# Patient Record
Sex: Male | Born: 1950
Health system: Southern US, Community
[De-identification: ages and names within clinical notes are randomized; demographics above are authoritative.]

## PROBLEM LIST (undated history)

## (undated) DIAGNOSIS — F419 Anxiety disorder, unspecified: Secondary | ICD-10-CM

## (undated) DIAGNOSIS — M545 Low back pain, unspecified: Secondary | ICD-10-CM

## (undated) DIAGNOSIS — I447 Left bundle-branch block, unspecified: Secondary | ICD-10-CM

## (undated) DIAGNOSIS — B059 Measles without complication: Secondary | ICD-10-CM

## (undated) DIAGNOSIS — I2510 Atherosclerotic heart disease of native coronary artery without angina pectoris: Secondary | ICD-10-CM

## (undated) DIAGNOSIS — I219 Acute myocardial infarction, unspecified: Secondary | ICD-10-CM

## (undated) DIAGNOSIS — I4892 Unspecified atrial flutter: Secondary | ICD-10-CM

## (undated) DIAGNOSIS — Z720 Tobacco use: Secondary | ICD-10-CM

## (undated) DIAGNOSIS — M1990 Unspecified osteoarthritis, unspecified site: Secondary | ICD-10-CM

## (undated) DIAGNOSIS — B019 Varicella without complication: Secondary | ICD-10-CM

## (undated) DIAGNOSIS — Z0000 Encounter for general adult medical examination without abnormal findings: Principal | ICD-10-CM

## (undated) DIAGNOSIS — B269 Mumps without complication: Secondary | ICD-10-CM

## (undated) DIAGNOSIS — I5042 Chronic combined systolic (congestive) and diastolic (congestive) heart failure: Secondary | ICD-10-CM

## (undated) DIAGNOSIS — E785 Hyperlipidemia, unspecified: Secondary | ICD-10-CM

## (undated) DIAGNOSIS — I10 Essential (primary) hypertension: Secondary | ICD-10-CM

## (undated) DIAGNOSIS — E119 Type 2 diabetes mellitus without complications: Secondary | ICD-10-CM

## (undated) DIAGNOSIS — I509 Heart failure, unspecified: Secondary | ICD-10-CM

## (undated) DIAGNOSIS — R945 Abnormal results of liver function studies: Secondary | ICD-10-CM

## (undated) DIAGNOSIS — Z95810 Presence of automatic (implantable) cardiac defibrillator: Secondary | ICD-10-CM

## (undated) HISTORY — DX: Encounter for general adult medical examination without abnormal findings: Z00.00

## (undated) HISTORY — DX: Low back pain, unspecified: M54.50

## (undated) HISTORY — DX: Heart failure, unspecified: I50.9

## (undated) HISTORY — PX: TONSILLECTOMY: SUR1361

## (undated) HISTORY — DX: Hyperlipidemia, unspecified: E78.5

## (undated) HISTORY — DX: Chronic combined systolic (congestive) and diastolic (congestive) heart failure: I50.42

## (undated) HISTORY — DX: Unspecified atrial flutter: I48.92

## (undated) HISTORY — PX: COLONOSCOPY: SHX174

## (undated) HISTORY — DX: Tobacco use: Z72.0

## (undated) HISTORY — PX: SHOULDER SURGERY: SHX246

## (undated) HISTORY — DX: Atherosclerotic heart disease of native coronary artery without angina pectoris: I25.10

## (undated) HISTORY — DX: Anxiety disorder, unspecified: F41.9

## (undated) HISTORY — DX: Left bundle-branch block, unspecified: I44.7

## (undated) HISTORY — DX: Varicella without complication: B01.9

## (undated) HISTORY — DX: Mumps without complication: B26.9

## (undated) HISTORY — DX: Measles without complication: B05.9

## (undated) HISTORY — DX: Essential (primary) hypertension: I10

## (undated) HISTORY — DX: Abnormal results of liver function studies: R94.5

## (undated) HISTORY — DX: Low back pain: M54.5

---

## 2001-08-25 ENCOUNTER — Emergency Department (HOSPITAL_COMMUNITY): Admission: EM | Admit: 2001-08-25 | Discharge: 2001-08-25 | Payer: Self-pay | Admitting: Emergency Medicine

## 2003-01-23 ENCOUNTER — Ambulatory Visit (HOSPITAL_COMMUNITY): Admission: RE | Admit: 2003-01-23 | Discharge: 2003-01-23 | Payer: Self-pay | Admitting: Orthopedic Surgery

## 2003-05-23 ENCOUNTER — Inpatient Hospital Stay (HOSPITAL_COMMUNITY): Admission: EM | Admit: 2003-05-23 | Discharge: 2003-05-25 | Payer: Self-pay

## 2004-09-15 ENCOUNTER — Emergency Department (HOSPITAL_COMMUNITY): Admission: EM | Admit: 2004-09-15 | Discharge: 2004-09-15 | Payer: Self-pay | Admitting: Emergency Medicine

## 2004-11-25 ENCOUNTER — Ambulatory Visit: Payer: Self-pay | Admitting: Gastroenterology

## 2004-12-30 ENCOUNTER — Ambulatory Visit: Payer: Self-pay | Admitting: Gastroenterology

## 2004-12-30 ENCOUNTER — Encounter (INDEPENDENT_AMBULATORY_CARE_PROVIDER_SITE_OTHER): Payer: Self-pay | Admitting: Specialist

## 2005-06-18 ENCOUNTER — Ambulatory Visit: Payer: Self-pay | Admitting: Family Medicine

## 2006-01-13 LAB — HM COLONOSCOPY: HM Colonoscopy: NORMAL

## 2006-03-24 ENCOUNTER — Ambulatory Visit: Payer: Self-pay | Admitting: Family Medicine

## 2006-08-04 ENCOUNTER — Ambulatory Visit: Payer: Self-pay | Admitting: Family Medicine

## 2006-09-04 ENCOUNTER — Ambulatory Visit: Payer: Self-pay | Admitting: Family Medicine

## 2006-12-07 ENCOUNTER — Ambulatory Visit: Payer: Self-pay | Admitting: Family Medicine

## 2006-12-08 ENCOUNTER — Ambulatory Visit: Payer: Self-pay | Admitting: Cardiology

## 2006-12-09 ENCOUNTER — Ambulatory Visit: Payer: Self-pay

## 2006-12-21 ENCOUNTER — Ambulatory Visit: Payer: Self-pay | Admitting: Cardiology

## 2006-12-21 LAB — CONVERTED CEMR LAB
Calcium: 9.8 mg/dL (ref 8.4–10.5)
Chloride: 98 meq/L (ref 96–112)
Eosinophils Absolute: 0.3 10*3/uL (ref 0.0–0.6)
Eosinophils Relative: 2.5 % (ref 0.0–5.0)
GFR calc non Af Amer: 73 mL/min
Glucose, Bld: 154 mg/dL — ABNORMAL HIGH (ref 70–99)
INR: 1.1 — ABNORMAL HIGH (ref 0.8–1.0)
MCV: 87.4 fL (ref 78.0–100.0)
Platelets: 233 10*3/uL (ref 150–400)
Prothrombin Time: 12.6 s (ref 10.9–13.3)
RBC: 5.2 M/uL (ref 4.22–5.81)
RDW: 13 % (ref 11.5–14.6)
WBC: 11.4 10*3/uL — ABNORMAL HIGH (ref 4.5–10.5)
aPTT: 26.4 s (ref 21.7–29.8)

## 2006-12-24 ENCOUNTER — Inpatient Hospital Stay (HOSPITAL_BASED_OUTPATIENT_CLINIC_OR_DEPARTMENT_OTHER): Admission: RE | Admit: 2006-12-24 | Discharge: 2006-12-24 | Payer: Self-pay | Admitting: Cardiovascular Disease

## 2006-12-24 ENCOUNTER — Ambulatory Visit: Payer: Self-pay | Admitting: Cardiovascular Disease

## 2006-12-31 ENCOUNTER — Ambulatory Visit: Payer: Self-pay | Admitting: Cardiology

## 2007-01-05 ENCOUNTER — Ambulatory Visit: Payer: Self-pay | Admitting: Cardiology

## 2007-01-14 DIAGNOSIS — I219 Acute myocardial infarction, unspecified: Secondary | ICD-10-CM

## 2007-01-14 HISTORY — DX: Acute myocardial infarction, unspecified: I21.9

## 2007-01-14 HISTORY — PX: CORONARY ARTERY BYPASS GRAFT: SHX141

## 2007-01-14 HISTORY — PX: CARDIAC CATHETERIZATION: SHX172

## 2007-01-19 ENCOUNTER — Ambulatory Visit: Payer: Self-pay | Admitting: Thoracic Surgery (Cardiothoracic Vascular Surgery)

## 2007-01-22 ENCOUNTER — Ambulatory Visit (HOSPITAL_COMMUNITY)
Admission: RE | Admit: 2007-01-22 | Discharge: 2007-01-22 | Payer: Self-pay | Admitting: Thoracic Surgery (Cardiothoracic Vascular Surgery)

## 2007-01-22 ENCOUNTER — Encounter: Payer: Self-pay | Admitting: Thoracic Surgery (Cardiothoracic Vascular Surgery)

## 2007-01-26 ENCOUNTER — Ambulatory Visit: Payer: Self-pay | Admitting: Thoracic Surgery (Cardiothoracic Vascular Surgery)

## 2007-01-26 ENCOUNTER — Inpatient Hospital Stay (HOSPITAL_COMMUNITY)
Admission: RE | Admit: 2007-01-26 | Discharge: 2007-01-29 | Payer: Self-pay | Admitting: Thoracic Surgery (Cardiothoracic Vascular Surgery)

## 2007-02-15 ENCOUNTER — Encounter
Admission: RE | Admit: 2007-02-15 | Discharge: 2007-02-15 | Payer: Self-pay | Admitting: Thoracic Surgery (Cardiothoracic Vascular Surgery)

## 2007-02-15 ENCOUNTER — Ambulatory Visit: Payer: Self-pay | Admitting: Thoracic Surgery (Cardiothoracic Vascular Surgery)

## 2007-02-15 ENCOUNTER — Ambulatory Visit: Payer: Self-pay | Admitting: Cardiology

## 2007-03-25 ENCOUNTER — Ambulatory Visit: Payer: Self-pay | Admitting: Cardiology

## 2007-03-25 LAB — CONVERTED CEMR LAB
BUN: 10 mg/dL (ref 6–23)
Bilirubin, Direct: 0.1 mg/dL (ref 0.0–0.3)
CO2: 29 meq/L (ref 19–32)
Direct LDL: 56.8 mg/dL
GFR calc Af Amer: 111 mL/min
Glucose, Bld: 122 mg/dL — ABNORMAL HIGH (ref 70–99)
HDL: 30.3 mg/dL — ABNORMAL LOW (ref >39.0)
Potassium: 3.3 meq/L — ABNORMAL LOW (ref 3.5–5.1)
Total Protein: 6.5 g/dL (ref 6.0–8.3)
Triglycerides: 226 mg/dL (ref 0–149)

## 2007-03-26 ENCOUNTER — Ambulatory Visit: Payer: Self-pay | Admitting: Cardiology

## 2007-04-27 ENCOUNTER — Ambulatory Visit: Payer: Self-pay | Admitting: Thoracic Surgery (Cardiothoracic Vascular Surgery)

## 2007-05-10 ENCOUNTER — Ambulatory Visit: Payer: Self-pay | Admitting: Cardiology

## 2007-06-01 ENCOUNTER — Ambulatory Visit: Payer: Self-pay | Admitting: Family Medicine

## 2007-07-05 ENCOUNTER — Ambulatory Visit: Payer: Self-pay | Admitting: Family Medicine

## 2007-11-18 ENCOUNTER — Ambulatory Visit: Payer: Self-pay | Admitting: Family Medicine

## 2008-05-01 ENCOUNTER — Ambulatory Visit: Payer: Self-pay | Admitting: Family Medicine

## 2008-05-16 IMAGING — CT CT CHEST W/ CM
2 of 4 series · 15 of 36 positions shown, 18 images · IV contrast (Omnipaque 300)
Comparison: None.

CLINICAL DATA: Right upper lobe nodule.   
CHEST CT WITH CONTRAST:
TECHNIQUE: Multidetector CT imaging of the chest was performed following the standard protocol during bolus administration of intravenous contrast.
Contrast:  80 cc Omnipaque 300.

[Series 2: chest_routine 5.0 b40f st · axial · 0.78mm/px · z∈[-326,-52]mm · 12 of 66 slices shown, 15 images]
[im 6/66  mediastinal]
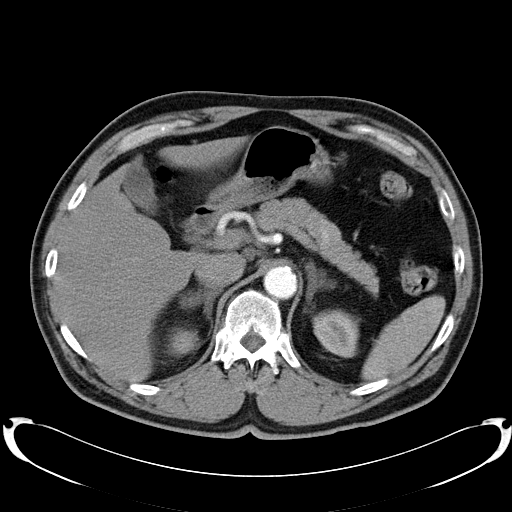
[im 6/66  lung]
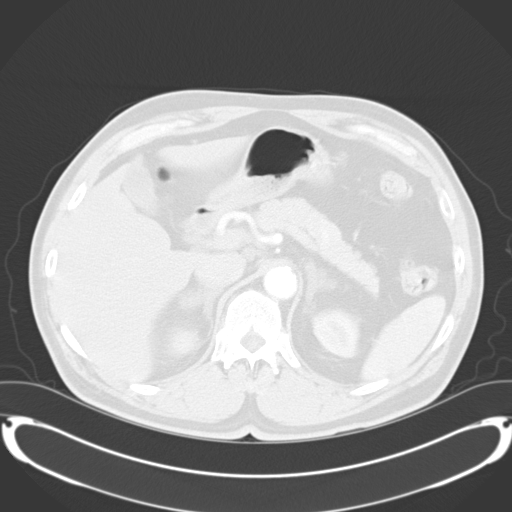
[im 11/66  lung]
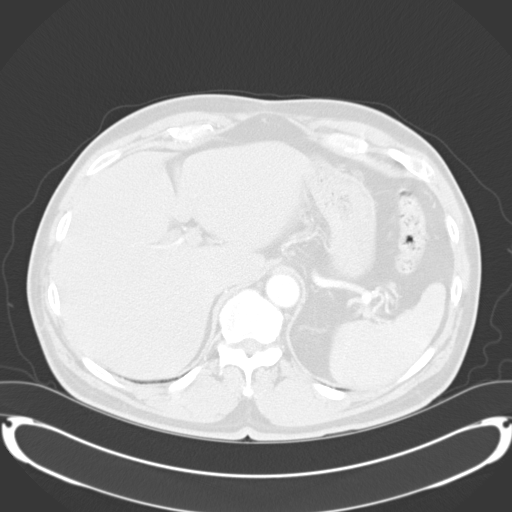
[im 16/66  lung]
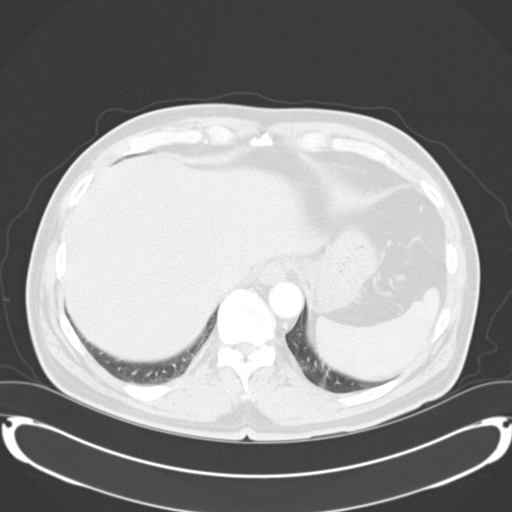
[im 21/66  lung]
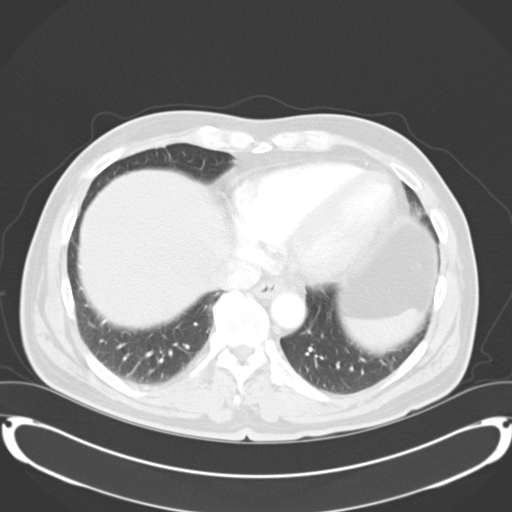
[im 26/66  mediastinal]
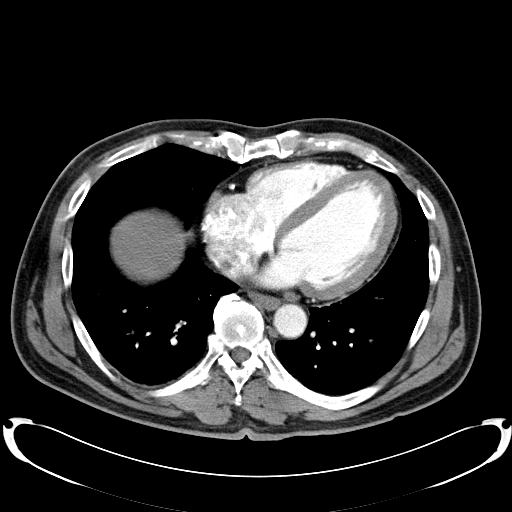
[im 26/66  lung]
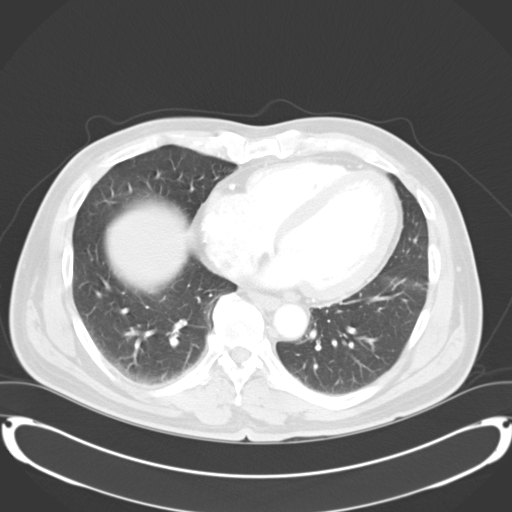
[im 31/66  lung]
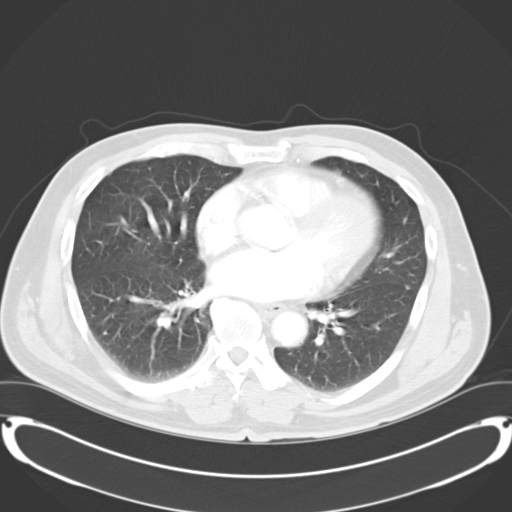
[im 36/66  lung]
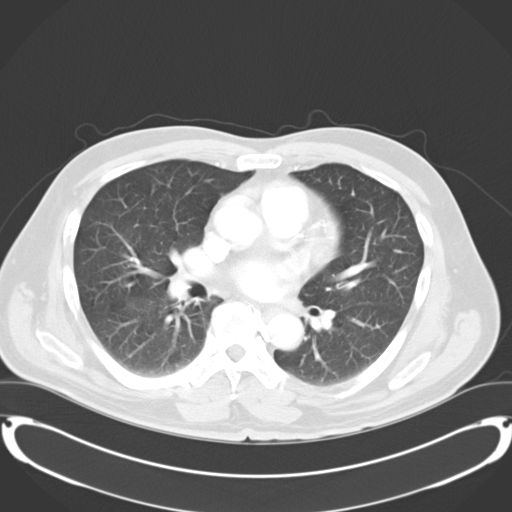
[im 41/66  lung]
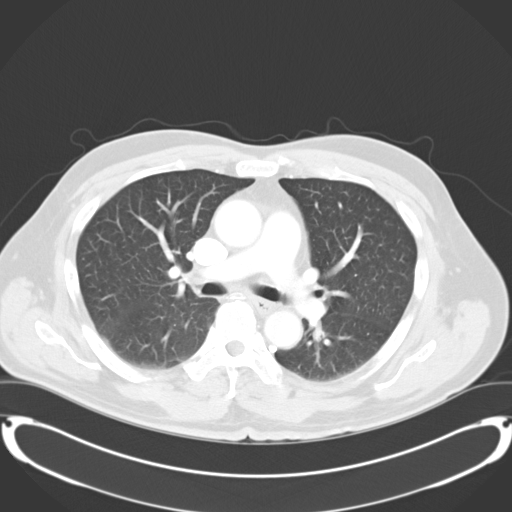
[im 46/66  mediastinal]
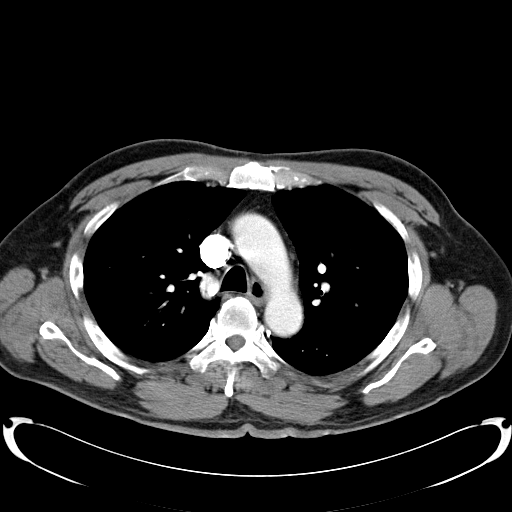
[im 46/66  lung]
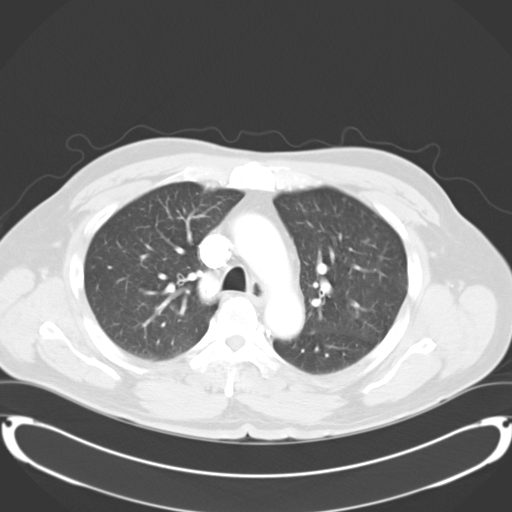
[im 51/66  lung]
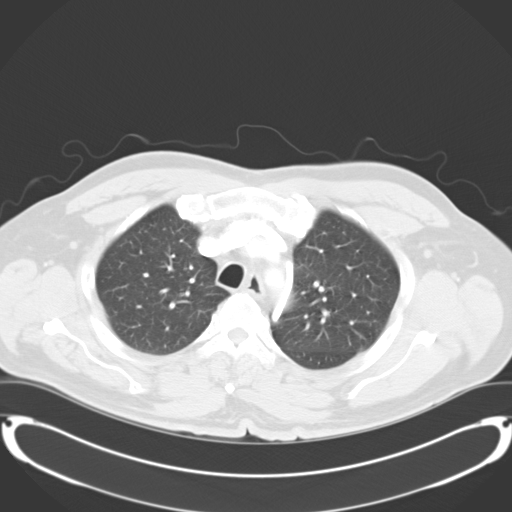
[im 56/66  lung]
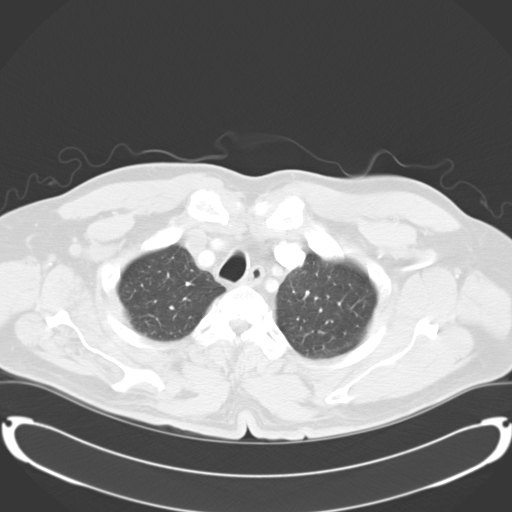
[im 61/66  lung]
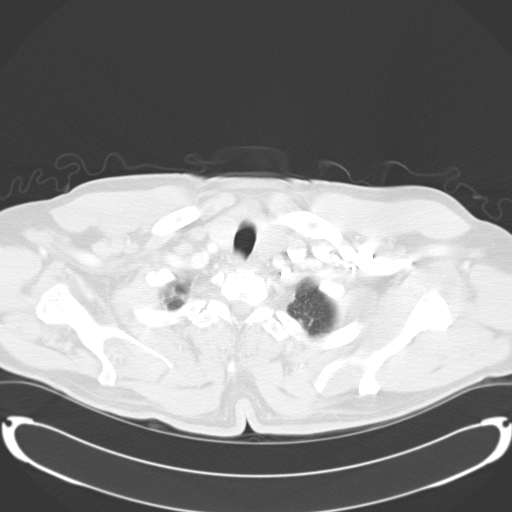

[Series 602: <mpr range> · coronal · 0.78mm/px · 3 of 129 slices shown]
[im 26/129  lung]
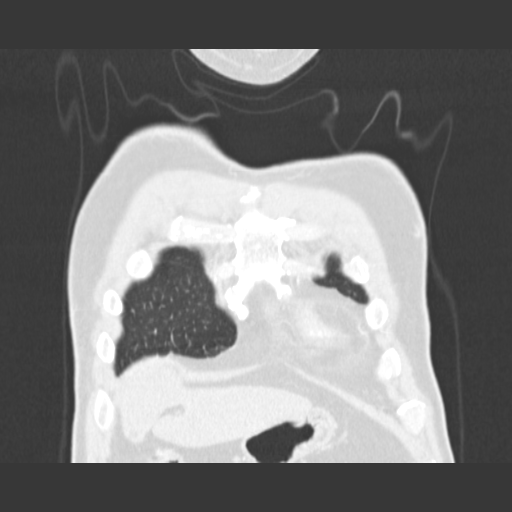
[im 52/129  lung]
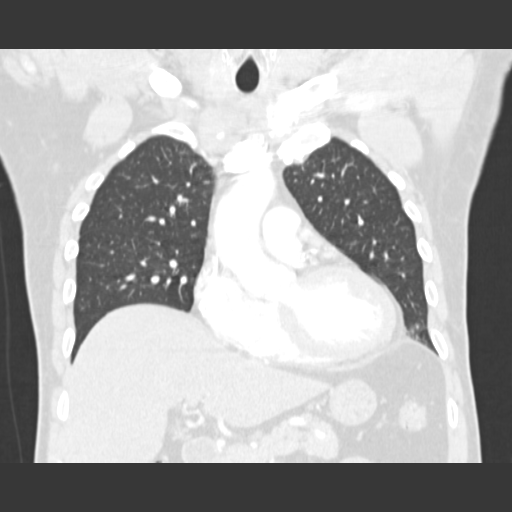
[im 77/129  lung]
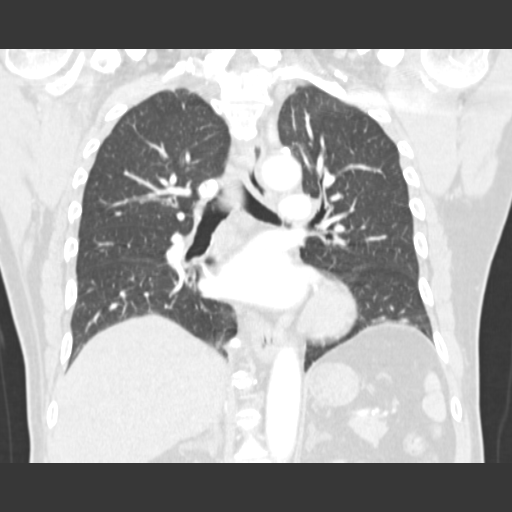

[15 of 36 positions shown; findings below may reference images not displayed]

FINDINGS: The thyroid gland enhances homogeneously.  
There are no enlarged axillary lymph nodes.  
No enlarged supraclavicular lymph nodes.  
No enlarged mediastinal or hilar lymph nodes.  
There is no pericardial or pleural fluid.  
There are 2 small pulmonary nodules in the right middle lobe; the largest measures 4.8 mm, image 29.  Subpleural nodule in the right upper lobe measures 5.7 mm.  
Within the left lung, there is a subpleural nodule which measures 4.2 mm, image 37.  
Review of the bone windows show no lytic or sclerotic lesions.  
There is mild multilevel thoracic spondylosis. 
Limited imaging through the upper abdomen shows a fat density nodule within the left adrenal gland measuring 2.1 mm.  this is consistent with a benign adrenal myelolipoma.
IMPRESSION: Nonspecific pulmonary nodules are identified within the right and left lung.  As the patient is a smoker, I would recommend a follow-up non-contrast CT of the chest beginning in 6 months.  If there is no change in these nodules, then the next follow-up exam should be performed in 18 months.

## 2008-05-22 ENCOUNTER — Encounter: Payer: Self-pay | Admitting: Cardiology

## 2008-06-11 IMAGING — CR DG CHEST 1V PORT
1 series · 1 of 1 positions shown · non-contrast
Comparison: 01/22/07.

CLINICAL DATA: Coronary artery disease.
 PORTABLE CHEST - 1 VIEW - 01/26/07 AT 9155 HOURS:

[AP]
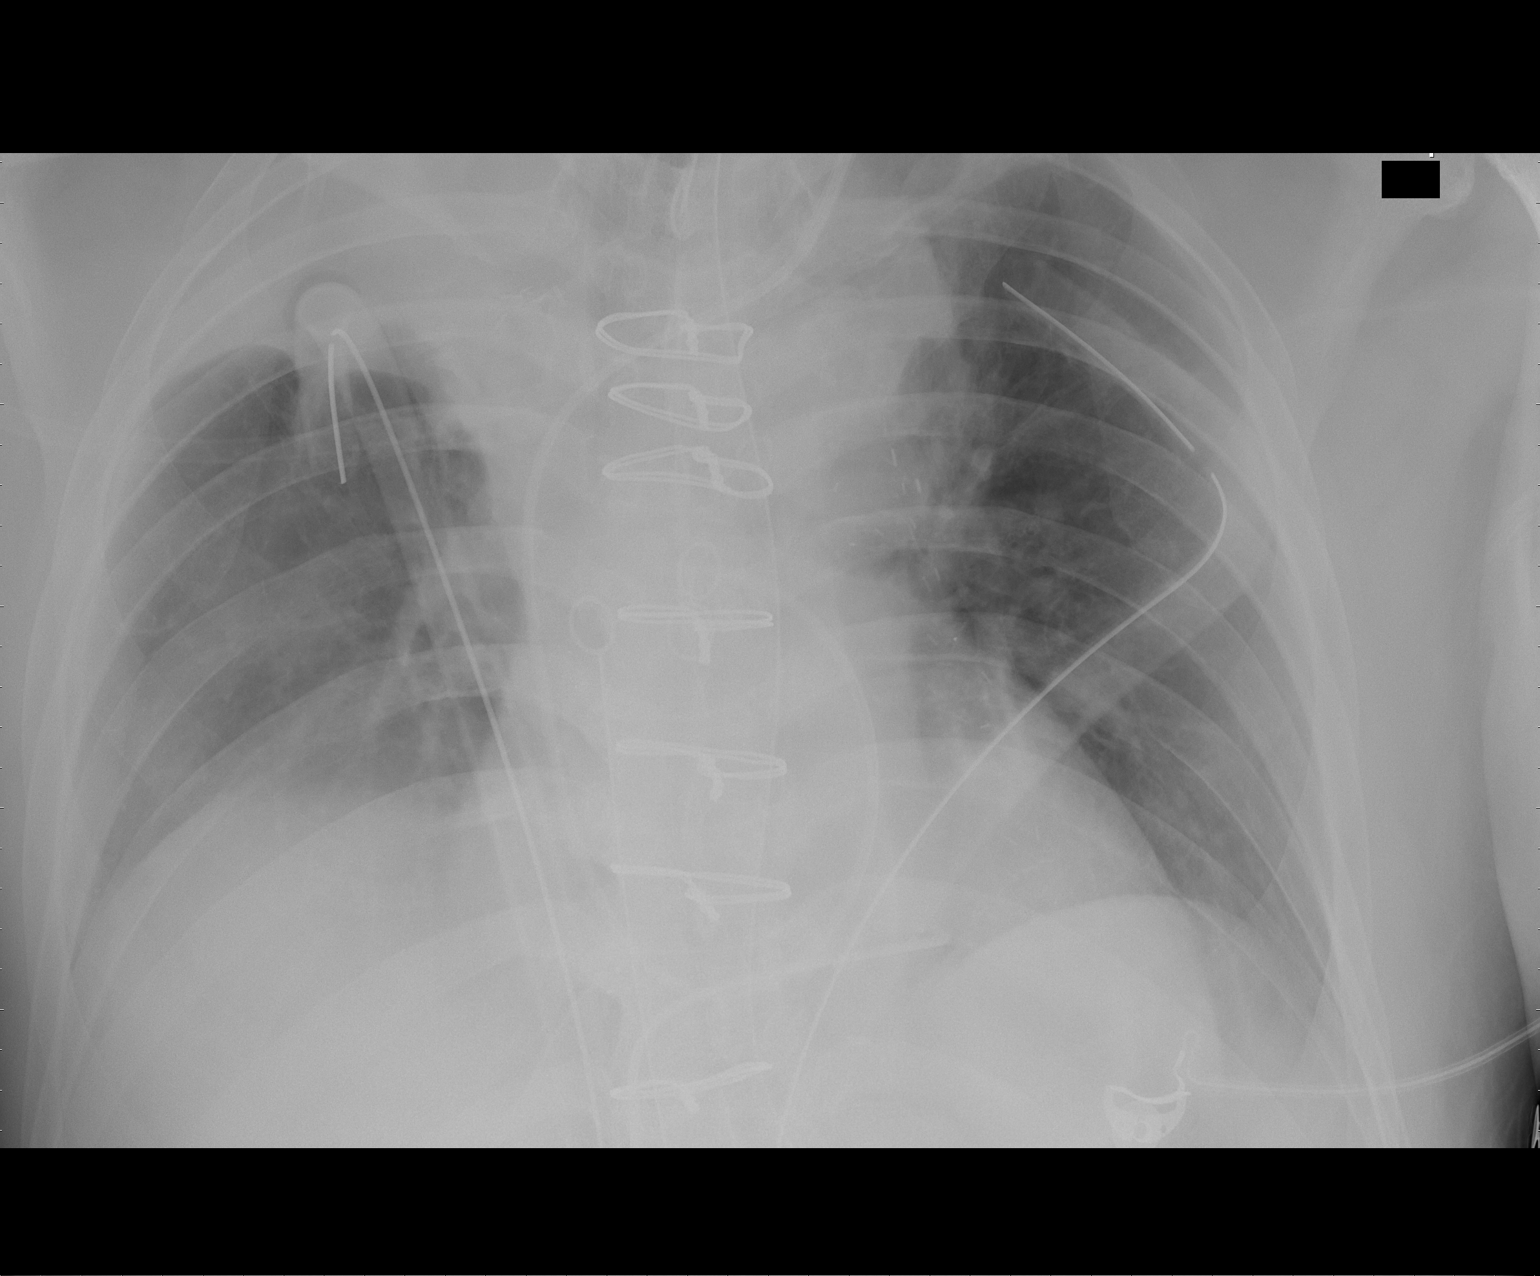

[1 of 1 positions shown; findings below may reference images not displayed]

FINDINGS: Right upper lobe collapse is present.  Chest tubes are seen bilaterally.  There is no pneumothorax.  An endotracheal tube tip is 6 cm from the carina.  A left IJ vein introducer sheath and Swan-Ganz catheter tip is in the central right pulmonary artery.  A nasogastric tube is beyond the gastroesophageal junction.  Pulmonary vascularity is within normal limits. The heart is normal in size.
IMPRESSION: Postoperative change.  Tubular structures in satisfactory position.  Right upper lobe collapse.  No pneumothorax or CHF.

## 2009-09-07 ENCOUNTER — Encounter: Payer: Self-pay | Admitting: Cardiology

## 2009-09-07 ENCOUNTER — Ambulatory Visit: Payer: Self-pay | Admitting: Family Medicine

## 2009-09-10 ENCOUNTER — Telehealth: Payer: Self-pay | Admitting: Cardiology

## 2009-09-10 DIAGNOSIS — F17200 Nicotine dependence, unspecified, uncomplicated: Secondary | ICD-10-CM | POA: Insufficient documentation

## 2009-09-10 DIAGNOSIS — E785 Hyperlipidemia, unspecified: Secondary | ICD-10-CM

## 2009-10-04 ENCOUNTER — Ambulatory Visit: Payer: Self-pay | Admitting: Cardiology

## 2009-10-04 DIAGNOSIS — I10 Essential (primary) hypertension: Secondary | ICD-10-CM

## 2010-01-15 ENCOUNTER — Ambulatory Visit
Admission: RE | Admit: 2010-01-15 | Discharge: 2010-01-15 | Payer: Self-pay | Source: Home / Self Care | Attending: Internal Medicine | Admitting: Internal Medicine

## 2010-01-15 DIAGNOSIS — F411 Generalized anxiety disorder: Secondary | ICD-10-CM | POA: Insufficient documentation

## 2010-01-15 DIAGNOSIS — M545 Low back pain, unspecified: Secondary | ICD-10-CM | POA: Insufficient documentation

## 2010-01-28 ENCOUNTER — Ambulatory Visit: Admit: 2010-01-28 | Payer: Self-pay | Admitting: Family Medicine

## 2010-01-29 ENCOUNTER — Encounter: Payer: Self-pay | Admitting: Gastroenterology

## 2010-01-31 ENCOUNTER — Telehealth (INDEPENDENT_AMBULATORY_CARE_PROVIDER_SITE_OTHER): Payer: Self-pay | Admitting: *Deleted

## 2010-02-01 ENCOUNTER — Telehealth: Payer: Self-pay | Admitting: Internal Medicine

## 2010-02-11 ENCOUNTER — Ambulatory Visit
Admission: RE | Admit: 2010-02-11 | Discharge: 2010-02-11 | Payer: Self-pay | Source: Home / Self Care | Attending: Internal Medicine | Admitting: Internal Medicine

## 2010-02-14 NOTE — Progress Notes (Signed)
Summary: Niacin  Phone Note Call from Patient Call back at [facility] Phone [phone]   Summary of Call: Pt accidently purchased niacin 500mg  caps. He usually takes 250 once daily. Is it ok to take increased dose?  Initial call taken by: Lamar Sprinkles, CMA,  February 01, 2010 4:57 PM  Follow-up for Phone Call        yes Follow-up by: Etta Grandchild MD,  February 01, 2010 5:00 PM  Additional Follow-up for Phone Call Additional follow up Details #1::        Pt informed  Additional Follow-up by: Lamar Sprinkles, CMA,  February 01, 2010 5:49 PM

## 2010-02-14 NOTE — Progress Notes (Signed)
Summary: DOD Call--abnormal EKG   Phone Note From Other Clinic   Caller: Sharlot Gowda, MD. Request: Talk with Larry Rangel Summary of Call: Dr. Susann Givens called about Mr. Pettey, patient of Dr. Daleen Squibb.  The patient is doing fine. He was concerned about ECG.  Has the appearance of LVH with repole changes, but cannot exclude ischemia. He requested we get him in promptly to see Dr. Daleen Squibb.    Arturo Morton. Riley Kill, MD, [facility].    Follow-up for Phone Call        Appt arranged on 09/12/09 at 3:45.  Left message for pt to call back. Julieta Gutting, RN, BSN  September 10, 2009 4:06 PM  I spoke with the pt and made him aware of appt on 09/12/09.  The pt said he could not come into the office until the end of September due to his job.  I made the pt aware that his EKG did show changes and he needs further evaluation by Dr Daleen Squibb.  The pt once again said he could not come into the office until around September 23rd.  I scheduled the pt to see Dr Daleen Squibb on 10/04/09 at 3:00.  I instructed the pt to monitor his BP at home and brings those readings into his appt.  I also instructed the pt to call our office if he had any problems prior to appt.  Pt agreed with plan.  Follow-up by: Julieta Gutting, RN, BSN,  September 11, 2009 9:31 AM     Appended Document: DOD Call--abnormal EKG EKG reviewed. Will call Dr Susann Givens.

## 2010-02-14 NOTE — Assessment & Plan Note (Signed)
Summary: new pt/bcbs/#/lb   Vital Signs:  Patient profile:   60 year old male Height:      73.5 inches Weight:      229 pounds BMI:     29.91 O2 Sat:      96 % on Room air Temp:     97.1 degrees F oral Pulse rate:   53 / minute Pulse rhythm:   regular Resp:     16 per minute BP sitting:   148 / 78  (left arm) Cuff size:   large  Vitals Entered By: Rock Nephew CMA (January 15, 2010 10:51 AM)  Nutrition Counseling: Patient's BMI is greater than 25 and therefore counseled on weight management options.  O2 Flow:  Room air CC: New to establish, Preventive Care, Hypertension Management Is Patient Diabetic? No Pain Assessment Patient in pain? no       Does patient need assistance? Functional Status Self care Ambulation Normal   Primary Care Provider:  Sharlot Gowda  CC:  New to establish, Preventive Care, and Hypertension Management.  History of Present Illness: New to me he is looking for a new PCP. He tells me that he had a complete physical in Sept. 2011 but he has no records with him today. He has been concerned that his BP has been high, 150-160/90, on his monitor at home.  Also, he occasionally has spasms in his low back and tells me that when it is severe the only thing that will help is flexeril and xanax. He takes Klonopin as needed for anxiety.  Hypertension History:      He denies headache, chest pain, palpitations, dyspnea with exertion, orthopnea, PND, peripheral edema, visual symptoms, neurologic problems, syncope, and side effects from treatment.  He notes no problems with any antihypertensive medication side effects.        Positive major cardiovascular risk factors include male age 51 years old or older, hyperlipidemia, hypertension, and current tobacco user.  Negative major cardiovascular risk factors include negative family history for ischemic heart disease.        Positive history for target organ damage include ASHD (either angina/prior MI/prior CABG).   Further assessment for target organ damage reveals no history of cardiac end-organ damage (CHF/LVH), stroke/TIA, peripheral vascular disease, renal insufficiency, or hypertensive retinopathy.     Preventive Screening-Counseling & Management  Alcohol-Tobacco     Alcohol drinks/day: 3     Alcohol type: wine     >5/day in last 3 mos: no     Alcohol Counseling: to decrease amount and/or frequency of alcohol intake     Feels need to cut down: no     Feels annoyed by complaints: no     Feels guilty re: drinking: no     Needs 'eye opener' in am: no     Smoking Status: current     Smoking Cessation Counseling: yes     Smoke Cessation Stage: precontemplative     Year Started: 1970     Pack years: 12     Tobacco Counseling: to quit use of tobacco products  Hep-HIV-STD-Contraception     Hepatitis Risk: no risk noted     HIV Risk: no risk noted     STD Risk: no risk noted      Sexual History:  currently monogamous.        Drug Use:  never.        Blood Transfusions:  no.    Clinical Review Panels:  Prevention  Last Colonoscopy:  normal (01/13/2006)  Immunizations   Last Tetanus Booster:  Historical (01/13/2009)  Lipid Management   Cholesterol:  112 (03/25/2007)   LDL (bad choesterol):  DEL (03/25/2007)   HDL (good cholesterol):  30.3 (03/25/2007)  Diabetes Management   Creatinine:  0.9 (03/25/2007)  CBC   WBC:  11.4 (12/21/2006)   RBC:  5.20 (12/21/2006)   Hgb:  15.9 (12/21/2006)   Hct:  45.5 (12/21/2006)   Platelets:  233 (12/21/2006)   MCV  87.4 (12/21/2006)   MCHC  34.9 (12/21/2006)   RDW  13.0 (12/21/2006)   PMN:  69.0 (12/21/2006)   Lymphs:  23.3 (12/21/2006)   Monos:  4.8 (12/21/2006)   Eosinophils:  2.5 (12/21/2006)   Basophil:  0.4 (12/21/2006)  Complete Metabolic Panel   Glucose:  122 (03/25/2007)   Sodium:  142 (03/25/2007)   Potassium:  3.3 (03/25/2007)   Chloride:  106 (03/25/2007)   CO2:  29 (03/25/2007)   BUN:  10 (03/25/2007)   Creatinine:   0.9 (03/25/2007)   Albumin:  3.9 (03/25/2007)   Total Protein:  6.5 (03/25/2007)   Calcium:  9.6 (03/25/2007)   Total Bili:  0.7 (03/25/2007)   Alk Phos:  82 (03/25/2007)   SGPT (ALT):  29 (03/25/2007)   SGOT (AST):  24 (03/25/2007)   Current Medications (verified): 1)  Atenolol 50 Mg Tabs (Atenolol) .Marland Kitchen.. 1 Tab Two Times A Day 2)  Aspirin Ec 325 Mg Tbec (Aspirin) .... Take One Tablet By Mouth Daily 3)  Simvastatin 20 Mg Tabs (Simvastatin) .... 1/2 Tab At Bedtime 4)  Verapamil Hcl Cr 360 Mg Xr24h-Cap (Verapamil Hcl) .Marland Kitchen.. 1 Cap Once Daily 5)  Viagra 50 Mg Tabs (Sildenafil Citrate) .... Take As Directed As Needed 6)  Klonopin 0.5 Mg Tabs (Clonazepam) .... As Needed 7)  Buspirone Hcl 10 Mg Tabs (Buspirone Hcl) .Marland Kitchen.. 1 Tab Once Daily 8)  Niacin Cr 250 Mg Cr-Caps (Niacin) .Marland Kitchen.. 1 Cap Once Daily  Allergies (verified): No Known Drug Allergies  Past History:  Past Medical History: CAD, ARTERY BYPASS GRAFT (ICD-414.04) HYPERLIPIDEMIA-MIXED (ICD-272.4) TOBACCO USER (ICD-305.1) Low back pain Hypertension Anxiety  Past Surgical History: Reviewed history from 09/10/2009 and no changes required. CABG x 5...2009 Shoulder surgery  Social History: Smoking Status:  current Hepatitis Risk:  no risk noted HIV Risk:  no risk noted STD Risk:  no risk noted Sexual History:  currently monogamous Drug Use:  never Blood Transfusions:  no  Review of Systems       The patient complains of weight gain.  The patient denies anorexia, fever, weight loss, hoarseness, chest pain, syncope, dyspnea on exertion, peripheral edema, prolonged cough, headaches, hemoptysis, abdominal pain, hematuria, difficulty walking, depression, unusual weight change, abnormal bleeding, and enlarged lymph nodes.   MS:  Complains of low back pain; denies joint pain, joint redness, joint swelling, loss of strength, muscle aches, muscle, muscle weakness, and stiffness. Psych:  Complains of anxiety; denies alternate  hallucination ( auditory/visual), depression, easily angered, easily tearful, irritability, mental problems, panic attacks, sense of great danger, suicidal thoughts/plans, thoughts of violence, unusual visions or sounds, and thoughts /plans of harming others.  Physical Exam  General:  alert, well-developed, well-nourished, well-hydrated, appropriate dress, normal appearance, healthy-appearing, cooperative to examination, and good hygiene.   Head:  normocephalic, atraumatic, no abnormalities observed, and no abnormalities palpated.   Eyes:  vision grossly intact, pupils equal, pupils round, and pupils reactive to light.   Mouth:  Oral mucosa and oropharynx without lesions or exudates.  Teeth in good repair. Neck:  supple, full ROM, no masses, no thyromegaly, no thyroid nodules or tenderness, no JVD, normal carotid upstroke, no carotid bruits, no cervical lymphadenopathy, and no neck tenderness.   Lungs:  normal respiratory effort, no intercostal retractions, no accessory muscle use, normal breath sounds, no dullness, no fremitus, no crackles, and no wheezes.   Heart:  normal rate, regular rhythm, no murmur, no gallop, no rub, and no JVD.   Abdomen:  soft, non-tender, normal bowel sounds, no distention, no masses, no guarding, no rigidity, no rebound tenderness, no abdominal hernia, no inguinal hernia, no hepatomegaly, and no splenomegaly.   Msk:  No deformity or scoliosis noted of thoracic or lumbar spine.     Detailed Back/Spine Exam  General:    Well-developed, well-nourished, in no acute distress; alert and oriented x 3.    Gait:    Normal heel-toe gait pattern bilaterally.    Lumbosacral Exam:  Inspection-deformity:    Normal Palpation-spinal tenderness:  Normal Range of Motion:    Forward Flexion:   90 degrees    Hyperextension:   35 degrees    Right Lateral Bend:   30 degrees    Left Lateral Bend:   35 degrees Squatting:  normal Lying Straight Leg Raise:    Right:  negative     Left:  negative Sitting Straight Leg Raise:    Right:  negative    Left:  negative Reverse Straight Leg Raise:    Right:  negative    Left:  negative Contralateral Straight Leg Raise:    Right:  negative    Left:  negative Sciatic Notch:    There is no sciatic notch tenderness. Toe Walking:    Right:  normal    Left:  normal Heel Walking:    Right:  normal    Left:  normal   Impression & Recommendations:  Problem # 1:  HYPERTENSION (ICD-401.9) Assessment Deteriorated  The following medications were removed from the medication list:    Verapamil Hcl Cr 360 Mg Xr24h-cap (Verapamil hcl) .Marland Kitchen... 1 cap once daily His updated medication list for this problem includes:    Atenolol 50 Mg Tabs (Atenolol) .Marland Kitchen... 1 tab two times a day    Azor 5-40 Mg Tabs (Amlodipine-olmesartan) ..... One by mouth once daily for high blood pressure  BP today: 148/78 Prior BP: 164/90 (10/04/2009)  10 Yr Risk Heart Disease: N/A  Labs Reviewed: K+: 3.3 (03/25/2007) Creat: : 0.9 (03/25/2007)   Chol: 112 (03/25/2007)   HDL: 30.3 (03/25/2007)   LDL: DEL (03/25/2007)   TG: 226 (03/25/2007)  Problem # 2:  LOW BACK PAIN (ICD-724.2) Assessment: Unchanged  His updated medication list for this problem includes:    Aspirin Ec 325 Mg Tbec (Aspirin) .Marland Kitchen... Take one tablet by mouth daily    Cyclobenzaprine Hcl 10 Mg Tabs (Cyclobenzaprine hcl) ..... One by mouth three times a day as needed for back spasm  Discussed use of moist heat or ice, modified activities, medications, and stretching/strengthening exercises. Back care instructions given. To be seen in 2 weeks if no improvement; sooner if worsening of symptoms.   Problem # 3:  HYPERLIPIDEMIA-MIXED (ICD-272.4) Assessment: Unchanged  His updated medication list for this problem includes:    Simvastatin 20 Mg Tabs (Simvastatin) .Marland Kitchen... 1/2 tab at bedtime    Niacin Cr 250 Mg Cr-caps (Niacin) .Marland Kitchen... 1 cap once daily  Problem # 4:  ANXIETY  (ICD-300.00) Assessment: Unchanged  His updated medication list for this problem includes:  Klonopin 0.5 Mg Tabs (Clonazepam) ..... One by mouth two times a day as needed for anxiety    Buspirone Hcl 10 Mg Tabs (Buspirone hcl) .Marland Kitchen... 1 tab once daily    Xanax 1 Mg Tabs (Alprazolam) ..... One by mouth once daily as needed for back spasm  Problem # 5:  TOBACCO USE (ICD-305.1) Assessment: Unchanged  Encouraged smoking cessation and discussed different methods for smoking cessation.   Complete Medication List: 1)  Atenolol 50 Mg Tabs (Atenolol) .Marland Kitchen.. 1 tab two times a day 2)  Aspirin Ec 325 Mg Tbec (Aspirin) .... Take one tablet by mouth daily 3)  Simvastatin 20 Mg Tabs (Simvastatin) .... 1/2 tab at bedtime 4)  Viagra 50 Mg Tabs (Sildenafil citrate) .... Take as directed as needed 5)  Klonopin 0.5 Mg Tabs (Clonazepam) .... One by mouth two times a day as needed for anxiety 6)  Buspirone Hcl 10 Mg Tabs (Buspirone hcl) .Marland Kitchen.. 1 tab once daily 7)  Niacin Cr 250 Mg Cr-caps (Niacin) .Marland Kitchen.. 1 cap once daily 8)  Cyclobenzaprine Hcl 10 Mg Tabs (Cyclobenzaprine hcl) .... One by mouth three times a day as needed for back spasm 9)  Azor 5-40 Mg Tabs (Amlodipine-olmesartan) .... One by mouth once daily for high blood pressure 10)  Xanax 1 Mg Tabs (Alprazolam) .... One by mouth once daily as needed for back spasm  Hypertension Assessment/Plan:      The patient's hypertensive risk group is category C: Target organ damage and/or diabetes.  Today's blood pressure is 148/78.  His blood pressure goal is < 140/90.  Immunization & Chemoprophylaxis:    Tetanus vaccine: Historical  (01/13/2009)  Patient Instructions: 1)  Please schedule a follow-up appointment in 1 month. 2)  Tobacco is very bad for your health and your loved ones! You Should stop smoking!. 3)  Stop Smoking Tips: Choose a Quit date. Cut down before the Quit date. decide what you will do as a substitute when you feel the urge to  smoke(gum,toothpick,exercise). 4)  It is important that you exercise regularly at least 20 minutes 5 times a week. If you develop chest pain, have severe difficulty breathing, or feel very tired , stop exercising immediately and seek medical attention. 5)  Check your Blood Pressure regularly. If it is above 130/80: you should make an appointment. Prescriptions: CYCLOBENZAPRINE HCL 10 MG TABS (CYCLOBENZAPRINE HCL) One by mouth three times a day as needed for back spasm  #50 x 5   Entered by:   Lamar Sprinkles, CMA   Authorized by:   Etta Grandchild MD   Signed by:   Lamar Sprinkles, CMA on 01/15/2010   Method used:   Reprint   RxID:   [id number] KLONOPIN 0.5 MG TABS (CLONAZEPAM) One by mouth two times a day as needed for anxiety  #60 x 5   Entered by:   Lamar Sprinkles, CMA   Authorized by:   Etta Grandchild MD   Signed by:   Lamar Sprinkles, CMA on 01/15/2010   Method used:   Reprint   RxID:   [id number] XANAX 1 MG TABS (ALPRAZOLAM) One by mouth once daily as needed for back spasm  #2 x 5   Entered by:   Lamar Sprinkles, CMA   Authorized by:   Etta Grandchild MD   Signed by:   Lamar Sprinkles, CMA on 01/15/2010   Method used:   Reprint   RxID:   [id number] XANAX 1 MG TABS (ALPRAZOLAM) One by mouth once daily  as needed for back spasm  #2 x 5   Entered and Authorized by:   Etta Grandchild MD   Signed by:   Etta Grandchild MD on 01/15/2010   Method used:   Print then Give to Patient   RxID:   [id number] AZOR 5-40 MG TABS (AMLODIPINE-OLMESARTAN) One by mouth once daily for high blood pressure  #42 x 0   Entered and Authorized by:   Etta Grandchild MD   Signed by:   Etta Grandchild MD on 01/15/2010   Method used:   Samples Given   RxID:   [id number] CYCLOBENZAPRINE HCL 10 MG TABS (CYCLOBENZAPRINE HCL) One by mouth three times a day as needed for back spasm  #50 x 5   Entered and Authorized by:   Etta Grandchild MD   Signed by:   Etta Grandchild MD on 01/15/2010    Method used:   Print then Give to Patient   RxID:   [id number] KLONOPIN 0.5 MG TABS (CLONAZEPAM) One by mouth two times a day as needed for anxiety  #60 x 5   Entered and Authorized by:   Etta Grandchild MD   Signed by:   Etta Grandchild MD on 01/15/2010   Method used:   Print then Give to Patient   RxID:   [id number]    Orders Added: 1)  New Patient Level IV [88416]    Preventive Care Screening  Last Tetanus Booster:    Date:  01/13/2009    Results:  Historical   Colonoscopy:    Date:  01/13/2006    Results:  normal      Prevention & Chronic Care Immunizations   Influenza vaccine: Not documented   Influenza vaccine deferral: Refused  (01/15/2010)    Tetanus booster: 01/13/2009: Historical    Pneumococcal vaccine: Not documented    H. zoster vaccine: Not documented   H. zoster vaccine deferral: Refused  (01/15/2010)  Colorectal Screening   Hemoccult: Not documented    Colonoscopy: normal  (01/13/2006)  Other Screening   PSA: Not documented   Smoking status: current  (01/15/2010)   Smoking cessation counseling: yes  (01/15/2010)  Lipids   Total Cholesterol: 112  (03/25/2007)   LDL: DEL  (03/25/2007)   LDL Direct: 56.8  (03/25/2007)   HDL: 30.3  (03/25/2007)   Triglycerides: 226  (03/25/2007)    SGOT (AST): 24  (03/25/2007)   SGPT (ALT): 29  (03/25/2007)   Alkaline phosphatase: 82  (03/25/2007)   Total bilirubin: 0.7  (03/25/2007)  Hypertension   Last Blood Pressure: 148 / 78  (01/15/2010)   Serum creatinine: 0.9  (03/25/2007)   Serum potassium 3.3  (03/25/2007)  Self-Management Support :    Hypertension self-management support: Not documented    Lipid self-management support: Not documented

## 2010-02-14 NOTE — Letter (Signed)
Summary: Colonoscopy Letter  [facility]  [address]   [place], [place] [address]   Phone: [phone]  Fax: [phone]      January 29, 2010 MRN: [id number]   [facility] [address] [place], [place]  [address]   Dear Mr. Mentink,   According to your medical record, it is time for you to schedule a Colonoscopy. The American Cancer Society recommends this procedure as a method to detect early colon cancer. Patients with a family history of colon cancer, or a personal history of colon polyps or inflammatory bowel disease are at increased risk.  This letter has been generated based on the recommendations made at the time of your procedure. If you feel that in your particular situation this may no longer apply, please contact our office.  Please call our office at [phone] to schedule this appointment or to update your records at your earliest convenience.  Thank you for cooperating with [place] to provide you with the very best care possible.   Sincerely,   Vania Rea. Jarold Motto, M.D.  [facility] [phone]

## 2010-02-14 NOTE — Assessment & Plan Note (Signed)
Summary: abnormal EKG/lwb  Medications Added SIMVASTATIN 20 MG TABS (SIMVASTATIN) 1/2 tab at bedtime BUSPIRONE HCL 10 MG TABS (BUSPIRONE HCL) 1 tab once daily NIACIN CR 250 MG CR-CAPS (NIACIN) 1 cap once daily      Allergies Added: NKDA  Visit Type:  2 yr f/u Primary Provider:  Sharlot Gowda  CC:  pt here today for abnormal EKG that was done in Dr. Jola Babinski office 09/07/09....says he has a little chest tightness with his asthma at times....denies any other complaints today.  History of Present Illness: MMaynard returns today for further evaluation and his coronary disease. He is now 2 years out from bypass surgery.  He is remarried and is very happy. He is working at [employer]. He's having no chest pain but does have dyspnea on exertion. He still smoking some about half pack a day. He works in a Civil engineer, contracting.  He's taking his medications. His last lipids were goal as checked by his primary care. I see that he is on both her abdominal and simvastatin. Would have to lower the dose of his simvastatin to see if he can maintain goal.  Current Medications (verified): 1)  Atenolol 50 Mg Tabs (Atenolol) .Marland Kitchen.. 1 Tab Two Times A Day 2)  Aspirin Ec 325 Mg Tbec (Aspirin) .... Take One Tablet By Mouth Daily 3)  Simvastatin 20 Mg Tabs (Simvastatin) .Marland Kitchen.. 1 Tab At Bedtime 4)  Verapamil Hcl Cr 360 Mg Xr24h-Cap (Verapamil Hcl) .Marland Kitchen.. 1 Cap Once Daily 5)  Viagra 50 Mg Tabs (Sildenafil Citrate) .... Take As Directed As Needed 6)  Klonopin 0.5 Mg Tabs (Clonazepam) .... As Needed 7)  Buspirone Hcl 10 Mg Tabs (Buspirone Hcl) .Marland Kitchen.. 1 Tab Once Daily 8)  Niacin Cr 250 Mg Cr-Caps (Niacin) .Marland Kitchen.. 1 Cap Once Daily  Allergies (verified): No Known Drug Allergies  Past History:  Past Medical History: Last updated: 09/29/2009 CAD, ARTERY BYPASS GRAFT (ICD-414.04) HYPERLIPIDEMIA-MIXED (ICD-272.4) TOBACCO USER (ICD-305.1)  Past Surgical History: Last updated: 2009-09-29 CABG x 5...2009 Shoulder  surgery  Family History: Last updated: 09/29/09  Father died at age 57 of congestive heart failure and  previous implantable defibrillator.  He had a myocardial infarction and  sudden death at a fairly young age.  Mother is 81, has a history of coronary  artery disease and previous angioplasty.  He is an only child.  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  He smokes between one half to one pack of cigarettes  per day, drinks two bottles of wine per week.  He has been married four  times  previously, the first for 11 years and has two children by that  marriage which are grown, the second time for 11 years and has two children  by that marriage.  Wife has custody in [place].  A third marriage was  4-1/2 years and most recent marriage was for the past eight weeks and he is  currently estranged from that person.  There is no history of illicit drug  use.  Review of Systems       negative other than history of present illness  Vital Signs:  Patient profile:   60 year old male Height:      73.5 inches Weight:      225.8 pounds BMI:     29.49 Pulse rate:   48 / minute Pulse rhythm:  irregular BP sitting:   164 / 90  (left arm) Cuff size:   large  Vitals Entered By: Danielle Rankin, CMA (October 04, 2009 3:35 PM)  Physical Exam  General:  no acute distress, overweight Head:  normocephalic and atraumatic Eyes:  PERRLA/EOM intact; conjunctiva and lids normal. Neck:  Neck supple, no JVD. No masses, thyromegaly or abnormal cervical nodes. Chest Wall:  no deformities or breast masses noted Lungs:  as part of her expiratory rhonchi Heart:  nondisplaced, soft S1-S2, no murmur rub or gallop. Carotid upstrokes equal bilaterally without bruits Abdomen:  Bowel sounds positive; abdomen soft and non-tender without masses, organomegaly, or  hernias noted. No hepatosplenomegaly. Msk:  decreased ROM.   Pulses:  pulses normal in all 4 extremities Extremities:  No clubbing or cyanosis. Neurologic:  Alert and oriented x 3. Skin:  Intact without lesions or rashes. Psych:  Normal affect.   Problems:  Medical Problems Added: 1)  Dx of Hypertension, Unspecified  (ICD-401.9)  EKG  Procedure date:  10/04/2009  Findings:      sinus bradycardia first degree AV block, ST segment changes laterally. LVH with strain.  Impression & Recommendations:  Problem # 1:  CAD, ARTERY BYPASS GRAFT (ICD-414.04) I will arrange a stress Myoview. If negative for ischemia, we'll see him back in 2 years. His updated medication list for this problem includes:    Atenolol 50 Mg Tabs (Atenolol) .Marland Kitchen... 1 tab two times a day    Aspirin Ec 325 Mg Tbec (Aspirin) .Marland Kitchen... Take one tablet by mouth daily    Verapamil Hcl Cr 360 Mg Xr24h-cap (Verapamil hcl) .Marland Kitchen... 1 cap once daily  Orders: Nuclear Stress Test (Nuc Stress Test)  Problem # 2:  HYPERLIPIDEMIA-MIXED (ICD-272.4) Assessment: Improved Based on new guidelines, we will lower his simvastatin 10 mg per day. I will have him check blood work with his primary care in about 6 months. If his lipids are not at goal, I would advise a switched to generic Lipitor by that time. His updated medication list for this problem includes:    Simvastatin 20 Mg Tabs (Simvastatin) .Marland Kitchen... 1/2 tab at bedtime    Niacin Cr 250 Mg Cr-caps (Niacin) .Marland Kitchen... 1 cap once daily  Orders: Nuclear Stress Test (Nuc Stress Test)  Problem # 3:  TOBACCO USER (ICD-305.1) Assessment: Unchanged advised to quit  Problem # 4:  HYPERTENSION, UNSPECIFIED (ICD-401.9) Assessment: Unchanged  His updated medication list for this problem includes:    Atenolol 50 Mg Tabs (Atenolol) .Marland Kitchen... 1 tab two times a day    Aspirin Ec 325 Mg Tbec (Aspirin) .Marland Kitchen... Take one tablet by mouth daily    Verapamil Hcl Cr 360 Mg Xr24h-cap (Verapamil hcl) .Marland Kitchen... 1 cap  once daily  Clinical Reports Reviewed:  Cardiac Cath:  12/24/2006: Cardiac Cath Findings:   Left ventricular function is normal with LVEF of 60%.      ASSESSMENT:   1. Moderate left anterior descending/diagonal stenosis.   2. Moderately severe right coronary artery stenosis.   3. Mild left circumflex stenosis.   4. Normal left ventricular function.      PLAN:  Mr. Kats does not have critical CAD; however, he does have   diffuse moderate disease.  I think his most significant stenosis is in   his proximal right coronary artery.  The right coronary artery is a   diffusely diseased vessel and if treated percutaneously would require a   minimum of 2 stents.  The LAD diagonal bifurcation also has moderate  disease.  I have compared his films to his previous cath films from 2005   and the LAD diagonal bifurcation has progressed minimally.  The diagonal   ostium is slightly worse than it was previously.  The LAD lesion is   essentially unchanged.  The disease in the right coronary artery has   progressed.  It previously appeared  nonobstructive and now is in the   range of 70% to 80%.  In that setting, Mr. Stockinger has had a nonischemic   Myoview study.  He does not have classic angina.  He is also not willing   to take Plavix because he is very active and commonly sustains abrasions   and cuts because of his hard physical work.  He has been on the medicine   in the past and has had a good deal of bleeding.  I reviewed the   situation with Dr. Daleen Squibb and we will initially recommend medical therapy.   If he does not respond well, then Dr. Daleen Squibb will decide on his best   revascularization option.               Veverly Fells. Excell Seltzer, MD   05/24/2003: Cardiac Cath Findings:   ANGIOGRAPHIC DATA:  1. Left ventriculogram:  Performed in the 30 degree RAO projection.  The     aortic valve was normal.  The mitral valve appears normal.  Left     ventricle appears normal in size.  Wall motion is  normal.  Estimated     ejection fraction is 70%.  Coronary arteries arise and distribute     normally.  There is mild calcification noted involving the left coronary     system as well as the mid right coronary artery.  The left main coronary     artery is normal.  2. Left anterior descending:  There is a 30% proximal narrowing.  This is     followed by an area of segmental 60% narrowing in an angulated segment     which then gives off a diagonal branch.  There is moderate tortuosity of     the distal vessel.  The circumflex is a large vessel and the first     marginal branch has mild 20-30% proximal narrowing.  The vessel is widely     patent otherwise and also gives off a large second marginal branch.  The     right coronary artery is moderately tortuous.  There is a 40% mid vessel     stenosis with some calcification and moderate irregularity in the     remaining parts of the vessel.  The vessel terminates in a posterior     descending artery which is large and supplies the apex.   IMPRESSION:  Moderate to moderately severe three vessel coronary artery  disease with stenosis involving the proximal left anterior descending, mid  right coronary artery and mild disease involving the circumflex.   RECOMMENDATIONS:  The stenosis did not appear to be suitable for stenting  and is not diffuse enough for cardiac surgery at this time.  It is  recommended that he have intensive risk factor modification to include  cigarette cessation, control of lips and control of blood pressure.  He was  somewhat bradycardic during the procedure and his verapamil will be  discontinued and he will be placed on Norvasc for control of blood pressure.  Darden Palmer., M.D.  Carotid Doppler:  01/22/2007:    SUMMARY:   -  Vertebral artery flow antegrade bilaterally.   -  No significant right ICA stenosis noted.   -  No significant left ICA stenosis noted.      ---------------------------------------------------------------    Prepared and Electronically Authenticated by    Josephina Gip MD   Confirmed 23-Jan-2007 11:02:01  Nuclear Study:  12/09/2006:  Excerise capacity: Adenosine study with no exercise  Blood Pressure response: Normal blood pressure response  Clinical symptoms: Chest tight  ECG impression: No significant ST segment change suggestive of ischemia  Overall impression: The images suggest a mild scar at the base of the inferior wall. There is a corresponding wall motion abnormality. There is no ischemia.  Willa Rough, MD   Patient Instructions: 1)  Your physician wants you to follow-up in: 2 years with Dr Dorinda Hill will receive a reminder letter in the mail two months in advance. If you don't receive a letter, please call our office to schedule the follow-up appointment. 2)  Your physician has requested that you have an exercise stress myoview.  For further information please visit [email or web address]  Please follow instruction sheet, as given. 3)  Your physician has recommended you make the following change in your medication: decrease Simvastatin to 20mg  1/2 tablet daily

## 2010-02-14 NOTE — Progress Notes (Signed)
  Phone Note Other Incoming   Request: Send information Summary of Call: Records received from Dr. Susann Givens. 14 pages forwarded to Dr. Yetta Barre for review.

## 2010-02-20 ENCOUNTER — Telehealth: Payer: Self-pay | Admitting: Internal Medicine

## 2010-02-20 ENCOUNTER — Telehealth: Payer: Self-pay | Admitting: Cardiology

## 2010-02-20 NOTE — Assessment & Plan Note (Signed)
Summary: FU Larry Rangel   Vital Signs:  Patient profile:   60 year old male Height:      73.5 inches Weight:      231 pounds BMI:     30.17 O2 Sat:      97 % on Room air Temp:     98.2 degrees F oral Pulse rate:   55 / minute Pulse rhythm:   regular Resp:     16 per minute BP sitting:   128 / 84  (left arm) Cuff size:   large  Vitals Entered By: Rock Nephew CMA (February 11, 2010 10:13 AM)  Nutrition Counseling: Patient's BMI is greater than 25 and therefore counseled on weight management options.  O2 Flow:  Room air  Primary Care Provider:  Etta Grandchild MD   History of Present Illness:  Hypertension Follow-Up      This is a 60 year old man who presents for Hypertension follow-up.  The patient denies lightheadedness, urinary frequency, headaches, edema, impotence, rash, and fatigue.  The patient denies the following associated symptoms: chest pain, chest pressure, exercise intolerance, dyspnea, palpitations, syncope, leg edema, and pedal edema.  Compliance with medications (by patient report) has been near 100%.  The patient reports that dietary compliance has been good.  The patient reports exercising occasionally.  Adjunctive measures currently used by the patient include salt restriction and relaxation.    Preventive Screening-Counseling & Management  Alcohol-Tobacco     Alcohol drinks/day: 3     Alcohol type: wine     >5/day in last 3 mos: no     Alcohol Counseling: to decrease amount and/or frequency of alcohol intake     Feels need to cut down: no     Feels annoyed by complaints: no     Feels guilty re: drinking: no     Needs 'eye opener' in am: no     Smoking Status: current     Smoking Cessation Counseling: yes     Smoke Cessation Stage: precontemplative     Year Started: 1968     Pack years: 54     Tobacco Counseling: to quit use of tobacco products  Hep-HIV-STD-Contraception     Hepatitis Risk: no risk noted     HIV Risk: no risk noted     STD Risk: no risk  noted      Sexual History:  currently monogamous.        Drug Use:  never.        Blood Transfusions:  no.    Clinical Review Panels:  Prevention   Last Colonoscopy:  normal (01/13/2006)  Immunizations   Last Tetanus Booster:  Historical (01/13/2009)  Lipid Management   Cholesterol:  112 (03/25/2007)   LDL (bad choesterol):  DEL (03/25/2007)   HDL (good cholesterol):  30.3 (03/25/2007)  Diabetes Management   Creatinine:  0.9 (03/25/2007)  CBC   WBC:  11.4 (12/21/2006)   RBC:  5.20 (12/21/2006)   Hgb:  15.9 (12/21/2006)   Hct:  45.5 (12/21/2006)   Platelets:  233 (12/21/2006)   MCV  87.4 (12/21/2006)   MCHC  34.9 (12/21/2006)   RDW  13.0 (12/21/2006)   PMN:  69.0 (12/21/2006)   Lymphs:  23.3 (12/21/2006)   Monos:  4.8 (12/21/2006)   Eosinophils:  2.5 (12/21/2006)   Basophil:  0.4 (12/21/2006)  Complete Metabolic Panel   Glucose:  122 (03/25/2007)   Sodium:  142 (03/25/2007)   Potassium:  3.3 (03/25/2007)   Chloride:  106 (  03/25/2007)   CO2:  29 (03/25/2007)   BUN:  10 (03/25/2007)   Creatinine:  0.9 (03/25/2007)   Albumin:  3.9 (03/25/2007)   Total Protein:  6.5 (03/25/2007)   Calcium:  9.6 (03/25/2007)   Total Bili:  0.7 (03/25/2007)   Alk Phos:  82 (03/25/2007)   SGPT (ALT):  29 (03/25/2007)   SGOT (AST):  24 (03/25/2007)   Current Medications (verified): 1)  Atenolol 50 Mg Tabs (Atenolol) .Marland Kitchen.. 1 Tab Two Times A Day 2)  Aspirin Ec 325 Mg Tbec (Aspirin) .... Take One Tablet By Mouth Daily 3)  Simvastatin 20 Mg Tabs (Simvastatin) .... 1/2 Tab At Bedtime 4)  Viagra 50 Mg Tabs (Sildenafil Citrate) .... Take As Directed As Needed 5)  Klonopin 0.5 Mg Tabs (Clonazepam) .... One By Mouth Two Times A Day As Needed For Anxiety 6)  Buspirone Hcl 10 Mg Tabs (Buspirone Hcl) .Marland Kitchen.. 1 Tab Once Daily 7)  Niacin Cr 250 Mg Cr-Caps (Niacin) .Marland Kitchen.. 1 Cap Once Daily 8)  Cyclobenzaprine Hcl 10 Mg Tabs (Cyclobenzaprine Hcl) .... One By Mouth Three Times A Day As Needed For Back  Spasm 9)  Azor 5-40 Mg Tabs (Amlodipine-Olmesartan) .... One By Mouth Once Daily For High Blood Pressure 10)  Xanax 1 Mg Tabs (Alprazolam) .... One By Mouth Once Daily As Needed For Back Spasm  Allergies (verified): No Known Drug Allergies  Past History:  Past Medical History: Last updated: 01/15/2010 CAD, ARTERY BYPASS GRAFT (ICD-414.04) HYPERLIPIDEMIA-MIXED (ICD-272.4) TOBACCO USER (ICD-305.1) Low back pain Hypertension Anxiety  Past Surgical History: Last updated: 2009-09-21 CABG x 5...2009 Shoulder surgery  Family History: Last updated: September 21, 2009  Father died at age 3 of congestive heart failure and  previous implantable defibrillator.  He had a myocardial infarction and  sudden death at a fairly young age.  Mother is 58, has a history of coronary  artery disease and previous angioplasty.  He is an only child.  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  He smokes between one half to one pack of cigarettes  per day, drinks two bottles of wine per week.  He has been married four  times  previously, the first for 11 years and has two children by that  marriage which are grown, the second time for 11 years and has two children  by that marriage.  Wife has custody in [place].  A third marriage was  4-1/2 years and most recent marriage was for the past eight weeks and he is  currently estranged from that person.  There is no history of illicit drug  use.  Risk Factors: Alcohol Use: 3 (02/11/2010) >5 drinks/d w/in last 3 months: no (02/11/2010)  Risk Factors: Smoking Status: current (02/11/2010)  Family History: Reviewed history from 09/21/09 and no changes required.  Father died at age 73 of congestive heart failure and  previous implantable defibrillator.  He had a myocardial infarction and  sudden death at a fairly  young age.  Mother is 75, has a history of coronary  artery disease and previous angioplasty.  He is an only child.  Social History: Reviewed history from 2009-09-21 and no changes required.  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  He smokes between one half to  one pack of cigarettes  per day, drinks two bottles of wine per week.  He has been married four  times  previously, the first for 11 years and has two children by that  marriage which are grown, the second time for 11 years and has two children  by that marriage.  Wife has custody in [place].  A third marriage was  4-1/2 years and most recent marriage was for the past eight weeks and he is  currently estranged from that person.  There is no history of illicit drug  use.  Review of Systems  The patient denies anorexia, fever, weight loss, weight gain, chest pain, syncope, dyspnea on exertion, peripheral edema, prolonged cough, headaches, hemoptysis, abdominal pain, hematuria, suspicious skin lesions, transient blindness, difficulty walking, depression, and enlarged lymph nodes.    Physical Exam  General:  Well-developed, well-nourished, in no acute distress; alert and oriented x 3.   Head:  normocephalic, atraumatic, no abnormalities observed, and no abnormalities palpated.   Mouth:  Oral mucosa and oropharynx without lesions or exudates.  Teeth in good repair. Neck:  supple, full ROM, no masses, no thyromegaly, no thyroid nodules or tenderness, no JVD, normal carotid upstroke, no carotid bruits, no cervical lymphadenopathy, and no neck tenderness.   Lungs:  normal respiratory effort, no intercostal retractions, no accessory muscle use, normal breath sounds, no dullness, no fremitus, no crackles, and no wheezes.   Heart:  normal rate, regular rhythm, no murmur, no gallop, no rub, and no JVD.   Abdomen:  soft, non-tender,  normal bowel sounds, no distention, no masses, no guarding, no rigidity, no rebound tenderness, no abdominal hernia, no inguinal hernia, no hepatomegaly, and no splenomegaly.   Msk:  No deformity or scoliosis noted of thoracic or lumbar spine.   Pulses:  R and L carotid,radial,femoral,dorsalis pedis and posterior tibial pulses are full and equal bilaterally Extremities:  No clubbing, cyanosis, edema, or deformity noted with normal full range of motion of all joints.   Neurologic:  No cranial nerve deficits noted. Station and gait are normal. Plantar reflexes are down-going bilaterally. DTRs are symmetrical throughout. Sensory, motor and coordinative functions appear intact. Skin:  turgor normal, color normal, no rashes, no suspicious lesions, no ecchymoses, no petechiae, no purpura, no ulcerations, and no edema.   Cervical Nodes:  no anterior cervical adenopathy and no posterior cervical adenopathy.   Axillary Nodes:  no R axillary adenopathy and no L axillary adenopathy.   Psych:  Cognition and judgment appear intact. Alert and cooperative with normal attention span and concentration. No apparent delusions, illusions, hallucinations   Impression & Recommendations:  Problem # 1:  HYPERTENSION (ICD-401.9) Assessment Improved  His updated medication list for this problem includes:    Atenolol 50 Mg Tabs (Atenolol) .Marland Kitchen... 1 tab two times a day    Azor 5-40 Mg Tabs (Amlodipine-olmesartan) ..... One by mouth once daily for high blood pressure  BP today: 128/84 Prior BP: 148/78 (01/15/2010)  Prior 10 Yr Risk Heart Disease: N/A (01/15/2010)  Labs Reviewed: K+: 3.3 (03/25/2007) Creat: : 0.9 (03/25/2007)   Chol: 112 (03/25/2007)   HDL: 30.3 (03/25/2007)   LDL: DEL (03/25/2007)   TG: 226 (03/25/2007)  Problem # 2:  TOBACCO USE (ICD-305.1) Assessment: Unchanged  Encouraged smoking cessation and discussed different methods for smoking cessation.   Complete Medication List: 1)  Atenolol 50 Mg  Tabs (Atenolol) .Marland Kitchen.. 1 tab two times a day 2)  Aspirin Ec 325 Mg Tbec (Aspirin) .... Take one tablet  by mouth daily 3)  Simvastatin 20 Mg Tabs (Simvastatin) .... 1/2 tab at bedtime 4)  Viagra 50 Mg Tabs (Sildenafil citrate) .... Take as directed as needed 5)  Klonopin 0.5 Mg Tabs (Clonazepam) .... One by mouth two times a day as needed for anxiety 6)  Buspirone Hcl 10 Mg Tabs (Buspirone hcl) .Marland Kitchen.. 1 tab once daily 7)  Niacin Cr 250 Mg Cr-caps (Niacin) .Marland Kitchen.. 1 cap once daily 8)  Cyclobenzaprine Hcl 10 Mg Tabs (Cyclobenzaprine hcl) .... One by mouth three times a day as needed for back spasm 9)  Azor 5-40 Mg Tabs (Amlodipine-olmesartan) .... One by mouth once daily for high blood pressure 10)  Xanax 1 Mg Tabs (Alprazolam) .... One by mouth once daily as needed for back spasm  Patient Instructions: 1)  Please schedule a follow-up appointment in 6 months. 2)  Tobacco is very bad for your health and your loved ones! You Should stop smoking!. 3)  Stop Smoking Tips: Choose a Quit date. Cut down before the Quit date. decide what you will do as a substitute when you feel the urge to smoke(gum,toothpick,exercise). 4)  It is important that you exercise regularly at least 20 minutes 5 times a week. If you develop chest pain, have severe difficulty breathing, or feel very tired , stop exercising immediately and seek medical attention. 5)  Check your Blood Pressure regularly. If it is above 140/90: you should make an appointment. Prescriptions: AZOR 5-40 MG TABS (AMLODIPINE-OLMESARTAN) One by mouth once daily for high blood pressure  #90 x 3   Entered and Authorized by:   Etta Grandchild MD   Signed by:   Etta Grandchild MD on 02/11/2010   Method used:   Print then Give to Patient   RxID:   [id number] AZOR 5-40 MG TABS (AMLODIPINE-OLMESARTAN) One by mouth once daily for high blood pressure  #56 x 0   Entered and Authorized by:   Etta Grandchild MD   Signed by:   Etta Grandchild MD on 02/11/2010    Method used:   Samples Given   RxID:   [id number]  [facility]  Orders Added: 1)  Est. Patient Level III [40102]

## 2010-02-28 NOTE — Progress Notes (Signed)
Summary: RX SENT TO [facility]   Phone Note Refill Request Message from:  Patient on February 20, 2010 1:56 PM  Refills Requested: Medication #1:  SIMVASTATIN 20 MG TABS 1/2 tab at bedtime [facility] [phone] would like the generic  Initial call taken by: Judie Grieve,  February 20, 2010 1:57 PM    Prescriptions: SIMVASTATIN 20 MG TABS (SIMVASTATIN) 1/2 tab at bedtime  #90 Tablet x 3   Entered by:   Danielle Rankin, CMA   Authorized by:   Gaylord Shih, MD, [facility]   Signed by:   Danielle Rankin, CMA on 02/20/2010   Method used:   Faxed to ...       [facility] Environmental education officer)       [address] [address]       [place], [place]  [address]       Ph: [phone]       Fax: [phone]   RxID:   [id number]

## 2010-02-28 NOTE — Progress Notes (Signed)
Summary: azor  Phone Note From Pharmacy   [facility]er: [facility] Summary of Call: Received [facility] from [facility] stating that Azor is not covered w/o a prior auth. Alturnatives are amlodipine-benazepril, amlodipine, losartan ,lisinopril, ramipril or catapril. Do you want to change or proceed with prior auth. Please advise Thanks.Marland KitchenMarland KitchenAlvy Beal Archie CMA  February 20, 2010 11:22 AM  Initial call taken by: Etta Grandchild MD,  February 20, 2010 11:29 AM  Follow-up for Phone Call        rx faxed.Alvy Beal Archie CMA  February 20, 2010 2:24 PM     New/Updated Medications: LOSARTAN POTASSIUM 100 MG TABS (LOSARTAN POTASSIUM) One by mouth once daily Prescriptions: LOSARTAN POTASSIUM 100 MG TABS (LOSARTAN POTASSIUM) One by mouth once daily  #90 x 3   Entered and Authorized by:   Etta Grandchild MD   Signed by:   Etta Grandchild MD on 02/20/2010   Method used:   Printed then faxed to ...       [facility])       [address] [address]       [place], [place]  [address]       Ph: [phone]       Fax: [phone]   RxID:   [id number]

## 2010-04-25 ENCOUNTER — Telehealth: Payer: Self-pay | Admitting: *Deleted

## 2010-04-25 MED ORDER — BUSPIRONE HCL 10 MG PO TABS: 10.0000 mg | ORAL_TABLET | Freq: Two times a day (BID) | ORAL | Status: DC

## 2010-04-25 NOTE — Telephone Encounter (Signed)
ok 

## 2010-04-25 NOTE — Telephone Encounter (Signed)
Patient requesting refill of buspirone 10 mg 1 bid. To [facility]. OK?

## 2010-05-28 NOTE — Assessment & Plan Note (Signed)
[facility]                            CARDIOLOGY OFFICE NOTE   [place], [place] R[place]                    MRN:          [id number]  DATE:01/05/2007                            DOB:          10/03/1950    [place] comes in today, discussed his catheterization findings.   Please note that he had a remote inferior wall infarct on his stress  Myoview, EF of 49%.  There was no substantial ischemia.   His anginal equivalent is dyspnea on exertion.  So this could be from  his lung disease from smoking.  We have had a very candid discussion  about all this today including risk factor modification at length.   His catheterization was performed by Dr. Tonny Bollman.  Since his  previous cath he had progressive disease in his right coronary artery  with about a 75% lesion.  This was a long, diffusely diseased blockage.  In addition he had nonobstructive disease in his circumflex.  His LAD  proximally had mild to moderate calcification and 30% - 40% stenosis.  He then had a 60% - 70% mid LAD at the arch of the second diagonal which  also had a 70% lesion.  This was a medium to large size vessel.  His EF  was 60%.   I have had a long, talk greater 20 minutes, with [place] today.  I  have reviewed his lipids which showed a low HDL and a LDL of 108 several  years ago.   PLAN:  1. Begin simvastatin 20 mg q.h.s.  He was made aware of the potential      interaction between simvastatin and Verapamil, but not at this      dose.  2. Check lipids and LFTs in 6 weeks.  3. Continue his other medications.  4. See [place] back in mid February per his request.  At that time will try      to make a decision on surgery versus continued medical therapy.  I      also talked to Dr. Excell Seltzer about the specifics of his targets for      bypass.     Thomas C. Daleen Squibb, MD, [facility]  Electronically Signed    TCW/MedQ  DD: 01/05/2007  DT: 01/05/2007  Job #: [id number]   cc:   Sharlot Gowda, M.D.

## 2010-05-28 NOTE — Discharge Summary (Signed)
NAMEBRADON, Larry Rangel             ACCOUNT NO.:  [id number]   MEDICAL RECORD NO.:  [id number]          PATIENT TYPE:  INP   LOCATION:  2016                         FACILITY:  [facility]   PHYSICIAN:  Salvatore Decent. Cornelius Moras, M.D. DATE OF BIRTH:  06-10-1950   DATE OF ADMISSION:  01/26/2007  DATE OF DISCHARGE:  01/28/2007                               DISCHARGE SUMMARY   FINAL DIAGNOSIS:  Severe three-vessel coronary artery disease.   IN-HOSPITAL DIAGNOSIS:  1. Acute blood loss anemia.  2. Volume overload.   SECONDARY DIAGNOSES:  1. Hypertension.  2. Hyperlipidemia.  3. Tobacco abuse.  4. Right shoulder surgery.  5. Tonsillectomy.   IN-HOSPITAL OPERATIONS AND PROCEDURES:  Coronary artery bypass grafting  times five using a left internal mammary artery to distal left anterior  descending coronary artery, free right internal mammary artery to distal  right coronary artery, saphenous vein graft to second diagonal branch,  saphenous vein graft to first circumflex marginal branch with sequential  saphenous vein graft to left posterior lateral branch.  Endoscopic  saphenous vein harvest from right thigh was done.   HISTORY OF PRESENT ILLNESS:  The patient is a 60 year old male with  known history of coronary artery disease, hypertension, hyperlipidemia,  tobacco abuse.  The patient underwent cardiac catheterization in May of  2008 for symptoms of chest pain and was found to have three-vessel  coronary artery disease with normal left ventricular function.  He was  treated medically at that time.  The patient now presents with  progressive symptoms of exertional shortness of breath, fatigue and  intermittent episodes of atypical chest pain.  Repeat cardiac  catheterization demonstrates progression of three-vessel coronary artery  disease with preserved left ventricular function.  Following  catheterization, Dr. Cornelius Moras was consulted.  Dr. Cornelius Moras saw and evaluated the  patient.  He discussed with the  patient undergoing coronary artery  bypass grafting.  He discussed risks and benefits with the patient.  The  patient voiced understanding agreed to proceed.  Surgery was scheduled  for January 26, 2007.  For details of the patient's past medical history  and physical exam please see dictated H&P.   HOSPITAL COURSE:  The patient was taken to the operating room January 26, 2007 where he underwent coronary artery bypass grafting x3 using a  left internal mammary artery to left anterior descending coronary  artery, free right internal mammary artery to distal right coronary  artery, saphenous vein graft to second diagonal branch, saphenous vein  graft to first circumflex marginal branch with sequential saphenous vein  graft to the left posterior lateral branch.  Endoscopic saphenous vein  harvest from right thigh was done.  The patient tolerated this procedure  and was transferred to the intensive care unit in stable condition.  Postoperatively, the patient was noted to be hemodynamically stable.  He  was able to be extubated the evening of surgery.  Post extubation, the  patient was noted to be alert and oriented x4.  Prior to the patient  being extubated, the patient underwent a portable chest x-ray, noted to  have right upper lobe atelectasis  with copious airway secretions.  The  patient received nebulizer breathing treatments prior to extubation.  Follow-up x-ray postop day #1 was improved and stable.  The patient had  minimal drainage from chest tubes and chest tubes were discontinued  in  a normal fashion.  The patient was placed on nasal cannula post  extubation.  He was able to be weaned off oxygen sating greater than 90%  prior to discharge home.  Postoperatively, the patient was noted to be  in normal sinus rhythm.  He was able to be weaned from all drips.  Heart  rate and blood pressure were stable.  Swan-Ganz catheter discontinued in  a normal fashion.  He was restarted on  low-dose beta blocker.  He  remained in normal sinus rhythm during his postoperative course.  The  patient did have slight volume overload postoperatively.  He was started  on diuretics daily.  Daily weights were obtained and the patient's  volume overload was improving.  He is still currently 6 kg above his  preoperative weight.  The patient did have slight acute blood loss  anemia postoperatively.  He did not require any transfusion.  Hemoglobin/hematocrit was monitored and remained stable.  Prior to  discharge home, H&H was 9.3 and 26.4.  The patient did have slight  hypokalemia postoperatively and this was replaced.  The patient had  improved prior to discharge.  Postoperatively, the patient was  ambulating well with cardiac rehab.  He was tolerating diet well.  No  nausea, vomiting noted.  Labs postop day #2 showed a white count of  11.3, hemoglobin 9.3, hematocrit 26.4 platelet count 112,000.  Sodium  was 136, potassium 3.2, chloride of 102, bicarb of 29, BUN 14,  creatinine 0.93, glucose of 119.  The patient is tentatively ready for  discharge home in the next 24-48 hours pending he remains stable.   FOLLOW-UP APPOINTMENTS:  Follow-up appointment was arranged with Dr.  Cornelius Moras for February 15, 2007 a 2:30 p.m..  The patient will need to obtain  PMI chest x-ray 30 minutes prior to this appointment.  The patient will  need to follow up with Dr. Donnie Aho in 2 weeks.  He will need to contact  Dr. York Spaniel office to make these arrangements.   Incisional care:  The patient was told to shower washing his incisions  using soap and water.  He is to contact the office if he develops any  drainage or opening from any of his incision sites.   Diet:  Low-fat, low-salt.   DISCHARGE MEDICATIONS:  1. Aspirin 325 mg daily.  2. Atenolol 50 mg twice daily.  3. Zocor 20 mg at night.  4. Lasix 40 mg daily times 5 days.  5. Potassium chloride 20 mEq daily times 5 days.  6. Clonazepam 0.5 mg t.i.d.  p.r.n..  7. Oxycodone 5 mg 1-2 tablets q. 4-6 hours p.r.n..  8. Verapamil SR 240 mg daily.      Theda Belfast, PA      Salvatore Decent. Cornelius Moras, M.D.  Electronically Signed    KMD/MEDQ  D:  01/28/2007  T:  01/28/2007  Job:  [id number]

## 2010-05-28 NOTE — H&P (Signed)
HISTORY AND PHYSICAL EXAMINATION   January 19, 2007   Re:  [place], Larry Rangel       DOB:  May 18, 1950   PRESENTING CHIEF COMPLAINT:  Exertional shortness of breath, chest  discomfort, fatigue.   HISTORY OF PRESENT ILLNESS:  Patient is a 60 year old gentleman from  [place] with a known history of coronary artery disease,  hypertension, hyperlipidemia, and tobacco abuse.  The patient's cardiac  disease dates back several years and in May, 2008, he was hospitalized  with symptoms of chest pain and underwent a cardiac catheterization by  Dr. Viann Fish.  He was found to have three vessel coronary artery  disease with normal left ventricular function.  He was treated  medically.  More recently, he has been followed by Dr. Juanito Doom.  He has  complained of progressive fatigue as well as some exertional shortness  of breath and intermittent episodes of atypical chest discomfort.  The  patient otherwise remained fairly active physically, although he has  slowed down over the last few months.  He underwent a stress Cardiolite  exam that was abnormal, prompting a follow-up cardiac catheterization.  This was performed on December 24, 2006 by Dr. Tonny Bollman.  The  patient was found to have progression of three vessel coronary artery  disease with preserved left ventricular function.  The patient has now  been referred for a possible elective surgical revascularization.   REVIEW OF SYSTEMS:  GENERAL:  The patient reports stable appetite.  He  has not been gaining or losing weight recently.  He is 6 feet 1 inches  tall and weighs approximately 205 pounds.  The patient does complain of  progressive fatigue.  CARDIAC:  Notable for intermittent, vague episodes of chest tightness  that seems to come and go sporadically and are not necessarily  associated with physical activity or stress.  These symptoms are  relatively mild and not terribly limiting, but they do seem to  be  exacerbated by stress and increased activity on some occasions.  The  patient does complain of mild exertional shortness of breath and  progressive fatigue.  He has limited his physical activity a little bit  because of this.  He denies any prolonged episodes of severe substernal  chest discomfort or resting shortness of breath.  He denies any PND,  orthopnea, or lower extremity edema.  He has not had any palpitations or  syncope.  RESPIRATORY:  Negative.  The patient denies a productive cough,  hemoptysis, wheezing.  GASTROINTESTINAL:  Negative.  The patient has no difficulty swallowing.  He reports normal bowel function.  He denies hematochezia, hematemesis,  melena.  GENITOURINARY:  Negative.  The patient denies urinary difficulties.  PERIPHERAL VASCULAR:  Negative.  The patient reports no symptoms  suggestive of claudication.  NEUROLOGIC:  Negative.  The patient denies transient monocular blindness  or transient numbness or weakness involving either upper and lower  extremity.  MUSCULOSKELETAL:  Negative.  The patient denies any limiting arthritis  or arthralgias.  PSYCHIATRIC:  Negative.  HEENT:  Negative.  HEMATOLOGIC:  Negative.   PAST MEDICAL HISTORY:  1. Coronary artery disease.  2. Hypertension.  3. Hyperlipidemia.  4. Tobacco abuse.   PAST SURGICAL HISTORY:  1. Right shoulder surgery.  2. Tonsillectomy.   FAMILY HISTORY:  Notable in that the patient's father had coronary  artery disease.   SOCIAL HISTORY:  The patient is married and lives with his wife in  [place].  He is a Warden/ranger.  He remains quite active physically.  He has a longstanding history of tobacco abuse, although he has cut back  to smoking approximately one-half pack of cigarettes per day.  He denies  any significant alcohol consumption.   CURRENT MEDICATIONS:  1. Verapamil SR 240 mg daily.  2. Atenolol 12.5 mg daily.  3. Aspirin 81 mg daily.  4. Simvastatin 20 mg daily.   DRUG  ALLERGIES:  None known.   PHYSICAL EXAMINATION:  Patient is a well-appearing male who appears  somewhat younger than stated age in no acute distress.  Blood pressure  is 156/88.  Pulse 57.  Oxygen saturation 94% on room air.  HEENT exam is  unrevealing.  Neck is supple.  There is no cervical or supraclavicular  lymphadenopathy.  There is no jugular venous distention.  Auscultation  of the chest demonstrates clear breath sounds which are symmetrical  bilaterally.  No wheezes or rhonchi are demonstrated.  Cardiovascular:  Regular rate and rhythm.  No murmurs, rubs or gallops are noted.  Abdomen is soft and nontender.  There are no palpable masses.  Bowel  sounds are present.  The extremities are warm and well perfused.  There  is no lower extremity edema.  Distal pulses are palpable in both lower  legs at the ankle.  There is no sign of venous insufficiency.  The skin  is clean, dry, and healthy-appearing throughout.  Rectal and GU exams  are both deferred.  Neurologic examination is grossly nonfocal and  symmetric throughout.   DIAGNOSTIC TESTING:  Cardiac catheterization performed by Dr. Tonny Bollman is reviewed.  This demonstrates three vessel coronary artery  disease with normal left ventricular function.  Specifically, there is  70% stenosis of the left anterior descending coronary artery beginning  and involving take-off of a large second diagonal branch.  There is 70%  proximal stenosis of a small first diagonal branch and 70-80% Larry  segment proximal stenosis of a large second diagonal branch.  There is  50% proximal stenosis of a large first circumflex marginal branch.  There is 70% stenosis of the distal left circumflex coronary artery  before a posterolateral branch.  There is 70-80% proximal stenosis of  the right coronary artery with 50% stenosis of the mid right coronary  artery.  There is normal left ventricular function with no significant  wall motion  abnormalities.   IMPRESSION:  Severe three vessel coronary artery disease with normal  left ventricular function:  Patient has somewhat vague and atypical  symptoms, but his exertional shortness of breath, fatigue, and  intermittent chest pain could all or partially be related to symptoms of  angina pectoris.  He clearly has significant three vessel coronary  artery disease, and as such, I suspect that he would benefit from  surgical revascularization in terms of decreased risk of future cardiac  event and improved Larry-term survival.  Hopefully, he will also enjoy  some symptomatic improvement.  He needs to quit smoking.   PLAN:  I have discussed matters at length with patient here in the  office today.  Alternative treatment strategies have been discussed,  including continued medical therapy versus percutaneous coronary  intervention versus surgical revascularization.  He understands and  accepts all potential associated risks of surgery, including but not  limited to risk of death, stroke, myocardial infarction, congestive  heart failure, respiratory failure, pneumonia, bleeding requiring blood  transfusion, arrhythmia, infection, and recurrent coronary artery  disease.  All of his questions have been addressed.  We tentatively plan  to proceed with surgery on Tuesday, January 13th.   [place], M.D.  Electronically Signed   CHO/MEDQ  D:  01/19/2007  T:  01/19/2007  Job:  [id number]   cc:   Jesse Sans. Daleen Squibb, MD, [facility]  Sharlot Gowda, M.D.  Veverly Fells. Excell Seltzer, MD

## 2010-05-28 NOTE — Assessment & Plan Note (Signed)
[facility]                            CARDIOLOGY OFFICE NOTE   Rangel, Larry                    MRN:          [id number]  DATE:12/21/2006                            DOB:          19-Sep-1950    CHIEF COMPLAINT:  I am exhausted, give out at the end of the day and  short of breath.   HISTORY OF PRESENT ILLNESS:  Mr. Larry Rangel is a 60 year old  married white male who has known coronary disease.  He was cathed in May  of 2005 by Dr. Viann Fish after presenting with unstable angina.  He  had a 60% LAD, mild irregularity of the circumflex, 40% right coronary  stenosis.  Normal left ventricular function.  His troponins peaked at  0.08.   He has multiple cardiac risk factors including a family history,  hypertension, hyperlipidemia not on treatment and cigarette use.  He is  also 60 years of age, male and under a lot of stress.   I saw him in the office on December 08, 2006.  We performed a stress  Myoview, showed an ejection fraction of 49% with a decrease in wall  motion of the inferior wall.  There is a question of this being a scar,  but no definite ischemia.   PAST MEDICAL HISTORY:  He is currently on:  1. Atenolol.  2. Chlorthalidone 12.5/25 daily.  3. Klonopin 0.5 mg a day.  4. Aspirin 81 mg a day.  5. Verapamil 240 mg a day.   HE HAS NO KNOWN DRUG ALLERGIES.   He smokes about a 1/4 of a pack of cigarettes a day.  He has been  smoking for 30 years.  He has one bottle of wine per month and he drinks  8 cups of coffee a day.   He enjoys multiple activities including walking, carpentry, boating,  swimming and scuba.   PAST MEDICAL HISTORY:  He had shoulder surgery in 2005.   FAMILY HISTORY:  Is positive for heart disease in his father.   SOCIAL HISTORY:  Married and has 4 children.  He admits to having some  marital stress at present.  He is a Clinical research associate, speaker and a Proofreader.   REVIEW OF SYSTEMS:  Other than the HPI is  only positive for arthritis.   EXAMINATION:  His blood pressure is 146/74, pulse 51 in sinus brady, he  is 6 foot 1-1/2, weighs 221.  HEENT:  Normocephalic/atraumatic, PERRLA, extraocular movements intact,  sclerae are clear, facial symmetry is normal, dentition is satisfactory.  NECK:  Supple, carotid upstrokes were equal bilaterally without bruits,  no JVD, thyroid is not enlarged, trachea is midline.  LUNGS:  Clear.  HEART:  Reveals a regular rate and rhythm without gallop, PMI is  nondisplaced.  ABDOMINAL:  Soft, good bowel sounds, no midline bruit, no hepatomegaly.  EXTREMITIES:  No cyanosis, clubbing or edema.  Pulses are intact.  NEURO:  Intact.  SKIN:  Unremarkable except for a few tattoos.   ASSESSMENT:  1. Exertional dyspnea, exhaustion, known coronary disease and a      positive Myoview suggesting a  new inferior wall infarct.  He      probably has had progression of his coronary disease.  2. Multiple cardiac risk factors as outlined above.   PLAN:  Outpatient catheterization in the [facility] cath lab.  Indications and  risks, potential benefits were discussed with the patient.  He agrees to  proceed.     Thomas C. Daleen Squibb, MD, [facility]  Electronically Signed    TCW/MedQ  DD: 12/21/2006  DT: 12/21/2006  Job #: [id number]   cc:   Sharlot Gowda, M.D.

## 2010-05-28 NOTE — Assessment & Plan Note (Signed)
[facility]                            CARDIOLOGY OFFICE NOTE   SHAHIL, SPEEGLE                    MRN:          [id number]  DATE:03/26/2007                            DOB:          01/24/1950    Mr. Arocho returns today for further management of his cardiovascular  disease, mixed hyperlipidemia, tobacco use.   He is living down in [facility] [place] [facility] near [place].  His stress  levels are at an all-time low.  He is about finished with a divorce.   He has cut his cigarette substantially but is still having difficulties.  He says that now tax has increased one dollar a pack on cigarettes he  will probably quit.   We checked blood work on simvastatin 20 mg q.h.s.  His total cholesterol  has dropped to 112.  His triglycerides have gone from 303 to 226.  HDL  has actually increased from 26 to 30.3.  His VLDL was 45.  His LDL  cholesterol is 56.8.  Total cholesterol to HDL ratio is 3.7.  His LFTs  are normal.  Remarkably, his blood sugar was slightly increased at 122.  His potassium was 3.3.  He is no longer on a diuretic.   His medicines are:  1. Aspirin 325 a day.  2. Atenolol 50 mg p.o. b.i.d.  3. Zocor 20 mg q.h.s.  4. Verapamil 360 mg a day.  5. Selenium.  6. He takes Klonopin twice a day and would like me to fill that since      he could not get a hold of Dr. Sharlot Gowda today.  I have agreed      for 1 month but no refills.   His blood pressure today was elevated at 167/83.  He usually runs around  140/75.  HEENT:  Unchanged.  Skin color is improved.  Carotid upstrokes were  equal bilaterally without bruits.  There is no JVD.  Thyroid is not  enlarged.  Trachea is midline.  LUNGS:  Clearer.  HEART:  Reveals normal S1, S2, nondisplaced PMI.  His sternotomy site is  healed nicely.  ABDOMINAL EXAM:  Soft, good bowel sounds.  EXTREMITIES:  Reveal no edema.  Pulses are intact.  NEUROLOGIC EXAM:  Intact.   ASSESSMENT AND PLAN:   Mr. Laymon seems to be more motivated than I have  seen.  We talked about increasing potassium by potassium-rich foods.  We  have also talked about the importance of decreasing tobacco use at least  moderately.  He will continue with the simvastatin which has done a nice  job with his LDL.  He also looks like he needs to watch carbohydrates to  some degree with his slightly elevated blood sugar, not to mention his  hypertriglyceridemia.   After 3 months, he can increase his physical activity.  I will plan on  seeing him back again at that time, which will be sort of the end of  April.     Thomas C. Daleen Squibb, MD, [facility]  Electronically Signed    TCW/MedQ  DD: 03/26/2007  DT: 03/28/2007  Job #: [id number]  cc:   Sharlot Gowda, M.D.

## 2010-05-28 NOTE — Assessment & Plan Note (Signed)
[facility]                            CARDIOLOGY OFFICE NOTE   Larry Rangel, Larry Rangel                    MRN:          [id number]  DATE:12/08/2006                            DOB:          30-Mar-1950    CHIEF COMPLAINT:  I'm just stressed out, exhausted, and just give out  before the end of the day.   HISTORY:  Mr. Larry Rangel is a  60 year old married white male with  known coronary disease.  He was admitted on __________  with chest  discomfort consistent with unstable angina.  He had a cardiac  catheterization by Dr. Lacretia Nicks. Viann Fish.  It showed a 60% LAD stenosis,  mild irregularity of the circumflex, and a 40% right coronary stenosis,  normal left ventricular function.  His troponins were peaked at 0.08.   He has multiple risk factors including family history of hypertension;  hyperlipidemia, not on treatment; and cigarette use.  He also is now 60  years of age, male, and is under a lot of stress.   He is having some dyspnea on exertion, but really it is just exhaustion  with work.  He is having no angina per se.   PAST MEDICAL HISTORY:  He is currently on:  1. Atenolol/chlorthalidone 12.5/25 daily.  2. Klonopin 0.5 mg.  3. Aspirin 81 mg a day.  4. Verapamil 240 mg a day.   ALLERGIES:  He has no known drug allergies.   He is smoking about one fourth pack of cigarettes a day.  He has been  smoking for 30 years.  He has one bottle of my wine per month.  He  drinks 8 cups of coffee a day.   He enjoys multiple activities; including walking, carpentry, boating,  swimming, scuba.   PAST MEDICAL HISTORY SURGERY:  He had shoulder surgery in 2005.   FAMILY HISTORY:  Positive for heart disease in his father.   SOCIAL HISTORY:  He is married and has four children.  He is having some  marital difficulties.  He is a Clinical research associate, speaker, and a Proofreader.   REVIEW OF SYSTEMS:  Other than the HPI is positive for some arthritis.   PHYSICAL  EXAMINATION:  Blood pressure is 145/89.  His pulse is 51 in  sinus bradycardia.  EKG shows sinus bradycardia with some LVH with  strain and poor R-wave progression across the anterior precordium.  He  has a fair amount of artifact.  His weight is 221.  He is 6 feet 1-1/2  inches.  HEENT:  Normocephalic, atraumatic.  PERLA.  Extraocular movements  intact.  Sclerae are clear.  Facial symmetry is normal.  Carotid upstrokes are equal bilaterally without bruits.  No JVD.  Thyroid is that enlarged.  Trachea is midline.  LUNGS:  Clear.  HEART:  Reveals a regular rate and rhythm without gallop.  PMI is  nondisplaced.  ABDOMINAL EXAM:  Soft good bowel sounds.  No midline bruit.  No  hepatomegaly.  EXTREMITIES:  No cyanosis, clubbing, nor edema.  Pulses are intact.  NEUROLOGIC EXAMINATION:  Normal.  SKIN:  Unremarkable except for a  few tattoos.   ASSESSMENT:  1. Exertional dyspnea, fatigue, exhaustion with known coronary      disease.  Rule out ischemic equivalent.  2. Multiple cardiac risk factors as outlined above.   PLAN:  Exercise rest stress Myoview off of his atenolol.  We will  arrange this tomorrow, per his schedule.  If he has a significant degree  of ischemia, he will need a cardiac catheterization.     Thomas C. Daleen Squibb, MD, [facility]  Electronically Signed    TCW/MedQ  DD: 12/08/2006  DT: 12/08/2006  Job #: [id number]   cc:   Sharlot Gowda, M.D.

## 2010-05-28 NOTE — Cardiovascular Report (Signed)
Larry Rangel, Larry Rangel             ACCOUNT NO.:  [id number]   MEDICAL RECORD NO.:  [id number]          PATIENT TYPE:  OIB   LOCATION:  NA                           FACILITY:  [facility]   PHYSICIAN:  Veverly Fells. Excell Seltzer, MD  DATE OF BIRTH:  1950-07-06   DATE OF PROCEDURE:  12/24/2006  DATE OF DISCHARGE:                            CARDIAC CATHETERIZATION   PROCEDURE:  Left heart catheterization, selective coronary angiography,  left ventricular angiography.   INDICATION:  Larry Rangel is a 60 year old gentleman with known CAD.  He  had  undergone a catheterization back in 2005 by Larry Rangel.  This demonstrated moderate LAD stenosis and mild left circumflex and  right coronary artery stenosis.  He continues to complain of exertional  dyspnea and fatigue.  He underwent a Myoview study that was suggestive  of inferior scar.  He was referred for catheterization.   Risks and indications of the procedure were reviewed with the patient  and informed consent was obtained.  The right groin was prepped, draped,  anesthetized with 1% lidocaine.  A 4-French sheath was placed in the  right femoral artery using modified Seldinger technique.  Standard 4-  French catheters were used for coronary angiography as well as left  ventricular angiography.  A pullback across the aortic valve was done.  All catheter exchanges were performed over a guidewire.  The patient  tolerated the procedure well and had no immediate complications.   FINDINGS:  Aortic pressure is 136/62 with mean of 93, left ventricular  pressure was 135/21.   CORONARY ANGIOGRAPHY:  The left mainstem has diffuse luminal  irregularity but no significant stenosis.  It is a large left main that  bifurcates into the LAD and left circumflex.   The LAD is a large vessel that courses down and wraps around the LV  apex.  There is mild-to-moderate calcification throughout the proximal  and mid portions of the LAD.  The proximal LAD has a 30%  to 40%  stenosis.  There is diffuse luminal irregularity that leads into a 60%  to 70% stenosis in the mid LAD at the origin of the second diagonal  branch.  The origin of the diagonal branch has another 70% stenosis and  this is a medium- to large-sized diagonal.  The remaining portions of  the mid and distal LAD have diffuse luminal irregularities but no  significant focal stenoses.   The left circumflex is a large-caliber vessel.  It courses down and  supplies a large first OM branch.  The proximal circumflex is  angiographically normal.  The OM branch has mild calcification and has a  30% to 40% lesion in its proximal aspect.  The mid circumflex beyond the  OM branch has a focal 50% stenosis and this courses down and supplies a  large left posterolateral branch that also has a focal 50% stenosis.   The right coronary artery is diffusely diseased.  The proximal aspect of  the vessel has a long 75% stenosis and then a subsequent 60% to 70% more  focal stenosis in the mid portion and further down at the  junction of  the mid and distal right coronary artery there is a 40% stenosis.  The  vessel supplies a posterolateral branch and a small PDA and appears to  be codominant with the left circumflex.  There are no other significant  stenoses.   Left ventricular function is normal with LVEF of 60%.   ASSESSMENT:  1. Moderate left anterior descending/diagonal stenosis.  2. Moderately severe right coronary artery stenosis.  3. Mild left circumflex stenosis.  4. Normal left ventricular function.   PLAN:  Larry Rangel does not have critical CAD; however, he does have  diffuse moderate disease.  I think his most significant stenosis is in  his proximal right coronary artery.  The right coronary artery is a  diffusely diseased vessel and if treated percutaneously would require a  minimum of 2 stents.  The LAD diagonal bifurcation also has moderate  disease.  I have compared his films to his  previous cath films from 2005  and the LAD diagonal bifurcation has progressed minimally.  The diagonal  ostium is slightly worse than it was previously.  The LAD lesion is  essentially unchanged.  The disease in the right coronary artery has  progressed.  It previously appeared  nonobstructive and now is in the  range of 70% to 80%.  In that setting, Larry Rangel has had a nonischemic  Myoview study.  He does not have classic angina.  He is also not willing  to take Plavix because he is very active and commonly sustains abrasions  and cuts because of his hard physical work.  He has been on the medicine  in the past and has had a good deal of bleeding.  I reviewed the  situation with Larry Rangel and we will initially recommend medical therapy.  If he does not respond well, then Larry Rangel will decide on his best  revascularization option.      Veverly Fells. Excell Seltzer, MD  Electronically Signed     MDC/MEDQ  D:  12/24/2006  T:  12/24/2006  Job:  [id number]   cc:   Larry C. Wall, MD, [facility]

## 2010-05-28 NOTE — Op Note (Signed)
NAMEALOIS, COLGAN             ACCOUNT NO.:  [id number]   MEDICAL RECORD NO.:  [id number]          PATIENT TYPE:  INP   LOCATION:  2304                         FACILITY:  [facility]   PHYSICIAN:  Salvatore Decent. Cornelius Moras, M.D. DATE OF BIRTH:  June 14, 1950   DATE OF PROCEDURE:  01/26/2007  DATE OF DISCHARGE:                               OPERATIVE REPORT   PREOPERATIVE DIAGNOSIS:  Severe three vessel coronary artery disease.   POSTOPERATIVE DIAGNOSIS:  Severe three vessel coronary artery disease.   PROCEDURE:  Median sternotomy for coronary artery bypass grafting x5  (left internal mammary artery to distal left anterior descending  coronary artery, free right internal mammary artery to distal right  coronary artery, saphenous vein graft to second diagonal branch,  saphenous vein graft to first circumflex marginal branch with sequential  saphenous vein graft to left posterolateral branch, endoscopic saphenous  vein harvest from right thigh).   SURGEON:  Salvatore Decent. Cornelius Moras, M.D.   FIRST ASSISTANT:  Salvatore Decent. Dorris Fetch, M.D.   SECOND ASSISTANT:  Theda Belfast, P.A.-C.   ANESTHESIA:  General.   BRIEF CLINICAL NOTE:  The patient is a 60 year old male with known  history of coronary artery disease, hypertension, hyperlipidemia, and  tobacco use.  The patient underwent cardiac catheterization in May 2008  for symptoms of chest pain and was found to have three vessel coronary  artery disease with normal left ventricular function.  He was treated  medically.  He now presents with progressive symptoms of exertional  shortness of breath, fatigue and intermittent episodes of atypical chest  pain.  Repeat cardiac catheterization demonstrates progression of three  vessel coronary artery disease but preserved left ventricular function.  A full consultation has been dictated previously.  The patient has been  counseled at length regarding the indications, risks, and potential  benefits of surgery.   Alternative treatment strategies have been  discussed.  He understands and accepts all associated risks of surgery  and desires to proceed as described.   OPERATIVE FINDINGS:  1. Normal left ventricular function.  2. Mild left ventricular hypertrophy.  3. Good quality internal mammary artery conduits for grafting,      although the right internal mammary artery had to be utilized as a      free graft due to inadequate length.  4. Good quality saphenous vein conduit.  5. Good quality target vessels for grafting.   OPERATIVE NOTE IN DETAIL:  The patient was brought to the operating room  on the above mentioned date and central monitoring was established by  the anesthesia service under the care and direction of Dr. Diamantina Monks.  Specifically, a Swan-Ganz catheter is placed through the right internal  jugular approach.  A radial arterial line is placed.  Intravenous  antibiotics were administered.  Following induction with general  endotracheal anesthesia, a Foley catheter was placed.  The patient's  chest, abdomen, both groins, and both lower extremities were prepared  and draped in a sterile manner.  Baseline transesophageal echocardiogram  was performed by Dr. Katrinka Blazing.  This demonstrates normal left ventricular  function.  There is mild left  ventricular hypertrophy.  No other  significant abnormalities are noted.   A median sternotomy incision was performed and the left internal mammary  artery is dissected from the chest wall and prepared for bypass  grafting.  Following this, the right internal mammary artery is also  dissected from the chest wall and prepared for bypass grafting.  Both  vessels were good quality conduits for grafting.  Simultaneously,  saphenous vein was obtained from the patient's right thigh and the upper  portion of the right lower leg using endoscopic vein harvest technique.  Saphenous vein is a good quality conduit.  After the saphenous vein has  been removed,  the small incisions in the right lower extremity are  closed in multiple layers with running absorbable suture.  The patient  is heparinized systemically and both internal mammary arteries are  transected distally.  They are noted to have excellent flow.   The pericardium was opened.  The ascending aorta was cannulated for  cardiopulmonary bypass.  The aorta is normal in appearance.  The venous  cannula is placed through the tip of the right atrial appendage.  Adequate heparinization is verified.  Cardiopulmonary bypass is begun  and the surface of the heart was inspected.  Distal target vessels were  selected for coronary bypass grafting.  The right internal mammary  artery was not long enough to reach any of the target vessels as an in  situ graft.  Therefore, the right internal mammary artery is transected  just after its takeoff from the subclavian artery and the proximal stump  was oversewn with suture ligatures.  A temperature probe is placed in  the left ventricular septum and a cardioplegic catheter is placed in the  ascending aorta.  The patient is allowed to cool passively to 32 degrees  systemic temperature.  The aortic crossclamp was applied and cold blood  cardioplegia is administered in an antegrade fashion through the aortic  root.  Iced saline slush was applied for topical hypothermia.  The  initial cardioplegic arrest and myocardial cooling are felt to be  excellent.  Repeat doses of cardioplegia are administered intermittently  throughout the crossclamp portion of the operation through the aortic  root and down the subsequently placed vein grafts to maintain left  ventricular septal temperature below 15 degrees centigrade.   The following distal coronary anastomoses were performed:  1. The first circumflex marginal branch is grafted with a saphenous      vein graft in a side-to-side fashion.  This vessel measures 2 mm in      diameter and is intramyocardial.  It is a  good quality target      vessel for grafting.  2. The posterolateral branch off the distal left circumflex coronary      artery is grafted using a sequential saphenous vein graft off of      the vein placed to the circumflex marginal branch.  This vessel      measures 1.5 mm in diameter and is a good quality target vessel for      grafting.  3. The second diagonal branch off the left anterior descending      coronary artery is grafted with a saphenous vein graft in an end-to-      side fashion.  This vessel measures 1.5 mm in diameter and is a      fair to good quality target vessel for grafting.  4. The distal right coronary artery is grafted with a right internal  mammary artery as a free graft in an end-to-side fashion.  This      vessel measures 2 mm in diameter and is a good quality target      vessel for grafting.  5. The distal left anterior descending coronary artery is grafted with      a left internal mammary artery in an end-to-side fashion.  At the      site of distal grafting, there is some plaque which is subsequently      opened up due to posterior plaque so that a long arteriotomy is      created to cover both sides of the plaque.  A 1.5 mm probe will      pass in both directions and the vessel is, otherwise, a fair to      good quality target vessel for grafting.   Both proximal saphenous vein anastomoses were performed directly to the  ascending aorta prior to removal of the aortic crossclamp.  An  additional short segment of saphenous vein is sewn to the ascending  aorta to serve as a proximal stump for the free right internal mammary  artery graft.  The left ventricular septal temperature rises rapidly  with reperfusion of the left internal mammary artery.  The aortic  crossclamp was removed after a total crossclamp time of 89 minutes.  The  proximal end of the right internal mammary artery graft is then sewn to  a short stump of saphenous vein to connect it to  the ascending aorta due  to the relatively small caliber of the proximal end of the right  internal mammary artery.  Epicardial pacing wires are affixed to the  right ventricular free wall and to the right atrial appendage.  All  proximal and distal coronary anastomoses were inspected for hemostasis  and appropriate graft orientation.  The patient is rewarmed to 37  degrees centigrade temperature.  The patient is weaned from  cardiopulmonary bypass without difficulty.  The patient's rhythm at  separation from bypass is normal sinus rhythm.  No inotropic support is  required.  Total cardiopulmonary bypass time for the operation was 121  minutes.   The venous and arterial cannulae are removed uneventfully.  Protamine is  administered to reverse the anticoagulation.  The mediastinum and both  left and right pleural spaces are irrigated with saline solution  containing vancomycin.  The mediastinum and both pleural spaces were  drained with four chest tubes exited through separate stab incisions  inferiorly.  The pericardium and soft tissues anterior to the aorta are  reapproximated loosely.  The sternum was closed with double strength  sternal wire.  The On-Q continuous pain management system is utilized to  facilitate postoperative pain control.  Two 10 inch catheters supplied  with the On-Q kit are tunneled into the subcutaneous tissues and  positioned just lateral to the lateral border of the sternum on either  side.  Each catheter is flushed with 5 mL of 0.5% bupivacaine solution  and ultimately connected to a continuous infusion pump.  The soft  tissues anterior to the sternum are closed in multiple layers and the  skin was closed with a running subcuticular skin closure.   The patient tolerated the procedure well and was transported to the  surgical intensive care unit in stable condition.  There are no  intraoperative complications.  All sponge, instrument and needle counts  are  verified correct at completion of the operation.  No blood products  were  administered.      Salvatore Decent. Cornelius Moras, M.D.  Electronically Signed     CHO/MEDQ  D:  01/26/2007  T:  01/26/2007  Job:  [id number]   cc:   Thomas C. Daleen Squibb, MD, [facility]  Sharlot Gowda, M.D.  Veverly Fells. Excell Seltzer, MD

## 2010-05-28 NOTE — Assessment & Plan Note (Signed)
[facility]                            CARDIOLOGY OFFICE NOTE   MARLAN, STEWARD                    MRN:          [id number]  DATE:05/10/2007                            DOB:          [birth year]    Mr. Vanaken returns today for follow-up.  He is now about 3 months out  from his bypass surgery.   He is feeling remarkably well and actually sawed down a bunch of trees  this past week.  He is having a little bit of chest incisional numbness  but no soreness or no instability.   CURRENT MEDICATIONS:  1. Aspirin 325 a day.  2. Atenolol 50 mg p.o. b.i.d.  3. Zocor 20 mg a day.  4. Verapamil 360 mg a day.   His blood work showed a potassium of 3.3 and we have increased potassium  rich foods.  His total cholesterol was down to 112, triglycerides were  down to 226, then 303, HDL was up from 26 to 30.3.  His LDL was 56.8.   PHYSICAL EXAMINATION:  Vital signs:  His blood pressure is 161/88.  He  usually runs around 140 and he says if he runs lower that, he gets  lightheaded and dizzy.  He says he has always been this way.  Pulse 60  and regular.  HEENT:  Unchanged.  NECK:  Carotids are full without bruits.  LUNGS:  Clear.  HEART:  Reveals a regular rate and rhythm.  No gallop or rub.  Sternotomy site is stable.  EXTREMITIES:  No edema.  Pulses are intact.   Mr. Ambrocio is doing well.  I have made no changes in his program.  We  renewed his prescriptions.  We will see him back in April 2010.  Incidentally, today is his birthday!     Thomas C. Daleen Squibb, MD, [facility]  Electronically Signed    TCW/MedQ  DD: 05/10/2007  DT: 05/10/2007  Job #: [id number]   cc:   Sharlot Gowda, M.D.

## 2010-05-28 NOTE — Assessment & Plan Note (Signed)
[facility]                            CARDIOLOGY OFFICE NOTE   MICHELLE, VANHISE                    MRN:          [id number]  DATE:02/15/2007                            DOB:          1950-11-09    Mr. Trickey returns today after being discharged on January 28, 2007  after coronary bypass grafting.  He had 5 grafts placed by paid placed  by Dr. Cornelius Moras including a left internal mammary graft to the LAD, free  right internal mammary graft to distal right, saphenous vein graft to  the second diagonal, saphenous vein graft to first circumflex marginal,  sequential vein graft to the posterior lateral branch.   He is done remarkably well.  He recently saw Dr. Cornelius Moras who was, as he  put, amazed at his speedy recovery.  He still smoking.  He says that  around the house with a bunch of smokers, that is all he can do.  He is  getting ready to go to the [place] to do some striper fishing.  He is  anxious to get out.   He seems to be compliant with his medications.  He is currently taking  simvastatin 20 mg q.h.s. will be due lipids in the next 2 to 4 weeks,  atenolol 50 mg p.o. b.i.d., aspirin 325 mg a day, verapamil male 240 mg  a day.   His blood pressure is 143/79, his pulse 59 and regular.  Electrocardiogram shows sinus bradycardia, ST-segment changes in I and  AVL, V4-V6 consistent with LVH and repolarization changes.  His weight  is 208 down 4.  His skin is pale and dry.  HEENT:  Unchanged.  Carotid upstrokes about equal bilateral bruits.  There is no JVD.  Thyroid is not enlarged.  Trachea is midline.  LUNGS:  Reveal a rub in the right base that is not painful.  Sternotomy  site is healing appropriately.  HEART:  Reveals a nondisplaced PMI.  Normal S1-S2.  Soft systolic murmur  at the base.  Abdominal exam is soft, good bowel sounds.  EXTREMITIES:  Reveal no significant edema.  Pulses are present.  NEURO:  Exam is intact.   I have had a nice chat  with Mr. Depree today.  I have made no changes  in medical program.  I have asked him to return for fasting blood work  including lipids and a comprehensive metabolic panel in about 2 to 3  weeks.  I will see him back again in 2 months.     Thomas C. Daleen Squibb, MD, [facility]  Electronically Signed   TCW/MedQ  DD: 02/15/2007  DT: 02/16/2007  Job #: [id number]   cc:   Salvatore Decent. Cornelius Moras, M.D.  Sharlot Gowda, M.D.

## 2010-05-28 NOTE — Assessment & Plan Note (Signed)
OFFICE VISIT   Larry, HARD Rangel  DOB:  May 10, 1950                                        February 15, 2007  CHART #:  [id number]   HISTORY OF PRESENT ILLNESS:  Larry Rangel returns for routine followup,  status post coronary artery bypass grafting x5 on January 26, 2007.  His  postoperative recovery has been uneventful and he returns for a routine  followup today.  Overall, Larry Rangel reports doing quite well.  He has  had minimal residual soreness in his chest and he is not taking any pain  medicine.  He denies any fevers or chills.  He has no shortness of  breath.  His appetite is good.  He is eager to start increasing his  activity.  He has no complaints.   PHYSICAL EXAMINATION:  General/Vital Signs:  Physical exam is notable  for a well-appearing male with blood pressure of 137/78, pulse 62,  oxygen saturation 97% on room air.  Chest:  Examination is notable for a  median sternotomy incision that is healing nicely.  The sternum is  stable on palpation.  Breath sounds are clear to auscultation  bilaterally and symmetrical bilaterally.  No wheezes or rhonchi are  demonstrated.  Cardiovascular:  Exam includes a regular rate and rhythm.  No murmurs, rubs, or gallops are appreciated.  Abdomen:  Soft and  nontender.  Extremities:  The extremities are warm and well perfused.  The small incision from endoscopic vein harvest from the right leg has  healed well.  There is no lower extremity edema.  The remainder of his  physical exam is unremarkable.   DIAGNOSTIC TESTS:  Chest x-ray obtained today at the [facility] is reviewed; this demonstrates clear lungs bilaterally with  trivial pleural effusions.  All the sternal wires appear intact.  No  other abnormalities are noted.   IMPRESSION:  Excellent progress following recent coronary artery bypass  grafting.   PLAN:  I have encouraged Larry Rangel to continue to gradually increase  his  physical activity over the coming weeks with his only specific  limitation at this time remaining that he refrain from heavy lifting or  strenuous use of his arms or shoulders for at least another 2 months.  I  think he can resume driving an automobile.  We have not made any changes  in his current medications.  All of his questions have been addressed.  He has been reminded to stay away from tobacco use.  In the future, Mr.  Rangel will call and return to see [place] here only should further problems  or difficulties arise.   Salvatore Decent. Cornelius Moras, M.D.  Electronically Signed   CHO/MEDQ  D:  02/15/2007  T:  02/16/2007  Job:  [id number]   cc:   Thomas C. Daleen Squibb, MD, [facility]  Sharlot Gowda, M.D.  Veverly Fells. Excell Seltzer, MD

## 2010-05-31 NOTE — Cardiovascular Report (Signed)
NAMEBRIGHTEN, ORNDOFF                       ACCOUNT NO.:  [id number]   MEDICAL RECORD NO.:  [id number]                   PATIENT TYPE:  INP   LOCATION:  2016                                 FACILITY:  [facility]   PHYSICIAN:  W. Ashley Royalty., M.D.         DATE OF BIRTH:  05-16-1950   DATE OF PROCEDURE:  05/24/2003  DATE OF DISCHARGE:  05/25/2003                              CARDIAC CATHETERIZATION   INDICATIONS:  Chest discomfort, multiple risk factors, mildly elevated  troponins.   HISTORY:  A 60 year old male with longstanding hypertension, severe  situational stress and heavy cigarette abuse.   PROCEDURE:  Left heart catheterization with coronary angiograms and left  ventriculogram.   COMMENTS ABOUT PROCEDURE:  The patient tolerated the procedure well without  complications.  Following the procedure he had good hemostasis and  peripheral pulses noted.   HEMODYNAMIC DATA:  1. Aorta post contrast 155/69.  2. LV post contrast 155/2-14.   ANGIOGRAPHIC DATA:  1. Left ventriculogram:  Performed in the 30 degree RAO projection.  The     aortic valve was normal.  The mitral valve appears normal.  Left     ventricle appears normal in size.  Wall motion is normal.  Estimated     ejection fraction is 70%.  Coronary arteries arise and distribute     normally.  There is mild calcification noted involving the left coronary     system as well as the mid right coronary artery.  The left main coronary     artery is normal.  2. Left anterior descending:  There is a 30% proximal narrowing.  This is     followed by an area of segmental 60% narrowing in an angulated segment     which then gives off a diagonal branch.  There is moderate tortuosity of     the distal vessel.  The circumflex is a large vessel and the first     marginal branch has mild 20-30% proximal narrowing.  The vessel is widely     patent otherwise and also gives off a large second marginal branch.  The     right  coronary artery is moderately tortuous.  There is a 40% mid vessel     stenosis with some calcification and moderate irregularity in the     remaining parts of the vessel.  The vessel terminates in a posterior     descending artery which is large and supplies the apex.   IMPRESSION:  Moderate to moderately severe three vessel coronary artery  disease with stenosis involving the proximal left anterior descending, mid  right coronary artery and mild disease involving the circumflex.   RECOMMENDATIONS:  The stenosis did not appear to be suitable for stenting  and is not diffuse enough for cardiac surgery at this time.  It is  recommended that he have intensive risk factor modification to include  cigarette cessation, control of lips and control of blood  pressure.  He was  somewhat bradycardic during the procedure and his verapamil will be  discontinued and he will be placed on Norvasc for control of blood pressure.                                               Darden Palmer., M.D.    WST/MEDQ  D:  05/24/2003  T:  05/25/2003  Job:  [id number]   cc:   Sharlot Gowda, M.D.  [address]  [place], [place] [address]  Fax: [phone]

## 2010-05-31 NOTE — Discharge Summary (Signed)
Larry Rangel, Larry Rangel                       ACCOUNT NO.:  [id number]   MEDICAL RECORD NO.:  [id number]                   PATIENT TYPE:  INP   LOCATION:  2016                                 FACILITY:  [facility]   PHYSICIAN:  Darden Palmer., M.D.         DATE OF BIRTH:  Sep 11, 1950   DATE OF ADMISSION:  05/23/2003  DATE OF DISCHARGE:  05/25/2003                                 DISCHARGE SUMMARY   FINAL DIAGNOSES:  1. Chest pain consistent with unstable angina pectoris. Mild to moderate     coronary artery disease without severe focal stenoses noted.  2. Hypertension.  3. Hyperlipidemia.  4. Significant situational stress.   PROCEDURE:  Cardiac catheterization.   HISTORY:  A 60 year old male with life long hypertension and severe  situational stress. He got married around eight weeks ago and has had  enormous situational stress related to the marriage and is now separated.  Over the past several days and the week, he has had midsternal discomfort  described as pressure occurring on and off, not related to activity. It was  worse with stress, persisted 12 hours, and he was brought to the emergency  room. He was admitted to rule out a myocardial infarction. Please see the  previously dictated history and physical for the remainder of the details.   HOSPITAL COURSE:  Chest x-ray shows borderline cardiomegaly and mild changes  of COPD. EKG showed marked sinus bradycardia with nonspecific ST abnormality  in the lateral leads. Hemoglobin A1c was 5.4. Potassium was 2.9 on  admission. White count was 9,100, hemoglobin 16.3, hematocrit 47. Lipid  panel was done and is pending at the time of dictation. CPK-MB was negative;  troponin was 0.07, 0.08, 0.06. The patient was admitted and begun on  Lovenox. He received smoking cessation counseling. He underwent cardiac  catheterization on May 24, 2003 with findings of a 60% LAD stenosis,  proximal 30% stenosis, mild irregularities in the  circumflex, and a 40%  right coronary artery stenosis. He had no recurrent of chest pain but was  noted to be markedly bradycardia and was taken off of verapamil. The results  were discussed with the patient. He has severe cigarette abuse, and we  discussed the importance of stopping this. He has normal left ventricular  function but also multiple risk factors for heart disease including family  history, hypertension, hyperlipidemia, and cigarette abuse. He also has  significant situational ongoing stress going on right now. It was  recommended that he stop smoking, and he was also seen by cardiac rehab.   DISPOSITION:  He was discharged at this time in improved condition.   DISCHARGE MEDICATIONS:  1. Tenoretic 100/25 daily.  2. Potassium 40 mEq.  3. Plavix 75 mg daily.  4. Aspirin 81 mg daily.  5. Vytorin 10/40 daily.  6. Extended release nifedipine 60 mg daily.   DISCHARGE INSTRUCTIONS:  He is to walk daily and is to see me  in followup in  one week. We talked also about measures to reduce stress.                                                Darden Palmer., M.D.    WST/MEDQ  D:  05/25/2003  T:  05/25/2003  Job:  [id number]   cc:   Sharlot Gowda, M.D.  [address]  [place], [place] [address]  Fax: [phone]

## 2010-05-31 NOTE — H&P (Signed)
Larry Rangel, Larry Rangel                       ACCOUNT NO.:  [id number]   MEDICAL RECORD NO.:  [id number]                   PATIENT TYPE:  INP   LOCATION:  2016                                 FACILITY:  [facility]   PHYSICIAN:  Darden Palmer., M.D.         DATE OF BIRTH:  1950/12/22   DATE OF ADMISSION:  05/23/2003  DATE OF DISCHARGE:                                HISTORY & PHYSICAL   REASON FOR ADMISSION:  Chest pain.   HISTORY OF PRESENT ILLNESS:  This is a 60 year old male with lifelong  hypertension and severe situational stress.  He evidently got married around  eight weeks ago and has had enormous situational stress related to the  marriage and is now separated.  He has been living at friends' houses, on  his boat, and at transient places.  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  Over the past several days and the week he has had  midsternal discomfort described as pressure.  It has occurred on and off not  necessarily related to activity.  The discomfort became more pronounced  yesterday and he attributed it to stress, but it persisted around 12 hours  yesterday.  He drank wine last night and went to sleep and woke up this  morning with some persistent chest discomfort and presented to his medical  doctor's office, where he was sent to the emergency room.  Initial enzymes  were negative, but a troponin was 0.07 and I was asked to consult on him.  At the present time he is pain free after having been given Lovenox  injections.  He is a somewhat vague historian and does note significant  situational stress related to the recent marriage which is now his fourth  marriage.  He does smoke heavily and does have a positive family history of  cardiac disease.  He normally is active, playing both roller hockey and ice  hockey and normally is physically active without chest discomfort/   PAST MEDICAL HISTORY:  1. Remarkable for hypertension.  2.  Borderline cholesterol.  3. He has significant situational stress, but denies any significant other     psychiatric problems.   PAST SURGICAL HISTORY:  Shoulder surgery.   ALLERGIES:  None.   CURRENT MEDICATIONS:  1. Tenoretic 100/25 daily.  2. Verapamil 360 mg daily.   FAMILY HISTORY:  Father died at age 7 of congestive heart failure and  previous implantable defibrillator.  He had a myocardial infarction and  sudden death at a fairly young age.  Mother is 74, has a history of coronary  artery disease and previous angioplasty.  He is an only child.   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  He smokes between one half to one pack of cigarettes  per day, drinks  two bottles of wine per week.  He has been married four  times  previously, the first for 11 years and has two children by that  marriage which are grown, the second time for 11 years and has two children  by that marriage.  Wife has custody in [place].  A third marriage was  4-1/2 years and most recent marriage was for the past eight weeks and he is  currently estranged from that person.  There is no history of illicit drug  use.   REVIEW OF SYSTEMS:  HEENT:  His weight has been stable at about 185 pounds.  HEENT:  He has no skin problems, no eye, ear, nose, or throat problems.  No  glaucoma, cataracts, or macular degeneration.  He denies any eye, ear, nose,  or throat problems.  He had some diarrhea when he lived in [place] several  years ago, but has no GI problems.  He has no GU problems or impotence.  He  has some mild arthritis involving his shoulder as well as his hands.  He  denies any TIA, stroke, or bleeding problems.  No significant allergies.  Other than as noted above the remainder of the review of systems is  unremarkable.   PHYSICAL EXAMINATION:  GENERAL:  He is an anxious-appearing,  thin male who  is tanned and is in no acute distress.  VITAL SIGNS:  His blood pressure was 150/100 initially.  Weight was 187.  Oxygen saturations were 98% on room air.  SKIN:  Warm and dry.  HEENT  EOMI, PERRLA, CNS clear.  Pharynx is negative.  NECK:  Supple without masses.  No JVD, thyromegaly, or bruits.  LUNGS:  Clear to A&P.  CARDIAC:  Normal S1 and S2, no S3, S4, or murmur.  ABDOMEN:  Soft, nontender.  No masses, hepatosplenomegaly, or aneurysm.  EXTREMITIES:  Femoral and distal pulses are 2+ without bruits.  NEUROLOGIC:  Intact, but he appears to be quite anxious.   LABORATORY DATA:  A 12 lead EKG shows voltage for LVH with nonspecific  changes laterally.  Chest x-ray was normal.   Potassium is 2.9.  His troponin was 0.07.  CBC was normal.   IMPRESSION:  1. Chest discomfort with features suggestive of unstable angina.  2. Longstanding hypertension.  3. Cigarette abuse, chronic obstructive pulmonary disease.  4. Severe situational stress and anxiety.  5. Hypokalemia.   RECOMMENDATIONS:  He is admitted to a telemetry bed.  He has recurrent pain  and will be begun on IV nitroglycerin.  Serial cardiac enzymes and EKGs will  be obtained.  His potassium will be repleted.  Continue beta blockers and  verapamil.  If recurrent pain, IV nitroglycerin.  Begin Lovenox.  I think we  need to go ahead with the cardiac catheterization on him and have discussed  this with him fully.  The procedure was discussed with him fully including  risks of myocardial infarction or death and he is agreeable and willing to  proceed.                                                Darden Palmer., M.D.    WST/MEDQ  D:  05/23/2003  T:  05/23/2003  Job:  [id number]   cc:   Sharlot Gowda, M.D.  [address]  [place], [place] [address]  Fax:  [phone] 

## 2010-07-19 ENCOUNTER — Other Ambulatory Visit: Payer: Self-pay | Admitting: Family Medicine

## 2010-07-19 ENCOUNTER — Telehealth: Payer: Self-pay

## 2010-07-19 NOTE — Telephone Encounter (Signed)
TALKED WITH WIFE OR MOM TOLD HER PT NEEDS APPT. PER JCL

## 2010-07-19 NOTE — Telephone Encounter (Signed)
He needs an appointment

## 2010-07-19 NOTE — Telephone Encounter (Signed)
Is this ok?

## 2010-08-07 ENCOUNTER — Telehealth: Payer: Self-pay | Admitting: *Deleted

## 2010-08-07 NOTE — Telephone Encounter (Signed)
Wife informed, Pt will be home soon and they will call back to sch OV

## 2010-08-07 NOTE — Telephone Encounter (Signed)
Patient requesting RF of klonopin 0.5 mg bid prn (last given #60 w/5 rfs in January)

## 2010-08-07 NOTE — Telephone Encounter (Signed)
Due for an OV.

## 2010-08-09 ENCOUNTER — Other Ambulatory Visit (INDEPENDENT_AMBULATORY_CARE_PROVIDER_SITE_OTHER): Payer: BC Managed Care – PPO

## 2010-08-09 ENCOUNTER — Encounter: Payer: Self-pay | Admitting: Internal Medicine

## 2010-08-09 ENCOUNTER — Other Ambulatory Visit: Payer: Self-pay | Admitting: Internal Medicine

## 2010-08-09 ENCOUNTER — Ambulatory Visit (INDEPENDENT_AMBULATORY_CARE_PROVIDER_SITE_OTHER): Payer: BC Managed Care – PPO | Admitting: Internal Medicine

## 2010-08-09 DIAGNOSIS — F411 Generalized anxiety disorder: Secondary | ICD-10-CM

## 2010-08-09 DIAGNOSIS — I25810 Atherosclerosis of coronary artery bypass graft(s) without angina pectoris: Secondary | ICD-10-CM

## 2010-08-09 DIAGNOSIS — Z0000 Encounter for general adult medical examination without abnormal findings: Secondary | ICD-10-CM

## 2010-08-09 DIAGNOSIS — I10 Essential (primary) hypertension: Secondary | ICD-10-CM

## 2010-08-09 DIAGNOSIS — E785 Hyperlipidemia, unspecified: Secondary | ICD-10-CM

## 2010-08-09 DIAGNOSIS — K921 Melena: Secondary | ICD-10-CM

## 2010-08-09 LAB — CBC WITH DIFFERENTIAL/PLATELET
Basophils Absolute: 0.1 10*3/uL (ref 0.0–0.1)
Eosinophils Relative: 3.2 % (ref 0.0–5.0)
HCT: 50.4 % (ref 39.0–52.0)
Hemoglobin: 17.3 g/dL — ABNORMAL HIGH (ref 13.0–17.0)
Lymphocytes Relative: 33.3 % (ref 12.0–46.0)
Lymphs Abs: 2.9 10*3/uL (ref 0.7–4.0)
Monocytes Relative: 5.2 % (ref 3.0–12.0)
Neutro Abs: 5 10*3/uL (ref 1.4–7.7)
Platelets: 151 10*3/uL (ref 150.0–400.0)
WBC: 8.6 10*3/uL (ref 4.5–10.5)

## 2010-08-09 LAB — COMPREHENSIVE METABOLIC PANEL
ALT: 145 U/L — ABNORMAL HIGH (ref 0–53)
CO2: 27 mEq/L (ref 19–32)
Calcium: 8.7 mg/dL (ref 8.4–10.5)
Chloride: 100 mEq/L (ref 96–112)
GFR: 108.72 mL/min (ref >60.00)
Glucose, Bld: 242 mg/dL — ABNORMAL HIGH (ref 70–99)
Sodium: 138 mEq/L (ref 135–145)
Total Bilirubin: 1.1 mg/dL (ref 0.3–1.2)
Total Protein: 7.2 g/dL (ref 6.0–8.3)

## 2010-08-09 LAB — URINALYSIS, ROUTINE W REFLEX MICROSCOPIC
Bilirubin Urine: NEGATIVE
Hgb urine dipstick: NEGATIVE
Ketones, ur: NEGATIVE
Leukocytes, UA: NEGATIVE
Specific Gravity, Urine: 1.02 (ref 1.000–1.030)
Urine Glucose: >=1000
Urobilinogen, UA: 1 (ref 0.0–1.0)

## 2010-08-09 LAB — LDL CHOLESTEROL, DIRECT: Direct LDL: 55.3 mg/dL

## 2010-08-09 LAB — TSH: TSH: 0.69 u[IU]/mL (ref 0.35–5.50)

## 2010-08-09 LAB — LIPID PANEL: Cholesterol: 124 mg/dL (ref 0–200)

## 2010-08-09 MED ORDER — ALPRAZOLAM 1 MG PO TABS: 1.0000 mg | ORAL_TABLET | Freq: Every evening | ORAL | Status: DC | PRN

## 2010-08-09 MED ORDER — CLONAZEPAM 0.5 MG PO TABS: 0.5000 mg | ORAL_TABLET | Freq: Two times a day (BID) | ORAL | Status: DC | PRN

## 2010-08-09 NOTE — Progress Notes (Signed)
Subjective:    Patient ID: Larry Rangel, male    DOB: 11-28-1950, 60 y.o.   MRN: [id number]  Hypertension This is a chronic problem. The current episode started more than 1 year ago. The problem is unchanged. The problem is controlled. Associated symptoms include anxiety. Pertinent negatives include no blurred vision, chest pain, headaches, malaise/fatigue, neck pain, orthopnea, palpitations, peripheral edema, PND, shortness of breath or sweats. There are no associated agents to hypertension. Past treatments include beta blockers and angiotensin blockers. The current treatment provides moderate improvement. Compliance problems include exercise and diet.  There is no history of chronic renal disease.  Hyperlipidemia This is a chronic problem. The current episode started more than 1 year ago. The problem is controlled. Recent lipid tests were reviewed and are variable. Exacerbating diseases include obesity. He has no history of chronic renal disease, diabetes, hypothyroidism, liver disease or nephrotic syndrome. Factors aggravating his hyperlipidemia include beta blockers, fatty foods and smoking. Pertinent negatives include no chest pain, focal sensory loss, focal weakness, leg pain, myalgias or shortness of breath. Current antihyperlipidemic treatment includes statins. The current treatment provides moderate improvement of lipids. Compliance problems include adherence to exercise and adherence to diet.       Review of Systems  Constitutional: Negative for fever, chills, malaise/fatigue, diaphoresis, activity change, appetite change, fatigue and unexpected weight change.  HENT: Negative for sore throat, facial swelling, trouble swallowing, neck pain and voice change.   Eyes: Negative for blurred vision, photophobia, redness and visual disturbance.  Respiratory: Negative for apnea, cough, choking, chest tightness, shortness of breath, wheezing and stridor.   Cardiovascular: Negative for chest  pain, palpitations, orthopnea, leg swelling and PND.  Gastrointestinal: Positive for anal bleeding and rectal pain. Negative for nausea, vomiting, abdominal pain, diarrhea, constipation, blood in stool and abdominal distention.  Genitourinary: Negative for dysuria, urgency, frequency, hematuria, flank pain, decreased urine volume, discharge, penile swelling, scrotal swelling, enuresis, difficulty urinating, genital sores, penile pain and testicular pain.  Musculoskeletal: Negative for myalgias, back pain, joint swelling, arthralgias and gait problem.  Skin: Negative for color change, pallor, rash and wound.  Neurological: Negative for dizziness, tremors, focal weakness, seizures, syncope, facial asymmetry, speech difficulty, weakness, light-headedness, numbness and headaches.  Hematological: Negative for adenopathy. Does not bruise/bleed easily.  Psychiatric/Behavioral: Negative for suicidal ideas, hallucinations, behavioral problems, confusion, sleep disturbance, self-injury, dysphoric mood, decreased concentration and agitation. The patient is nervous/anxious. The patient is not hyperactive.        Objective:   Physical Exam  Vitals reviewed. Constitutional: He is oriented to person, place, and time. He appears well-developed and well-nourished. No distress.  HENT:  Head: Normocephalic and atraumatic.  Right Ear: External ear normal.  Left Ear: External ear normal.  Nose: Nose normal.  Mouth/Throat: Oropharynx is clear and moist. No oropharyngeal exudate.  Eyes: Conjunctivae and EOM are normal. Pupils are equal, round, and reactive to light. Right eye exhibits no discharge. Left eye exhibits no discharge. No scleral icterus.  Neck: Normal range of motion. Neck supple. No JVD present. No tracheal deviation present. No thyromegaly present.  Cardiovascular: Normal rate, regular rhythm, normal heart sounds and intact distal pulses.  Exam reveals no gallop and no friction rub.   No murmur  heard. Pulmonary/Chest: Effort normal and breath sounds normal. No stridor. No respiratory distress. He has no wheezes. He has no rales. He exhibits no tenderness.  Abdominal: Soft. Bowel sounds are normal. He exhibits no distension and no mass. There is no tenderness. There is  no rebound and no guarding. Hernia confirmed negative in the right inguinal area and confirmed negative in the left inguinal area.  Genitourinary: Testes normal and penis normal. Rectal exam shows internal hemorrhoid. Rectal exam shows no external hemorrhoid, no fissure, no mass, no tenderness and anal tone normal. Guaiac positive stool. Prostate is enlarged (1+ smooth bilateral hypertrophy). Prostate is not tender. Right testis shows no mass, no swelling and no tenderness. Right testis is descended. Cremasteric reflex is not absent on the right side. Left testis shows no mass, no swelling and no tenderness. Left testis is descended. Cremasteric reflex is not absent on the left side. Circumcised. No phimosis, paraphimosis, hypospadias, penile erythema or penile tenderness. No discharge found.  Musculoskeletal: Normal range of motion. He exhibits no edema and no tenderness.  Lymphadenopathy:    He has no cervical adenopathy.       Right: No inguinal adenopathy present.       Left: No inguinal adenopathy present.  Neurological: He is alert and oriented to person, place, and time. He has normal reflexes. He displays normal reflexes. No cranial nerve deficit. He exhibits normal muscle tone. Coordination normal.  Skin: Skin is warm and dry. No rash noted. He is not diaphoretic. No erythema. No pallor.  Psychiatric: He has a normal mood and affect. His behavior is normal. Judgment and thought content normal.      Lab Results  Component Value Date   WBC 11.4* 12/21/2006   HGB 15.9 12/21/2006   HCT 45.5 12/21/2006   PLT 233 12/21/2006   CHOL 112 03/25/2007   TRIG 226* 03/25/2007   HDL 30.3* 03/25/2007   LDLDIRECT 56.8 03/25/2007    ALT 29 03/25/2007   AST 24 03/25/2007   NA 142 03/25/2007   K 3.3* 03/25/2007   CL 106 03/25/2007   CREATININE 0.9 03/25/2007   BUN 10 03/25/2007   CO2 29 03/25/2007   INR 1.1 RATIO* 12/21/2006      Assessment & Plan:   No problem-specific assessment & plan notes found for this encounter.

## 2010-08-09 NOTE — Assessment & Plan Note (Signed)
He is tolerating the zocor well, today I will check his FLP

## 2010-08-09 NOTE — Assessment & Plan Note (Signed)
He describes intermittent hemorrhoidal pain and bleeding and he tells me that he has had a normal colonoscopy in the last few years so I will not pursue any further testing for now, I will check his CBC today

## 2010-08-09 NOTE — Patient Instructions (Addendum)
Coronary Artery Disease Coronary artery disease (CAD) happens when the arteries of the heart become narrow and hardened. Build-up of plaque in the walls of the vessels blocks blood flow. This can cause minor chest pain (angina) if the blockage is partial. A heart attack or MI (myocardial infarction) happens when the artery is completely blocked. MI's can lead to shock, heart failure, and sudden death. CAD is the leading cause of death in men and women.  CAUSES The risk for getting CAD can be inherited. There are other risk factors that can be helped. These factors include:  Cigarette smoking.   Diabetes.   Cholesterol.   High blood pressure.  Being overweight and not exercising regularly also increase your risk for having CAD. Symptoms include:  Chest pain.  Shortness of breath.   Weakness.  Nausea.   Fainting.   DIAGNOSIS The diagnosis may require:  An EKG.  Stress test.   Chest X-ray.  Cardiac scan.   Echocardiogram.   A coronary angiogram.   TREATMENT Treatment includes immediate measures for symptom relief. Medications for chest pain, cholesterol reduction, blood pressure control, and aspirin to prevent clotting may be needed. Coronary angioplasty (using a balloon to open a blockage in a coronary artery) is helpful in managing MI's. It is also useful for some patients with angina. Loosing weight, controlling the blood sugar, and not smoking are also important to long-term success. The optimal diet should emphasize fruits and vegetables. Sodas, deep-fried foods and sweets should be avoided. Call your caregiver or the American Heart Association if you want further information.  SEEK IMMEDIATE MEDICAL CARE IF YOU DEVELOP:  Severe chest pain or pain that does not improve with rest and medication.   Pain that radiates into the arms, neck, jaw or back.   Fainting, shortness of breath, severe palpitations, or vomiting.   Marked sweating.  Document Released: 02/07/2004  Document Re-Released: 03/26/2009 [facility] Patient Information 2011 [place], [place].Health Maintenance in Males MAINTAIN REGULAR HEALTH EXAMS  Maintain a healthy diet and normal weight. Increased weight leads to problems with blood pressure and diabetes. Decrease fat in the diet and increase exercise. Obtain a proper diet from your caregiver if necessary.   High blood pressure causes heart and blood vessel problems. Check blood pressures regularly and keep your blood pressure at normal limits. Aerobic exercise helps this. Persistent elevations of blood pressure should be treated with medications if weight loss and exercise are ineffective.   Avoid smoking, drinking in excess (more than 2 drinks per day), or use of street drugs. Do not share needles with anyone. Ask for help if you need assistance or instructions on stopping the use of alcohol, cigarettes, or drugs.   Maintain normal blood lipids and cholesterol. Your caregiver can give you information to lower your risk of heart disease or stroke.   Ask your caregiver if you are in need of early heart disease screening because of a strong family history of heart disease or signs of elevated testosterone (male sex hormone) levels. These can predispose you to early heart disease.   Practice safe sex. Practicing safe sex decreases your risk for a sexually transmitted infection (STI). Some of the STIs are gonorrhea, chlamydia, syphilis, trichimonas, herpes, human papillomavirus (HPV), and human immunodeficiency virus (HIV). Herpes, HIV, and HPV are viral illnesses that have no cure. These can result in disability, cancer, and death.   It is not safe for someone who has AIDS or is HIV positive to have unprotected sex with a partner  who is HIV positive. The reason for this is the fact that there are many different strains of HIV. If you have a strain that is readily treated with medications and then suddenly introduce a strain from a partner that has no  further treatment options, you may suddenly have a strain of HIV that is untreatable. Even if you are both positive for HIV, it is still necessary to practice safe sex.   Use sunscreen with a SPF of 15 or greater. Being outside in the sun when your shadow caused by the sun is shorter than you are, means you are being exposed to sun at greater intensity. Lighter skinned people are at a greater risk of skin cancer.   Keep carbon monoxide and smoke detectors in your home and functioning at all times. Change the batteries every 6 months.   Do monthly examinations of your testicles. The best time to do this is after a hot shower or bath when the tissues are loose. Notify your caregivers of any lumps, tenderness, or changes in size or shape.   Notify your caregiver of new moles or changes in moles, especially if there is a change in shape or color. Also notify your caregiver if a mole is larger than the size of a pencil eraser.   Stay current with your tetanus shots and other required immunizations.  The Body Mass Index (BMI) is a way of measuring how much of your body is fat. Having a BMI above 27 increases the risk of heart disease, diabetes, hypertension, stroke, and other problems related to obesity. Document Released: 06/28/2007 Document Re-Released: 06/19/2009 [facility] Patient Information 2011 [place], [place].

## 2010-08-09 NOTE — Assessment & Plan Note (Signed)
Continue current meds 

## 2010-08-09 NOTE — Assessment & Plan Note (Signed)
I will ask for a cardiology f/up to see if he is due for any evaluation or f/up testing

## 2010-08-09 NOTE — Assessment & Plan Note (Signed)
His exam has been done and labs are ordered, he was given pt ed material

## 2010-08-09 NOTE — Assessment & Plan Note (Addendum)
He is doing well on his current regimen so will continue that and I will check his lytes and renal function today

## 2010-08-09 NOTE — Assessment & Plan Note (Signed)
He appears to be doing well on simvastatin, I will check his FLP and CMP today

## 2010-08-14 ENCOUNTER — Ambulatory Visit: Payer: BC Managed Care – PPO | Admitting: Cardiology

## 2010-08-23 ENCOUNTER — Ambulatory Visit (INDEPENDENT_AMBULATORY_CARE_PROVIDER_SITE_OTHER): Payer: BC Managed Care – PPO | Admitting: Internal Medicine

## 2010-08-23 ENCOUNTER — Telehealth: Payer: Self-pay | Admitting: *Deleted

## 2010-08-23 ENCOUNTER — Other Ambulatory Visit (INDEPENDENT_AMBULATORY_CARE_PROVIDER_SITE_OTHER): Payer: BC Managed Care – PPO

## 2010-08-23 ENCOUNTER — Encounter: Payer: Self-pay | Admitting: Internal Medicine

## 2010-08-23 VITALS — BP 168/89 | HR 57 | Temp 97.1°F | Resp 16 | Wt 231.0 lb

## 2010-08-23 DIAGNOSIS — R945 Abnormal results of liver function studies: Secondary | ICD-10-CM

## 2010-08-23 DIAGNOSIS — E785 Hyperlipidemia, unspecified: Secondary | ICD-10-CM

## 2010-08-23 DIAGNOSIS — F411 Generalized anxiety disorder: Secondary | ICD-10-CM

## 2010-08-23 DIAGNOSIS — I10 Essential (primary) hypertension: Secondary | ICD-10-CM

## 2010-08-23 DIAGNOSIS — R7402 Elevation of levels of lactic acid dehydrogenase (LDH): Secondary | ICD-10-CM

## 2010-08-23 HISTORY — DX: Abnormal results of liver function studies: R94.5

## 2010-08-23 LAB — COMPREHENSIVE METABOLIC PANEL
ALT: 158 U/L — ABNORMAL HIGH (ref 0–53)
Albumin: 4.1 g/dL (ref 3.5–5.2)
CO2: 28 mEq/L (ref 19–32)
Calcium: 9.1 mg/dL (ref 8.4–10.5)
Chloride: 105 mEq/L (ref 96–112)
GFR: 89.64 mL/min (ref >60.00)
Glucose, Bld: 180 mg/dL — ABNORMAL HIGH (ref 70–99)
Sodium: 141 mEq/L (ref 135–145)
Total Protein: 7.2 g/dL (ref 6.0–8.3)

## 2010-08-23 MED ORDER — NIACIN ER (ANTIHYPERLIPIDEMIC) 1000 MG PO TBCR: 1000.0000 mg | EXTENDED_RELEASE_TABLET | Freq: Every day | ORAL | Status: DC

## 2010-08-23 MED ORDER — NEBIVOLOL HCL 20 MG PO TABS: 1.0000 | ORAL_TABLET | Freq: Every day | ORAL | Status: DC

## 2010-08-23 MED ORDER — AMLODIPINE BESYLATE-VALSARTAN 5-320 MG PO TABS: 1.0000 | ORAL_TABLET | Freq: Every day | ORAL | Status: DC

## 2010-08-23 NOTE — Telephone Encounter (Signed)
Pt has had elevated BP for four days. Pt was prescribed Atenolol 50mg  1 tablet bid. Pt has been taking 2 tablets bid since his BP has been elevated and today his reading was 186/98. I advised pt's wife that pt needs an office visit. Pt denies headaches, chest pain, SOB, nausea or vomitting. What do you advise for pt

## 2010-08-23 NOTE — Patient Instructions (Signed)
Hypertriglyceridemia  Diet for High blood levels of Triglycerides Most fats in food are triglycerides. Triglycerides in your blood are stored as fat in your body. High levels of triglycerides in your blood may put you at a greater risk for heart disease and stroke.  Normal triglyceride levels are less than 150 mg/dL. Borderline high levels are 150-199 mg/dl. High levels are 200 - 499 mg/dL, and very high triglyceride levels are greater than 500 mg/dL. The decision to treat high triglycerides is generally based on the level. For people with borderline or high triglyceride levels, treatment includes weight loss and exercise. Drugs are recommended for people with very high triglyceride levels. Many people who need treatment for high triglyceride levels have metabolic syndrome. This syndrome is a collection of disorders that often include: insulin resistance, high blood pressure, blood clotting problems, high cholesterol and triglycerides. TESTING PROCEDURE FOR TRIGLYCERIDES  You should not eat 4 hours before getting your triglycerides measured. The normal range of triglycerides is between 10 and 250 milligrams per deciliter (mg/dl). Some people may have extreme levels (1000 or above), but your triglyceride level may be too high if it is above 150 mg/dl, depending on what other risk factors you have for heart disease.   People with high blood triglycerides may also have high blood cholesterol levels. If you have high blood cholesterol as well as high blood triglycerides, your risk for heart disease is probably greater than if you only had high triglycerides. High blood cholesterol is one of the main risk factors for heart disease.  CHANGING YOUR DIET  Your weight can affect your blood triglyceride level. If you are more than 20% above your ideal body weight, you may be able to lower your blood triglycerides by losing weight. Eating less and exercising regularly is the best way to combat this. Fat provides  more calories than any other food. The best way to lose weight is to eat less fat. Only 30% of your total calories should come from fat. Less than 7% of your diet should come from saturated fat. A diet low in fat and saturated fat is the same as a diet to decrease blood cholesterol. By eating a diet lower in fat, you may lose weight, lower your blood cholesterol, and lower your blood triglyceride level.  Eating a diet low in fat, especially saturated fat, may also help you lower your blood triglyceride level. Ask your dietitian to help you figure how much fat you can eat based on the number of calories your caregiver has prescribed for you.  Exercise, in addition to helping with weight loss may also help lower triglyceride levels.   Alcohol can increase blood triglycerides. You may need to stop drinking alcoholic beverages.   Too much carbohydrate in your diet may also increase your blood triglycerides. Some complex carbohydrates are necessary in your diet. These may include bread, rice, potatoes, other starchy vegetables and cereals.   Reduce "simple" carbohydrates. These may include pure sugars, candy, honey, and jelly without losing other nutrients. If you have the kind of high blood triglycerides that is affected by the amount of carbohydrates in your diet, you will need to eat less sugar and less high-sugar foods. Your caregiver can help you with this.   Adding 2-4 grams of fish oil (EPA+ DHA) may also help lower triglycerides. Speak with your caregiver before adding any supplements to your regimen.  Following the Diet  Maintain your ideal weight. Your caregivers can help you with a diet. Generally,   eating less food and getting more exercise will help you lose weight. Joining a weight control group may also help. Ask your caregivers for a good weight control group in your area.  Eat low-fat foods instead of high-fat foods. This can help you lose weight too.  These foods are lower in fat. Eat MORE  of these:   Dried beans, peas, and lentils.   Egg whites.   Low-fat cottage cheese.   Fish.   Lean cuts of meat, such as round, sirloin, rump, and flank (cut extra fat off meat you fix).   Whole grain breads, cereals and pasta.   Skim and nonfat dry milk.   Low-fat yogurt.   Poultry without the skin.   Cheese made with skim or part-skim milk, such as mozzarella, parmesan, farmers', ricotta, or pot cheese.   These are higher fat foods. Eat LESS of these:   Whole milk and foods made from whole milk, such as American, blue, cheddar, monterey jack, and swiss cheese   High-fat meats, such as luncheon meats, sausages, knockwurst, bratwurst, hot dogs, ribs, corned beef, ground pork, and regular ground beef.   Fried foods.  Limit saturated fats in your diet. Substituting unsaturated fat for saturated fat may decrease your blood triglyceride level. You will need to read package labels to know which products contain saturated fats.  These foods are high in saturated fat. Eat LESS of these:   Fried pork skins.  Whole milk.   Skin and fat from poultry.   Palm oil.   Butter.   Shortening.   Cream cheese.   Berniece Salines.   Margarines and baked goods made from listed oils.   Vegetable shortenings.   Chitterlings.  Fat from meats.   Coconut oil.   Palm kernel oil.   Lard.   Cream.   Sour cream.   Fatback.   Coffee whiteners and non-dairy creamers made with these oils.   Cheese made from whole milk.   Use unsaturated fats (both polyunsaturated and monounsaturated) moderately. Remember, even though unsaturated fats are better than saturated fats; you still want a diet low in total fat.  These foods are high in unsaturated fat:   Canola oil.  Sunflower oil.   Mayonnaise.   Almonds.   Peanuts.   Pine nuts.   Margarines made with these oils.   Safflower oil.  Olive oil.   Avocados.   Cashews.   Peanut butter.   Sunflower seeds.   Soybean  oil.  Peanut oil.   Olives.   Pecans.   Walnuts.   Pumpkin seeds.   Avoid sugar and other high-sugar foods. This will decrease carbohydrates without decreasing other nutrients. Sugar in your food goes rapidly to your blood. When there is excess sugar in your blood, your liver may use it to make more triglycerides. Sugar also contains calories without other important nutrients.  Eat LESS of these:   Sugar, brown sugar, powdered sugar, jam, jelly, preserves, honey, syrup, molasses, pies, candy, cakes, cookies, frosting, pastries, colas, soft drinks, punches, fruit drinks, and regular gelatin.   Avoid alcohol. Alcohol, even more than sugar, may increase blood triglycerides. In addition, alcohol is high in calories and low in nutrients. Ask for sparkling water, or a diet soft drink instead of an alcoholic beverage.  Suggestions for planning and preparing meals   Bake, broil, grill or roast meats instead of frying.   Remove fat from meats and skin from poultry before cooking.   Add spices, herbs, lemon juice  or vinegar to vegetables instead of salt, rich sauces or gravies.   Use a non-stick skillet without fat or use no-stick sprays.   Cool and refrigerate stews and broth. Then remove the hardened fat floating on the surface before serving.   Refrigerate meat drippings and skim off fat to make low-fat gravies.   Serve more fish.   Use less butter, margarine and other high-fat spreads on bread or vegetables.   Use skim or reconstituted non-fat dry milk for cooking.   Cook with low-fat cheeses.   Substitute low-fat yogurt or cottage cheese for all or part of the sour cream in recipes for sauces, dips or congealed salads.   Use half yogurt/half mayonnaise in salad recipes.   Substitute evaporated skim milk for cream. Evaporated skim milk or reconstituted non-fat dry milk can be whipped and substituted for whipped cream in certain recipes.   Choose fresh fruits for dessert instead  of high-fat foods such as pies or cakes. Fruits are naturally low in fat.  When Dining Out   Order low-fat appetizers such as fruit or vegetable juice, pasta with vegetables or tomato sauce.   Select clear, rather than cream soups.   Ask that dressings and gravies be served on the side. Then use less of them.   Order foods that are baked, broiled, poached, steamed, stir-fried, or roasted.   Ask for margarine instead of butter, and use only a small amount.   Drink sparkling water, unsweetened tea or coffee, or diet soft drinks instead of alcohol or other sweet beverages.  QUESTIONS AND ANSWERS ABOUT OTHER FATS IN THE BLOOD:  SATURATED FAT, TRANS FAT, AND CHOLESTEROL What is trans fat? Trans fat is a type of fat that is formed when vegetable oil is hardened through a process called hydrogenation. This process helps makes foods more solid, gives them shape, and prolongs their shelf life. Trans fats are also called hydrogenated or partially hydrogenated oils.  What do saturated fat, trans fat, and cholesterol in foods have to do with heart disease? Saturated fat, trans fat, and cholesterol in the diet all raise the level of LDL "bad" cholesterol in the blood. The higher the LDL cholesterol, the greater the risk for coronary heart disease (CHD). Saturated fat and trans fat raise LDL similarly.  What foods contain saturated fat, trans fat, and cholesterol? High amounts of saturated fat are found in animal products, such as fatty cuts of meat, chicken skin, and full-fat dairy products like butter, whole milk, cream, and cheese, and in tropical vegetable oils such as palm, palm kernel, and coconut oil. Trans fat is found in some of the same foods as saturated fat, such as vegetable shortening, some margarines (especially hard or stick margarine), crackers, cookies, baked goods, fried foods, salad dressings, and other processed foods made with partially hydrogenated vegetable oils. Small amounts of trans  fat also occur naturally in some animal products, such as milk products, beef, and lamb. Foods high in cholesterol include liver, other organ meats, egg yolks, shrimp, and full-fat dairy products. How can I use the new food label to make heart-healthy food choices? Check the Nutrition Facts panel of the food label. Choose foods lower in saturated fat, trans fat, and cholesterol. For saturated fat and cholesterol, you can also use the Percent Daily Value (%DV): 5% DV or less is low, and 20% DV or more is high. (There is no %DV for trans fat.) Use the Nutrition Facts panel to choose foods low in saturated fat   and cholesterol, and if the trans fat is not listed, read the ingredients and limit products that list shortening or hydrogenated or partially hydrogenated vegetable oil, which tend to be high in trans fat. POINTS TO REMEMBER: YOU NEED A LITTLE TLC (THERAPEUTIC LIFESTYLE CHANGES)  Discuss your risk for heart disease with your caregivers, and take steps to reduce risk factors.   Change your diet. Choose foods that are low in saturated fat, trans fat, and cholesterol.   Add exercise to your daily routine if it is not already being done. Participate in physical activity of moderate intensity, like brisk walking, for at least 30 minutes on most, and preferably all days of the week. No time? Break the 30 minutes into three, 10-minute segments during the day.   Stop smoking. If you do smoke, contact your caregiver to discuss ways in which they can help you quit.   Do not use street drugs.   Maintain a normal weight.   Maintain a healthy blood pressure.   Keep up with your blood work for checking the fats in your blood as directed by your caregiver.  Document Released: 10/18/2003 Document Re-Released: 06/19/2009 [facility] Patient Information 2011 [place], [place].Hepatitis Hepatitis is a swelling (inflammation) of the liver. Common symptoms of this condition include fever, tiredness, poor appetite,  nausea, vomiting, abdominal pain, dark urine and yellowing of the eyes and skin (jaundice). Hepatitis can be caused by several different viruses. Alcohol abuse, the use of certain drugs and toxic chemicals can also cause hepatitis. Blood tests are often needed to find out which type of hepatitis you have. Measures can be taken to properly treat you and to protect your family and close contacts. The most common types of hepatitis are: HEPATITIS A: This type is picked up from contaminated food, water and from close personal contact. Hepatitis A is usually a mild illness with full recovery in 2 to 4 weeks. Most people only need supportive treatment at home:  Rest.   Fluids.   A simple diet.   A gradual return to normal activity and lifestyle as able.   Do not drink alcohol until you are fully recovered.   Avoid contact with susceptible people for 1 week, or until your caregiver says it is safe.  Family members and close contacts can prevent hepatitis by getting gamma globulin shots within 2 weeks of exposure. There are 2 vaccines available to prevent Hepatitis A (VAQTA and HAVRIX). HEPATITIS B: This type was once called serum hepatitis. It is spread mainly by sexual contact, by sharing needles (blood transfer) and accidental exposure to an infected person's blood. Hepatitis B can be a very serious illness. Some patients develop a chronic infection or suffer permanent liver damage (cirrhosis). Treatment requires bed rest, a good diet and avoiding all drugs and alcohol. Sexual partners, and anyone exposed to an infected person's blood must get Hepatitis B immune globulin and vaccine to prevent the infection. HEPATITIS C: This is also caused by a virus and transmitted by sexual and blood contact, like Hepatitis B. There is no vaccine and there is no known prevention except avoidance of contact. Other viruses (D, E, F, G) have also been shown to cause hepatitis. Please see your caregiver for a follow-up  exam as advised. SEEK IMMEDIATE MEDICAL CARE IF:  You can not keep fluids down and become dehydrated.   You have increasing abdominal pain.   You become drowsy or confused.   You vomit blood or black material.   You pass  blood in the stool or the stool appears black.   You feel faint, lightheaded or you pass out.  Document Released: 02/07/2004 Document Re-Released: 03/26/2009 [facility] Patient Information 2011 [place], [place].Hypertension (High Blood Pressure) As your heart beats, it forces blood through your arteries. This force is your blood pressure. If the pressure is too high, it is called hypertension (HTN) or high blood pressure. HTN is dangerous because you may have it and not know it. High blood pressure may mean that your heart has to work harder to pump blood. Your arteries may be narrow or stiff. The extra work puts you at risk for heart disease, stroke, and other problems.  Blood pressure consists of two numbers, a higher number over a lower, 110/72, for example. It is stated as "110 over 72." The ideal is below 120 for the top number (systolic) and under 80 for the bottom (diastolic). Write down your blood pressure today. You should pay close attention to your blood pressure if you have certain conditions such as:  Heart failure.  Prior heart attack.   Diabetes   Chronic kidney disease.   Prior stroke.   Multiple risk factors for heart disease.   To see if you have HTN, your blood pressure should be measured while you are seated with your arm held at the level of the heart. It should be measured at least twice. A one-time elevated blood pressure reading (especially in the Emergency Department) does not mean that you need treatment. There may be conditions in which the blood pressure is different between your right and left arms. It is important to see your caregiver soon for a recheck. Most people have essential hypertension which means that there is not a specific cause.  This type of high blood pressure may be lowered by changing lifestyle factors such as:  Stress.  Smoking.   Lack of exercise.   Excessive weight.  Drug/tobacco/alcohol use.   Eating less salt.   Most people do not have symptoms from high blood pressure until it has caused damage to the body. Effective treatment can often prevent, delay or reduce that damage. TREATMENT Treatment for high blood pressure, when a cause has been identified, is directed at the cause. There are a large number of medications to treat HTN. These fall into several categories, and your caregiver will help you select the medicines that are best for you. Medications may have side effects. You should review side effects with your caregiver. If your blood pressure stays high after you have made lifestyle changes or started on medicines,   Your medication(s) may need to be changed.   Other problems may need to be addressed.   Be certain you understand your prescriptions, and know how and when to take your medicine.   Be sure to follow up with your caregiver within the time frame advised (usually within two weeks) to have your blood pressure rechecked and to review your medications.   If you are taking more than one medicine to lower your blood pressure, make sure you know how and at what times they should be taken. Taking two medicines at the same time can result in blood pressure that is too low.  SEEK IMMEDIATE MEDICAL CARE IF YOU DEVELOP:  A severe headache, blurred or changing vision, or confusion.   Unusual weakness or numbness, or a faint feeling.   Severe chest or abdominal pain, vomiting, or breathing problems.  MAKE SURE YOU:   Understand these instructions.   Will  watch your condition.   Will get help right away if you are not doing well or get worse.  Document Released: 12/30/2004 Document Re-Released: 06/19/2009 [facility] Patient Information 2011 [place], [place].

## 2010-08-23 NOTE — Telephone Encounter (Signed)
Ov today.

## 2010-08-23 NOTE — Assessment & Plan Note (Signed)
His BP is not well controlled so I will change/augment his meds

## 2010-08-23 NOTE — Assessment & Plan Note (Signed)
Unchanged

## 2010-08-23 NOTE — Telephone Encounter (Signed)
Pt has appointment today with Dr. Jones 

## 2010-08-23 NOTE — Progress Notes (Signed)
Subjective:    Patient ID: Larry Rangel, male    DOB: [birth year], 60 y.o.   MRN: [id number]  Hypertension This is a chronic problem. The current episode started more than 1 year ago. The problem has been gradually worsening since onset. The problem is uncontrolled. Associated symptoms include anxiety. Pertinent negatives include no blurred vision, chest pain, headaches, malaise/fatigue, neck pain, orthopnea, palpitations, peripheral edema, PND, shortness of breath or sweats. Past treatments include beta blockers and angiotensin blockers. The current treatment provides mild improvement. Compliance problems include exercise and diet.       Review of Systems  Constitutional: Negative for fever, chills, malaise/fatigue, diaphoresis, activity change, appetite change, fatigue and unexpected weight change.  HENT: Negative for sore throat, facial swelling, trouble swallowing, neck pain, neck stiffness and voice change.   Eyes: Negative for blurred vision, photophobia, redness and visual disturbance.  Respiratory: Negative for apnea, cough, choking, chest tightness, shortness of breath, wheezing and stridor.   Cardiovascular: Negative for chest pain, palpitations, orthopnea, leg swelling and PND.  Gastrointestinal: Negative for nausea, vomiting, abdominal pain, diarrhea, constipation, blood in stool and abdominal distention.  Genitourinary: Negative for dysuria, urgency, frequency, hematuria, flank pain, decreased urine volume, enuresis and difficulty urinating.  Musculoskeletal: Negative for myalgias, back pain, joint swelling, arthralgias and gait problem.  Skin: Negative for color change, pallor, rash and wound.  Neurological: Negative for headaches.  Psychiatric/Behavioral: Negative for suicidal ideas, hallucinations, behavioral problems, confusion, sleep disturbance, self-injury, dysphoric mood, decreased concentration and agitation. The patient is nervous/anxious. The patient is not  hyperactive.        Objective:   Physical Exam  Vitals reviewed. Constitutional: He is oriented to person, place, and time. He appears well-developed and well-nourished. No distress.  HENT:  Head: Normocephalic and atraumatic.  Right Ear: External ear normal.  Left Ear: External ear normal.  Nose: Nose normal.  Mouth/Throat: Oropharynx is clear and moist. No oropharyngeal exudate.  Eyes: Conjunctivae and EOM are normal. Pupils are equal, round, and reactive to light. Right eye exhibits no discharge. Left eye exhibits no discharge. No scleral icterus.  Neck: Normal range of motion. Neck supple. No JVD present. No tracheal deviation present. No thyromegaly present.  Cardiovascular: Normal rate, regular rhythm, normal heart sounds and intact distal pulses.  Exam reveals no gallop and no friction rub.   No murmur heard. Pulmonary/Chest: Effort normal and breath sounds normal. No stridor. No respiratory distress. He has no wheezes. He has no rales. He exhibits no tenderness.  Abdominal: Soft. Bowel sounds are normal. He exhibits no distension and no mass. There is no tenderness. There is no rebound and no guarding.  Musculoskeletal: Normal range of motion. He exhibits no edema and no tenderness.  Lymphadenopathy:    He has no cervical adenopathy.  Neurological: He is alert and oriented to person, place, and time. He has normal reflexes. He displays normal reflexes. No cranial nerve deficit. He exhibits normal muscle tone. Coordination normal.  Skin: Skin is warm and dry. No rash noted. He is not diaphoretic. No erythema. No pallor.  Psychiatric: He has a normal mood and affect. His behavior is normal. Judgment and thought content normal.      Lab Results  Component Value Date   WBC 8.6 08/09/2010   HGB 17.3* 08/09/2010   HCT 50.4 08/09/2010   PLT 151.0 08/09/2010   CHOL 124 08/09/2010   TRIG 376.0* 08/09/2010   HDL 24.80* 08/09/2010   LDLDIRECT 55.3 08/09/2010   ALT 145* 08/09/2010  AST  126* 08/09/2010   NA 138 08/09/2010   K 3.4* 08/09/2010   CL 100 08/09/2010   CREATININE 0.8 08/09/2010   BUN 10 08/09/2010   CO2 27 08/09/2010   TSH 0.69 08/09/2010   PSA 0.85 08/09/2010   INR 1.1 RATIO* 12/21/2006      Assessment & Plan:

## 2010-08-23 NOTE — Assessment & Plan Note (Signed)
He needs a higher dose of niaspan

## 2010-08-25 LAB — HEPATITIS PANEL, ACUTE
HCV Ab: NEGATIVE
Hep A IgM: NEGATIVE
Hep B C IgM: NEGATIVE
Hepatitis B Surface Ag: NEGATIVE

## 2010-09-05 ENCOUNTER — Encounter: Payer: Self-pay | Admitting: Cardiology

## 2010-09-24 ENCOUNTER — Ambulatory Visit (INDEPENDENT_AMBULATORY_CARE_PROVIDER_SITE_OTHER): Payer: BC Managed Care – PPO | Admitting: Cardiology

## 2010-09-24 ENCOUNTER — Telehealth: Payer: Self-pay | Admitting: *Deleted

## 2010-09-24 ENCOUNTER — Encounter: Payer: Self-pay | Admitting: Cardiology

## 2010-09-24 ENCOUNTER — Other Ambulatory Visit: Payer: Self-pay | Admitting: *Deleted

## 2010-09-24 VITALS — BP 152/80 | HR 60 | Ht 73.0 in | Wt 236.0 lb

## 2010-09-24 DIAGNOSIS — F17200 Nicotine dependence, unspecified, uncomplicated: Secondary | ICD-10-CM

## 2010-09-24 DIAGNOSIS — I25810 Atherosclerosis of coronary artery bypass graft(s) without angina pectoris: Secondary | ICD-10-CM

## 2010-09-24 DIAGNOSIS — I10 Essential (primary) hypertension: Secondary | ICD-10-CM

## 2010-09-24 MED ORDER — AMLODIPINE BESYLATE-VALSARTAN 5-320 MG PO TABS: 1.0000 | ORAL_TABLET | Freq: Every day | ORAL | Status: DC

## 2010-09-24 MED ORDER — NEBIVOLOL HCL 20 MG PO TABS: 1.0000 | ORAL_TABLET | Freq: Every day | ORAL | Status: DC

## 2010-09-24 NOTE — Assessment & Plan Note (Signed)
Advised to check at rest and without smoking for one hour. Goal is less than 140/90.

## 2010-09-24 NOTE — Assessment & Plan Note (Signed)
Advised to stop. He has cut down quite a bit.

## 2010-09-24 NOTE — Progress Notes (Signed)
HPI Larry Rangel returns for followup evaluation and management of his coronary disease. He is 3 years post bypass. He says he feels great with no angina, shortness of breath, palpitations. His blood pressure was elevated recently and in Dr. Yetta Barre change his meds. Pressure is coming down. He still smokes about 10 cigarettes a day. Advised to quit.  EKG shows normal sinus rhythm with left atrial large left ventricular hypertrophy with strain pattern, unchanged Past Medical History  Diagnosis Date  . Anxiety   . Hypertension   . Low back pain   . Tobacco user   . CAD (coronary artery disease) of artery bypass graft   . Hyperlipidemia   . S/P colonoscopy     Past Surgical History  Procedure Date  . Shoulder surgery   . Coronary artery bypass graft 2009    x 5    Family History  Problem Relation Age of Onset  . Coronary artery disease Mother   . Heart failure Father 33  . Cancer Neg Hx     History   Social History  . Marital Status: Married    Spouse Name: N/A    Number of Children: N/A  . Years of Education: N/A   Occupational History  . Not on file.   Social History Main Topics  . Smoking status: Current Everyday Smoker -- 1.0 packs/day for 40 years    Types: Cigarettes  . Smokeless tobacco: Not on file  . Alcohol Use: 12.6 oz/week    21 Glasses of wine per week     drinks 2 bottles of wine per week  . Drug Use: No  . Sexually Active: Yes   Other Topics Concern  . 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    No Known Allergies  Current Outpatient Prescriptions  Medication Sig Dispense Refill  . ALPRAZolam (XANAX) 1 MG tablet Take 1 tablet (1 mg total) by mouth at bedtime as needed.  30 tablet  5  . amLODipine-valsartan (EXFORGE) 5-320 MG per tablet Take 1 tablet by mouth daily.  112 tablet  0  . aspirin 325 MG EC tablet Take 325 mg by mouth daily.        . busPIRone (BUSPAR) 10 MG tablet Take 1 tablet (10 mg total) by mouth 2 (two) times daily.  180 tablet  1  . clonazePAM (KLONOPIN) 0.5 MG tablet Take 1 tablet (0.5 mg total) by mouth 2 (two) times daily as needed.  60 tablet  5  . cyclobenzaprine (FLEXERIL) 10 MG tablet Take 10 mg by mouth 3 (three) times daily as needed.        . Nebivolol HCl (BYSTOLIC) 20 MG TABS Take 1 tablet (20 mg total) by mouth daily.  56 tablet  0  . simvastatin (ZOCOR) 20 MG tablet Take 10 mg by mouth at bedtime. Take 1/2 tab at bedtime        ROS Negative other than HPI.   PE General Appearance: well developed, well nourished  in no acute distress HEENT: symmetrical face, PERRLA, good dentition  Neck: no JVD, thyromegaly, or adenopathy, trachea midline Chest: symmetric without deformity Cardiac: PMI non-displaced, RRR, normal S1, S2, no gallop or murmur Lung: clear to ausculation and percussion Vascular: all pulses full without bruits  Abdominal: nondistended, nontender, good bowel sounds, no HSM, no bruits Extremities: no cyanosis, clubbing or edema, no sign of DVT, no varicosities  Skin: normal color, no rashes Neuro: alert and oriented x 3, non-focal Pysch: normal affect Filed Vitals:   09/24/10 1138  BP: 152/80  Pulse: 60  Height: 6\' 1"  (1.854 m)  Weight: 236 lb (107.049 kg)    EKG  Labs and Studies Reviewed.   Lab Results  Component Value Date   WBC 8.6 08/09/2010   HGB 17.3* 08/09/2010   HCT 50.4 08/09/2010   MCV 91.6 08/09/2010   PLT 151.0 08/09/2010      Chemistry      Component Value Date/Time   NA 141 08/23/2010 1551   K  3.5 08/23/2010 1551   CL 105 08/23/2010 1551   CO2 28 08/23/2010 1551   BUN 9 08/23/2010 1551   CREATININE 0.9 08/23/2010 1551      Component Value Date/Time   CALCIUM 9.1 08/23/2010 1551   ALKPHOS 118* 08/23/2010 1551   AST 133* 08/23/2010 1551   ALT 158* 08/23/2010 1551   BILITOT 0.9 08/23/2010 1551       Lab Results  Component Value Date   CHOL 124 08/09/2010   CHOL 112 03/25/2007   Lab Results  Component Value Date   HDL 24.80* 08/09/2010   HDL 30.3* 03/25/2007   No results found for this basename: LDLCALC   Lab Results  Component Value Date   TRIG 376.0* 08/09/2010   TRIG 226* 03/25/2007   Lab Results  Component Value Date   CHOLHDL 5 08/09/2010   CHOLHDL 3.7 CALC 03/25/2007   No results found for this basename: HGBA1C   Lab Results  Component Value Date   ALT 158* 08/23/2010   AST 133* 08/23/2010   ALKPHOS 118* 08/23/2010   BILITOT 0.9 08/23/2010   Lab Results  Component Value Date   TSH 0.69 08/09/2010

## 2010-09-24 NOTE — Telephone Encounter (Signed)
Samples ready, pt aware 

## 2010-09-24 NOTE — Assessment & Plan Note (Signed)
Stable   No change in meds

## 2010-09-24 NOTE — Patient Instructions (Addendum)
Your physician has requested that you regularly monitor and record your blood pressure readings at home. Please use the same machine at the same time of day to check your readings and record them to bring to your follow-up visit. Goal 130/80  Please check your blood pressure at least 1 hour after you have taken your medications  Your physician recommends that you schedule a follow-up appointment in: 2 years with Dr. Daleen Squibb

## 2010-10-02 LAB — CBC
HCT: 30.1 — ABNORMAL LOW
HCT: 31.7 — ABNORMAL LOW
HCT: 45.7
Hemoglobin: 10.4 — ABNORMAL LOW
Hemoglobin: 16.2
MCHC: 34.7
MCHC: 35.1
MCHC: 35.4
MCV: 87.1
MCV: 87.3
MCV: 87.6
MCV: 88
Platelets: 118 — ABNORMAL LOW
Platelets: 134 — ABNORMAL LOW
RBC: 3.19 — ABNORMAL LOW
RBC: 5.24
RDW: 13.7
WBC: 10.4
WBC: 11.1 — ABNORMAL HIGH
WBC: 12 — ABNORMAL HIGH

## 2010-10-02 LAB — POCT I-STAT 3, ART BLOOD GAS (G3+)
Acid-Base Excess: 1
Acid-base deficit: 1
Acid-base deficit: 1
Bicarbonate: 24.9 — ABNORMAL HIGH
Bicarbonate: 25 — ABNORMAL HIGH
Bicarbonate: 25.8 — ABNORMAL HIGH
O2 Saturation: 96
Operator id: 209041
Operator id: 241461
Operator id: 3402
Patient temperature: 36.7
Patient temperature: 96.7
TCO2: 26
TCO2: 26
pCO2 arterial: 43.9
pCO2 arterial: 48.3 — ABNORMAL HIGH
pH, Arterial: 7.325 — ABNORMAL LOW
pH, Arterial: 7.335 — ABNORMAL LOW
pH, Arterial: 7.363
pO2, Arterial: 119 — ABNORMAL HIGH
pO2, Arterial: 158 — ABNORMAL HIGH
pO2, Arterial: 59 — ABNORMAL LOW
pO2, Arterial: 85

## 2010-10-02 LAB — DIFFERENTIAL
Eosinophils Relative: 3
Lymphocytes Relative: 27
Lymphs Abs: 2.8
Monocytes Absolute: 0.6
Neutro Abs: 6.6

## 2010-10-02 LAB — BLOOD GAS, ARTERIAL
Acid-Base Excess: 0.8
Bicarbonate: 24.5 — ABNORMAL HIGH
TCO2: 25.6
pCO2 arterial: 36.5
pH, Arterial: 7.441
pO2, Arterial: 80.5

## 2010-10-02 LAB — PROTIME-INR
INR: 1
Prothrombin Time: 12.9
Prothrombin Time: 18.3 — ABNORMAL HIGH

## 2010-10-02 LAB — POCT I-STAT 4, (NA,K, GLUC, HGB,HCT)
Glucose, Bld: 107 — ABNORMAL HIGH
Glucose, Bld: 108 — ABNORMAL HIGH
Glucose, Bld: 111 — ABNORMAL HIGH
Glucose, Bld: 131 — ABNORMAL HIGH
HCT: 29 — ABNORMAL LOW
HCT: 31 — ABNORMAL LOW
HCT: 36 — ABNORMAL LOW
HCT: 42
Hemoglobin: 10.5 — ABNORMAL LOW
Hemoglobin: 14.3
Hemoglobin: 9.9 — ABNORMAL LOW
Operator id: 241461
Operator id: 3402
Operator id: 3402
Potassium: 2.7 — CL
Potassium: 3.1 — ABNORMAL LOW
Potassium: 3.2 — ABNORMAL LOW
Potassium: 4.9
Sodium: 142

## 2010-10-02 LAB — HEMOGLOBIN AND HEMATOCRIT, BLOOD
HCT: 31.8 — ABNORMAL LOW
Hemoglobin: 10.9 — ABNORMAL LOW

## 2010-10-02 LAB — I-STAT EC8
BUN: 9
Chloride: 107
Glucose, Bld: 95
Potassium: 3.6
Sodium: 142
TCO2: 26

## 2010-10-02 LAB — COMPREHENSIVE METABOLIC PANEL
ALT: 44
Albumin: 3.9
Alkaline Phosphatase: 75
CO2: 23
Chloride: 107
Creatinine, Ser: 0.8
GFR calc Af Amer: >60
Glucose, Bld: 108 — ABNORMAL HIGH
Total Protein: 6.7

## 2010-10-02 LAB — MAGNESIUM: Magnesium: 2.4

## 2010-10-02 LAB — URINALYSIS, ROUTINE W REFLEX MICROSCOPIC
Bilirubin Urine: NEGATIVE
Glucose, UA: NEGATIVE
Hgb urine dipstick: NEGATIVE
Ketones, ur: NEGATIVE
Nitrite: NEGATIVE
Specific Gravity, Urine: 1.02
pH: 7

## 2010-10-02 LAB — BASIC METABOLIC PANEL
Calcium: 7.6 — ABNORMAL LOW
Chloride: 109
Creatinine, Ser: 0.81
GFR calc Af Amer: >60
Sodium: 138

## 2010-10-02 LAB — TYPE AND SCREEN

## 2010-10-02 LAB — APTT: aPTT: 28

## 2010-10-02 LAB — HEMOGLOBIN A1C: Hgb A1c MFr Bld: 5.6

## 2010-10-03 LAB — BASIC METABOLIC PANEL
BUN: 9
CO2: 27
Chloride: 102
Chloride: 102
Creatinine, Ser: 0.82
GFR calc Af Amer: >60
Glucose, Bld: 95
Potassium: 3.2 — ABNORMAL LOW

## 2010-10-03 LAB — CBC
HCT: 26.4 — ABNORMAL LOW
MCV: 88.2
RBC: 2.99 — ABNORMAL LOW
WBC: 11.3 — ABNORMAL HIGH

## 2010-10-24 ENCOUNTER — Telehealth: Payer: Self-pay | Admitting: *Deleted

## 2010-10-24 NOTE — Telephone Encounter (Signed)
Patient requesting RFs ([facility]) and SAMPLES of Bystolic and exforge.

## 2010-10-25 MED ORDER — NEBIVOLOL HCL 20 MG PO TABS: 1.0000 | ORAL_TABLET | Freq: Every day | ORAL | Status: DC

## 2010-10-25 MED ORDER — AMLODIPINE BESYLATE-VALSARTAN 5-320 MG PO TABS: 1.0000 | ORAL_TABLET | Freq: Every day | ORAL | Status: DC

## 2010-11-05 ENCOUNTER — Other Ambulatory Visit: Payer: Self-pay | Admitting: *Deleted

## 2010-11-05 MED ORDER — AMLODIPINE BESYLATE-VALSARTAN 5-320 MG PO TABS: 1.0000 | ORAL_TABLET | Freq: Every day | ORAL | Status: DC

## 2010-11-05 MED ORDER — NEBIVOLOL HCL 20 MG PO TABS: 1.0000 | ORAL_TABLET | Freq: Every day | ORAL | Status: DC

## 2010-11-18 ENCOUNTER — Telehealth: Payer: Self-pay | Admitting: Cardiology

## 2010-11-18 NOTE — Telephone Encounter (Signed)
They will refax carotid artery questionnaire. Mylo Red RN

## 2010-11-18 NOTE — Telephone Encounter (Signed)
Faxed life insurance form last week, checking to see if received and status

## 2010-11-27 ENCOUNTER — Other Ambulatory Visit: Payer: Self-pay | Admitting: Internal Medicine

## 2011-01-28 ENCOUNTER — Other Ambulatory Visit: Payer: Self-pay | Admitting: Internal Medicine

## 2011-02-21 ENCOUNTER — Other Ambulatory Visit: Payer: Self-pay | Admitting: Cardiology

## 2011-02-21 ENCOUNTER — Other Ambulatory Visit: Payer: Self-pay | Admitting: Internal Medicine

## 2011-02-24 ENCOUNTER — Other Ambulatory Visit: Payer: Self-pay | Admitting: *Deleted

## 2011-02-24 MED ORDER — SIMVASTATIN 20 MG PO TABS: 20.0000 mg | ORAL_TABLET | Freq: Every day | ORAL | Status: DC

## 2011-03-06 ENCOUNTER — Telehealth: Payer: Self-pay

## 2011-03-06 NOTE — Telephone Encounter (Signed)
No, he needs to be seen.

## 2011-03-06 NOTE — Telephone Encounter (Signed)
Please advise if ok to refill clonazepam 0.5mg  BID PRN, last filled 08/09/10 #60/5rf. Patient last seen 08/23/10. Thanks

## 2011-03-06 NOTE — Telephone Encounter (Signed)
Pharmacy notified via fax.

## 2011-03-13 ENCOUNTER — Other Ambulatory Visit: Payer: Self-pay

## 2011-03-13 MED ORDER — NEBIVOLOL HCL 20 MG PO TABS: 1.0000 | ORAL_TABLET | Freq: Every day | ORAL | Status: DC

## 2011-03-17 ENCOUNTER — Encounter: Payer: Self-pay | Admitting: Internal Medicine

## 2011-03-17 ENCOUNTER — Ambulatory Visit (INDEPENDENT_AMBULATORY_CARE_PROVIDER_SITE_OTHER): Payer: BC Managed Care – PPO | Admitting: Internal Medicine

## 2011-03-17 ENCOUNTER — Ambulatory Visit: Payer: BC Managed Care – PPO | Admitting: Internal Medicine

## 2011-03-17 ENCOUNTER — Other Ambulatory Visit (INDEPENDENT_AMBULATORY_CARE_PROVIDER_SITE_OTHER): Payer: BC Managed Care – PPO

## 2011-03-17 DIAGNOSIS — E785 Hyperlipidemia, unspecified: Secondary | ICD-10-CM

## 2011-03-17 DIAGNOSIS — R7309 Other abnormal glucose: Secondary | ICD-10-CM

## 2011-03-17 DIAGNOSIS — I10 Essential (primary) hypertension: Secondary | ICD-10-CM

## 2011-03-17 DIAGNOSIS — F411 Generalized anxiety disorder: Secondary | ICD-10-CM

## 2011-03-17 DIAGNOSIS — E1169 Type 2 diabetes mellitus with other specified complication: Secondary | ICD-10-CM | POA: Insufficient documentation

## 2011-03-17 DIAGNOSIS — F17200 Nicotine dependence, unspecified, uncomplicated: Secondary | ICD-10-CM

## 2011-03-17 DIAGNOSIS — M545 Low back pain: Secondary | ICD-10-CM

## 2011-03-17 DIAGNOSIS — E669 Obesity, unspecified: Secondary | ICD-10-CM | POA: Insufficient documentation

## 2011-03-17 LAB — COMPREHENSIVE METABOLIC PANEL
Albumin: 4 g/dL (ref 3.5–5.2)
Alkaline Phosphatase: 126 U/L — ABNORMAL HIGH (ref 39–117)
BUN: 14 mg/dL (ref 6–23)
Glucose, Bld: 290 mg/dL — ABNORMAL HIGH (ref 70–99)
Total Bilirubin: 0.6 mg/dL (ref 0.3–1.2)

## 2011-03-17 LAB — LIPID PANEL
Cholesterol: 149 mg/dL (ref 0–200)
HDL: 31.2 mg/dL — ABNORMAL LOW (ref >39.00)
Total CHOL/HDL Ratio: 5
VLDL: 130 mg/dL — ABNORMAL HIGH (ref 0.0–40.0)

## 2011-03-17 LAB — URINALYSIS, ROUTINE W REFLEX MICROSCOPIC
Bilirubin Urine: NEGATIVE
Hgb urine dipstick: NEGATIVE
Ketones, ur: NEGATIVE
Urine Glucose: >=1000
Urobilinogen, UA: 1 (ref 0.0–1.0)

## 2011-03-17 LAB — CBC WITH DIFFERENTIAL/PLATELET
Basophils Relative: 0.3 % (ref 0.0–3.0)
Eosinophils Relative: 2.3 % (ref 0.0–5.0)
HCT: 50.5 % (ref 39.0–52.0)
MCV: 90.1 fl (ref 78.0–100.0)
Monocytes Absolute: 0.6 10*3/uL (ref 0.1–1.0)
Neutrophils Relative %: 63 % (ref 43.0–77.0)
RBC: 5.6 Mil/uL (ref 4.22–5.81)
WBC: 9.6 10*3/uL (ref 4.5–10.5)

## 2011-03-17 MED ORDER — CYCLOBENZAPRINE HCL 10 MG PO TABS: 10.0000 mg | ORAL_TABLET | Freq: Three times a day (TID) | ORAL | Status: DC | PRN

## 2011-03-17 MED ORDER — CLONAZEPAM 0.5 MG PO TABS: 0.5000 mg | ORAL_TABLET | Freq: Two times a day (BID) | ORAL | Status: DC | PRN

## 2011-03-17 NOTE — Assessment & Plan Note (Signed)
Continue current meds 

## 2011-03-17 NOTE — Assessment & Plan Note (Signed)
I will check his FLP today 

## 2011-03-17 NOTE — Assessment & Plan Note (Signed)
I will recheck his LFT's today 

## 2011-03-17 NOTE — Assessment & Plan Note (Signed)
For an a1c today 

## 2011-03-17 NOTE — Patient Instructions (Signed)

## 2011-03-17 NOTE — Progress Notes (Signed)
Subjective:    Patient ID: Larry Rangel, male    DOB: Aug 15, 1950, 61 y.o.   MRN: [id number]  Hypertension This is a chronic problem. The current episode started more than 1 year ago. The problem has been gradually improving since onset. The problem is controlled. Associated symptoms include anxiety. Pertinent negatives include no blurred vision, chest pain, headaches, malaise/fatigue, neck pain, orthopnea, palpitations, peripheral edema, PND, shortness of breath or sweats. There are no associated agents to hypertension. Past treatments include beta blockers, calcium channel blockers and angiotensin blockers. The current treatment provides significant improvement. Compliance problems include exercise and diet.  There is no history of chronic renal disease.  Hyperlipidemia This is a chronic problem. The current episode started more than 1 year ago. The problem is resistant. Recent lipid tests were reviewed and are variable. Exacerbating diseases include obesity. He has no history of chronic renal disease, diabetes, hypothyroidism, liver disease or nephrotic syndrome. Factors aggravating his hyperlipidemia include fatty foods and beta blockers. Pertinent negatives include no chest pain, focal sensory loss, focal weakness, leg pain, myalgias or shortness of breath. Current antihyperlipidemic treatment includes statins. The current treatment provides moderate improvement of lipids. Compliance problems include adherence to exercise and adherence to diet.       Review of Systems  Constitutional: Negative for fever, chills, malaise/fatigue, diaphoresis, activity change, appetite change, fatigue and unexpected weight change.  HENT: Negative.  Negative for neck pain.   Eyes: Negative.  Negative for blurred vision.  Respiratory: Negative for cough, choking, chest tightness, shortness of breath, wheezing and stridor.   Cardiovascular: Negative for chest pain, palpitations, orthopnea, leg swelling and PND.    Gastrointestinal: Negative for nausea, vomiting, abdominal pain, diarrhea, constipation and anal bleeding.  Genitourinary: Negative for dysuria, urgency, frequency, hematuria, flank pain, decreased urine volume, enuresis and difficulty urinating.  Musculoskeletal: Negative for myalgias, back pain, joint swelling, arthralgias and gait problem.  Skin: Negative for color change, pallor, rash and wound.  Neurological: Negative for dizziness, tremors, focal weakness, seizures, syncope, facial asymmetry, speech difficulty, weakness, light-headedness, numbness and headaches.  Hematological: Negative for adenopathy. Does not bruise/bleed easily.  Psychiatric/Behavioral: Positive for sleep disturbance. Negative for suicidal ideas, hallucinations, behavioral problems, confusion, self-injury, dysphoric mood, decreased concentration and agitation. The patient is nervous/anxious. The patient is not hyperactive.        Objective:   Physical Exam  Vitals reviewed. Constitutional: He is oriented to person, place, and time. He appears well-developed and well-nourished. No distress.  HENT:  Head: Normocephalic and atraumatic.  Mouth/Throat: Oropharynx is clear and moist. No oropharyngeal exudate.  Eyes: Conjunctivae are normal. Right eye exhibits no discharge. Left eye exhibits no discharge. No scleral icterus.  Neck: Normal range of motion. Neck supple. No JVD present. No tracheal deviation present. No thyromegaly present.  Cardiovascular: Normal rate, regular rhythm, normal heart sounds and intact distal pulses.  Exam reveals no gallop and no friction rub.   No murmur heard. Pulmonary/Chest: Effort normal and breath sounds normal. No stridor. No respiratory distress. He has no wheezes. He has no rales. He exhibits no tenderness.  Abdominal: Soft. Bowel sounds are normal. He exhibits no distension and no mass. There is no tenderness. There is no rebound and no guarding.  Musculoskeletal: Normal range of  motion. He exhibits no edema and no tenderness.  Lymphadenopathy:    He has no cervical adenopathy.  Neurological: He is oriented to person, place, and time.  Skin: Skin is warm and dry. No rash noted. He  is not diaphoretic. No erythema. No pallor.  Psychiatric: He has a normal mood and affect. His behavior is normal. Judgment and thought content normal.      Lab Results  Component Value Date   WBC 8.6 08/09/2010   HGB 17.3* 08/09/2010   HCT 50.4 08/09/2010   PLT 151.0 08/09/2010   GLUCOSE 180* 08/23/2010   CHOL 124 08/09/2010   TRIG 376.0* 08/09/2010   HDL 24.80* 08/09/2010   LDLDIRECT 55.3 08/09/2010   ALT 158* 08/23/2010   AST 133* 08/23/2010   NA 141 08/23/2010   K 3.5 08/23/2010   CL 105 08/23/2010   CREATININE 0.9 08/23/2010   BUN 9 08/23/2010   CO2 28 08/23/2010   TSH 0.69 08/09/2010   PSA 0.85 08/09/2010   INR 1.5 01/26/2007   HGBA1C  Value: 5.6 (NOTE)   The ADA recommends the following therapeutic goals for glycemic   control related to Hgb A1C measurement:   Goal of Therapy:   < 7.0% Hgb A1C   Action Suggested:  > 8.0% Hgb A1C   Ref:  Diabetes Care, 22, Suppl. 1, 1999 01/22/2007      Assessment & Plan:

## 2011-03-17 NOTE — Assessment & Plan Note (Signed)
He was praised for success with his smoking cessation program

## 2011-03-17 NOTE — Assessment & Plan Note (Signed)
His BP is well controlled, I will check his lytes and renal function today 

## 2011-03-18 ENCOUNTER — Encounter: Payer: Self-pay | Admitting: Internal Medicine

## 2011-03-18 MED ORDER — FENOFIBRATE 150 MG PO CAPS: 1.0000 | ORAL_CAPSULE | Freq: Every day | ORAL | Status: DC

## 2011-03-18 MED ORDER — LINAGLIPTIN-METFORMIN HCL 2.5-1000 MG PO TABS: 1.0000 | ORAL_TABLET | Freq: Two times a day (BID) | ORAL | Status: DC

## 2011-03-18 NOTE — Progress Notes (Signed)
Addended by: Etta Grandchild on: 03/18/2011 07:45 AM   Modules accepted: Orders

## 2011-03-24 ENCOUNTER — Telehealth: Payer: Self-pay

## 2011-03-24 NOTE — Telephone Encounter (Signed)
PA approved.

## 2011-04-14 ENCOUNTER — Other Ambulatory Visit: Payer: Self-pay | Admitting: Internal Medicine

## 2011-05-26 ENCOUNTER — Other Ambulatory Visit: Payer: Self-pay | Admitting: Cardiology

## 2011-07-02 ENCOUNTER — Other Ambulatory Visit: Payer: Self-pay | Admitting: Internal Medicine

## 2011-07-12 ENCOUNTER — Other Ambulatory Visit: Payer: Self-pay | Admitting: Internal Medicine

## 2011-07-21 ENCOUNTER — Other Ambulatory Visit: Payer: Self-pay

## 2011-07-21 MED ORDER — LINAGLIPTIN-METFORMIN HCL 2.5-1000 MG PO TABS: 1.0000 | ORAL_TABLET | Freq: Two times a day (BID) | ORAL | Status: DC

## 2011-08-04 ENCOUNTER — Telehealth: Payer: Self-pay

## 2011-08-04 ENCOUNTER — Other Ambulatory Visit: Payer: Self-pay

## 2011-08-04 MED ORDER — AMLODIPINE BESYLATE-VALSARTAN 5-320 MG PO TABS: 1.0000 | ORAL_TABLET | Freq: Every day | ORAL | Status: DC

## 2011-08-04 NOTE — Telephone Encounter (Signed)
Spouse called requesting a cheaper and generic alternative to Exforge, please advise.

## 2011-08-05 NOTE — Telephone Encounter (Signed)
Gave wife appt/phone  08/08/11 @ 9a w/dr tlj

## 2011-08-05 NOTE — Telephone Encounter (Signed)
He needs to be seen

## 2011-08-08 ENCOUNTER — Ambulatory Visit: Payer: BC Managed Care – PPO | Admitting: Internal Medicine

## 2011-09-11 ENCOUNTER — Other Ambulatory Visit: Payer: Self-pay | Admitting: *Deleted

## 2011-09-11 ENCOUNTER — Other Ambulatory Visit: Payer: Self-pay | Admitting: Internal Medicine

## 2011-09-11 DIAGNOSIS — F411 Generalized anxiety disorder: Secondary | ICD-10-CM

## 2011-09-11 MED ORDER — CLONAZEPAM 0.5 MG PO TABS: 0.5000 mg | ORAL_TABLET | Freq: Two times a day (BID) | ORAL | Status: DC | PRN

## 2011-09-11 NOTE — Telephone Encounter (Signed)
Received call from [facility] @ [facility] [address]. Pt is requesting refill on his clonazepam. MD is out of office. Is this ok to refill?...Raechel Chute

## 2011-09-12 NOTE — Telephone Encounter (Signed)
Rx faxed to pharmacy  

## 2011-10-04 ENCOUNTER — Other Ambulatory Visit: Payer: Self-pay | Admitting: Internal Medicine

## 2011-10-08 ENCOUNTER — Encounter: Payer: Self-pay | Admitting: Gastroenterology

## 2011-10-14 ENCOUNTER — Other Ambulatory Visit: Payer: Self-pay | Admitting: Internal Medicine

## 2011-10-17 ENCOUNTER — Telehealth: Payer: Self-pay | Admitting: Internal Medicine

## 2011-10-17 MED ORDER — NEBIVOLOL HCL 20 MG PO TABS: 1.0000 | ORAL_TABLET | Freq: Every day | ORAL | Status: DC

## 2011-10-17 NOTE — Telephone Encounter (Signed)
Caller: Maria/Spouse; Patient Name: Larry Rangel; PCP: Sanda Linger (Adults only); Best Callback Phone Number: [phone] Wife states office refilled script for Bystolic 20mg  1 qd #30 but insurance requires to get a 90 day supply. Needs this corrected at pharmacy as patient is out of this medicine 10-4. Uses [facility] [phone].PLEASE CALL AND ADVISE PATIENT THAT MEDICINE CAN BE PRESCRIBED FOR 90 DAY SUPPLY.

## 2011-10-20 ENCOUNTER — Other Ambulatory Visit: Payer: Self-pay

## 2011-10-20 MED ORDER — NEBIVOLOL HCL 20 MG PO TABS: 1.0000 | ORAL_TABLET | Freq: Every day | ORAL | Status: DC

## 2011-12-19 ENCOUNTER — Other Ambulatory Visit: Payer: Self-pay | Admitting: Internal Medicine

## 2012-02-12 ENCOUNTER — Other Ambulatory Visit: Payer: Self-pay | Admitting: Internal Medicine

## 2012-02-13 ENCOUNTER — Telehealth: Payer: Self-pay | Admitting: Internal Medicine

## 2012-02-13 MED ORDER — BUSPIRONE HCL 10 MG PO TABS: ORAL_TABLET | ORAL | Status: DC

## 2012-02-13 NOTE — Telephone Encounter (Signed)
Rx sent to [facility], pt's spouse informed and for pt to F/U for appointment with PCP.

## 2012-02-13 NOTE — Telephone Encounter (Signed)
Patient Information:  Caller Name: Larry Rangel  Phone: [phone]  Patient: Larry Rangel, Larry Rangel  Gender: Male  DOB: 01-01-1951  Age: 62 Years  PCP: Sanda Linger (Adults only)  Office Follow Up:  Does the office need to follow up with this patient?: Yes  Instructions For The Office: OFFICE PLEASE REVIEW FOR POSSIBLE BUSPAR REFILL AND APPT  RN Note:  [facility] [address] [phone]; instructed caller that we will review for possible appt and refill and call back today with further information; will comply  Symptoms  Reason For Call & Symptoms: Wife is calling and states that pt needs a refill on Buspar 10mg  BID; Pharmacy called and stated that he needed an office appt to get the refill; wife is wanting to know if office will give him a month supply to hold him over prior to scheduling an appt; states that it is hard for him to come in due to his work schedule; offered to try and make an appt for him but states that she can not make it for him; wanted to know if he can be seen in the Saturday clinic this week or on 02/19/12 at 0800; denies any sx at this time; pt is completely out of Buspar; he has been out of this medication for 2 days now  Reviewed Health History In EMR: Yes  Reviewed Medications In EMR: Yes  Reviewed Allergies In EMR: Yes  Reviewed Surgeries / Procedures: Yes  Date of Onset of Symptoms: Unknown  Guideline(s) Used:  No Protocol Available - Information Only  Disposition Per Guideline:   Discuss with PCP and Callback by Nurse Today  Reason For Disposition Reached:   Nursing judgment  Advice Given:  Call Back If:  New symptoms develop  You become worse.

## 2012-02-16 ENCOUNTER — Other Ambulatory Visit: Payer: Self-pay

## 2012-02-16 MED ORDER — BUSPIRONE HCL 10 MG PO TABS: ORAL_TABLET | ORAL | Status: DC

## 2012-02-16 NOTE — Telephone Encounter (Signed)
Received fax stating that insurance company requires 90day rx

## 2012-04-16 ENCOUNTER — Ambulatory Visit (INDEPENDENT_AMBULATORY_CARE_PROVIDER_SITE_OTHER): Payer: BC Managed Care – PPO | Admitting: Internal Medicine

## 2012-04-16 ENCOUNTER — Other Ambulatory Visit (INDEPENDENT_AMBULATORY_CARE_PROVIDER_SITE_OTHER): Payer: BC Managed Care – PPO

## 2012-04-16 ENCOUNTER — Encounter: Payer: Self-pay | Admitting: Internal Medicine

## 2012-04-16 VITALS — BP 132/86 | HR 80 | Temp 97.1°F | Resp 16 | Wt 227.0 lb

## 2012-04-16 DIAGNOSIS — I10 Essential (primary) hypertension: Secondary | ICD-10-CM

## 2012-04-16 DIAGNOSIS — I25810 Atherosclerosis of coronary artery bypass graft(s) without angina pectoris: Secondary | ICD-10-CM

## 2012-04-16 DIAGNOSIS — Z0000 Encounter for general adult medical examination without abnormal findings: Secondary | ICD-10-CM

## 2012-04-16 DIAGNOSIS — K921 Melena: Secondary | ICD-10-CM

## 2012-04-16 DIAGNOSIS — E785 Hyperlipidemia, unspecified: Secondary | ICD-10-CM

## 2012-04-16 DIAGNOSIS — IMO0001 Reserved for inherently not codable concepts without codable children: Secondary | ICD-10-CM

## 2012-04-16 DIAGNOSIS — R7402 Elevation of levels of lactic acid dehydrogenase (LDH): Secondary | ICD-10-CM

## 2012-04-16 LAB — CBC WITH DIFFERENTIAL/PLATELET
Basophils Absolute: 0.1 10*3/uL (ref 0.0–0.1)
Eosinophils Absolute: 0.3 10*3/uL (ref 0.0–0.7)
HCT: 49.4 % (ref 39.0–52.0)
Hemoglobin: 16.6 g/dL (ref 13.0–17.0)
Lymphs Abs: 3.1 10*3/uL (ref 0.7–4.0)
MCHC: 33.5 g/dL (ref 30.0–36.0)
Monocytes Absolute: 0.6 10*3/uL (ref 0.1–1.0)
Neutro Abs: 6.3 10*3/uL (ref 1.4–7.7)
Platelets: 184 10*3/uL (ref 150.0–400.0)
RDW: 13.2 % (ref 11.5–14.6)

## 2012-04-16 LAB — COMPREHENSIVE METABOLIC PANEL
ALT: 65 U/L — ABNORMAL HIGH (ref 0–53)
AST: 65 U/L — ABNORMAL HIGH (ref 0–37)
Alkaline Phosphatase: 66 U/L (ref 39–117)
Creatinine, Ser: 1 mg/dL (ref 0.4–1.5)
GFR: 76.43 mL/min (ref >60.00)
Sodium: 139 mEq/L (ref 135–145)
Total Bilirubin: 1.4 mg/dL — ABNORMAL HIGH (ref 0.3–1.2)
Total Protein: 7 g/dL (ref 6.0–8.3)

## 2012-04-16 LAB — LIPID PANEL
HDL: 22.8 mg/dL — ABNORMAL LOW (ref >39.00)
LDL Cholesterol: 59 mg/dL (ref 0–99)
Total CHOL/HDL Ratio: 5
VLDL: 31.4 mg/dL (ref 0.0–40.0)

## 2012-04-16 LAB — HEMOGLOBIN A1C: Hgb A1c MFr Bld: 7.2 % — ABNORMAL HIGH (ref 4.6–6.5)

## 2012-04-16 LAB — MICROALBUMIN / CREATININE URINE RATIO: Microalb, Ur: 7.6 mg/dL — ABNORMAL HIGH (ref 0.0–1.9)

## 2012-04-16 LAB — CK: Total CK: 136 U/L (ref 7–232)

## 2012-04-16 LAB — TSH: TSH: 0.38 u[IU]/mL (ref 0.35–5.50)

## 2012-04-16 NOTE — Assessment & Plan Note (Signed)
He is doing well on his current regiemn

## 2012-04-16 NOTE — Assessment & Plan Note (Signed)
His BP is well controlled Today I will check his lytes and renal function 

## 2012-04-16 NOTE — Progress Notes (Signed)
Subjective:    Patient ID: Larry Rangel, male    DOB: 1950-12-24, 62 y.o.   MRN: [id number]  Diabetes He presents for his follow-up diabetic visit. He has type 2 diabetes mellitus. His disease course has been worsening. There are no hypoglycemic associated symptoms. Pertinent negatives for hypoglycemia include no dizziness, headaches, pallor or tremors. Associated symptoms include polydipsia, polyphagia and polyuria. Pertinent negatives for diabetes include no blurred vision, no chest pain, no fatigue, no foot paresthesias, no foot ulcerations, no visual change, no weakness and no weight loss. There are no hypoglycemic complications. Symptoms are stable. Diabetic complications include heart disease. Current diabetic treatment includes oral agent (dual therapy). He is compliant with treatment some of the time. His weight is stable. He is following a generally unhealthy diet. When asked about meal planning, he reported none. He never participates in exercise. There is no change in his home blood glucose trend. His breakfast blood glucose range is generally >200 mg/dl. His lunch blood glucose range is generally >200 mg/dl. His dinner blood glucose range is generally >200 mg/dl. His highest blood glucose is >200 mg/dl. His overall blood glucose range is >200 mg/dl. An ACE inhibitor/angiotensin II receptor blocker is being taken. He does not see a podiatrist.Eye exam is not current.      Review of Systems  Constitutional: Negative.  Negative for fever, chills, weight loss, diaphoresis, activity change, appetite change, fatigue and unexpected weight change.  HENT: Negative.   Eyes: Negative.  Negative for blurred vision.  Respiratory: Negative.  Negative for cough, chest tightness, shortness of breath, wheezing and stridor.   Cardiovascular: Negative.  Negative for chest pain, palpitations and leg swelling.  Gastrointestinal: Negative.  Negative for nausea, vomiting, abdominal pain, diarrhea,  constipation, abdominal distention, anal bleeding and rectal pain.  Endocrine: Positive for polydipsia, polyphagia and polyuria.  Musculoskeletal: Negative.  Negative for myalgias, back pain, joint swelling, arthralgias and gait problem.  Skin: Negative.  Negative for color change, pallor, rash and wound.  Allergic/Immunologic: Negative.   Neurological: Negative.  Negative for dizziness, tremors, weakness, light-headedness and headaches.  Hematological: Negative.  Negative for adenopathy. Does not bruise/bleed easily.  Psychiatric/Behavioral: Negative.        Objective:   Physical Exam  Vitals reviewed. Constitutional: He is oriented to person, place, and time. He appears well-developed and well-nourished. No distress.  HENT:  Head: Normocephalic and atraumatic.  Mouth/Throat: Oropharynx is clear and moist. No oropharyngeal exudate.  Eyes: Conjunctivae are normal. Right eye exhibits no discharge. Left eye exhibits no discharge. No scleral icterus.  Neck: Normal range of motion. Neck supple. No JVD present. No tracheal deviation present. No thyromegaly present.  Cardiovascular: Normal rate, regular rhythm, normal heart sounds and intact distal pulses.  Exam reveals no gallop and no friction rub.   No murmur heard. Pulmonary/Chest: Effort normal and breath sounds normal. No stridor. No respiratory distress. He has no wheezes. He has no rales. He exhibits no tenderness.  Abdominal: Soft. Bowel sounds are normal. He exhibits no distension and no mass. There is no tenderness. There is no rebound and no guarding.  Genitourinary:  He refused this exam today  Musculoskeletal: Normal range of motion. He exhibits no edema and no tenderness.  Lymphadenopathy:    He has no cervical adenopathy.  Neurological: He is oriented to person, place, and time.  Skin: Skin is warm and dry. No rash noted. He is not diaphoretic. No erythema. No pallor.  Psychiatric: He has a normal mood and affect.  His  behavior is normal. Judgment and thought content normal.      Lab Results  Component Value Date   WBC 9.6 03/17/2011   HGB 17.2* 03/17/2011   HCT 50.5 03/17/2011   PLT 137.0* 03/17/2011   GLUCOSE 290* 03/17/2011   CHOL 149 03/17/2011   TRIG 650.0* 03/17/2011   HDL 31.20* 03/17/2011   LDLDIRECT 52.0 03/17/2011   ALT 140* 03/17/2011   AST 101* 03/17/2011   NA 138 03/17/2011   K 3.5 03/17/2011   CL 101 03/17/2011   CREATININE 0.9 03/17/2011   BUN 14 03/17/2011   CO2 29 03/17/2011   TSH 0.64 03/17/2011   PSA 0.85 08/09/2010   INR 1.5 01/26/2007   HGBA1C 10.8* 03/17/2011      Assessment & Plan:

## 2012-04-16 NOTE — Assessment & Plan Note (Signed)
He did agree to have a stool hemoccult done today

## 2012-04-16 NOTE — Assessment & Plan Note (Signed)
His viral hep panel was neg This is most likely EtOH + fatty liver disease I will recheck his LFT's today

## 2012-04-16 NOTE — Patient Instructions (Signed)

## 2012-04-16 NOTE — Assessment & Plan Note (Signed)
His blood sugar has not been well controlled so I have asked him to see ENDO and diabetic education Today I will recheck his a1c and c-peptide, I will monitor his renal function I have asked him to get an eye exam done

## 2012-04-17 LAB — C-PEPTIDE: C-Peptide: 5.51 ng/mL — ABNORMAL HIGH (ref 0.80–3.90)

## 2012-04-19 ENCOUNTER — Other Ambulatory Visit: Payer: Self-pay

## 2012-04-20 ENCOUNTER — Encounter: Payer: Self-pay | Admitting: Internal Medicine

## 2012-04-20 MED ORDER — CLONAZEPAM 0.5 MG PO TABS: 0.5000 mg | ORAL_TABLET | Freq: Two times a day (BID) | ORAL | Status: DC | PRN

## 2012-04-20 NOTE — Telephone Encounter (Signed)
Clonazepam called to pharmacy  

## 2012-04-21 ENCOUNTER — Telehealth: Payer: Self-pay

## 2012-04-21 NOTE — Telephone Encounter (Signed)
I am not willing to prescribe this med for him, he has not been compliant with follow up and has some abnormal labs that have raised some concerns for me so I will not prescribe this as requested

## 2012-04-21 NOTE — Telephone Encounter (Signed)
Pt's spouse called regarding Rx for Clonazepam that was refilled by Dr Debby Bud 04/19/2012. Rx was refilled for # 20 and pt takes medication BID. Spouse is requesting pt's PCP Dr Yetta Barre send new Rx to pharmacy for correct quantity with refills, please advise.

## 2012-05-03 NOTE — Telephone Encounter (Signed)
Several messages left for pt without return call. Closing phone note

## 2012-05-04 ENCOUNTER — Ambulatory Visit: Payer: BC Managed Care – PPO | Admitting: Physician Assistant

## 2012-05-11 ENCOUNTER — Ambulatory Visit: Payer: BC Managed Care – PPO | Admitting: Internal Medicine

## 2012-05-13 ENCOUNTER — Other Ambulatory Visit: Payer: Self-pay | Admitting: Internal Medicine

## 2012-05-13 NOTE — Telephone Encounter (Signed)
Clonazepam called to pharmacy  

## 2012-05-24 ENCOUNTER — Other Ambulatory Visit: Payer: Self-pay | Admitting: Cardiology

## 2012-05-24 ENCOUNTER — Other Ambulatory Visit: Payer: Self-pay | Admitting: Internal Medicine

## 2012-05-26 ENCOUNTER — Other Ambulatory Visit: Payer: Self-pay | Admitting: Cardiology

## 2012-05-26 ENCOUNTER — Other Ambulatory Visit (HOSPITAL_COMMUNITY): Payer: Self-pay | Admitting: *Deleted

## 2012-06-02 ENCOUNTER — Telehealth: Payer: Self-pay | Admitting: Internal Medicine

## 2012-06-02 NOTE — Telephone Encounter (Signed)
Pt's spouse called req refill for Colonazepam .5mg  for 60 pill taking 2 a day. Pt only 2 pill left and was wondering if this ok to refill. Please advise

## 2012-06-03 ENCOUNTER — Other Ambulatory Visit: Payer: Self-pay | Admitting: Internal Medicine

## 2012-06-03 NOTE — Telephone Encounter (Signed)
Faxed hardcopy to [facility] 

## 2012-06-03 NOTE — Telephone Encounter (Signed)
Done hardcopy to [facility]  

## 2012-06-04 ENCOUNTER — Other Ambulatory Visit: Payer: Self-pay | Admitting: Internal Medicine

## 2012-06-28 ENCOUNTER — Other Ambulatory Visit: Payer: Self-pay | Admitting: Internal Medicine

## 2012-06-28 ENCOUNTER — Telehealth: Payer: Self-pay

## 2012-06-28 NOTE — Telephone Encounter (Signed)
Pt wife notifed med refill called in for clonazepam

## 2012-07-05 ENCOUNTER — Ambulatory Visit (INDEPENDENT_AMBULATORY_CARE_PROVIDER_SITE_OTHER): Payer: BC Managed Care – PPO | Admitting: Physician Assistant

## 2012-07-05 ENCOUNTER — Encounter: Payer: Self-pay | Admitting: Physician Assistant

## 2012-07-05 VITALS — BP 134/82 | HR 79 | Ht 74.0 in | Wt 226.4 lb

## 2012-07-05 DIAGNOSIS — I2510 Atherosclerotic heart disease of native coronary artery without angina pectoris: Secondary | ICD-10-CM

## 2012-07-05 DIAGNOSIS — I4892 Unspecified atrial flutter: Secondary | ICD-10-CM

## 2012-07-05 DIAGNOSIS — I4891 Unspecified atrial fibrillation: Secondary | ICD-10-CM

## 2012-07-05 DIAGNOSIS — E785 Hyperlipidemia, unspecified: Secondary | ICD-10-CM

## 2012-07-05 DIAGNOSIS — R0602 Shortness of breath: Secondary | ICD-10-CM

## 2012-07-05 DIAGNOSIS — I10 Essential (primary) hypertension: Secondary | ICD-10-CM

## 2012-07-05 MED ORDER — RIVAROXABAN 20 MG PO TABS: 20.0000 mg | ORAL_TABLET | Freq: Every day | ORAL | Status: DC

## 2012-07-05 NOTE — Progress Notes (Addendum)
[address] [place], [place]  [address] Phone: [phone] Fax:  [phone]  Date:  07/05/2012   ID:  Larry Rangel, DOB 07-15-1950, MRN [id number]  PCP:  Sanda Linger, MD  Cardiologist:  Dr. Valera Castle     History of Present Illness: Larry Rangel is a 62 y.o. male who returns for f/u.    He has a history of HTN, HL, DM2, tobacco abuse.  LHC 12/2006:  Proximal LAD 30-40%, mid LAD 60-70%, ostial diagonal 70%, proximal OM 30-40%, mid OM 50% left PL branch 50%, proximal RCA 75%, then 60-70% mid, mid to distal RCA 40%, EF 60%.  He failed medical therapy and ultimately underwent CABG 01/2007 (LIMA-LAD, free RIMA-distal RCA, SVG-D2, SVG-OM1 and left PL).  Last seen by Dr. Daleen Squibb 09/2010.  2 year follow up was recommended.  Today, he notes that he has been short of breath with certain activities for the last 1-1 1/2 years.  It is not getting any worse.  There are times he can exert himself without dyspnea.  There are times where certain activities cause dyspnea that seems out of proportion to what he is doing.  He continues to swim and walk several miles a day.  He denies difficulty with this.  He does have occasional chest pain. This occurs with "stress."  No exertional pain or assoc symptoms.  No orthopnea, PND, edema.  No syncope.    Labs (4/14):  K 4.2, Cr 1.0 ALT 65, LDL 59, Hgb 16.6, TSH 0.38   Wt Readings from Last 3 Encounters:  07/05/12 226 lb 6.4 oz (102.694 kg)  04/16/12 227 lb (102.967 kg)  03/17/11 225 lb (102.059 kg)     Past Medical History  Diagnosis Date  . Anxiety   . Hypertension   . Low back pain   . Tobacco user   . CAD (coronary artery disease) of artery bypass graft   . Hyperlipidemia   . S/P colonoscopy     Current Outpatient Prescriptions  Medication Sig Dispense Refill  . amLODipine-valsartan (EXFORGE) 5-320 MG per tablet Take 1 tablet by mouth daily.  90 tablet  3  . aspirin 325 MG EC tablet Take 325 mg by mouth daily.        .  busPIRone (BUSPAR) 10 MG tablet TAKE 1 TABLET TWICE A DAY  180 tablet  1  . clonazePAM (KLONOPIN) 0.5 MG tablet TAKE 1 TABLET BY MOUTH TWICE A DAY AS NEEDED  60 tablet  1  . cyclobenzaprine (FLEXERIL) 10 MG tablet TAKE 1 TABLET BY MOUTH 3 TIMES A DAY AS NEEDED  90 tablet  5  . LIPOFEN 150 MG CAPS TAKE ONE CAPSULE BY MOUTH DAILY  90 capsule  1  . Nebivolol HCl (BYSTOLIC) 20 MG TABS Take 1 tablet (20 mg total) by mouth daily.  90 tablet  3  . Omega-3 Fatty Acids (FISH OIL PO) Take by mouth daily.      Marland Kitchen POTASSIUM PO Take by mouth daily.      . simvastatin (ZOCOR) 20 MG tablet TAKE 1 TABLET (20 MG TOTAL) BY MOUTH AT BEDTIME.  90 tablet  1   No current facility-administered medications for this visit.    Allergies:   No Known Allergies  Social History:  The patient  reports that he quit smoking about 17 months ago. His smoking use included Cigarettes. He has a 40 pack-year smoking history. He has never used smokeless tobacco. He reports that he drinks about  14.4 ounces of alcohol per week. He reports that he does not use illicit drugs.   ROS:  Please see the history of present illness.   He has an occ cough.   All other systems reviewed and negative.   PHYSICAL EXAM: VS:  BP 134/82  Pulse 79  Ht 6\' 2"  (1.88 m)  Wt 226 lb 6.4 oz (102.694 kg)  BMI 29.06 kg/m2 Well nourished, well developed, in no acute distress HEENT: normal Neck: no JVD Vascular:  No carotid bruits Endo:  No TM. Cardiac:  normal S1, S2; RRR; no murmur Lungs:  clear to auscultation bilaterally, no wheezing, rhonchi or rales Abd: soft, nontender, no hepatomegaly Ext: no edema Skin: warm and dry Neuro:  CNs 2-12 intact, no focal abnormalities noted  EKG:  AFlutter, HR 79, 3:1 AV conduction     ASSESSMENT AND PLAN:  1. Atrial Flutter:  This likely explains his dyspnea.  Rate is controlled.  CHADS2-VASc=3.  It looks like he has typical counterclockwise flutter.  This could be potentially ablated.  This would likely  obviate the need for long term anticoagulation.  I d/w Dr. Sherryl Manges (DOD).  We will start Xarelto 20 mg QD (Creatinine Clearance >100).  Arrange 2D echo.  Schedule appt with Dr. Sherryl Manges in 2-3 weeks.  Will also arrange anesthesia assisted RFCA of AFlutter in ~4 weeks. 2. CAD:  No clear anginal symptoms.  He may hold his ASA while on Xarelto.  Check echo.  Continue statin. 3. Hypertension:  Controlled.  4. Hyperlipidemia:  Continue statin. 5. Disposition:  F/u with Dr. Sherryl Manges in 2-3 weeks.  Signed, Tereso Newcomer, PA-C  07/05/2012 3:40 PM     Addendum 07/14/2012 7:24 AM: Echo demonstrates reduced LVF which is new. Echo 6/14: Mild LVH, EF 30-35%, diffuse HK, MAC, mild BAE. Reviewed with Dr. Sherryl Manges.  Will go ahead and proceed with TEE-DCCV to restore NSR.  This will allow the ability to get him off anticoagulation sooner should he need ischemic evaluation.  Will likely need re-assessment of LVF after restoring NSR to determine if reduced EF is from being in AFlutter or not.   Discussed with patient and wife.  TEE-DCCV set for this week. Importance of not missing Xarelto discussed. Patient will keep appt with Dr. Sherryl Manges as planned as well as RFCA for AFlutter which is set for later this month.   Signed,  Tereso Newcomer, PA-C   07/14/2012 7:27 AM

## 2012-07-05 NOTE — Patient Instructions (Addendum)
START XARELTO 20 MG DAILY; MAKE SURE ONCE YOU START THE XARELTO YOU STOP TAKING THE ASPIRIN  PLEASE SCHEDULE ECHO  YOU WILL NEED AN APPT WITH DR. Graciela Husbands 1-2 WEEKS BEFORE YOUR ABLATION  DR. KLEIN'S NURSE HEATHER WILL CALL YOU IN THE NEXT DAY OR TWO TO SCHEDULE YOU FOR A ANESTHESIA SUPPORTED A-FLUTTER ABLATION TO BE DONE IN 4 WEEKS WITH DR. Graciela Husbands

## 2012-07-06 ENCOUNTER — Telehealth: Payer: Self-pay | Admitting: *Deleted

## 2012-07-06 NOTE — Telephone Encounter (Signed)
I attempted to call the patient to schedule his a-flutter ablation with anesthesia and his appointment with Dr. Graciela Husbands in about 2 weeks. I left a message on his voice mail I will be out this afternoon and tomorrow. I will call him back on Thursday.

## 2012-07-08 NOTE — Telephone Encounter (Signed)
I spoke with the patient's wife regarding the patient's appointment with Dr. Graciela Husbands and the need for a-flutter ablation. The patient will see Dr. Graciela Husbands on 7/10. I have scheduled him for his a-flutter ablation on 08/06/12. I will give him his instructions at his appointment on 7/10 with Dr. Graciela Husbands.

## 2012-07-12 ENCOUNTER — Encounter: Payer: Self-pay | Admitting: Physician Assistant

## 2012-07-12 ENCOUNTER — Ambulatory Visit (HOSPITAL_COMMUNITY): Payer: BC Managed Care – PPO | Attending: Cardiology | Admitting: Radiology

## 2012-07-12 DIAGNOSIS — E119 Type 2 diabetes mellitus without complications: Secondary | ICD-10-CM | POA: Insufficient documentation

## 2012-07-12 DIAGNOSIS — I4892 Unspecified atrial flutter: Secondary | ICD-10-CM | POA: Insufficient documentation

## 2012-07-12 DIAGNOSIS — R0602 Shortness of breath: Secondary | ICD-10-CM

## 2012-07-12 DIAGNOSIS — I2510 Atherosclerotic heart disease of native coronary artery without angina pectoris: Secondary | ICD-10-CM | POA: Insufficient documentation

## 2012-07-12 DIAGNOSIS — I10 Essential (primary) hypertension: Secondary | ICD-10-CM | POA: Insufficient documentation

## 2012-07-12 DIAGNOSIS — I4891 Unspecified atrial fibrillation: Secondary | ICD-10-CM

## 2012-07-12 DIAGNOSIS — F17200 Nicotine dependence, unspecified, uncomplicated: Secondary | ICD-10-CM | POA: Insufficient documentation

## 2012-07-12 NOTE — Progress Notes (Signed)
Echocardiogram performed.  

## 2012-07-13 ENCOUNTER — Encounter: Payer: Self-pay | Admitting: *Deleted

## 2012-07-13 ENCOUNTER — Telehealth: Payer: Self-pay | Admitting: Physician Assistant

## 2012-07-13 DIAGNOSIS — I4892 Unspecified atrial flutter: Secondary | ICD-10-CM

## 2012-07-13 HISTORY — DX: Unspecified atrial flutter: I48.92

## 2012-07-13 NOTE — Telephone Encounter (Signed)
New Problem:    Patient's wife called in returning a call regarding her husband.  Please call back.

## 2012-07-13 NOTE — Telephone Encounter (Signed)
both [employer]. PAC and I d/w pt's wife about TEE/DCCV scheduled for 07/15/12 @ 9 am with Dr. Elease Hashimoto at [facility]. PA explained reason for procedure. Wife gave verbal understanding and will pick up procedure inst. letter at the front desk tomorrow

## 2012-07-13 NOTE — Telephone Encounter (Signed)
both [facility] PAC and I d/w pt's wife about TEE/DCCV scheduled for 07/15/12 @ 9 am with Dr. Nahser at [facility]. PA explained reason for procedure. Wife gave verbal understanding and will pick up procedure inst. letter at the front desk tomorrow 

## 2012-07-14 ENCOUNTER — Encounter: Payer: Self-pay | Admitting: *Deleted

## 2012-07-14 ENCOUNTER — Other Ambulatory Visit: Payer: Self-pay | Admitting: *Deleted

## 2012-07-14 NOTE — Addendum Note (Signed)
Addended byAlben Spittle, Lorin Picket T on: 07/14/2012 07:34 AM   Modules accepted: Orders

## 2012-07-14 NOTE — H&P (Signed)
History and Physical  Date:  07/05/2012   ID:  Larry PORCARO, DOB Apr 12, 1950, MRN [id number]  PCP:  Sanda Linger, MD  Cardiologist:  Dr. Valera Castle     History of Present Illness: Larry Rangel is a 62 y.o. male who returns for f/u.    He has a history of HTN, HL, DM2, tobacco abuse.  LHC 12/2006:  Proximal LAD 30-40%, mid LAD 60-70%, ostial diagonal 70%, proximal OM 30-40%, mid OM 50% left PL branch 50%, proximal RCA 75%, then 60-70% mid, mid to distal RCA 40%, EF 60%.  He failed medical therapy and ultimately underwent CABG 01/2007 (LIMA-LAD, free RIMA-distal RCA, SVG-D2, SVG-OM1 and left PL).  Last seen by Dr. Daleen Squibb 09/2010.  2 year follow up was recommended.  Today, he notes that he has been short of breath with certain activities for the last 1-1 1/2 years.  It is not getting any worse.  There are times he can exert himself without dyspnea.  There are times where certain activities cause dyspnea that seems out of proportion to what he is doing.  He continues to swim and walk several miles a day.  He denies difficulty with this.  He does have occasional chest pain. This occurs with "stress."  No exertional pain or assoc symptoms.  No orthopnea, PND, edema.  No syncope.    Labs (4/14):  K 4.2, Cr 1.0 ALT 65, LDL 59, Hgb 16.6, TSH 0.38   Wt Readings from Last 3 Encounters:  07/05/12 226 lb 6.4 oz (102.694 kg)  04/16/12 227 lb (102.967 kg)  03/17/11 225 lb (102.059 kg)     Past Medical History  Diagnosis Date  . Anxiety   . Hypertension   . Low back pain   . Tobacco user   . CAD (coronary artery disease) of artery bypass graft   . Hyperlipidemia   . S/P colonoscopy     Current Outpatient Prescriptions  Medication Sig Dispense Refill  . amLODipine-valsartan (EXFORGE) 5-320 MG per tablet Take 1 tablet by mouth daily.  90 tablet  3  . aspirin 325 MG EC tablet Take 325 mg by mouth daily.        . busPIRone (BUSPAR) 10 MG tablet TAKE 1 TABLET TWICE A DAY  180 tablet  1  .  clonazePAM (KLONOPIN) 0.5 MG tablet TAKE 1 TABLET BY MOUTH TWICE A DAY AS NEEDED  60 tablet  1  . cyclobenzaprine (FLEXERIL) 10 MG tablet TAKE 1 TABLET BY MOUTH 3 TIMES A DAY AS NEEDED  90 tablet  5  . LIPOFEN 150 MG CAPS TAKE ONE CAPSULE BY MOUTH DAILY  90 capsule  1  . Nebivolol HCl (BYSTOLIC) 20 MG TABS Take 1 tablet (20 mg total) by mouth daily.  90 tablet  3  . Omega-3 Fatty Acids (FISH OIL PO) Take by mouth daily.      Marland Kitchen POTASSIUM PO Take by mouth daily.      . simvastatin (ZOCOR) 20 MG tablet TAKE 1 TABLET (20 MG TOTAL) BY MOUTH AT BEDTIME.  90 tablet  1   No current facility-administered medications for this visit.    Allergies:   No Known Allergies  Social History:  The patient  reports that he quit smoking about 17 months ago. His smoking use included Cigarettes. He has a 40 pack-year smoking history. He has never used smokeless tobacco. He reports that he drinks about 14.4 ounces of alcohol per week. He reports that he does not use illicit  drugs.   Family History:  The patient's family history includes Coronary artery disease in his mother and Heart failure (age of onset: 84) in his father.  There is no history of Cancer, and Early death, and Heart disease, and Hyperlipidemia, and Hypertension, and Kidney disease, and Stroke, .   ROS:  Please see the history of present illness.   He has an occ cough.   All other systems reviewed and negative.   PHYSICAL EXAM: VS:  BP 134/82  Pulse 79  Ht 6\' 2"  (1.88 m)  Wt 226 lb 6.4 oz (102.694 kg)  BMI 29.06 kg/m2 Well nourished, well developed, in no acute distress HEENT: normal Neck: no JVD Vascular:  No carotid bruits Endo:  No TM. Cardiac:  normal S1, S2; RRR; no murmur Lungs:  clear to auscultation bilaterally, no wheezing, rhonchi or rales Abd: soft, nontender, no hepatomegaly Ext: no edema Skin: warm and dry Neuro:  CNs 2-12 intact, no focal abnormalities noted  EKG:  AFlutter, HR 79, 3:1 AV conduction     ASSESSMENT AND  PLAN:  1. Atrial Flutter:  This likely explains his dyspnea.  Rate is controlled.  CHADS2-VASc=3.  It looks like he has typical counterclockwise flutter.  This could be potentially ablated.  This would likely obviate the need for long term anticoagulation.  I d/w Dr. Sherryl Manges (DOD).  We will start Xarelto 20 mg QD (Creatinine Clearance >100).  Arrange 2D echo.  Schedule appt with Dr. Sherryl Manges in 2-3 weeks.  Will also arrange anesthesia assisted RFCA of AFlutter in ~4 weeks. 2. CAD:  No clear anginal symptoms.  He may hold his ASA while on Xarelto.  Check echo.  Continue statin. 3. Hypertension:  Controlled.  4. Hyperlipidemia:  Continue statin. 5. Disposition:  F/u with Dr. Sherryl Manges in 2-3 weeks.  Signed, Tereso Newcomer, PA-C  07/05/2012 3:40 PM     Addendum 07/14/2012 7:24 AM: Echo demonstrates reduced LVF which is new. Echo 6/14: Mild LVH, EF 30-35%, diffuse HK, MAC, mild BAE. Reviewed with Dr. Sherryl Manges.  Will go ahead and proceed with TEE-DCCV to restore NSR.  This will allow the ability to get him off anticoagulation sooner should he need ischemic evaluation.  Will likely need re-assessment of LVF after restoring NSR to determine if reduced EF is from being in AFlutter or not.   Discussed with patient and wife.  TEE-DCCV set for this week. Importance of not missing Xarelto discussed. Patient will keep appt with Dr. Sherryl Manges as planned as well as RFCA for AFlutter which is set for later this month.    Signed,  Tereso Newcomer, PA-C   07/14/2012 7:27 AM

## 2012-07-15 ENCOUNTER — Ambulatory Visit (HOSPITAL_COMMUNITY): Payer: BC Managed Care – PPO | Admitting: Anesthesiology

## 2012-07-15 ENCOUNTER — Encounter (HOSPITAL_COMMUNITY)
Admission: RE | Disposition: A | Discharge status: Home / Self Care | Payer: Self-pay | Source: Ambulatory Visit | Attending: Cardiovascular Disease

## 2012-07-15 ENCOUNTER — Encounter (HOSPITAL_COMMUNITY): Payer: Self-pay | Admitting: Anesthesiology

## 2012-07-15 ENCOUNTER — Encounter (HOSPITAL_COMMUNITY): Payer: Self-pay | Admitting: *Deleted

## 2012-07-15 ENCOUNTER — Ambulatory Visit (HOSPITAL_COMMUNITY)
Admission: RE | Admit: 2012-07-15 | Discharge: 2012-07-15 | Disposition: A | Discharge status: Home / Self Care | Payer: BC Managed Care – PPO | Source: Ambulatory Visit | Attending: Cardiovascular Disease | Admitting: Cardiovascular Disease

## 2012-07-15 DIAGNOSIS — I4892 Unspecified atrial flutter: Secondary | ICD-10-CM

## 2012-07-15 DIAGNOSIS — Z79899 Other long term (current) drug therapy: Secondary | ICD-10-CM | POA: Insufficient documentation

## 2012-07-15 DIAGNOSIS — Z7901 Long term (current) use of anticoagulants: Secondary | ICD-10-CM | POA: Insufficient documentation

## 2012-07-15 DIAGNOSIS — I2510 Atherosclerotic heart disease of native coronary artery without angina pectoris: Secondary | ICD-10-CM | POA: Insufficient documentation

## 2012-07-15 DIAGNOSIS — M545 Low back pain, unspecified: Secondary | ICD-10-CM | POA: Insufficient documentation

## 2012-07-15 DIAGNOSIS — E785 Hyperlipidemia, unspecified: Secondary | ICD-10-CM | POA: Insufficient documentation

## 2012-07-15 DIAGNOSIS — Z7982 Long term (current) use of aspirin: Secondary | ICD-10-CM | POA: Insufficient documentation

## 2012-07-15 DIAGNOSIS — I10 Essential (primary) hypertension: Secondary | ICD-10-CM | POA: Insufficient documentation

## 2012-07-15 DIAGNOSIS — F411 Generalized anxiety disorder: Secondary | ICD-10-CM | POA: Insufficient documentation

## 2012-07-15 DIAGNOSIS — Z8249 Family history of ischemic heart disease and other diseases of the circulatory system: Secondary | ICD-10-CM | POA: Insufficient documentation

## 2012-07-15 DIAGNOSIS — Z87891 Personal history of nicotine dependence: Secondary | ICD-10-CM | POA: Insufficient documentation

## 2012-07-15 DIAGNOSIS — E119 Type 2 diabetes mellitus without complications: Secondary | ICD-10-CM | POA: Insufficient documentation

## 2012-07-15 HISTORY — DX: Unspecified osteoarthritis, unspecified site: M19.90

## 2012-07-15 HISTORY — PX: CARDIOVERSION: SHX1299

## 2012-07-15 HISTORY — PX: TEE WITHOUT CARDIOVERSION: SHX5443

## 2012-07-15 HISTORY — DX: Acute myocardial infarction, unspecified: I21.9

## 2012-07-15 LAB — GLUCOSE, CAPILLARY: Glucose-Capillary: 165 mg/dL — ABNORMAL HIGH (ref 70–99)

## 2012-07-15 SURGERY — ECHOCARDIOGRAM, TRANSESOPHAGEAL
Anesthesia: General

## 2012-07-15 MED ORDER — FENTANYL CITRATE 0.05 MG/ML IJ SOLN
INTRAMUSCULAR | Status: DC | PRN
  Administered 2012-07-15: 50 ug via INTRAVENOUS
  Administered 2012-07-15 (×2): 25 ug via INTRAVENOUS

## 2012-07-15 MED ORDER — FENTANYL CITRATE 0.05 MG/ML IJ SOLN
INTRAMUSCULAR | Status: AC
  Filled 2012-07-15: qty 2

## 2012-07-15 MED ORDER — MIDAZOLAM HCL 10 MG/2ML IJ SOLN
INTRAMUSCULAR | Status: DC | PRN
  Administered 2012-07-15: 2 mg via INTRAVENOUS
  Administered 2012-07-15: 4 mg via INTRAVENOUS
  Administered 2012-07-15: 2 mg via INTRAVENOUS
  Administered 2012-07-15 (×2): 1 mg via INTRAVENOUS

## 2012-07-15 MED ORDER — LIDOCAINE HCL (CARDIAC) 20 MG/ML IV SOLN
INTRAVENOUS | Status: DC | PRN
  Administered 2012-07-15: 20 mg via INTRAVENOUS

## 2012-07-15 MED ORDER — BUTAMBEN-TETRACAINE-BENZOCAINE 2-2-14 % EX AERO
INHALATION_SPRAY | CUTANEOUS | Status: DC | PRN
  Administered 2012-07-15: 2 via TOPICAL

## 2012-07-15 MED ORDER — PROPOFOL 10 MG/ML IV BOLUS
INTRAVENOUS | Status: DC | PRN
  Administered 2012-07-15: 60 mg via INTRAVENOUS

## 2012-07-15 MED ORDER — MIDAZOLAM HCL 5 MG/ML IJ SOLN
INTRAMUSCULAR | Status: AC
  Filled 2012-07-15: qty 1

## 2012-07-15 MED ORDER — SODIUM CHLORIDE 0.9 % IV SOLN
INTRAVENOUS | Status: DC
  Administered 2012-07-15: 08:00:00 via INTRAVENOUS

## 2012-07-15 MED ORDER — MIDAZOLAM HCL 5 MG/ML IJ SOLN
INTRAMUSCULAR | Status: AC
  Filled 2012-07-15: qty 2

## 2012-07-15 NOTE — Anesthesia Preprocedure Evaluation (Addendum)
Anesthesia Evaluation  Patient identified by MRN, date of birth, ID band Patient awake    Reviewed: Allergy & Precautions, H&P , NPO status , Patient's Chart, lab work & pertinent test results, reviewed documented beta blocker date and time   History of Anesthesia Complications Negative for: history of anesthetic complications  Airway Mallampati: II TM Distance: >3 FB Neck ROM: Full    Dental  (+) Dental Advisory Given   Pulmonary shortness of breath, former smoker (quit '13),  breath sounds clear to auscultation  Pulmonary exam normal       Cardiovascular hypertension, Pt. on medications and Pt. on home beta blockers + CAD, + Past MI and + CABG Rhythm:Irregular Rate:Normal  6/14 ECHO: EF 30-35% with global hypokinesis   Neuro/Psych PSYCHIATRIC DISORDERS Anxiety negative neurological ROS     GI/Hepatic negative GI ROS, Neg liver ROS,   Endo/Other  diabetes (glu 165, diet management), Type 2  Renal/GU negative Renal ROS     Musculoskeletal   Abdominal (+) + obese,   Peds  Hematology   Anesthesia Other Findings   Reproductive/Obstetrics                        Anesthesia Physical Anesthesia Plan  ASA: III  Anesthesia Plan: General   Post-op Pain Management:    Induction: Intravenous  Airway Management Planned: Mask  Additional Equipment:   Intra-op Plan:   Post-operative Plan:   Informed Consent: I have reviewed the patients History and Physical, chart, labs and discussed the procedure including the risks, benefits and alternatives for the proposed anesthesia with the patient or authorized representative who has indicated his/her understanding and acceptance.   Dental advisory given  Plan Discussed with: Anesthesiologist, Surgeon and CRNA  Anesthesia Plan Comments: (Plan routine monitors, GA for cardioversion)       Anesthesia Quick Evaluation

## 2012-07-15 NOTE — H&P (View-Only) (Signed)
[address], [address] [place], [place]  [address] Phone: [phone] Fax:  [phone]  Date:  07/05/2012   ID:  Larry Rangel, DOB 1950-07-11, MRN [id number]  PCP:  Sanda Linger, MD  Cardiologist:  Dr. Valera Castle     History of Present Illness: Larry Rangel is a 62 y.o. male who returns for f/u.    He has a history of HTN, HL, DM2, tobacco abuse.  LHC 12/2006:  Proximal LAD 30-40%, mid LAD 60-70%, ostial diagonal 70%, proximal OM 30-40%, mid OM 50% left PL branch 50%, proximal RCA 75%, then 60-70% mid, mid to distal RCA 40%, EF 60%.  He failed medical therapy and ultimately underwent CABG 01/2007 (LIMA-LAD, free RIMA-distal RCA, SVG-D2, SVG-OM1 and left PL).  Last seen by Dr. Daleen Squibb 09/2010.  2 year follow up was recommended.  Today, he notes that he has been short of breath with certain activities for the last 1-1 1/2 years.  It is not getting any worse.  There are times he can exert himself without dyspnea.  There are times where certain activities cause dyspnea that seems out of proportion to what he is doing.  He continues to swim and walk several miles a day.  He denies difficulty with this.  He does have occasional chest pain. This occurs with "stress."  No exertional pain or assoc symptoms.  No orthopnea, PND, edema.  No syncope.    Labs (4/14):  K 4.2, Cr 1.0 ALT 65, LDL 59, Hgb 16.6, TSH 0.38   Wt Readings from Last 3 Encounters:  07/05/12 226 lb 6.4 oz (102.694 kg)  04/16/12 227 lb (102.967 kg)  03/17/11 225 lb (102.059 kg)     Past Medical History  Diagnosis Date  . Anxiety   . Hypertension   . Low back pain   . Tobacco user   . CAD (coronary artery disease) of artery bypass graft   . Hyperlipidemia   . S/P colonoscopy     Current Outpatient Prescriptions  Medication Sig Dispense Refill  . amLODipine-valsartan (EXFORGE) 5-320 MG per tablet Take 1 tablet by mouth daily.  90 tablet  3  . aspirin 325 MG EC tablet Take 325 mg by mouth daily.        .  busPIRone (BUSPAR) 10 MG tablet TAKE 1 TABLET TWICE A DAY  180 tablet  1  . clonazePAM (KLONOPIN) 0.5 MG tablet TAKE 1 TABLET BY MOUTH TWICE A DAY AS NEEDED  60 tablet  1  . cyclobenzaprine (FLEXERIL) 10 MG tablet TAKE 1 TABLET BY MOUTH 3 TIMES A DAY AS NEEDED  90 tablet  5  . LIPOFEN 150 MG CAPS TAKE ONE CAPSULE BY MOUTH DAILY  90 capsule  1  . Nebivolol HCl (BYSTOLIC) 20 MG TABS Take 1 tablet (20 mg total) by mouth daily.  90 tablet  3  . Omega-3 Fatty Acids (FISH OIL PO) Take by mouth daily.      Marland Kitchen POTASSIUM PO Take by mouth daily.      . simvastatin (ZOCOR) 20 MG tablet TAKE 1 TABLET (20 MG TOTAL) BY MOUTH AT BEDTIME.  90 tablet  1   No current facility-administered medications for this visit.    Allergies:   No Known Allergies  Social History:  The patient  reports that he quit smoking about 17 months ago. His smoking use included Cigarettes. He has a 40 pack-year smoking history. He has never used smokeless tobacco. He reports that he drinks about  14.4 ounces of alcohol per week. He reports that he does not use illicit drugs.   ROS:  Please see the history of present illness.   He has an occ cough.   All other systems reviewed and negative.   PHYSICAL EXAM: VS:  BP 134/82  Pulse 79  Ht 6\' 2"  (1.88 m)  Wt 226 lb 6.4 oz (102.694 kg)  BMI 29.06 kg/m2 Well nourished, well developed, in no acute distress HEENT: normal Neck: no JVD Vascular:  No carotid bruits Endo:  No TM. Cardiac:  normal S1, S2; RRR; no murmur Lungs:  clear to auscultation bilaterally, no wheezing, rhonchi or rales Abd: soft, nontender, no hepatomegaly Ext: no edema Skin: warm and dry Neuro:  CNs 2-12 intact, no focal abnormalities noted  EKG:  AFlutter, HR 79, 3:1 AV conduction     ASSESSMENT AND PLAN:  1. Atrial Flutter:  This likely explains his dyspnea.  Rate is controlled.  CHADS2-VASc=3.  It looks like he has typical counterclockwise flutter.  This could be potentially ablated.  This would likely  obviate the need for long term anticoagulation.  I d/w Dr. Sherryl Manges (DOD).  We will start Xarelto 20 mg QD (Creatinine Clearance >100).  Arrange 2D echo.  Schedule appt with Dr. Sherryl Manges in 2-3 weeks.  Will also arrange anesthesia assisted RFCA of AFlutter in ~4 weeks. 2. CAD:  No clear anginal symptoms.  He may hold his ASA while on Xarelto.  Check echo.  Continue statin. 3. Hypertension:  Controlled.  4. Hyperlipidemia:  Continue statin. 5. Disposition:  F/u with Dr. Sherryl Manges in 2-3 weeks.  Signed, Tereso Newcomer, PA-C  07/05/2012 3:40 PM     Addendum 07/14/2012 7:24 AM: Echo demonstrates reduced LVF which is new. Echo 6/14: Mild LVH, EF 30-35%, diffuse HK, MAC, mild BAE. Reviewed with Dr. Sherryl Manges.  Will go ahead and proceed with TEE-DCCV to restore NSR.  This will allow the ability to get him off anticoagulation sooner should he need ischemic evaluation.  Will likely need re-assessment of LVF after restoring NSR to determine if reduced EF is from being in AFlutter or not.   Discussed with patient and wife.  TEE-DCCV set for this week. Importance of not missing Xarelto discussed. Patient will keep appt with Dr. Sherryl Manges as planned as well as RFCA for AFlutter which is set for later this month.   Signed,  Tereso Newcomer, PA-C   07/14/2012 7:27 AM

## 2012-07-15 NOTE — Transfer of Care (Signed)
Immediate Anesthesia Transfer of Care Note  Patient: Larry Rangel  Procedure(s) Performed: Procedure(s): TRANSESOPHAGEAL ECHOCARDIOGRAM (TEE) (N/A) CARDIOVERSION (N/A)  Patient Location: Endoscopy Unit  Anesthesia Type:General  Level of Consciousness: awake and alert   Airway & Oxygen Therapy: Patient Spontanous Breathing and Patient connected to nasal cannula oxygen  Post-op Assessment: Report given to PACU RN, Post -op Vital signs reviewed and stable and Patient moving all extremities X 4  Post vital signs: Reviewed and stable  Complications: No apparent anesthesia complications

## 2012-07-15 NOTE — Progress Notes (Signed)
  Echocardiogram Echocardiogram Transesophageal has been performed.  Larry Rangel 07/15/2012, 10:06 AM

## 2012-07-15 NOTE — Preoperative (Signed)
Beta Blockers   Reason not to administer Beta Blockers:Not Applicable, pt took bystolic 07/15/12

## 2012-07-15 NOTE — Anesthesia Postprocedure Evaluation (Signed)
  Anesthesia Post-op Note  Patient: Larry Rangel  Procedure(s) Performed: Procedure(s): TRANSESOPHAGEAL ECHOCARDIOGRAM (TEE) (N/A) CARDIOVERSION (N/A)  Patient Location: Endoscopy Unit  Anesthesia Type:General  Level of Consciousness: awake, alert  and oriented  Airway and Oxygen Therapy: Patient Spontanous Breathing and Patient connected to nasal cannula oxygen  Post-op Pain: none  Post-op Assessment: Post-op Vital signs reviewed, Patient's Cardiovascular Status Stable, Respiratory Function Stable, Patent Airway and No signs of Nausea or vomiting  Post-op Vital Signs: Reviewed and stable  Complications: No apparent anesthesia complications

## 2012-07-15 NOTE — CV Procedure (Signed)
    Transesophageal Echocardiogram Note  Larry Rangel [id number] 07-Aug-1950  Procedure: Transesophageal Echocardiogram Indications: atrial flutter  Procedure Details Consent: Obtained Time Out: Verified patient identification, verified procedure, site/side was marked, verified correct patient position, special equipment/implants available, Radiology Safety Procedures followed,  medications/allergies/relevent history reviewed, required imaging and test results available.  Performed  Medications: Fentanyl: 100 mcg IV Versed: 10 mg IV  Left Ventrical:  Moderate LV dysfunction.  EF 35-40%  Mitral Valve: normal MV. Mild MR  Aortic Valve: mildly calcified,  No AS or AI  Tricuspid Valve: mild TR  Pulmonic Valve: normal valve  Left Atrium/ Left atrial appendage: normal , good function,  No thrombus  Atrial septum: no PFO or ASD  Aorta: mild calcification.   Complications: No apparent complications Patient did tolerate procedure well.  Will proceed with cardioversion.      Cardioversion Note  Larry Rangel [id number] [birth year]  Procedure: DC Cardioversion Indications: atrial flutter  Procedure Details Consent: Obtained Time Out: Verified patient identification, verified procedure, site/side was marked, verified correct patient position, special equipment/implants available, Radiology Safety Procedures followed,  medications/allergies/relevent history reviewed, required imaging and test results available.  Performed  The patient has been on adequate anticoagulation.  The patient received IV Propofol 60 mg  for sedation.  Synchronous cardioversion was performed at 50  joules.  The cardioversion was successful.    Complications: No apparent complications Patient did tolerate procedure well.   Vesta Mixer, Montez Hageman., MD, [facility] 07/15/2012, 9:50 AM         Vesta Mixer, Montez Hageman., MD, [facility] 07/15/2012, 9:37 AM

## 2012-07-15 NOTE — Interval H&P Note (Signed)
History and Physical Interval Note:  07/15/2012 9:14 AM  Larry Rangel  has presented today for surgery, with the diagnosis of AFLUTTER  The various methods of treatment have been discussed with the patient and family. After consideration of risks, benefits and other options for treatment, the patient has consented to  Procedure(s): TRANSESOPHAGEAL ECHOCARDIOGRAM (TEE) (N/A) CARDIOVERSION (N/A) as a surgical intervention .  The patient's history has been reviewed, patient examined, no change in status, stable for surgery.  I have reviewed the patient's chart and labs.  Questions were answered to the patient's satisfaction.     Elyn Aquas.

## 2012-07-16 ENCOUNTER — Encounter (HOSPITAL_COMMUNITY): Payer: Self-pay | Admitting: Cardiovascular Disease

## 2012-07-21 ENCOUNTER — Encounter: Payer: Self-pay | Admitting: *Deleted

## 2012-07-22 ENCOUNTER — Ambulatory Visit (INDEPENDENT_AMBULATORY_CARE_PROVIDER_SITE_OTHER): Payer: BC Managed Care – PPO | Admitting: Internal Medicine

## 2012-07-22 ENCOUNTER — Encounter: Payer: Self-pay | Admitting: *Deleted

## 2012-07-22 VITALS — BP 154/76 | HR 45 | Ht 74.0 in | Wt 229.6 lb

## 2012-07-22 DIAGNOSIS — I25810 Atherosclerosis of coronary artery bypass graft(s) without angina pectoris: Secondary | ICD-10-CM

## 2012-07-22 DIAGNOSIS — I4892 Unspecified atrial flutter: Secondary | ICD-10-CM

## 2012-07-22 DIAGNOSIS — I428 Other cardiomyopathies: Secondary | ICD-10-CM

## 2012-07-22 DIAGNOSIS — I429 Cardiomyopathy, unspecified: Secondary | ICD-10-CM | POA: Insufficient documentation

## 2012-07-22 DIAGNOSIS — G4733 Obstructive sleep apnea (adult) (pediatric): Secondary | ICD-10-CM | POA: Insufficient documentation

## 2012-07-22 DIAGNOSIS — I4891 Unspecified atrial fibrillation: Secondary | ICD-10-CM

## 2012-07-22 MED ORDER — RIVAROXABAN 20 MG PO TABS: 20.0000 mg | ORAL_TABLET | Freq: Every day | ORAL | Status: DC

## 2012-07-22 NOTE — Progress Notes (Signed)
Patient Care Team: Etta Grandchild, MD as PCP - General (Internal Medicine)   HPI  Larry Rangel is a 62 y.o. male Seen for atrial flutter  He was found to have cardiomyopathy and underwent cardioversion; TEE at the time of cardioversion had an ejection fraction 35% ; catheterization 2008 had had an ejection fraction of 60%  He has a history of ischemic heart disease with prior bypass surgery 1/09. His had no subsequent catheterization.  Thromboembolic risk factors are notable also for diabetes and hypertension. He was treated with Rivaroxaban in anticipation of cardioversion and consideration of catheter ablation.  Past Medical History  Diagnosis Date  . Anxiety   . Hypertension   . Low back pain   . Tobacco user   . CAD (coronary artery disease) of artery bypass graft   . Hyperlipidemia   . S/P colonoscopy   . Cardiomyopathy     Echo 6/14: Mild LVH, EF 30-35%, diffuse HK, MAC, mild BAE  . Shortness of breath   . Myocardial infarction   . Arthritis     Past Surgical History  Procedure Laterality Date  . Shoulder surgery    . Coronary artery bypass graft  2009    x 5  . Tee without cardioversion N/A 07/15/2012    Procedure: TRANSESOPHAGEAL ECHOCARDIOGRAM (TEE);  Surgeon: Vesta Mixer, MD;  Location[facility] ENDOSCOPY;  Service: Cardiovascular;  Laterality: N/A;  . Cardioversion N/A 07/15/2012    Procedure: CARDIOVERSION;  Surgeon: Vesta Mixer, MD;  Location: [facility] ENDOSCOPY;  Service: Cardiovascular;  Laterality: N/A;    Current Outpatient Prescriptions  Medication Sig Dispense Refill  . amLODipine-valsartan (EXFORGE) 5-320 MG per tablet Take 1 tablet by mouth daily.  90 tablet  3  . busPIRone (BUSPAR) 10 MG tablet TAKE 1 TABLET TWICE A DAY  180 tablet  1  . clonazePAM (KLONOPIN) 0.5 MG tablet TAKE 1 TABLET BY MOUTH TWICE A DAY AS NEEDED  60 tablet  1  . cyclobenzaprine (FLEXERIL) 10 MG tablet TAKE 1 TABLET BY MOUTH 3 TIMES A DAY AS NEEDED  90 tablet  5  . LIPOFEN 150 MG  CAPS TAKE ONE CAPSULE BY MOUTH DAILY  90 capsule  1  . Nebivolol HCl (BYSTOLIC) 20 MG TABS Take 1 tablet (20 mg total) by mouth daily.  90 tablet  3  . Omega-3 Fatty Acids (FISH OIL PO) Take by mouth daily.      Marland Kitchen POTASSIUM PO Take by mouth daily.      . Rivaroxaban (XARELTO) 20 MG TABS Take 1 tablet (20 mg total) by mouth daily.  30 tablet  11  . simvastatin (ZOCOR) 20 MG tablet TAKE 1 TABLET (20 MG TOTAL) BY MOUTH AT BEDTIME.  90 tablet  1   No current facility-administered medications for this visit.    No Known Allergies  Review of Systems negative except from HPI and PMH  Physical Exam BP 154/76  Pulse 45  Ht 6\' 2"  (1.88 m)  Wt 229 lb 9.6 oz (104.146 kg)  BMI 29.47 kg/m2 Well developed and well nourished in no acute distress HENT normal E scleral and icterus clear Neck Supple JVP flat; carotids brisk and full Clear to ausculation C.  Regular rate and rhythm, no murmurs gallops or rub Soft with active bowel sounds No clubbing cyanosis no Edema Alert and oriented, grossly normal motor and sensory function Skin Warm and Dry    Assessment and  Plan

## 2012-07-22 NOTE — Patient Instructions (Addendum)
Your physician has recommended that you have an ablation. Catheter ablation is a medical procedure used to treat some cardiac arrhythmias (irregular heartbeats). During catheter ablation, a long, thin, flexible tube is put into a blood vessel in your groin (upper thigh), or neck. This tube is called an ablation catheter. It is then guided to your heart through the blood vessel. Radio frequency waves destroy small areas of heart tissue where abnormal heartbeats may cause an arrhythmia to start. Please see the instruction sheet given to you today.  Your physician has requested that you have an echocardiogram next week. Echocardiography is a painless test that uses sound waves to create images of your heart. It provides your doctor with information about the size and shape of your heart and how well your heart's chambers and valves are working. This procedure takes approximately one hour. There are no restrictions for this procedure.  Your physician recommends that you return for lab work & an EKG : the day of your echocardiogram

## 2012-07-22 NOTE — Assessment & Plan Note (Signed)
As above.

## 2012-07-22 NOTE — Assessment & Plan Note (Addendum)
The patient has improved symptoms of congestive heart failure following cardioversion. His left ventricular dysfunction which we hope to be rate related. We will plan to undertake catheter ablation of his atrial flutter substrate for the reduction of risk of recurrence which hopefully will translate into reduced risk related to his cardiomyopathy. We will reassess left ventricular function prior to catheter ablation.  Following catheter ablation the event there are no further atrial arrhythmias we would discontinue his anticoagulation.  We have discussed risks of ablation including not limited to death perforation and heart block requiring pacemaker implantation. He and  his wife understand agree and are willing to proceed

## 2012-07-22 NOTE — Assessment & Plan Note (Signed)
He has significant sleep disordered breathing is easy somnolence. Will anticipate a sleep study.

## 2012-07-27 ENCOUNTER — Other Ambulatory Visit: Payer: Self-pay | Admitting: Internal Medicine

## 2012-07-28 ENCOUNTER — Ambulatory Visit (HOSPITAL_COMMUNITY): Payer: BC Managed Care – PPO | Attending: Internal Medicine | Admitting: Radiology

## 2012-07-28 ENCOUNTER — Other Ambulatory Visit (INDEPENDENT_AMBULATORY_CARE_PROVIDER_SITE_OTHER): Payer: BC Managed Care – PPO

## 2012-07-28 ENCOUNTER — Ambulatory Visit (INDEPENDENT_AMBULATORY_CARE_PROVIDER_SITE_OTHER): Payer: BC Managed Care – PPO | Admitting: *Deleted

## 2012-07-28 DIAGNOSIS — I059 Rheumatic mitral valve disease, unspecified: Secondary | ICD-10-CM | POA: Insufficient documentation

## 2012-07-28 DIAGNOSIS — Z8679 Personal history of other diseases of the circulatory system: Secondary | ICD-10-CM

## 2012-07-28 DIAGNOSIS — I4892 Unspecified atrial flutter: Secondary | ICD-10-CM | POA: Insufficient documentation

## 2012-07-28 DIAGNOSIS — I25810 Atherosclerosis of coronary artery bypass graft(s) without angina pectoris: Secondary | ICD-10-CM

## 2012-07-28 DIAGNOSIS — F17200 Nicotine dependence, unspecified, uncomplicated: Secondary | ICD-10-CM | POA: Insufficient documentation

## 2012-07-28 DIAGNOSIS — I10 Essential (primary) hypertension: Secondary | ICD-10-CM | POA: Insufficient documentation

## 2012-07-28 DIAGNOSIS — I429 Cardiomyopathy, unspecified: Secondary | ICD-10-CM

## 2012-07-28 DIAGNOSIS — I4891 Unspecified atrial fibrillation: Secondary | ICD-10-CM | POA: Insufficient documentation

## 2012-07-28 NOTE — Progress Notes (Signed)
Echocardiogram performed.  

## 2012-07-28 NOTE — Progress Notes (Signed)
Patient in for EKG, states he is feeling good. Will have Dr Graciela Husbands review EKG

## 2012-07-29 ENCOUNTER — Encounter (HOSPITAL_COMMUNITY): Payer: Self-pay | Admitting: Pharmacy Technician

## 2012-07-29 LAB — PROTIME-INR: Prothrombin Time: 13.8 s — ABNORMAL HIGH (ref 10.2–12.4)

## 2012-07-29 LAB — BASIC METABOLIC PANEL
CO2: 27 mEq/L (ref 19–32)
Calcium: 10.2 mg/dL (ref 8.4–10.5)
Creatinine, Ser: 1.1 mg/dL (ref 0.4–1.5)
GFR: 71.58 mL/min (ref >60.00)
Sodium: 141 mEq/L (ref 135–145)

## 2012-07-29 LAB — CBC WITH DIFFERENTIAL/PLATELET
Eosinophils Relative: 4.3 % (ref 0.0–5.0)
Hemoglobin: 15.8 g/dL (ref 13.0–17.0)
Lymphs Abs: 3 10*3/uL (ref 0.7–4.0)
MCV: 88.9 fl (ref 78.0–100.0)
Monocytes Absolute: 0.6 10*3/uL (ref 0.1–1.0)
Neutro Abs: 5.9 10*3/uL (ref 1.4–7.7)
Platelets: 171 10*3/uL (ref 150.0–400.0)
RBC: 5.31 Mil/uL (ref 4.22–5.81)

## 2012-07-29 MED ORDER — NEBIVOLOL HCL 20 MG PO TABS: 10.0000 mg | ORAL_TABLET | Freq: Every day | ORAL | Status: DC

## 2012-07-29 NOTE — Progress Notes (Signed)
EKG reviewed by dr Graciela Husbands, pt to decrease bystolic to 10 mg daily. Pt voiced understanding

## 2012-07-29 NOTE — Addendum Note (Signed)
Addended by: Freddi Starr on: 07/29/2012 05:05 PM   Modules accepted: Orders

## 2012-08-04 ENCOUNTER — Other Ambulatory Visit: Payer: Self-pay | Admitting: Internal Medicine

## 2012-08-06 ENCOUNTER — Ambulatory Visit (HOSPITAL_COMMUNITY): Payer: BC Managed Care – PPO | Admitting: Anesthesiology

## 2012-08-06 ENCOUNTER — Encounter (HOSPITAL_COMMUNITY)
Admission: RE | Disposition: A | Discharge status: Home / Self Care | Payer: Self-pay | Source: Ambulatory Visit | Attending: Internal Medicine

## 2012-08-06 ENCOUNTER — Telehealth: Payer: Self-pay | Admitting: Internal Medicine

## 2012-08-06 ENCOUNTER — Ambulatory Visit (HOSPITAL_COMMUNITY)
Admission: RE | Admit: 2012-08-06 | Discharge: 2012-08-07 | Disposition: A | Discharge status: Home / Self Care | Payer: BC Managed Care – PPO | Source: Ambulatory Visit | Attending: Internal Medicine | Admitting: Internal Medicine

## 2012-08-06 ENCOUNTER — Encounter (HOSPITAL_COMMUNITY): Payer: Self-pay | Admitting: Anesthesiology

## 2012-08-06 DIAGNOSIS — I4892 Unspecified atrial flutter: Secondary | ICD-10-CM

## 2012-08-06 DIAGNOSIS — I10 Essential (primary) hypertension: Secondary | ICD-10-CM | POA: Insufficient documentation

## 2012-08-06 DIAGNOSIS — I429 Cardiomyopathy, unspecified: Secondary | ICD-10-CM

## 2012-08-06 DIAGNOSIS — I252 Old myocardial infarction: Secondary | ICD-10-CM | POA: Insufficient documentation

## 2012-08-06 DIAGNOSIS — I25810 Atherosclerosis of coronary artery bypass graft(s) without angina pectoris: Secondary | ICD-10-CM

## 2012-08-06 DIAGNOSIS — I059 Rheumatic mitral valve disease, unspecified: Secondary | ICD-10-CM | POA: Insufficient documentation

## 2012-08-06 HISTORY — PX: ATRIAL FLUTTER ABLATION: SHX5733

## 2012-08-06 LAB — GLUCOSE, CAPILLARY: Glucose-Capillary: 128 mg/dL — ABNORMAL HIGH (ref 70–99)

## 2012-08-06 SURGERY — ATRIAL FLUTTER ABLATION
Anesthesia: Monitor Anesthesia Care

## 2012-08-06 MED ORDER — CYCLOBENZAPRINE HCL 10 MG PO TABS: 10.0000 mg | ORAL_TABLET | Freq: Every day | ORAL | Status: DC | PRN

## 2012-08-06 MED ORDER — IRBESARTAN 300 MG PO TABS
300.0000 mg | ORAL_TABLET | Freq: Every day | ORAL | Status: DC
  Administered 2012-08-07: 10:00:00 300 mg via ORAL
  Filled 2012-08-06: qty 1

## 2012-08-06 MED ORDER — ONDANSETRON HCL 4 MG/2ML IJ SOLN
INTRAMUSCULAR | Status: DC | PRN
  Administered 2012-08-06: 4 mg via INTRAVENOUS

## 2012-08-06 MED ORDER — FENOFIBRATE 160 MG PO TABS
160.0000 mg | ORAL_TABLET | Freq: Every day | ORAL | Status: DC
  Administered 2012-08-07: 160 mg via ORAL
  Filled 2012-08-06: qty 1

## 2012-08-06 MED ORDER — ACETAMINOPHEN 325 MG PO TABS: 650.0000 mg | ORAL_TABLET | ORAL | Status: DC | PRN

## 2012-08-06 MED ORDER — SODIUM CHLORIDE 0.9 % IJ SOLN
3.0000 mL | Freq: Two times a day (BID) | INTRAMUSCULAR | Status: DC
  Administered 2012-08-06: 21:00:00 3 mL via INTRAVENOUS

## 2012-08-06 MED ORDER — AMLODIPINE BESYLATE-VALSARTAN 5-320 MG PO TABS: 1.0000 | ORAL_TABLET | Freq: Every day | ORAL | Status: DC

## 2012-08-06 MED ORDER — MIDAZOLAM HCL 5 MG/5ML IJ SOLN
INTRAMUSCULAR | Status: DC | PRN
  Administered 2012-08-06: 2 mg via INTRAVENOUS

## 2012-08-06 MED ORDER — HYDROMORPHONE HCL PF 2 MG/ML IJ SOLN: 0.2500 mg | INTRAMUSCULAR | Status: DC | PRN

## 2012-08-06 MED ORDER — AMLODIPINE BESYLATE 5 MG PO TABS
5.0000 mg | ORAL_TABLET | Freq: Every day | ORAL | Status: DC
  Administered 2012-08-07: 10:00:00 5 mg via ORAL
  Filled 2012-08-06: qty 1

## 2012-08-06 MED ORDER — ONDANSETRON HCL 4 MG/2ML IJ SOLN: 4.0000 mg | Freq: Four times a day (QID) | INTRAMUSCULAR | Status: DC | PRN

## 2012-08-06 MED ORDER — NEBIVOLOL HCL 20 MG PO TABS: 10.0000 mg | ORAL_TABLET | Freq: Every day | ORAL | Status: DC

## 2012-08-06 MED ORDER — BUPIVACAINE HCL (PF) 0.25 % IJ SOLN
INTRAMUSCULAR | Status: AC
  Filled 2012-08-06: qty 60

## 2012-08-06 MED ORDER — RIVAROXABAN 20 MG PO TABS
20.0000 mg | ORAL_TABLET | Freq: Every day | ORAL | Status: DC
  Administered 2012-08-06: 20 mg via ORAL
  Filled 2012-08-06 (×2): qty 1

## 2012-08-06 MED ORDER — LIDOCAINE HCL (CARDIAC) 20 MG/ML IV SOLN
INTRAVENOUS | Status: DC | PRN
  Administered 2012-08-06: 40 mg via INTRAVENOUS

## 2012-08-06 MED ORDER — CLONAZEPAM 0.5 MG PO TABS: 0.5000 mg | ORAL_TABLET | Freq: Two times a day (BID) | ORAL | Status: DC | PRN

## 2012-08-06 MED ORDER — PROPOFOL 10 MG/ML IV BOLUS
INTRAVENOUS | Status: DC | PRN
  Administered 2012-08-06 (×2): 40 mg via INTRAVENOUS
  Administered 2012-08-06: 160 mg via INTRAVENOUS

## 2012-08-06 MED ORDER — BUSPIRONE HCL 10 MG PO TABS
10.0000 mg | ORAL_TABLET | Freq: Two times a day (BID) | ORAL | Status: DC
  Administered 2012-08-06 – 2012-08-07 (×2): 10 mg via ORAL
  Filled 2012-08-06 (×3): qty 1

## 2012-08-06 MED ORDER — SODIUM CHLORIDE 0.9 % IV SOLN
250.0000 mL | INTRAVENOUS | Status: DC | PRN
Start: 2012-08-06 — End: 2012-08-07

## 2012-08-06 MED ORDER — FENTANYL CITRATE 0.05 MG/ML IJ SOLN
INTRAMUSCULAR | Status: DC | PRN
  Administered 2012-08-06: 100 ug via INTRAVENOUS
  Administered 2012-08-06: 50 ug via INTRAVENOUS

## 2012-08-06 MED ORDER — NEBIVOLOL HCL 10 MG PO TABS
10.0000 mg | ORAL_TABLET | Freq: Every day | ORAL | Status: DC
  Administered 2012-08-07: 10:00:00 10 mg via ORAL
  Filled 2012-08-06: qty 1

## 2012-08-06 MED ORDER — SODIUM CHLORIDE 0.9 % IV SOLN
INTRAVENOUS | Status: DC
  Administered 2012-08-06: 06:00:00 via INTRAVENOUS

## 2012-08-06 MED ORDER — SODIUM CHLORIDE 0.9 % IJ SOLN: 3.0000 mL | INTRAMUSCULAR | Status: DC | PRN

## 2012-08-06 MED ORDER — METOCLOPRAMIDE HCL 5 MG/ML IJ SOLN: 10.0000 mg | Freq: Once | INTRAMUSCULAR | Status: DC | PRN

## 2012-08-06 MED ORDER — LACTATED RINGERS IV SOLN
INTRAVENOUS | Status: DC | PRN
  Administered 2012-08-06: 07:00:00 via INTRAVENOUS

## 2012-08-06 MED ORDER — SODIUM CHLORIDE 0.9 % IV SOLN: INTRAVENOUS | Status: AC

## 2012-08-06 MED ORDER — SIMVASTATIN 20 MG PO TABS
20.0000 mg | ORAL_TABLET | Freq: Every day | ORAL | Status: DC
  Filled 2012-08-06: qty 1

## 2012-08-06 NOTE — Transfer of Care (Signed)
Immediate Anesthesia Transfer of Care Note  Patient: Larry Rangel  Procedure(s) Performed: Procedure(s): ATRIAL FLUTTER ABLATION (N/A)  Patient Location: PACU  Anesthesia Type:General  Level of Consciousness: awake, alert  and oriented  Airway & Oxygen Therapy: Patient Spontanous Breathing and Patient connected to nasal cannula oxygen  Post-op Assessment: Report given to PACU RN, Post -op Vital signs reviewed and stable and Patient moving all extremities  Post vital signs: Reviewed and stable  Complications: No apparent anesthesia complications

## 2012-08-06 NOTE — H&P (View-Only) (Signed)
Patient Care Team: Thomas L Jones, MD as PCP - General (Internal Medicine)   HPI  Larry Rangel is a 62 y.o. male Seen for atrial flutter  He was found to have cardiomyopathy and underwent cardioversion; TEE at the time of cardioversion had an ejection fraction 35% ; catheterization 2008 had had an ejection fraction of 60%  He has a history of ischemic heart disease with prior bypass surgery 1/09. His had no subsequent catheterization.  Thromboembolic risk factors are notable also for diabetes and hypertension. He was treated with Rivaroxaban in anticipation of cardioversion and consideration of catheter ablation.  Past Medical History  Diagnosis Date  . Anxiety   . Hypertension   . Low back pain   . Tobacco user   . CAD (coronary artery disease) of artery bypass graft   . Hyperlipidemia   . S/P colonoscopy   . Cardiomyopathy     Echo 6/14: Mild LVH, EF 30-35%, diffuse HK, MAC, mild BAE  . Shortness of breath   . Myocardial infarction   . Arthritis     Past Surgical History  Procedure Laterality Date  . Shoulder surgery    . Coronary artery bypass graft  2009    x 5  . Tee without cardioversion N/A 07/15/2012    Procedure: TRANSESOPHAGEAL ECHOCARDIOGRAM (TEE);  Surgeon: Philip J Nahser, MD;  Location: [facility] ENDOSCOPY;  Service: Cardiovascular;  Laterality: N/A;  . Cardioversion N/A 07/15/2012    Procedure: CARDIOVERSION;  Surgeon: Philip J Nahser, MD;  Location: [facility] ENDOSCOPY;  Service: Cardiovascular;  Laterality: N/A;    Current Outpatient Prescriptions  Medication Sig Dispense Refill  . amLODipine-valsartan (EXFORGE) 5-320 MG per tablet Take 1 tablet by mouth daily.  90 tablet  3  . busPIRone (BUSPAR) 10 MG tablet TAKE 1 TABLET TWICE A DAY  180 tablet  1  . clonazePAM (KLONOPIN) 0.5 MG tablet TAKE 1 TABLET BY MOUTH TWICE A DAY AS NEEDED  60 tablet  1  . cyclobenzaprine (FLEXERIL) 10 MG tablet TAKE 1 TABLET BY MOUTH 3 TIMES A DAY AS NEEDED  90 tablet  5  . LIPOFEN 150 MG  CAPS TAKE ONE CAPSULE BY MOUTH DAILY  90 capsule  1  . Nebivolol HCl (BYSTOLIC) 20 MG TABS Take 1 tablet (20 mg total) by mouth daily.  90 tablet  3  . Omega-3 Fatty Acids (FISH OIL PO) Take by mouth daily.      . POTASSIUM PO Take by mouth daily.      . Rivaroxaban (XARELTO) 20 MG TABS Take 1 tablet (20 mg total) by mouth daily.  30 tablet  11  . simvastatin (ZOCOR) 20 MG tablet TAKE 1 TABLET (20 MG TOTAL) BY MOUTH AT BEDTIME.  90 tablet  1   No current facility-administered medications for this visit.    No Known Allergies  Review of Systems negative except from HPI and PMH  Physical Exam BP 154/76  Pulse 45  Ht 6' 2" (1.88 m)  Wt 229 lb 9.6 oz (104.146 kg)  BMI 29.47 kg/m2 Well developed and well nourished in no acute distress HENT normal E scleral and icterus clear Neck Supple JVP flat; carotids brisk and full Clear to ausculation C.  Regular rate and rhythm, no murmurs gallops or rub Soft with active bowel sounds No clubbing cyanosis no Edema Alert and oriented, grossly normal motor and sensory function Skin Warm and Dry    Assessment and  Plan   

## 2012-08-06 NOTE — CV Procedure (Signed)
DEARIS DANIS [id number]  [id number]  Preop YQ:[id number] flutter Postop Dx same/   Procedure:EPS, LA mapping and catheter  Cx: None   Dictation number [id number]  Sherryl Manges, MD 08/06/2012 9:06 AM

## 2012-08-06 NOTE — Progress Notes (Signed)
Pt agreeable to stay the night Will need sleep study as outpt Continue Rivaroxaban until gone ( about 10 days)  Wean off bystolic  F/u sk 6 weeks

## 2012-08-06 NOTE — Op Note (Signed)
NAMEEUGUNE, SINE NO.:  [id number]  MEDICAL RECORD NO.:  [id number]  LOCATION:  [facility]                         FACILITY:  [facility]  PHYSICIAN:  Duke Salvia, MD, FACCDATE OF BIRTH:  20-Dec-1950  DATE OF PROCEDURE:  08/06/2012 DATE OF DISCHARGE:                              OPERATIVE REPORT   PREOPERATIVE DIAGNOSIS:  Atrial flutter.  POSTOPERATIVE DIAGNOSIS:  Atrial flutter.  PROCEDURE:  Electrophysiological study with left atrial mapping and RF catheter ablation.  DESCRIPTION OF THE PROCEDURE:  Following obtaining informed consent, the patient was brought to electrophysiology laboratory and placed on the fluoroscopic table in supine position.  He was admitted to general anesthesia under the care of Dr. Noreene Larsson.  After routine prep and drape, cardiac catheterization was performed with support of local anesthesia.  Catheters of 5-French quadripolar catheter was inserted in left femoral vein to the AV junction.  A 6-French quadripolar catheter was inserted the right femoral vein to the coronary sinus.  A 7-French dual decapolar catheter was inserted via the left femoral vein to the tricuspid anulus.  An 8-French 10 mm deflectable tip ablation catheter was inserted in the right femoral vein to mapping sites in the posterior septal space.  Surface leads I, aVF, and V1 were monitored continuously throughout the procedure.  Following insertion of the catheters, a stimulation protocol included incremental atrial pacing.  Incremental ventricular pacing.  Single atrial extrastimuli at paced cycle length of 600 milliseconds.  RESULTS:  Rhythm and basic intervals. Initial:  Rhythm:  Sinus; RR interval 1249 milliseconds; PR interval 191 milliseconds; P wave duration 102 milliseconds; QRS duration 99 milliseconds; QTc interval 453 milliseconds.  AH interval 121 milliseconds; HV interval 59 milliseconds.  Final:  Rhythm:  Sinus; RR interval 4067 milliseconds;  P wave duration 122 milliseconds; PR interval 201 milliseconds; QRS duration 99 milliseconds; QT interval 457 milliseconds.  AH interval:  120 milliseconds.  HV interval 59 milliseconds.  AV nodal function. AV Wenckebach 460 milliseconds.  VA conduction was dissociated at 600 milliseconds. AV nodal effective refractory period at 600 milliseconds was 380 milliseconds and there was no evidence of dual AV nodal physiology.  Accessory pathway function:  No evidence of accessory pathway was identified.  Ventricular response to programmed stimulation was normal for ventricular stimulation as described.  Radiofrequency energy, a total of 3 minutes and 56 seconds of RF energy was applied across the cavotricuspid isthmus with the subsequent development of bidirectional cavotricuspid isthmus conduction block. The A1 and A2 interval was 125 milliseconds; the S1 and A2 interval was 205 milliseconds.  Fluoroscopy time, a total of 7.3 minutes of fluoroscopy time was utilized at 7.5 frames per second.  IMPRESSION: 1. Sinus bradycardia. 2. Abnormal atrial function manifested by sustained atrial flutter     with successful cavotricuspid isthmus ablation. 3. Normal AV nodal function. 4. Normal His-Purkinje system function apart from mild prolongation of     the HV interval. 5. No accessory pathway. 6. Normal ventricular response programmed stimulation.  Summary and conclusion, the results of electrophysiological testing confirmed cavotricuspid isthmus conduction and we were able to demonstrate bidirectional cavotricuspid isthmus conduction block based on A1 and A2 interval.  The patient's sinus bradycardia is  notable.  We will plan to wean his Bystolic.  We will plan to discontinue his anticoagulation.  We will observe him 8 to 12 hours.     Duke Salvia, MD, [facility]     SCK/MEDQ  D:  08/06/2012  T:  08/06/2012  Job:  [id number]

## 2012-08-06 NOTE — Interval H&P Note (Signed)
History and Physical Interval Note:  08/06/2012 7:17 AM  Larry Rangel  has presented today for surgery, with the diagnosis of Aflutter  The various methods of treatment have been discussed with the patient and family. After consideration of risks, benefits and other options for treatment, the patient has consented to  Procedure(s): ATRIAL FLUTTER ABLATION (N/A) as a surgical intervention .  The patient's history has been reviewed, patient examined, no change in status, stable for surgery.  I have reviewed the patient's chart and labs.  Questions were answered to the patient's satisfaction.     Sherryl Manges

## 2012-08-06 NOTE — Anesthesia Procedure Notes (Signed)
Procedure Name: LMA Insertion Date/Time: 08/06/2012 7:42 AM Performed by: Coralee Rud Pre-anesthesia Checklist: Patient identified, Emergency Drugs available, Suction available and Patient being monitored Patient Re-evaluated:Patient Re-evaluated prior to inductionOxygen Delivery Method: Circle system utilized Preoxygenation: Pre-oxygenation with 100% oxygen Intubation Type: IV induction Ventilation: Mask ventilation without difficulty LMA: LMA inserted LMA Size: 5.0 Number of attempts: 1 Tube secured with: Tape Dental Injury: Teeth and Oropharynx as per pre-operative assessment

## 2012-08-06 NOTE — Anesthesia Postprocedure Evaluation (Signed)
  Anesthesia Post-op Note  Patient: Larry Rangel  Procedure(s) Performed: Procedure(s): ATRIAL FLUTTER ABLATION (N/A)  Patient Location: Cath Lab  Anesthesia Type:General  Level of Consciousness: awake, alert  and oriented  Airway and Oxygen Therapy: Patient Spontanous Breathing  Post-op Pain: none  Post-op Assessment: Post-op Vital signs reviewed, Patient's Cardiovascular Status Stable, Respiratory Function Stable, Patent Airway, No signs of Nausea or vomiting and Pain level controlled  Post-op Vital Signs: stable  Complications: No apparent anesthesia complications

## 2012-08-06 NOTE — Anesthesia Preprocedure Evaluation (Addendum)
Anesthesia Evaluation  Patient identified by MRN, date of birth, ID band Patient awake    Reviewed: Allergy & Precautions, H&P , NPO status , Patient's Chart, lab work & pertinent test results, reviewed documented beta blocker date and time   Airway Mallampati: II      Dental  (+) Teeth Intact and Dental Advisory Given   Pulmonary sleep apnea ,  Patient's wife made observation but no formal dx. breath sounds clear to auscultation  Pulmonary exam normal       Cardiovascular hypertension, Pt. on home beta blockers + CAD and + Past MI + dysrhythmias Atrial Fibrillation Rhythm:Irregular Rate:Abnormal     Neuro/Psych negative neurological ROS  negative psych ROS   GI/Hepatic negative GI ROS, Neg liver ROS,   Endo/Other    Renal/GU negative Renal ROS     Musculoskeletal negative musculoskeletal ROS (+)   Abdominal   Peds  Hematology negative hematology ROS (+)   Anesthesia Other Findings   Reproductive/Obstetrics                          Anesthesia Physical Anesthesia Plan  ASA: III  Anesthesia Plan: General   Post-op Pain Management:    Induction: Intravenous  Airway Management Planned: LMA  Additional Equipment:   Intra-op Plan:   Post-operative Plan: Extubation in OR  Informed Consent: I have reviewed the patients History and Physical, chart, labs and discussed the procedure including the risks, benefits and alternatives for the proposed anesthesia with the patient or authorized representative who has indicated his/her understanding and acceptance.   Dental advisory given  Plan Discussed with: CRNA and Anesthesiologist  Anesthesia Plan Comments: (Paroxysmal Aflutter CAD, S/P CABG 2009 EF 40% Htn  Plan GA with LMA  Kipp Brood, MD)       Anesthesia Quick Evaluation

## 2012-08-07 DIAGNOSIS — I428 Other cardiomyopathies: Secondary | ICD-10-CM

## 2012-08-07 DIAGNOSIS — I25810 Atherosclerosis of coronary artery bypass graft(s) without angina pectoris: Secondary | ICD-10-CM

## 2012-08-07 DIAGNOSIS — I4892 Unspecified atrial flutter: Secondary | ICD-10-CM

## 2012-08-07 MED ORDER — ASPIRIN EC 81 MG PO TBEC: 81.0000 mg | DELAYED_RELEASE_TABLET | Freq: Every day | ORAL | Status: DC

## 2012-08-07 NOTE — Discharge Summary (Signed)
Please see my rounding note as well.

## 2012-08-07 NOTE — Discharge Summary (Addendum)
Physician Discharge Summary  Patient ID: Larry Rangel MRN: [id number] DOB/AGE: 09/22/1950 62 y.o.  Admit date: 08/06/2012 Discharge date: 08/07/2012  Primary Cardiologist: Juanito Doom, MD Primary Electrophysiologist: Sherryl Manges, MD   Primary Discharge Diagnosis:  1 Atrial Flutter  - Status post successful RF ablation 2 Bradycardia   - Plan for weaning Bystolic, and consideration for outpatient formal sleep study 3 Cardiomyopathy  - EF 40%, by echocardiography  - incidental R-L atrial shunt with provocative maneuvers on TEE (not noted on resting study)   Secondary Discharge Diagnoses: Past Medical History  Diagnosis Date  . Anxiety   . Hypertension   . Low back pain   . Tobacco user   . CAD (coronary artery disease) of artery bypass graft   . Hyperlipidemia   . S/P colonoscopy   . Cardiomyopathy     Echo 6/14: Mild LVH, EF 30-35%, diffuse HK, MAC, mild BAE  . Shortness of breath   . Myocardial infarction   . Arthritis   . Atrial flutter 07-2012    s/p ablation by Dr Graciela Husbands 08-06-2012     Procedures: 2D Echocardiogram, 07/28/2012: Study Conclusions  - Left ventricle: The cavity size was mildly dilated. Wall thickness was normal. The estimated ejection fraction was 40%. Diffuse hypokinesis. Features are consistent with a pseudonormal left ventricular filling pattern, with concomitant abnormal relaxation and increased filling pressure (grade 2 diastolic dysfunction). - Aortic valve: There was no stenosis. - Mitral valve: Mildly calcified annulus. Trivial regurgitation. - Left atrium: The atrium was mildly dilated. - Right ventricle: The cavity size was normal. Systolic function was mildly reduced. - Right atrium: The atrium was mildly dilated. - Pulmonary arteries: No complete TR doppler jet so unable to estimate PA systolic pressure. - Systemic veins: IVC measured 2.4 cm with some respirophasic variation, suggesting RA pressure 10 mmHg. Impressions:  -  Mildly dilated LV. Diffuse hypokinesis with EF 40%. Moderate diastolic dysfunction. Normal RV size with mildly decreased systolic function. No significant valvular abnormalities.    Hospital Course: Patient underwent successful same day elective radiofrequency ablation of  documented atrial flutter, by Dr. Sherryl Manges, with no noted complications. Dr. Graciela Husbands did, however, document notable sinus bradycardia, and reduced Bystolic dose by half, with recommendation to wean this medication altogether. He also recommended discontinuation of his anticoagulation. Given his history of CAD, patient was placed back on ASA 81 mg daily, at time of discharge.  Patient was deemed stable for discharge the following morning, reporting no symptoms despite noted bradycardia. Bystolic was already cut in half to 10 mg daily. Plan was to continue remaining medical regimen.  Discussion regarding outpatient sleep study to take place at time of scheduled followup, as well as determining if patient needs further medication adjustments, as it relates to his bradycardia and cardiomyopathy.   Discharge Vitals: Blood pressure 158/71, pulse 47, temperature 98.4 F (36.9 C), temperature source Oral, resp. rate 18, height 6\' 2"  (1.88 m), weight 229 lb (103.874 kg), SpO2 96.00%.  Labs: Lab Results  Component Value Date   WBC 10.1 07/28/2012   HGB 15.8 07/28/2012   HCT 47.3 07/28/2012   MCV 88.9 07/28/2012   PLT 171.0 07/28/2012     No results found for this basename: NA, K, CL, CO2, BUN, CREATININE, CALCIUM, ALBUMIN, PROT, BILITOT, ALKPHOS, ALT, AST, GLUCOSE,  in the last 168 hours  Lab Results  Component Value Date   CHOL 113 04/16/2012   HDL 22.80* 04/16/2012   LDLCALC 59 04/16/2012   TRIG 157.0*  04/16/2012    No results found for this basename: DDIMER    Lab Results  Component Value Date   TSH 0.38 04/16/2012    No results found for this basename: CKTOTAL, CKMB, TROPONINI,  in the last 72 hours  Diagnostic  Studies: No results found.   DISPOSITION: Stable condition  FOLLOW UP PLANS AND APPOINTMENTS: Discharge Orders   Future Appointments Provider Department Dept Phone   09/07/2012 9:30 AM Minda Meo, PA-C [facility] Main Office [place]) [phone]   Future Orders Complete By Expires     Diet - low sodium heart healthy  As directed     Increase activity slowly  As directed           Follow-up Information   Follow up with Sherryl Manges, MD In 2 weeks.   Contact information:   [address] [place] [address] [phone]       DISCHARGE MEDICATIONS:   Medication List    STOP taking these medications       XARELTO 20 MG Tabs  Generic drug:  Rivaroxaban      TAKE these medications       amLODipine-valsartan 5-320 MG per tablet  Commonly known as:  EXFORGE  Take 1 tablet by mouth daily.     aspirin EC 81 MG tablet  Take 1 tablet (81 mg total) by mouth daily.     busPIRone 10 MG tablet  Commonly known as:  BUSPAR  Take 10 mg by mouth 2 (two) times daily.     clonazePAM 0.5 MG tablet  Commonly known as:  KLONOPIN  Take 0.5 mg by mouth 2 (two) times daily as needed (stress).     cyclobenzaprine 10 MG tablet  Commonly known as:  FLEXERIL  Take 10 mg by mouth 5 (five) times daily as needed for muscle spasms.     FISH OIL PO  Take 1 capsule by mouth daily.     LIPOFEN 150 MG Caps  Generic drug:  Fenofibrate  Take 150 mg by mouth daily.     Nebivolol HCl 20 MG Tabs  Commonly known as:  BYSTOLIC  Take 0.5 tablets (10 mg total) by mouth daily.     POTASSIUM PO  Take 1 tablet by mouth daily. otc - pt does not know strength     simvastatin 20 MG tablet  Commonly known as:  ZOCOR  Take 20 mg by mouth daily.        BRING ALL MEDICATIONS WITH YOU TO FOLLOW UP APPOINTMENTS  Time spent with patient to include physician time: Greater than 30 minutes, including physician time.  SignedPrescott Parma 08/07/2012, 9:13  AM Co-Sign MD

## 2012-08-07 NOTE — Progress Notes (Signed)
   Primary cardiologist: Dr. Valera Castle Primary electrophysiologist: Dr. Sherryl Manges  Subjective:   Feels well this morning, no palpitations, no dizziness.   Objective:   Temp:  [97.6 F (36.4 C)-98.7 F (37.1 C)] 98.4 F (36.9 C) (07/26 0758) Pulse Rate:  [47-72] 47 (07/26 0758) Resp:  [18] 18 (07/26 0758) BP: (113-185)/(63-94) 158/71 mmHg (07/26 0758) SpO2:  [95 %-98 %] 96 % (07/26 0758)    Filed Weights   08/06/12 0551  Weight: 229 lb (103.874 kg)    Intake/Output Summary (Last 24 hours) at 08/07/12 0820 Last data filed at 08/07/12 0752  Gross per 24 hour  Intake   1480 ml  Output      0 ml  Net   1480 ml   Telemetry: Sinus bradycardia, heart rate down in the low 40s at times, no pauses, occasional PVCs.  Exam:  General: Comfortable at rest.  Lungs: Clear, nonlabored.  Cardiac: RRR, no gallop.  Extremities: No edema.  Echocardiogram: Study Conclusions  - Left ventricle: The cavity size was mildly dilated. Wall thickness was normal. The estimated ejection fraction was 40%. Diffuse hypokinesis. Features are consistent with a pseudonormal left ventricular filling pattern, with concomitant abnormal relaxation and increased filling pressure (grade 2 diastolic dysfunction). - Aortic valve: There was no stenosis. - Mitral valve: Mildly calcified annulus. Trivial regurgitation. - Left atrium: The atrium was mildly dilated. - Right ventricle: The cavity size was normal. Systolic function was mildly reduced. - Right atrium: The atrium was mildly dilated. - Pulmonary arteries: No complete TR doppler jet so unable to estimate PA systolic pressure. - Systemic veins: IVC measured 2.4 cm with some respirophasic variation, suggesting RA pressure 10 mmHg. Impressions:  - Mildly dilated LV. Diffuse hypokinesis with EF 40%. Moderate diastolic dysfunction. Normal RV size with mildly decreased systolic function. No significant valvular abnormalities.   ECG:  Sinus bradycardia with IVCD and nonspecific ST changes.   Medications:   Scheduled Medications: . amLODipine  5 mg Oral Daily   And  . irbesartan  300 mg Oral Daily  . busPIRone  10 mg Oral BID  . fenofibrate  160 mg Oral Daily  . nebivolol  10 mg Oral Daily  . Rivaroxaban  20 mg Oral Q supper  . simvastatin  20 mg Oral q1800  . sodium chloride  3 mL Intravenous Q12H      PRN Medications:  sodium chloride, acetaminophen, clonazePAM, cyclobenzaprine, ondansetron (ZOFRAN) IV, sodium chloride   Assessment:   1. Atrial flutter status post RFA by Dr. Graciela Husbands on 7/25.  2. Cardiomyopathy, LVEF approximately 40% by transthoracic echocardiogram.  3. Bradycardia. Plan is to wean Bystolic and also consider outpatient sleep study.  4. CAD status post CABG in 2009.  5. Incidentally noted right to left atrial shunt with provocative maneuvers on TEE, not noted on resting study.  6. Hypertension.  7. Hyperlipidemia, on statin therapy.  Plan/Discussion:    Patient ready for discharge today. States that he feels well despite bradycardia. Bystolic was already cut in half to 10 mg daily. Continue other medical regimen. He will need to have an office visit within 2 weeks with Dr. Graciela Husbands or Mr. Alben Spittle PA-C. Discussion regarding outpatient sleep study can be had at that time, and also determination if the patient needs further medication adjustments as it relates to bradycardia and his cardiomyopathy.   Jonelle Sidle, M.D., F.A.C.C.

## 2012-08-09 ENCOUNTER — Other Ambulatory Visit: Payer: Self-pay | Admitting: *Deleted

## 2012-08-09 DIAGNOSIS — G4730 Sleep apnea, unspecified: Secondary | ICD-10-CM

## 2012-08-10 ENCOUNTER — Telehealth: Payer: Self-pay | Admitting: Internal Medicine

## 2012-08-10 NOTE — Telephone Encounter (Signed)
Informed pt's wife of appt 8/26 8pm     ob

## 2012-08-11 NOTE — Discharge Summary (Signed)
See also my rounding note. 

## 2012-08-30 ENCOUNTER — Other Ambulatory Visit: Payer: Self-pay | Admitting: Internal Medicine

## 2012-09-01 ENCOUNTER — Other Ambulatory Visit: Payer: Self-pay | Admitting: Internal Medicine

## 2012-09-07 ENCOUNTER — Encounter: Payer: Self-pay | Admitting: Cardiology

## 2012-09-07 ENCOUNTER — Encounter (HOSPITAL_BASED_OUTPATIENT_CLINIC_OR_DEPARTMENT_OTHER): Payer: BC Managed Care – PPO

## 2012-09-07 ENCOUNTER — Ambulatory Visit (INDEPENDENT_AMBULATORY_CARE_PROVIDER_SITE_OTHER): Payer: BC Managed Care – PPO | Admitting: Cardiology

## 2012-09-07 VITALS — BP 158/88 | HR 58 | Ht 74.0 in | Wt 225.8 lb

## 2012-09-07 DIAGNOSIS — I4892 Unspecified atrial flutter: Secondary | ICD-10-CM

## 2012-09-07 DIAGNOSIS — R001 Bradycardia, unspecified: Secondary | ICD-10-CM

## 2012-09-07 DIAGNOSIS — Z8679 Personal history of other diseases of the circulatory system: Secondary | ICD-10-CM

## 2012-09-07 DIAGNOSIS — I498 Other specified cardiac arrhythmias: Secondary | ICD-10-CM

## 2012-09-07 DIAGNOSIS — Z9889 Other specified postprocedural states: Secondary | ICD-10-CM

## 2012-09-07 DIAGNOSIS — I10 Essential (primary) hypertension: Secondary | ICD-10-CM

## 2012-09-07 DIAGNOSIS — I519 Heart disease, unspecified: Secondary | ICD-10-CM

## 2012-09-07 NOTE — Patient Instructions (Signed)
Your physician wants you to follow-up in: 6 MONTHS WITH DR. Logan Bores will receive a reminder letter in the mail two months in advance. If you don't receive a letter, please call our office to schedule the follow-up appointment.  CALL OFFICE IN 1 WEEK WITH BLOOD PRESSURE READINGS AND WILL ADJUST MEDICATIONS IF NEED AFTER READINGS  Your physician recommends that you continue on your current medications as directed. Please refer to the Current Medication list given to you today.

## 2012-09-07 NOTE — Progress Notes (Signed)
ELECTROPHYSIOLOGY OFFICE NOTE  Patient ID: Larry Rangel MRN: [id number], DOB/AGE: December 17, 1950   Date of Visit: 09/07/2012  Primary Physician: Sanda Linger, MD Primary Cardiologist / EP: Wall, MD / Graciela Husbands, MD Reason for Visit: Hospital follow-up  History of Present Illness  Larry Rangel is a 62 y.o. male with CAD s/p CABG 2009, HTN and DM who was recently found to have atrial flutter and LV dysfunction, EF 35%, in June 2014. He was started on Xarelto and underwent TEE-guided DCCV on 07/15/2012. He was then evaluated by Dr. Graciela Husbands and elected to proceed with definitive treatment using catheter ablation. On 08/06/2012 he underwent EPS +RF ablation of atrial flutter. He was found to have sinus bradycardia with resting rates in the 40s. His Bystolic dose was decreased. Anticoagulation was discontinued at discharge.   He presents today for hospital followup. Since discharge, he reports he is doing well and has no complaints. He denies chest pain or shortness of breath. He denies palpitations, dizziness, near syncope or syncope. He denies LE swelling, orthopnea, PND or recent weight gain. He is compliant and tolerating medications without difficulty.  Past Medical History Past Medical History  Diagnosis Date  . Anxiety   . Hypertension   . Low back pain   . Tobacco user   . CAD (coronary artery disease) of artery bypass graft   . Hyperlipidemia   . S/P colonoscopy   . Cardiomyopathy     Echo 6/14: Mild LVH, EF 30-35%, diffuse HK, MAC, mild BAE  . Shortness of breath   . Myocardial infarction   . Arthritis   . Atrial flutter 07-2012    s/p ablation by Dr Graciela Husbands 08-06-2012    Past Surgical History Past Surgical History  Procedure Laterality Date  . Shoulder surgery    . Coronary artery bypass graft  2009    x 5  . Tee without cardioversion N/A 07/15/2012    Procedure: TRANSESOPHAGEAL ECHOCARDIOGRAM (TEE);  Surgeon: Vesta Mixer, MD;  Location: [facility] ENDOSCOPY;  Service:  Cardiovascular;  Laterality: N/A;  . Cardioversion N/A 07/15/2012    Procedure: CARDIOVERSION;  Surgeon: Vesta Mixer, MD;  Location[facility] ENDOSCOPY;  Service: Cardiovascular;  Laterality: N/A;  . Ablation of dysrhythmic focus  08/06/2012    CTI ablation by Dr Graciela Husbands    Allergies/Intolerances No Known Allergies  Current Home Medications Current Outpatient Prescriptions  Medication Sig Dispense Refill  . amLODipine-valsartan (EXFORGE) 5-320 MG per tablet Take 1 tablet by mouth daily.      Marland Kitchen aspirin EC 81 MG tablet Take 1 tablet (81 mg total) by mouth daily.      . busPIRone (BUSPAR) 10 MG tablet TAKE 1 TABLET TWICE A DAY  180 tablet  1  . clonazePAM (KLONOPIN) 0.5 MG tablet TAKE 1 TABLET BY MOUTH TWICE A DAY AS NEEDED  60 tablet  1  . cyclobenzaprine (FLEXERIL) 10 MG tablet Take 10 mg by mouth 5 (five) times daily as needed for muscle spasms.      . Fenofibrate (LIPOFEN) 150 MG CAPS Take 150 mg by mouth daily.      . Nebivolol HCl (BYSTOLIC) 20 MG TABS Take 0.5 tablets (10 mg total) by mouth daily.  90 tablet  3  . Omega-3 Fatty Acids (FISH OIL PO) Take 1 capsule by mouth daily.       Marland Kitchen POTASSIUM PO Take 1 tablet by mouth daily. otc - pt does not know strength      . simvastatin (ZOCOR) 20  MG tablet Take 20 mg by mouth daily.       No current facility-administered medications for this visit.   Social History History   Social History  . Marital Status: Married    Spouse Name: N/A    Number of Children: N/A  . Years of Education: N/A   Occupational History  . Not on file.   Social History Main Topics  . Smoking status: Former Smoker -- 1.00 packs/day for 40 years    Types: Cigarettes    Quit date: 02/01/2011  . Smokeless tobacco: Never Used  . Alcohol Use: 14.4 oz/week    24 Glasses of wine per week     Comment: drinks 2 bottles of wine per week  . Drug Use: No  . Sexual Activity: Yes   Other Topics Concern  . 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   He has been married four times previously, the first  For 26yrs and has two children by the marriage which are grown, the second time for 11 yrs and has 2 by that marriage.   Wife has custody in [place].   The third marriage was 4 1/2 yrs and most recent marriage was the past eight weeks and he is currently estranged from that person.    Review of Systems General: No chills, fever, night sweats or weight changes Cardiovascular: No chest pain, dyspnea on exertion, edema, orthopnea, palpitations, paroxysmal nocturnal dyspnea Dermatological: No rash, lesions or masses Respiratory: No cough, dyspnea Urologic: No hematuria, dysuria Abdominal: No nausea, vomiting, diarrhea, bright red blood per rectum, melena, or hematemesis Neurologic: No visual changes, weakness, changes in mental status All other systems reviewed and are otherwise negative except as noted above.  Physical Exam Vitals: Blood pressure 158/88, pulse 58, height 6\' 2"  (1.88 m), weight 225 lb 12.8 oz (102.422 kg).  General: Well developed, well appearing 62 y.o. male in no acute distress. HEENT: Normocephalic, atraumatic. EOMs intact. Sclera nonicteric. Oropharynx clear.  Neck: Supple. No JVD. Lungs: Respirations regular and unlabored, CTA bilaterally. No wheezes, rales or rhonchi. Heart: RRR. S1, S2 present. No murmurs, rub, S3 or S4. Abdomen: Soft, non-distended.  Extremities: No clubbing, cyanosis or edema. PT/Radials 2+ and equal bilaterally. Groin sites / access sites well-healed. Psych: Normal affect. Neuro: Alert and oriented X 3. Moves all extremities spontaneously.  Diagnostics 2D echocardiogram 07/28/2012 Study Conclusions - Left ventricle: The cavity size was mildly dilated. Wall thickness was normal. The estimated ejection fraction was 40%. Diffuse hypokinesis. Features are consistent with  a pseudonormal left ventricular filling pattern, with concomitant abnormal relaxation and increased filling pressure (grade 2 diastolic dysfunction). - Aortic valve: There was no stenosis. - Mitral valve: Mildly calcified annulus. Trivial regurgitation. - Left atrium: The atrium was mildly dilated. - Right ventricle: The cavity size was normal. Systolic function was mildly reduced. - Right atrium: The atrium was mildly dilated. - Pulmonary arteries: No complete TR doppler jet so unable to estimate PA systolic pressure. - Systemic veins: IVC measured 2.4 cm with some respirophasic variation, suggesting RA pressure 10 mmHg. Impression: - Mildly dilated LV. Diffuse hypokinesis with EF 40%. Moderate diastolic dysfunction. Normal RV size with mildly decreased systolic function. No significant valvular abnormalities.  Assessment and Plan 1. Atrial flutter s/p recent EPS +RF ablation - doing well post ablation without symptom or documented recurrence -  follow-up with Dr. Graciela Husbands in 6 months (In Dr. Vern Claude absence, Mr. Caiazzo prefers to continue f/u with Dr. Graciela Husbands) 2. Sinus bradycardia - asymptomatic - improved on lower dose of Bystolic 3. HTN - BP elevated today - Mr. Dingley states he has dizziness if systolic BP less than 130 - goal BP <135/85 given his history of CAD and DM - he agreed to keep BP log at home for the next week and will call us with the results; if BP consistently >135/85, will need medication adjustment 4. LV dysfunction, EF 40% by most recent echo - will need repeat echo in 4-6 weeks   Signed, Demari Gales, PA-C 09/07/2012, 10:34 AM

## 2012-09-23 ENCOUNTER — Telehealth: Payer: Self-pay | Admitting: Cardiology

## 2012-09-23 NOTE — Telephone Encounter (Signed)
Patient's BP log for this week:  Friday 142/77 Sunday 140/72 Monday 143/74 Tuesday 142/63 Saturday 143/62.  Routed to [employer], [place]

## 2012-09-23 NOTE — Telephone Encounter (Signed)
F/up   Pt was advised to call in readings and they are:   Friday 142/77 Sunday 140/72 Monday 143/74 Tuesday 142/63 Saturday 143/62

## 2012-09-24 NOTE — Telephone Encounter (Signed)
Of note, he does not tolerate higher doses of Bystolic due to bradycardia.

## 2012-09-24 NOTE — Telephone Encounter (Signed)
We can increase the amlodipine dose in Exforge which he already takes as 5/320 mg once daily. Please send a new prescription for Exforge 10/320 mg once daily to his pharmacy (note the only change is the amlodipine portion of this combo med). Larry Rangel stated that he experiences dizziness when his BP is too low (stated when SBP <130), as I mentioned in my note. So if he has any trouble / side effects please tell him to call us right away. Please instruct him to continue his BP log for one additional week to document that his BP has improved and he will need to see Dr. Graciela Husbands in 6 weeks. Thank you. -BE

## 2012-10-06 NOTE — Telephone Encounter (Signed)
Unable to reach patient. This still needs adressed - see Rick Duff, PA, note below. Thanks! Routed to Triage for 10/07/12.

## 2012-10-06 NOTE — Telephone Encounter (Signed)
LMTCB 3:55pm/PE

## 2012-10-06 NOTE — Telephone Encounter (Signed)
LMTCB 8:30am/PE

## 2012-10-07 NOTE — Telephone Encounter (Signed)
LMTCB ./CY 

## 2012-10-08 ENCOUNTER — Other Ambulatory Visit: Payer: Self-pay | Admitting: *Deleted

## 2012-10-08 MED ORDER — AMLODIPINE BESYLATE-VALSARTAN 10-320 MG PO TABS: 1.0000 | ORAL_TABLET | Freq: Every day | ORAL | Status: DC

## 2012-10-08 NOTE — Telephone Encounter (Signed)
Spoke with patients wife (patient not there again) - explained Larry Rangel's orders to increase medication. Order put into system and sent to their pharmacy. I advised them to have him keep track of blood pressures and call it in next Friday. I could not schedule future appointment because patient's wife stated that they would let us know when they called BPs in next Friday. (she stated that he needed to get his schedule first). I will follow up with them if no appointment is made

## 2012-10-22 ENCOUNTER — Other Ambulatory Visit: Payer: Self-pay | Admitting: *Deleted

## 2012-10-22 MED ORDER — AMLODIPINE BESYLATE 10 MG PO TABS: 10.0000 mg | ORAL_TABLET | Freq: Every day | ORAL | Status: DC

## 2012-10-22 MED ORDER — VALSARTAN 320 MG PO TABS: 320.0000 mg | ORAL_TABLET | Freq: Every day | ORAL | Status: DC

## 2012-10-22 NOTE — Telephone Encounter (Signed)
Patient states that he has not been able to take his blood pressures as requested. He states that he is going to start this weekend. He also complains of cost of Exforge - I discontinued medication and split it into prescription for Amlodipine and Valsartan. Explained this to patient and he is agreeable to see if this cut cost for him. He will call back if issue arises or cost is not better this way. Appointment made for 11/4 at 9:00am with Dr. Graciela Husbands for follow up per Rick Duff PAC orders.

## 2012-11-02 ENCOUNTER — Other Ambulatory Visit: Payer: Self-pay | Admitting: Internal Medicine

## 2012-11-03 ENCOUNTER — Telehealth: Payer: Self-pay | Admitting: *Deleted

## 2012-11-03 NOTE — Telephone Encounter (Signed)
Byrd Hesselbach, pts wife called requesting Klonopin refill.  Advised pt needs appointment before refill given.  Please advise

## 2012-11-10 ENCOUNTER — Telehealth: Payer: Self-pay | Admitting: Internal Medicine

## 2012-11-10 NOTE — Telephone Encounter (Signed)
New Problem  Pt called to give BP readings// Request a call back to discuss.   Saturday 155/60  Sunday 148/54 Monday 148/62 Tuesday 142/52 Wednesday 148/56

## 2012-11-12 ENCOUNTER — Encounter: Payer: Self-pay | Admitting: Internal Medicine

## 2012-11-12 ENCOUNTER — Ambulatory Visit (INDEPENDENT_AMBULATORY_CARE_PROVIDER_SITE_OTHER): Payer: BC Managed Care – PPO | Admitting: Internal Medicine

## 2012-11-12 ENCOUNTER — Other Ambulatory Visit (INDEPENDENT_AMBULATORY_CARE_PROVIDER_SITE_OTHER): Payer: BC Managed Care – PPO

## 2012-11-12 VITALS — BP 142/76 | HR 51 | Temp 98.5°F | Resp 16 | Ht 74.0 in | Wt 230.0 lb

## 2012-11-12 DIAGNOSIS — I10 Essential (primary) hypertension: Secondary | ICD-10-CM

## 2012-11-12 DIAGNOSIS — F411 Generalized anxiety disorder: Secondary | ICD-10-CM

## 2012-11-12 DIAGNOSIS — I4892 Unspecified atrial flutter: Secondary | ICD-10-CM

## 2012-11-12 DIAGNOSIS — R809 Proteinuria, unspecified: Secondary | ICD-10-CM

## 2012-11-12 LAB — URINALYSIS, ROUTINE W REFLEX MICROSCOPIC
Leukocytes, UA: NEGATIVE
Nitrite: NEGATIVE
Total Protein, Urine: NEGATIVE
pH: 6.5 (ref 5.0–8.0)

## 2012-11-12 LAB — BASIC METABOLIC PANEL
CO2: 27 mEq/L (ref 19–32)
Chloride: 105 mEq/L (ref 96–112)
Potassium: 3.5 mEq/L (ref 3.5–5.1)
Sodium: 138 mEq/L (ref 135–145)

## 2012-11-12 LAB — HEMOGLOBIN A1C: Hgb A1c MFr Bld: 7.7 % — ABNORMAL HIGH (ref 4.6–6.5)

## 2012-11-12 MED ORDER — CLONAZEPAM 0.5 MG PO TABS: 0.5000 mg | ORAL_TABLET | Freq: Three times a day (TID) | ORAL | Status: DC | PRN

## 2012-11-12 NOTE — Patient Instructions (Signed)
Type 2 Diabetes Mellitus, Adult Type 2 diabetes mellitus, often simply referred to as type 2 diabetes, is a long-lasting (chronic) disease. In type 2 diabetes, the pancreas does not make enough insulin (a hormone), the cells are less responsive to the insulin that is made (insulin resistance), or both. Normally, insulin moves sugars from food into the tissue cells. The tissue cells use the sugars for energy. The lack of insulin or the lack of normal response to insulin causes excess sugars to build up in the blood instead of going into the tissue cells. As a result, high blood sugar (hyperglycemia) develops. The effect of high sugar (glucose) levels can cause many complications. Type 2 diabetes was also previously called adult-onset diabetes but it can occur at any age.  RISK FACTORS  A person is predisposed to developing type 2 diabetes if someone in the family has the disease and also has one or more of the following primary risk factors:  Overweight.  An inactive lifestyle.  A history of consistently eating high-calorie foods. Maintaining a normal weight and regular physical activity can reduce the chance of developing type 2 diabetes. SYMPTOMS  A person with type 2 diabetes may not show symptoms initially. The symptoms of type 2 diabetes appear slowly. The symptoms include:  Increased thirst (polydipsia).  Increased urination (polyuria).  Increased urination during the night (nocturia).  Weight loss. This weight loss may be rapid.  Frequent, recurring infections.  Tiredness (fatigue).  Weakness.  Vision changes, such as blurred vision.  Fruity smell to your breath.  Abdominal pain.  Nausea or vomiting.  Cuts or bruises which are slow to heal.  Tingling or numbness in the hands or feet. DIAGNOSIS Type 2 diabetes is frequently not diagnosed until complications of diabetes are present. Type 2 diabetes is diagnosed when symptoms or complications are present and when blood  glucose levels are increased. Your blood glucose level may be checked by one or more of the following blood tests:  A fasting blood glucose test. You will not be allowed to eat for at least 8 hours before a blood sample is taken.  A random blood glucose test. Your blood glucose is checked at any time of the day regardless of when you ate.  A hemoglobin A1c blood glucose test. A hemoglobin A1c test provides information about blood glucose control over the previous 3 months.  An oral glucose tolerance test (OGTT). Your blood glucose is measured after you have not eaten (fasted) for 2 hours and then after you drink a glucose-containing beverage. TREATMENT   You may need to take insulin or diabetes medicine daily to keep blood glucose levels in the desired range.  You will need to match insulin dosing with exercise and healthy food choices. The treatment goal is to maintain the before meal blood sugar (preprandial glucose) level at 70 130 mg/dL. HOME CARE INSTRUCTIONS   Have your hemoglobin A1c level checked twice a year.  Perform daily blood glucose monitoring as directed by your caregiver.  Monitor urine ketones when you are ill and as directed by your caregiver.  Take your diabetes medicine or insulin as directed by your caregiver to maintain your blood glucose levels in the desired range.  Never run out of diabetes medicine or insulin. It is needed every day.  Adjust insulin based on your intake of carbohydrates. Carbohydrates can raise blood glucose levels but need to be included in your diet. Carbohydrates provide vitamins, minerals, and fiber which are an essential part of   a healthy diet. Carbohydrates are found in fruits, vegetables, whole grains, dairy products, legumes, and foods containing added sugars.    Eat healthy foods. Alternate 3 meals with 3 snacks.  Lose weight if overweight.  Carry a medical alert card or wear your medical alert jewelry.  Carry a 15 gram  carbohydrate snack with you at all times to treat low blood glucose (hypoglycemia). Some examples of 15 gram carbohydrate snacks include:  Glucose tablets, 3 or 4   Glucose gel, 15 gram tube  Raisins, 2 tablespoons (24 grams)  Jelly beans, 6  Animal crackers, 8  Regular pop, 4 ounces (120 mL)  Gummy treats, 9  Recognize hypoglycemia. Hypoglycemia occurs with blood glucose levels of 70 mg/dL and below. The risk for hypoglycemia increases when fasting or skipping meals, during or after intense exercise, and during sleep. Hypoglycemia symptoms can include:  Tremors or shakes.  Decreased ability to concentrate.  Sweating.  Increased heart rate.  Headache.  Dry mouth.  Hunger.  Irritability.  Anxiety.  Restless sleep.  Altered speech or coordination.  Confusion.  Treat hypoglycemia promptly. If you are alert and able to safely swallow, follow the 15:15 rule:  Take 15 20 grams of rapid-acting glucose or carbohydrate. Rapid-acting options include glucose gel, glucose tablets, or 4 ounces (120 mL) of fruit juice, regular soda, or low fat milk.  Check your blood glucose level 15 minutes after taking the glucose.  Take 15 20 grams more of glucose if the repeat blood glucose level is still 70 mg/dL or below.  Eat a meal or snack within 1 hour once blood glucose levels return to normal.    Be alert to polyuria and polydipsia which are early signs of hyperglycemia. An early awareness of hyperglycemia allows for prompt treatment. Treat hyperglycemia as directed by your caregiver.  Engage in at least 150 minutes of moderate-intensity physical activity a week, spread over at least 3 days of the week or as directed by your caregiver. In addition, you should engage in resistance exercise at least 2 times a week or as directed by your caregiver.  Adjust your medicine and food intake as needed if you start a new exercise or sport.  Follow your sick day plan at any time you  are unable to eat or drink as usual.  Avoid tobacco use.  Limit alcohol intake to no more than 1 drink per day for nonpregnant women and 2 drinks per day for men. You should drink alcohol only when you are also eating food. Talk with your caregiver whether alcohol is safe for you. Tell your caregiver if you drink alcohol several times a week.  Follow up with your caregiver regularly.  Schedule an eye exam soon after the diagnosis of type 2 diabetes and then annually.  Perform daily skin and foot care. Examine your skin and feet daily for cuts, bruises, redness, nail problems, bleeding, blisters, or sores. A foot exam by a caregiver should be done annually.  Brush your teeth and gums at least twice a day and floss at least once a day. Follow up with your dentist regularly.  Share your diabetes management plan with your workplace or school.  Stay up-to-date with immunizations.  Learn to manage stress.  Obtain ongoing diabetes education and support as needed.  Participate in, or seek rehabilitation as needed to maintain or improve independence and quality of life. Request a physical or occupational therapy referral if you are having foot or hand numbness or difficulties with grooming,   dressing, eating, or physical activity. SEEK MEDICAL CARE IF:   You are unable to eat food or drink fluids for more than 6 hours.  You have nausea and vomiting for more than 6 hours.  Your blood glucose level is over 240 mg/dL.  There is a change in mental status.  You develop an additional serious illness.  You have diarrhea for more than 6 hours.  You have been sick or have had a fever for a couple of days and are not getting better.  You have pain during any physical activity.  SEEK IMMEDIATE MEDICAL CARE IF:  You have difficulty breathing.  You have moderate to large ketone levels. MAKE SURE YOU:  Understand these instructions.  Will watch your condition.  Will get help right away if  you are not doing well or get worse. Document Released: 12/30/2004 Document Revised: 09/24/2011 Document Reviewed: 07/29/2011 [employer] Patient Information 2014 [employer].  

## 2012-11-12 NOTE — Progress Notes (Signed)
Subjective:    Patient ID: Larry Rangel, male    DOB: 05/18/1950, 62 y.o.   MRN: [id number]  Diabetes He presents for his follow-up diabetic visit. He has type 2 diabetes mellitus. His disease course has been fluctuating. Hypoglycemia symptoms include nervousness/anxiousness. Pertinent negatives for hypoglycemia include no confusion, dizziness or tremors. Pertinent negatives for diabetes include no blurred vision, no chest pain, no fatigue, no foot paresthesias, no foot ulcerations, no polydipsia, no polyphagia, no polyuria, no visual change, no weakness and no weight loss. There are no hypoglycemic complications. Diabetic complications include heart disease. When asked about current treatments, none were reported. His weight is stable. He is following a generally unhealthy diet. When asked about meal planning, he reported none. He has not had a previous visit with a dietician. He never participates in exercise. There is no change in his home blood glucose trend. An ACE inhibitor/angiotensin II receptor blocker is being taken. He does not see a podiatrist.Eye exam is not current.      Review of Systems  Constitutional: Negative.  Negative for fever, chills, weight loss, diaphoresis, appetite change and fatigue.  HENT: Negative.   Eyes: Negative.  Negative for blurred vision.  Respiratory: Negative.  Negative for cough, chest tightness, wheezing and stridor.   Cardiovascular: Negative.  Negative for chest pain, palpitations and leg swelling.  Gastrointestinal: Negative.  Negative for nausea, abdominal pain, diarrhea, constipation and blood in stool.  Endocrine: Negative.  Negative for polydipsia, polyphagia and polyuria.  Genitourinary: Negative.   Musculoskeletal: Negative.  Negative for arthralgias, back pain, gait problem, joint swelling, myalgias, neck pain and neck stiffness.  Allergic/Immunologic: Negative.   Neurological: Negative.  Negative for dizziness, tremors, weakness,  light-headedness and numbness.  Hematological: Negative.  Negative for adenopathy. Does not bruise/bleed easily.  Psychiatric/Behavioral: Negative for suicidal ideas, hallucinations, behavioral problems, confusion, sleep disturbance, self-injury, dysphoric mood, decreased concentration and agitation. The patient is nervous/anxious. The patient is not hyperactive.        Objective:   Physical Exam  Vitals reviewed. Constitutional: He is oriented to person, place, and time. He appears well-developed and well-nourished. No distress.  HENT:  Head: Normocephalic and atraumatic.  Mouth/Throat: Oropharynx is clear and moist. No oropharyngeal exudate.  Eyes: Conjunctivae are normal. Right eye exhibits no discharge. Left eye exhibits no discharge. No scleral icterus.  Neck: Normal range of motion. Neck supple. No JVD present. No tracheal deviation present. No thyromegaly present.  Cardiovascular: Normal rate, regular rhythm, S1 normal, S2 normal and intact distal pulses.  Exam reveals no gallop, no S3, no S4 and no friction rub.   Murmur heard.  Decrescendo systolic murmur is present with a grade of 1/6   No diastolic murmur is present  Pulses:      Carotid pulses are 1+ on the right side, and 1+ on the left side.      Radial pulses are 1+ on the right side, and 1+ on the left side.       Femoral pulses are 1+ on the right side, and 1+ on the left side.      Popliteal pulses are 1+ on the right side, and 1+ on the left side.       Dorsalis pedis pulses are 1+ on the right side, and 1+ on the left side.       Posterior tibial pulses are 1+ on the right side, and 1+ on the left side.  Pulmonary/Chest: Effort normal and breath sounds normal. No stridor. No  respiratory distress. He has no wheezes. He has no rales. He exhibits no tenderness.  Abdominal: Soft. Bowel sounds are normal. He exhibits no distension and no mass. There is no tenderness. There is no rebound and no guarding.  Musculoskeletal:  Normal range of motion. He exhibits no edema and no tenderness.  Lymphadenopathy:    He has no cervical adenopathy.  Neurological: He is oriented to person, place, and time.  Skin: Skin is warm and dry. No rash noted. He is not diaphoretic. No erythema. No pallor.  Psychiatric: He has a normal mood and affect. His behavior is normal. Judgment and thought content normal.     Lab Results  Component Value Date   WBC 10.1 07/28/2012   HGB 15.8 07/28/2012   HCT 47.3 07/28/2012   PLT 171.0 07/28/2012   GLUCOSE 118* 07/28/2012   CHOL 113 04/16/2012   TRIG 157.0* 04/16/2012   HDL 22.80* 04/16/2012   LDLDIRECT 52.0 03/17/2011   LDLCALC 59 04/16/2012   ALT 65* 04/16/2012   AST 65* 04/16/2012   NA 141 07/28/2012   K 3.8 07/28/2012   CL 105 07/28/2012   CREATININE 1.1 07/28/2012   BUN 15 07/28/2012   CO2 27 07/28/2012   TSH 0.38 04/16/2012   PSA 1.86 04/16/2012   INR 1.3* 07/28/2012   HGBA1C 7.2* 04/16/2012   MICROALBUR 7.6* 04/16/2012       Assessment & Plan:

## 2012-11-12 NOTE — Telephone Encounter (Signed)
Discussed with Larry Rangel [facility] (she asked him to call these in 1 week after seeing him in August). BPs not too high, patient cannot tolerate it under 120. Follow up with Dr. Graciela Husbands 11/4. Patient instructed to continue taking readings to bring to that appointment. Verbalized understanding and agreeable to plan.

## 2012-11-13 ENCOUNTER — Encounter: Payer: Self-pay | Admitting: Internal Medicine

## 2012-11-13 ENCOUNTER — Other Ambulatory Visit: Payer: Self-pay | Admitting: Internal Medicine

## 2012-11-13 DIAGNOSIS — R809 Proteinuria, unspecified: Secondary | ICD-10-CM | POA: Insufficient documentation

## 2012-11-13 DIAGNOSIS — F411 Generalized anxiety disorder: Secondary | ICD-10-CM | POA: Insufficient documentation

## 2012-11-13 LAB — DRUGS OF ABUSE SCREEN W/O ALC, ROUTINE URINE
Amphetamine Screen, Ur: NEGATIVE
Benzodiazepines.: NEGATIVE
Methadone: NEGATIVE

## 2012-11-13 MED ORDER — LINAGLIPTIN-METFORMIN HCL 2.5-850 MG PO TABS: 1.0000 | ORAL_TABLET | Freq: Two times a day (BID) | ORAL | Status: DC

## 2012-11-13 NOTE — Assessment & Plan Note (Signed)
He has good rate and rhythm control 

## 2012-11-13 NOTE — Assessment & Plan Note (Signed)
His BP is well controlled 

## 2012-11-13 NOTE — Assessment & Plan Note (Signed)
His A1C is high so I have asked him to start [email or web address] He is also due for an eye exam

## 2012-11-13 NOTE — Assessment & Plan Note (Signed)
He asked for a refill on klonopin and he told me that he has taken a dose that day as he does nearly every day The prescription was written and a UDS was ordered The UDS is negative for BZD so I have informed him that I will not prescribe a BZD for him anymore since there are significant inconsistensies

## 2012-11-16 ENCOUNTER — Ambulatory Visit (INDEPENDENT_AMBULATORY_CARE_PROVIDER_SITE_OTHER): Payer: BC Managed Care – PPO | Admitting: Internal Medicine

## 2012-11-16 ENCOUNTER — Encounter: Payer: Self-pay | Admitting: Internal Medicine

## 2012-11-16 VITALS — BP 150/79 | HR 49 | Ht 74.0 in | Wt 225.8 lb

## 2012-11-16 DIAGNOSIS — I428 Other cardiomyopathies: Secondary | ICD-10-CM

## 2012-11-16 DIAGNOSIS — I429 Cardiomyopathy, unspecified: Secondary | ICD-10-CM

## 2012-11-16 DIAGNOSIS — E785 Hyperlipidemia, unspecified: Secondary | ICD-10-CM

## 2012-11-16 DIAGNOSIS — I10 Essential (primary) hypertension: Secondary | ICD-10-CM

## 2012-11-16 DIAGNOSIS — I4892 Unspecified atrial flutter: Secondary | ICD-10-CM

## 2012-11-16 DIAGNOSIS — I25810 Atherosclerosis of coronary artery bypass graft(s) without angina pectoris: Secondary | ICD-10-CM

## 2012-11-16 NOTE — Assessment & Plan Note (Signed)
No recurrent flutter of which he is aware

## 2012-11-16 NOTE — Assessment & Plan Note (Signed)
We will reassess left ventricular function now status post ablation.

## 2012-11-16 NOTE — Progress Notes (Signed)
Patient Care Team: Etta Grandchild, MD as PCP - General (Internal Medicine)   HPI  Larry Rangel is a 62 y.o. male Seen in followup for atrial flutter ablation undertake 7/14  He also has ICM with prior CABG  HTN and DM EF 35%; repeat echo pending  The patient denies chest pain, shortness of breath, nocturnal dyspnea, orthopnea or peripheral edema.  There have been no palpitations, lightheadedness or syncope.   Better post RFCA  As   sleep disordered breathing as well as daytime somnolence; his blood pressure has been difficult to control for 30 yearsh  Past Medical History  Diagnosis Date  . Anxiety   . Hypertension   . Low back pain   . Tobacco user   . CAD (coronary artery disease) of artery bypass graft   . Hyperlipidemia   . S/P colonoscopy   . Cardiomyopathy     Echo 6/14: Mild LVH, EF 30-35%, diffuse HK, MAC, mild BAE  . Shortness of breath   . Myocardial infarction   . Arthritis   . Atrial flutter 07-2012    s/p ablation by Dr Graciela Husbands 08-06-2012    Past Surgical History  Procedure Laterality Date  . Shoulder surgery    . Coronary artery bypass graft  2009    x 5  . Tee without cardioversion N/A 07/15/2012    Procedure: TRANSESOPHAGEAL ECHOCARDIOGRAM (TEE);  Surgeon: Vesta Mixer, MD;  Location: [facility] ENDOSCOPY;  Service: Cardiovascular;  Laterality: N/A;  . Cardioversion N/A 07/15/2012    Procedure: CARDIOVERSION;  Surgeon: Vesta Mixer, MD;  Location: [facility] ENDOSCOPY;  Service: Cardiovascular;  Laterality: N/A;  . Ablation of dysrhythmic focus  08/06/2012    CTI ablation by Dr Graciela Husbands    Current Outpatient Prescriptions  Medication Sig Dispense Refill  . amLODipine (NORVASC) 10 MG tablet Take 1 tablet (10 mg total) by mouth daily.  90 tablet  3  . aspirin EC 81 MG tablet Take 1 tablet (81 mg total) by mouth daily.      . busPIRone (BUSPAR) 10 MG tablet TAKE 1 TABLET TWICE A DAY  180 tablet  1  . clonazePAM (KLONOPIN) 0.5 MG tablet 0.5 mg 3 (three)  times daily. Or as needed      . cyclobenzaprine (FLEXERIL) 10 MG tablet TAKE 1 TABLET BY MOUTH 3 TIMES A DAY AS NEEDED  90 tablet  3  . Fenofibrate 150 MG CAPS Take 150 mg by mouth daily.      . Linagliptin-Metformin HCl (JENTADUETO) 2.5-850 MG TABS Take 1 tablet by mouth 2 (two) times daily.  180 tablet  1  . Nebivolol HCl (BYSTOLIC) 20 MG TABS Take 0.5 tablets (10 mg total) by mouth daily.  90 tablet  3  . Omega-3 Fatty Acids (FISH OIL PO) Take 1 capsule by mouth daily.       Marland Kitchen POTASSIUM PO Take 1 tablet by mouth daily. otc - pt does not know strength      . simvastatin (ZOCOR) 20 MG tablet Take 20 mg by mouth daily.      . valsartan (DIOVAN) 320 MG tablet Take 1 tablet (320 mg total) by mouth daily.  90 tablet  3   No current facility-administered medications for this visit.    No Known Allergies  Review of Systems negative except from HPI and PMH  Physical Exam BP 150/79  Pulse 49  Ht 6\' 2"  (1.88 m)  Wt 225 lb 12.8 oz (102.422 kg)  BMI 28.98 kg/m2 Well developed and well nourished in no acute distress HENT normal E scleral and icterus clear Neck Supple JVP flat; carotids brisk and full Clear to ausculation  Regular rate and rhythm, 2/6 early sys murmur preserved S2 Soft with active bowel sounds No clubbing cyanosis none Edema Alert and oriented, grossly normal motor and sensory function Skin Warm and Dry  ECG demonstrates sinus rhythm at 49 Intervals 20/13/40 Poor R-wave progression Left ventricular hypertrophy with repolarization abnormalities  Assessment and  Plan

## 2012-11-16 NOTE — Assessment & Plan Note (Signed)
Stable on current medications 

## 2012-11-16 NOTE — Patient Instructions (Signed)
Your physician has requested that you have an echocardiogram. Echocardiography is a painless test that uses sound waves to create images of your heart. It provides your doctor with information about the size and shape of your heart and how well your heart's chambers and valves are working. This procedure takes approximately one hour. There are no restrictions for this procedure.  Your physician recommends that you continue on your current medications as directed. Please refer to the Current Medication list given to you today.  Your physician wants you to follow-up in: 1 year with Dr. Graciela Husbands. You will receive a reminder letter in the mail two months in advance. If you don't receive a letter, please call our office to schedule the follow-up appointment.

## 2012-11-16 NOTE — Assessment & Plan Note (Signed)
LDL in last assessment was 59 with high triglycerides

## 2012-11-16 NOTE — Assessment & Plan Note (Signed)
Reasonably controlled for him. We'll continue current medications.

## 2012-11-18 ENCOUNTER — Other Ambulatory Visit: Payer: Self-pay

## 2012-11-24 ENCOUNTER — Other Ambulatory Visit: Payer: Self-pay | Admitting: Cardiology

## 2012-11-24 ENCOUNTER — Other Ambulatory Visit: Payer: Self-pay | Admitting: Internal Medicine

## 2012-12-11 ENCOUNTER — Other Ambulatory Visit: Payer: Self-pay | Admitting: Internal Medicine

## 2012-12-13 ENCOUNTER — Ambulatory Visit: Payer: BC Managed Care – PPO | Admitting: Internal Medicine

## 2012-12-14 ENCOUNTER — Other Ambulatory Visit: Payer: Self-pay | Admitting: Internal Medicine

## 2012-12-16 ENCOUNTER — Other Ambulatory Visit: Payer: Self-pay | Admitting: Internal Medicine

## 2012-12-16 ENCOUNTER — Telehealth: Payer: Self-pay | Admitting: *Deleted

## 2012-12-16 ENCOUNTER — Encounter: Payer: Self-pay | Admitting: *Deleted

## 2012-12-16 ENCOUNTER — Telehealth: Payer: Self-pay | Admitting: Internal Medicine

## 2012-12-16 NOTE — Telephone Encounter (Signed)
OK with me.

## 2012-12-16 NOTE — Telephone Encounter (Signed)
Patient would like to transfer to Dr. Abner Greenspan. Is this ok? Thanks!

## 2012-12-16 NOTE — Patient Instructions (Signed)
A user error has taken place: encounter opened in error, closed for administrative reasons.

## 2012-12-16 NOTE — Telephone Encounter (Signed)
yes

## 2012-12-16 NOTE — Telephone Encounter (Signed)
Left message for patient to return my call to schedule new patient appointment with Dr. Abner Greenspan

## 2012-12-16 NOTE — Telephone Encounter (Signed)
Dr Graciela Husbands made aware of echo cancellation.

## 2012-12-24 ENCOUNTER — Other Ambulatory Visit (HOSPITAL_COMMUNITY): Payer: BC Managed Care – PPO

## 2013-01-25 ENCOUNTER — Other Ambulatory Visit: Payer: Self-pay | Admitting: Internal Medicine

## 2013-03-08 ENCOUNTER — Ambulatory Visit: Payer: BC Managed Care – PPO | Admitting: Family Medicine

## 2013-03-09 ENCOUNTER — Ambulatory Visit (INDEPENDENT_AMBULATORY_CARE_PROVIDER_SITE_OTHER): Payer: BC Managed Care – PPO | Admitting: Family Medicine

## 2013-03-09 ENCOUNTER — Encounter: Payer: Self-pay | Admitting: Family Medicine

## 2013-03-09 VITALS — BP 142/82 | HR 93 | Temp 97.7°F | Ht 74.0 in | Wt 223.1 lb

## 2013-03-09 DIAGNOSIS — IMO0001 Reserved for inherently not codable concepts without codable children: Secondary | ICD-10-CM

## 2013-03-09 DIAGNOSIS — E785 Hyperlipidemia, unspecified: Secondary | ICD-10-CM

## 2013-03-09 DIAGNOSIS — E119 Type 2 diabetes mellitus without complications: Secondary | ICD-10-CM

## 2013-03-09 DIAGNOSIS — R945 Abnormal results of liver function studies: Secondary | ICD-10-CM

## 2013-03-09 DIAGNOSIS — I10 Essential (primary) hypertension: Secondary | ICD-10-CM

## 2013-03-09 DIAGNOSIS — E1165 Type 2 diabetes mellitus with hyperglycemia: Secondary | ICD-10-CM

## 2013-03-09 DIAGNOSIS — K769 Liver disease, unspecified: Secondary | ICD-10-CM

## 2013-03-09 LAB — CBC
HEMATOCRIT: 48.4 % (ref 39.0–52.0)
HEMOGLOBIN: 16.8 g/dL (ref 13.0–17.0)
MCH: 29.6 pg (ref 26.0–34.0)
MCHC: 34.7 g/dL (ref 30.0–36.0)
MCV: 85.2 fL (ref 78.0–100.0)
Platelets: 197 10*3/uL (ref 150–400)
RBC: 5.68 MIL/uL (ref 4.22–5.81)
RDW: 13.3 % (ref 11.5–15.5)
WBC: 7.7 10*3/uL (ref 4.0–10.5)

## 2013-03-09 LAB — RENAL FUNCTION PANEL
Albumin: 4.5 g/dL (ref 3.5–5.2)
BUN: 11 mg/dL (ref 6–23)
CALCIUM: 10.3 mg/dL (ref 8.4–10.5)
CHLORIDE: 103 meq/L (ref 96–112)
CO2: 29 mEq/L (ref 19–32)
Creat: 0.99 mg/dL (ref 0.50–1.35)
Glucose, Bld: 181 mg/dL — ABNORMAL HIGH (ref 70–99)
PHOSPHORUS: 3.1 mg/dL (ref 2.3–4.6)
POTASSIUM: 4.6 meq/L (ref 3.5–5.3)
SODIUM: 143 meq/L (ref 135–145)

## 2013-03-09 LAB — HEPATIC FUNCTION PANEL
ALK PHOS: 92 U/L (ref 39–117)
ALT: 82 U/L — ABNORMAL HIGH (ref 0–53)
AST: 66 U/L — ABNORMAL HIGH (ref 0–37)
Albumin: 4.5 g/dL (ref 3.5–5.2)
BILIRUBIN DIRECT: 0.2 mg/dL (ref 0.0–0.3)
BILIRUBIN INDIRECT: 0.5 mg/dL (ref 0.2–1.2)
BILIRUBIN TOTAL: 0.7 mg/dL (ref 0.2–1.2)
TOTAL PROTEIN: 7.3 g/dL (ref 6.0–8.3)

## 2013-03-09 LAB — TSH: TSH: 0.744 u[IU]/mL (ref 0.350–4.500)

## 2013-03-09 LAB — LIPID PANEL
CHOL/HDL RATIO: 3.6 ratio
CHOLESTEROL: 129 mg/dL (ref 0–200)
HDL: 36 mg/dL — ABNORMAL LOW (ref >39)
LDL CALC: 57 mg/dL (ref 0–99)
Triglycerides: 181 mg/dL — ABNORMAL HIGH (ref <150)
VLDL: 36 mg/dL (ref 0–40)

## 2013-03-09 LAB — HEMOGLOBIN A1C
Hgb A1c MFr Bld: 8 % — ABNORMAL HIGH (ref <5.7)
MEAN PLASMA GLUCOSE: 183 mg/dL — AB (ref <117)

## 2013-03-09 MED ORDER — CLONAZEPAM 0.5 MG PO TABS: 0.5000 mg | ORAL_TABLET | Freq: Three times a day (TID) | ORAL | Status: DC | PRN

## 2013-03-09 NOTE — Patient Instructions (Signed)
Preventive Care for Adults, Male A healthy lifestyle and preventive care can promote health and wellness. Preventive health guidelines for men include the following key practices:  A routine yearly physical is a good way to check with your health care provider about your health and preventative screening. It is a chance to share any concerns and updates on your health and to receive a thorough exam.  Visit your dentist for a routine exam and preventative care every 6 months. Brush your teeth twice a day and floss once a day. Good oral hygiene prevents tooth decay and gum disease.  The frequency of eye exams is based on your age, health, family medical history, use of contact lenses, and other factors. Follow your health care provider's recommendations for frequency of eye exams.  Eat a healthy diet. Foods such as vegetables, fruits, whole grains, low-fat dairy products, and lean protein foods contain the nutrients you need without too many calories. Decrease your intake of foods high in solid fats, added sugars, and salt. Eat the right amount of calories for you.Get information about a proper diet from your health care provider, if necessary.  Regular physical exercise is one of the most important things you can do for your health. Most adults should get at least 150 minutes of moderate-intensity exercise (any activity that increases your heart rate and causes you to sweat) each week. In addition, most adults need muscle-strengthening exercises on 2 or more days a week.  Maintain a healthy weight. The body mass index (BMI) is a screening tool to identify possible weight problems. It provides an estimate of body fat based on height and weight. Your health care provider can find your BMI and can help you achieve or maintain a healthy weight.For adults 20 years and older:  A BMI below 18.5 is considered underweight.  A BMI of 18.5 to 24.9 is normal.  A BMI of 25 to 29.9 is considered  overweight.  A BMI of 30 and above is considered obese.  Maintain normal blood lipids and cholesterol levels by exercising and minimizing your intake of saturated fat. Eat a balanced diet with plenty of fruit and vegetables. Blood tests for lipids and cholesterol should begin at age 42 and be repeated every 5 years. If your lipid or cholesterol levels are high, you are over 50, or you are at high risk for heart disease, you may need your cholesterol levels checked more frequently.Ongoing high lipid and cholesterol levels should be treated with medicines if diet and exercise are not working.  If you smoke, find out from your health care provider how to quit. If you do not use tobacco, do not start.  Lung cancer screening is recommended for adults aged 24 80 years who are at high risk for developing lung cancer because of a history of smoking. A yearly low-dose CT scan of the lungs is recommended for people who have at least a 30-pack-year history of smoking and are a current smoker or have quit within the past 15 years. A pack year of smoking is smoking an average of 1 pack of cigarettes a day for 1 year (for example: 1 pack a day for 30 years or 2 packs a day for 15 years). Yearly screening should continue until the smoker has stopped smoking for at least 15 years. Yearly screening should be stopped for people who develop a health problem that would prevent them from having lung cancer treatment.  If you choose to drink alcohol, do not have  more than 2 drinks per day. One drink is considered to be 12 ounces (355 mL) of beer, 5 ounces (148 mL) of wine, or 1.5 ounces (44 mL) of liquor.  Avoid use of street drugs. Do not share needles with anyone. Ask for help if you need support or instructions about stopping the use of drugs.  High blood pressure causes heart disease and increases the risk of stroke. Your blood pressure should be checked at least every 1 2 years. Ongoing high blood pressure should be  treated with medicines, if weight loss and exercise are not effective.  If you are 75 65 years old, ask your health care provider if you should take aspirin to prevent heart disease.  Diabetes screening involves taking a blood sample to check your fasting blood sugar level. This should be done once every 3 years, after age 19, if you are within normal weight and without risk factors for diabetes. Testing should be considered at a younger age or be carried out more frequently if you are overweight and have at least 1 risk factor for diabetes.  Colorectal cancer can be detected and often prevented. Most routine colorectal cancer screening begins at the age of 47 and continues through age 80. However, your health care provider may recommend screening at an earlier age if you have risk factors for colon cancer. On a yearly basis, your health care provider may provide home test kits to check for hidden blood in the stool. Use of a small camera at the end of a tube to directly examine the colon (sigmoidoscopy or colonoscopy) can detect the earliest forms of colorectal cancer. Talk to your health care provider about this at age 66, when routine screening begins. Direct exam of the colon should be repeated every 5 10 years through age 19, unless early forms of precancerous polyps or small growths are found.  People who are at an increased risk for hepatitis B should be screened for this virus. You are considered at high risk for hepatitis B if:  You were born in a country where hepatitis B occurs often. Talk with your health care provider about which countries are considered high-risk.  Your parents were born in a high-risk country and you have not received a shot to protect against hepatitis B (hepatitis B vaccine).  You have HIV or AIDS.  You use needles to inject street drugs.  You live with, or have sex with, someone who has hepatitis B.  You are a man who has sex with other men (MSM).  You get  hemodialysis treatment.  You take certain medicines for conditions such as cancer, organ transplantation, and autoimmune conditions.  Hepatitis C blood testing is recommended for all people born from [birth year] through 1965 and any individual with known risks for hepatitis C.  Practice safe sex. Use condoms and avoid high-risk sexual practices to reduce the spread of sexually transmitted infections (STIs). STIs include gonorrhea, chlamydia, syphilis, trichomonas, herpes, HPV, and human immunodeficiency virus (HIV). Herpes, HIV, and HPV are viral illnesses that have no cure. They can result in disability, cancer, and death.  A one-time screening for abdominal aortic aneurysm (AAA) and surgical repair of large AAAs by ultrasound are recommended for men ages 94 to 74 years who are current or former smokers.  Healthy men should no longer receive prostate-specific antigen (PSA) blood tests as part of routine cancer screening. Talk with your health care provider about prostate cancer screening.  Testicular cancer screening is not recommended  for adult males who have no symptoms. Screening includes self-exam, a health care provider exam, and other screening tests. Consult with your health care provider about any symptoms you have or any concerns you have about testicular cancer.  Use sunscreen. Apply sunscreen liberally and repeatedly throughout the day. You should seek shade when your shadow is shorter than you. Protect yourself by wearing long sleeves, pants, a wide-brimmed hat, and sunglasses year round, whenever you are outdoors.  Once a month, do a whole-body skin exam, using a mirror to look at the skin on your back. Tell your health care provider about new moles, moles that have irregular borders, moles that are larger than a pencil eraser, or moles that have changed in shape or color.  Stay current with required vaccines (immunizations).  Influenza vaccine. All adults should be immunized every  year.  Tetanus, diphtheria, and acellular pertussis (Td, Tdap) vaccine. An adult who has not previously received Tdap or who does not know his vaccine status should receive 1 dose of Tdap. This initial dose should be followed by tetanus and diphtheria toxoids (Td) booster doses every 10 years. Adults with an unknown or incomplete history of completing a 3-dose immunization series with Td-containing vaccines should begin or complete a primary immunization series including a Tdap dose. Adults should receive a Td booster every 10 years.  Varicella vaccine. An adult without evidence of immunity to varicella should receive 2 doses or a second dose if he has previously received 1 dose.  Human papillomavirus (HPV) vaccine. Males aged 44 21 years who have not received the vaccine previously should receive the 3-dose series. Males aged 43 26 years may be immunized. Immunization is recommended through the age of 50 years for any male who has sex with males and did not get any or all doses earlier. Immunization is recommended for any person with an immunocompromised condition through the age of 23 years if he did not get any or all doses earlier. During the 3-dose series, the second dose should be obtained 4 8 weeks after the first dose. The third dose should be obtained 24 weeks after the first dose and 16 weeks after the second dose.  Zoster vaccine. One dose is recommended for adults aged 96 years or older unless certain conditions are present.  Measles, mumps, and rubella (MMR) vaccine. Adults born before 55 generally are considered immune to measles and mumps. Adults born in [birth year] or later should have 1 or more doses of MMR vaccine unless there is a contraindication to the vaccine or there is laboratory evidence of immunity to each of the three diseases. A routine second dose of MMR vaccine should be obtained at least 28 days after the first dose for students attending postsecondary schools, health care  workers, or international travelers. People who received inactivated measles vaccine or an unknown type of measles vaccine during 1963 1967 should receive 2 doses of MMR vaccine. People who received inactivated mumps vaccine or an unknown type of mumps vaccine before 1979 and are at high risk for mumps infection should consider immunization with 2 doses of MMR vaccine. Unvaccinated health care workers born before [birth year] who lack laboratory evidence of measles, mumps, or rubella immunity or laboratory confirmation of disease should consider measles and mumps immunization with 2 doses of MMR vaccine or rubella immunization with 1 dose of MMR vaccine.  Pneumococcal 13-valent conjugate (PCV13) vaccine. When indicated, a person who is uncertain of his immunization history and has no record of immunization  should receive the PCV13 vaccine. An adult aged 67 years or older who has certain medical conditions and has not been previously immunized should receive 1 dose of PCV13 vaccine. This PCV13 should be followed with a dose of pneumococcal polysaccharide (PPSV23) vaccine. The PPSV23 vaccine dose should be obtained at least 8 weeks after the dose of PCV13 vaccine. An adult aged 79 years or older who has certain medical conditions and previously received 1 or more doses of PPSV23 vaccine should receive 1 dose of PCV13. The PCV13 vaccine dose should be obtained 1 or more years after the last PPSV23 vaccine dose.  Pneumococcal polysaccharide (PPSV23) vaccine. When PCV13 is also indicated, PCV13 should be obtained first. All adults aged 74 years and older should be immunized. An adult younger than age 50 years who has certain medical conditions should be immunized. Any person who resides in a nursing home or long-term care facility should be immunized. An adult smoker should be immunized. People with an immunocompromised condition and certain other conditions should receive both PCV13 and PPSV23 vaccines. People with human  immunodeficiency virus (HIV) infection should be immunized as soon as possible after diagnosis. Immunization during chemotherapy or radiation therapy should be avoided. Routine use of PPSV23 vaccine is not recommended for American Indians, [place] Natives, or people younger than 65 years unless there are medical conditions that require PPSV23 vaccine. When indicated, people who have unknown immunization and have no record of immunization should receive PPSV23 vaccine. One-time revaccination 5 years after the first dose of PPSV23 is recommended for people aged 41 64 years who have chronic kidney failure, nephrotic syndrome, asplenia, or immunocompromised conditions. People who received 1 2 doses of PPSV23 before age 15 years should receive another dose of PPSV23 vaccine at age 48 years or later if at least 5 years have passed since the previous dose. Doses of PPSV23 are not needed for people immunized with PPSV23 at or after age 69 years.  Meningococcal vaccine. Adults with asplenia or persistent complement component deficiencies should receive 2 doses of quadrivalent meningococcal conjugate (MenACWY-D) vaccine. The doses should be obtained at least 2 months apart. Microbiologists working with certain meningococcal bacteria, [place] recruits, people at risk during an outbreak, and people who travel to or live in countries with a high rate of meningitis should be immunized. A first-year college student up through age 7 years who is living in a residence hall should receive a dose if he did not receive a dose on or after his 16th birthday. Adults who have certain high-risk conditions should receive one or more doses of vaccine.  Hepatitis A vaccine. Adults who wish to be protected from this disease, have certain high-risk conditions, work with hepatitis A-infected animals, work in hepatitis A research labs, or travel to or work in countries with a high rate of hepatitis A should be immunized. Adults who were  previously unvaccinated and who anticipate close contact with an international adoptee during the first 60 days after arrival in the [place] from a country with a high rate of hepatitis A should be immunized.  Hepatitis B vaccine. Adults who wish to be protected from this disease, have certain high-risk conditions, may be exposed to blood or other infectious body fluids, are household contacts or sex partners of hepatitis B positive people, are clients or workers in certain care facilities, or travel to or work in countries with a high rate of hepatitis B should be immunized.  Haemophilus influenzae type b (Hib) vaccine. A  previously unvaccinated person with asplenia or sickle cell disease or having a scheduled splenectomy should receive 1 dose of Hib vaccine. Regardless of previous immunization, a recipient of a hematopoietic stem cell transplant should receive a 3-dose series 6 12 months after his successful transplant. Hib vaccine is not recommended for adults with HIV infection. Preventive Service / Frequency Ages 62 to 3  Blood pressure check.** / Every 1 to 2 years.  Lipid and cholesterol check.** / Every 5 years beginning at age 43.  Hepatitis C blood test.** / For any individual with known risks for hepatitis C.  Skin self-exam. / Monthly.  Influenza vaccine. / Every year.  Tetanus, diphtheria, and acellular pertussis (Tdap, Td) vaccine.** / Consult your health care provider. 1 dose of Td every 10 years.  Varicella vaccine.** / Consult your health care provider.  HPV vaccine. / 3 doses over 6 months, if 48 or younger.  Measles, mumps, rubella (MMR) vaccine.** / You need at least 1 dose of MMR if you were born in 1957 or later. You may also need a second dose.  Pneumococcal 13-valent conjugate (PCV13) vaccine.** / Consult your health care provider.  Pneumococcal polysaccharide (PPSV23) vaccine.** / 1 to 2 doses if you smoke cigarettes or if you have certain  conditions.  Meningococcal vaccine.** / 1 dose if you are age 8 to 70 years and a Market researcher living in a residence hall, or have one of several medical conditions. You may also need additional booster doses.  Hepatitis A vaccine.** / Consult your health care provider.  Hepatitis B vaccine.** / Consult your health care provider.  Haemophilus influenzae type b (Hib) vaccine.** / Consult your health care provider. Ages 48 to 32  Blood pressure check.** / Every 1 to 2 years.  Lipid and cholesterol check.** / Every 5 years beginning at age 38.  Lung cancer screening. / Every year if you are aged 40 80 years and have a 30-pack-year history of smoking and currently smoke or have quit within the past 15 years. Yearly screening is stopped once you have quit smoking for at least 15 years or develop a health problem that would prevent you from having lung cancer treatment.  Fecal occult blood test (FOBT) of stool. / Every year beginning at age 4 and continuing until age 70. You may not have to do this test if you get a colonoscopy every 10 years.  Flexible sigmoidoscopy** or colonoscopy.** / Every 5 years for a flexible sigmoidoscopy or every 10 years for a colonoscopy beginning at age 76 and continuing until age 62.  Hepatitis C blood test.** / For all people born from [birth year] through 1965 and any individual with known risks for hepatitis C.  Skin self-exam. / Monthly.  Influenza vaccine. / Every year.  Tetanus, diphtheria, and acellular pertussis (Tdap/Td) vaccine.** / Consult your health care provider. 1 dose of Td every 10 years.  Varicella vaccine.** / Consult your health care provider.  Zoster vaccine.** / 1 dose for adults aged 60 years or older.  Measles, mumps, rubella (MMR) vaccine.** / You need at least 1 dose of MMR if you were born in 1957 or later. You may also need a second dose.  Pneumococcal 13-valent conjugate (PCV13) vaccine.** / Consult your health care  provider.  Pneumococcal polysaccharide (PPSV23) vaccine.** / 1 to 2 doses if you smoke cigarettes or if you have certain conditions.  Meningococcal vaccine.** / Consult your health care provider.  Hepatitis A vaccine.** / Consult your health care  provider.  Hepatitis B vaccine.** / Consult your health care provider.  Haemophilus influenzae type b (Hib) vaccine.** / Consult your health care provider. Ages 65 and over  Blood pressure check.** / Every 1 to 2 years.  Lipid and cholesterol check.**/ Every 5 years beginning at age 20.  Lung cancer screening. / Every year if you are aged 55 80 years and have a 30-pack-year history of smoking and currently smoke or have quit within the past 15 years. Yearly screening is stopped once you have quit smoking for at least 15 years or develop a health problem that would prevent you from having lung cancer treatment.  Fecal occult blood test (FOBT) of stool. / Every year beginning at age 50 and continuing until age 75. You may not have to do this test if you get a colonoscopy every 10 years.  Flexible sigmoidoscopy** or colonoscopy.** / Every 5 years for a flexible sigmoidoscopy or every 10 years for a colonoscopy beginning at age 50 and continuing until age 75.  Hepatitis C blood test.** / For all people born from 1945 through 1965 and any individual with known risks for hepatitis C.  Abdominal aortic aneurysm (AAA) screening.** / A one-time screening for ages 65 to 75 years who are current or former smokers.  Skin self-exam. / Monthly.  Influenza vaccine. / Every year.  Tetanus, diphtheria, and acellular pertussis (Tdap/Td) vaccine.** / 1 dose of Td every 10 years.  Varicella vaccine.** / Consult your health care provider.  Zoster vaccine.** / 1 dose for adults aged 60 years or older.  Pneumococcal 13-valent conjugate (PCV13) vaccine.** / Consult your health care provider.  Pneumococcal polysaccharide (PPSV23) vaccine.** / 1 dose for all  adults aged 65 years and older.  Meningococcal vaccine.** / Consult your health care provider.  Hepatitis A vaccine.** / Consult your health care provider.  Hepatitis B vaccine.** / Consult your health care provider.  Haemophilus influenzae type b (Hib) vaccine.** / Consult your health care provider. **Family history and personal history of risk and conditions may change your health care provider's recommendations. Document Released: 02/25/2001 Document Revised: 10/20/2012 Document Reviewed: 05/27/2010 [employer] Patient Information 2014 [employer].  

## 2013-03-09 NOTE — Progress Notes (Signed)
Pre visit review using our clinic review tool, if applicable. No additional management support is needed unless otherwise documented below in the visit note. 

## 2013-03-11 ENCOUNTER — Telehealth: Payer: Self-pay | Admitting: Family Medicine

## 2013-03-11 NOTE — Telephone Encounter (Signed)
Relevant patient education assigned to patient using Emmi. ° °

## 2013-03-13 ENCOUNTER — Encounter: Payer: Self-pay | Admitting: Family Medicine

## 2013-03-13 NOTE — Assessment & Plan Note (Signed)
Tolerating Simvastatin, avoid trans fats.  

## 2013-03-13 NOTE — Progress Notes (Signed)
Patient ID: Larry Rangel, male   DOB: [birth year], 63 y.o.   MRN: [id number] SAMMIE DENNER [id number] 1950-05-12 03/13/2013      Progress Note New Patient  Subjective  Chief Complaint  Chief Complaint  Patient presents with  . Establish Care    new patient    HPI  Patient is a 63 year old male who is here today to establish care. He generally feels well but has struggled with some shoulder pain. He uses Klonopin intermittently for anxiety. He reports that works well but he does need several doses daily. He denies ever using and assess I. No recent fevers. He denies headache, chest pain, palpitations or shortness of breath. No GI or GU concerns noted  Past Medical History  Diagnosis Date  . Anxiety   . Hypertension   . Low back pain   . Tobacco user   . CAD (coronary artery disease) of artery bypass graft   . Hyperlipidemia   . S/P colonoscopy   . Cardiomyopathy     Echo 6/14: Mild LVH, EF 30-35%, diffuse HK, MAC, mild BAE  . Shortness of breath   . Myocardial infarction   . Arthritis   . Atrial flutter 07-2012    s/p ablation by Dr Graciela Husbands 08-06-2012  . Chicken pox as a child  . Measles as a child  . Mumps as a child  . Abnormal liver function 08/23/2010    Past Surgical History  Procedure Laterality Date  . Shoulder surgery    . Coronary artery bypass graft  2009    x 5  . Tee without cardioversion N/A 07/15/2012    Procedure: TRANSESOPHAGEAL ECHOCARDIOGRAM (TEE);  Surgeon: Vesta Mixer, MD;  Location: [facility] ENDOSCOPY;  Service: Cardiovascular;  Laterality: N/A;  . Cardioversion N/A 07/15/2012    Procedure: CARDIOVERSION;  Surgeon: Vesta Mixer, MD;  Location: [facility] ENDOSCOPY;  Service: Cardiovascular;  Laterality: N/A;  . Ablation of dysrhythmic focus  08/06/2012    CTI ablation by Dr Graciela Husbands  . Tonsillectomy      Family History  Problem Relation Age of Onset  . Alzheimer's disease Mother   . Heart failure Father 42  . Hypertension Father   . Hyperlipidemia  Father   . Cancer Father     lung- took half of a young- smoker  . Heart disease Father     MI at 27  . Early death Neg Hx   . Kidney disease Neg Hx   . Stroke Neg Hx   . Leukemia Brother   . Drug abuse Daughter     heroine    History   Social History  . Marital Status: Married    Spouse Name: N/A    Number of Children: N/A  . Years of Education: N/A   Occupational History  . Not on file.   Social History Main Topics  . Smoking status: Former Smoker -- 1.00 packs/day for 40 years    Types: Cigarettes    Quit date: 02/01/2011  . Smokeless tobacco: Never Used  . Alcohol Use: 14.4 oz/week    24 Glasses of wine per week     Comment: drinks 2 bottles of wine per week  . Drug Use: No  . Sexual Activity: Yes   Other Topics Concern  . 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  He has been married four times previously, the first  For 39yrs and has two children by the marriage which are grown, the second time for 11 yrs and has 2 by that marriage.   Wife has custody in [place].   The third marriage was 4 1/2 yrs and most recent marriage was the past eight weeks and he is currently estranged from that person.    Current Outpatient Prescriptions on File Prior to Visit  Medication Sig Dispense Refill  . aspirin EC 81 MG tablet Take 1 tablet (81 mg total) by mouth daily.      . busPIRone (BUSPAR) 10 MG tablet TAKE 1 TABLET TWICE A DAY  180 tablet  1  . cyclobenzaprine (FLEXERIL) 10 MG tablet TAKE 1 TABLET BY MOUTH 3 TIMES A DAY AS NEEDED  90 tablet  3  . Fenofibrate 150 MG CAPS Take 150 mg by mouth daily.      . Linagliptin-Metformin HCl (JENTADUETO) 2.5-850 MG TABS Take 1 tablet by mouth 2 (two) times daily.  180 tablet  1  . LIPOFEN 150 MG CAPS TAKE ONE CAPSULE BY MOUTH EVERY DAY  90 capsule  1  . Omega-3 Fatty Acids (FISH OIL PO) Take  1 capsule by mouth daily.       Marland Kitchen POTASSIUM PO Take 1 tablet by mouth daily. otc - pt does not know strength      . simvastatin (ZOCOR) 20 MG tablet Take 20 mg by mouth daily.      Marland Kitchen BYSTOLIC 20 MG TABS TAKE 1 TABLET (20 MG TOTAL) BY MOUTH DAILY.  90 tablet  3   No current facility-administered medications on file prior to visit.    No Known Allergies  Review of Systems  Review of Systems  Constitutional: Negative for fever, chills and malaise/fatigue.  HENT: Negative for congestion, hearing loss and nosebleeds.   Eyes: Negative for discharge.  Respiratory: Negative for cough, sputum production, shortness of breath and wheezing.   Cardiovascular: Negative for chest pain, palpitations and leg swelling.  Gastrointestinal: Negative for heartburn, nausea, vomiting, abdominal pain, diarrhea, constipation and blood in stool.  Genitourinary: Negative for dysuria, urgency, frequency and hematuria.  Musculoskeletal: Negative for back pain, falls and myalgias.  Skin: Negative for rash.  Neurological: Negative for dizziness, tremors, sensory change, focal weakness, loss of consciousness, weakness and headaches.  Endo/Heme/Allergies: Negative for polydipsia. Does not bruise/bleed easily.  Psychiatric/Behavioral: Negative for depression and suicidal ideas. The patient is not nervous/anxious and does not have insomnia.     Objective  BP 142/82  Pulse 93  Temp(Src) 97.7 F (36.5 C) (Oral)  Ht 6\' 2"  (1.88 m)  Wt 223 lb 1.3 oz (101.188 kg)  BMI 28.63 kg/m2  SpO2 93%  Physical Exam  Physical Exam  Constitutional: He is oriented to person, place, and time and well-developed, well-nourished, and in no distress. No distress.  HENT:  Head: Normocephalic and atraumatic.  Eyes: Conjunctivae are normal.  Neck: Neck supple. No thyromegaly present.  Cardiovascular: Normal rate, regular rhythm and normal heart sounds.   No murmur heard. Pulmonary/Chest: Effort normal and breath sounds normal. No  respiratory distress.  Abdominal: He exhibits no distension and no mass. There is no tenderness.  Musculoskeletal: He exhibits no edema.  Neurological: He is alert and oriented to person, place, and time.  Skin: Skin is warm.  Psychiatric: Memory, affect and judgment normal.       Assessment & Plan  HYPERTENSION, UNSPECIFIED Adequately controlled, no changes  HYPERLIPIDEMIA-MIXED  Tolerating Simvastatin, avoid trans fats.   Type II or unspecified type diabetes mellitus without mention of complication, uncontrolled Elevated A1C, continue current meds for now and minimize simple carbs. Reassess with next lab draw  Abnormal liver function Likely fatty liver, consider minimize simple carbs and saturated fats

## 2013-03-13 NOTE — Assessment & Plan Note (Signed)
Likely fatty liver, consider minimize simple carbs and saturated fats

## 2013-03-13 NOTE — Assessment & Plan Note (Signed)
Elevated A1C, continue current meds for now and minimize simple carbs. Reassess with next lab draw

## 2013-03-13 NOTE — Assessment & Plan Note (Signed)
Adequately controlled, no changes 

## 2013-04-08 ENCOUNTER — Telehealth: Payer: Self-pay | Admitting: Family Medicine

## 2013-04-08 MED ORDER — BUSPIRONE HCL 10 MG PO TABS: 10.0000 mg | ORAL_TABLET | Freq: Two times a day (BID) | ORAL | Status: DC

## 2013-04-08 NOTE — Telephone Encounter (Signed)
Patient wife called in stating that insurance requires refills to be a 90 day supply

## 2013-04-08 NOTE — Telephone Encounter (Signed)
Patient wife called in requesting a refill of buspirone to be sent to [facility] on [address]

## 2013-05-13 ENCOUNTER — Ambulatory Visit: Payer: BC Managed Care – PPO | Admitting: Family Medicine

## 2013-05-25 ENCOUNTER — Telehealth: Payer: Self-pay | Admitting: Family Medicine

## 2013-05-25 ENCOUNTER — Other Ambulatory Visit: Payer: Self-pay | Admitting: Internal Medicine

## 2013-05-25 ENCOUNTER — Other Ambulatory Visit: Payer: Self-pay | Admitting: Family Medicine

## 2013-05-25 NOTE — Telephone Encounter (Signed)
Patient transferred care and needs refill on Simvastatin and Lipofen sent in to the pharmacy

## 2013-05-26 ENCOUNTER — Telehealth: Payer: Self-pay

## 2013-05-26 DIAGNOSIS — E119 Type 2 diabetes mellitus without complications: Secondary | ICD-10-CM

## 2013-05-26 DIAGNOSIS — E785 Hyperlipidemia, unspecified: Secondary | ICD-10-CM

## 2013-05-26 DIAGNOSIS — I10 Essential (primary) hypertension: Secondary | ICD-10-CM

## 2013-05-26 MED ORDER — SIMVASTATIN 20 MG PO TABS: 20.0000 mg | ORAL_TABLET | Freq: Every day | ORAL | Status: DC

## 2013-05-26 MED ORDER — FENOFIBRATE 150 MG PO CAPS: 1.0000 | ORAL_CAPSULE | Freq: Every day | ORAL | Status: DC

## 2013-05-26 NOTE — Telephone Encounter (Signed)
Message copied by Court Joy on Thu May 26, 2013  2:27 PM ------      Message from: Jama Flavors      Created: Wed May 25, 2013 10:28 AM      Regarding: Medication Refills      Contact: [phone]       Pt's wife called and wanted refills on:      - Simvastatin 20 mg QD      - Lipofen 150 mg QD      - Klonopin 0.5 mg 1 tab tid prn  (Wife stated he has 1 refill left on this but wanted to go ahead and get more refills to save time in the future. I informed her we might not be able to send more refills at this time.)                  [facility] [address]      30 day supplies ------

## 2013-05-27 ENCOUNTER — Other Ambulatory Visit: Payer: Self-pay | Admitting: Family Medicine

## 2013-05-27 NOTE — Telephone Encounter (Signed)
No Klonopin can't be refilled now if there is still a refill. The last refill isn't even until 06-06-13.

## 2013-05-31 ENCOUNTER — Telehealth: Payer: Self-pay

## 2013-05-31 DIAGNOSIS — E785 Hyperlipidemia, unspecified: Secondary | ICD-10-CM

## 2013-05-31 NOTE — Telephone Encounter (Signed)
Patients spouse called stating the the Lipofen is going to be $500. Pts spouse would like to know if something else could be called into the pharmacy?  Please advise?

## 2013-05-31 NOTE — Telephone Encounter (Signed)
OK to d/c Lipofen and start Atorvastatin 10 mg po qhs disp #30 with 3 rf recheck lipids in 3 months.

## 2013-06-01 MED ORDER — ATORVASTATIN CALCIUM 10 MG PO TABS: 10.0000 mg | ORAL_TABLET | Freq: Every day | ORAL | Status: DC

## 2013-06-01 NOTE — Telephone Encounter (Signed)
Notified pt's wife and she voices understanding. Rx sent and lab order entered. Med list updated.

## 2013-06-07 ENCOUNTER — Encounter: Payer: Self-pay | Admitting: Family Medicine

## 2013-06-07 ENCOUNTER — Telehealth: Payer: Self-pay | Admitting: Family Medicine

## 2013-06-07 ENCOUNTER — Ambulatory Visit (INDEPENDENT_AMBULATORY_CARE_PROVIDER_SITE_OTHER): Payer: Commercial Managed Care - HMO | Admitting: Family Medicine

## 2013-06-07 VITALS — BP 142/86 | HR 99 | Temp 98.0°F | Ht 74.0 in | Wt 221.1 lb

## 2013-06-07 DIAGNOSIS — K769 Liver disease, unspecified: Secondary | ICD-10-CM

## 2013-06-07 DIAGNOSIS — Z0000 Encounter for general adult medical examination without abnormal findings: Secondary | ICD-10-CM

## 2013-06-07 DIAGNOSIS — E119 Type 2 diabetes mellitus without complications: Secondary | ICD-10-CM

## 2013-06-07 DIAGNOSIS — IMO0001 Reserved for inherently not codable concepts without codable children: Secondary | ICD-10-CM

## 2013-06-07 DIAGNOSIS — E1165 Type 2 diabetes mellitus with hyperglycemia: Secondary | ICD-10-CM

## 2013-06-07 DIAGNOSIS — R809 Proteinuria, unspecified: Secondary | ICD-10-CM

## 2013-06-07 DIAGNOSIS — I10 Essential (primary) hypertension: Secondary | ICD-10-CM

## 2013-06-07 DIAGNOSIS — I4892 Unspecified atrial flutter: Secondary | ICD-10-CM

## 2013-06-07 DIAGNOSIS — R945 Abnormal results of liver function studies: Secondary | ICD-10-CM

## 2013-06-07 DIAGNOSIS — E785 Hyperlipidemia, unspecified: Secondary | ICD-10-CM

## 2013-06-07 LAB — LIPID PANEL
CHOL/HDL RATIO: 3.8 ratio
Cholesterol: 136 mg/dL (ref 0–200)
HDL: 36 mg/dL — AB (ref >39)
LDL CALC: 42 mg/dL (ref 0–99)
Triglycerides: 290 mg/dL — ABNORMAL HIGH (ref <150)
VLDL: 58 mg/dL — ABNORMAL HIGH (ref 0–40)

## 2013-06-07 LAB — HEPATIC FUNCTION PANEL
ALBUMIN: 4.2 g/dL (ref 3.5–5.2)
ALT: 88 U/L — ABNORMAL HIGH (ref 0–53)
AST: 62 U/L — ABNORMAL HIGH (ref 0–37)
Alkaline Phosphatase: 105 U/L (ref 39–117)
Bilirubin, Direct: 0.1 mg/dL (ref 0.0–0.3)
Indirect Bilirubin: 0.4 mg/dL (ref 0.2–1.2)
TOTAL PROTEIN: 7 g/dL (ref 6.0–8.3)
Total Bilirubin: 0.5 mg/dL (ref 0.2–1.2)

## 2013-06-07 LAB — CBC
HCT: 49.7 % (ref 39.0–52.0)
Hemoglobin: 17.5 g/dL — ABNORMAL HIGH (ref 13.0–17.0)
MCH: 29.9 pg (ref 26.0–34.0)
MCHC: 35.2 g/dL (ref 30.0–36.0)
MCV: 85 fL (ref 78.0–100.0)
PLATELETS: 216 10*3/uL (ref 150–400)
RBC: 5.85 MIL/uL — ABNORMAL HIGH (ref 4.22–5.81)
RDW: 13.3 % (ref 11.5–15.5)
WBC: 10.2 10*3/uL (ref 4.0–10.5)

## 2013-06-07 LAB — HEMOGLOBIN A1C
HEMOGLOBIN A1C: 8.8 % — AB (ref <5.7)
MEAN PLASMA GLUCOSE: 206 mg/dL — AB (ref <117)

## 2013-06-07 LAB — RENAL FUNCTION PANEL
ALBUMIN: 4.2 g/dL (ref 3.5–5.2)
BUN: 10 mg/dL (ref 6–23)
CALCIUM: 9.4 mg/dL (ref 8.4–10.5)
CO2: 24 mEq/L (ref 19–32)
Chloride: 104 mEq/L (ref 96–112)
Creat: 0.88 mg/dL (ref 0.50–1.35)
Glucose, Bld: 223 mg/dL — ABNORMAL HIGH (ref 70–99)
Phosphorus: 2.5 mg/dL (ref 2.3–4.6)
Potassium: 4 mEq/L (ref 3.5–5.3)
SODIUM: 139 meq/L (ref 135–145)

## 2013-06-07 LAB — TSH: TSH: 0.497 u[IU]/mL (ref 0.350–4.500)

## 2013-06-07 NOTE — Patient Instructions (Signed)
Check bp 1-2 x a month, <140/90   DASH Diet The DASH diet stands for "Dietary Approaches to Stop Hypertension." It is a healthy eating plan that has been shown to reduce high blood pressure (hypertension) in as little as 14 days, while also possibly providing other significant health benefits. These other health benefits include reducing the risk of breast cancer after menopause and reducing the risk of type 2 diabetes, heart disease, colon cancer, and stroke. Health benefits also include weight loss and slowing kidney failure in patients with chronic kidney disease.  DIET GUIDELINES  Limit salt (sodium). Your diet should contain less than 1500 mg of sodium daily.  Limit refined or processed carbohydrates. Your diet should include mostly whole grains. Desserts and added sugars should be used sparingly.  Include small amounts of heart-healthy fats. These types of fats include nuts, oils, and tub margarine. Limit saturated and trans fats. These fats have been shown to be harmful in the body. CHOOSING FOODS  The following food groups are based on a 2000 calorie diet. See your Registered Dietitian for individual calorie needs. Grains and Grain Products (6 to 8 servings daily)  Eat More Often: Whole-wheat bread, brown rice, whole-grain or wheat pasta, quinoa, popcorn without added fat or salt (air popped).  Eat Less Often: White bread, white pasta, white rice, cornbread. Vegetables (4 to 5 servings daily)  Eat More Often: Fresh, frozen, and canned vegetables. Vegetables may be raw, steamed, roasted, or grilled with a minimal amount of fat.  Eat Less Often/Avoid: Creamed or fried vegetables. Vegetables in a cheese sauce. Fruit (4 to 5 servings daily)  Eat More Often: All fresh, canned (in natural juice), or frozen fruits. Dried fruits without added sugar. One hundred percent fruit juice ( cup [237 mL] daily).  Eat Less Often: Dried fruits with added sugar. Canned fruit in light or heavy  syrup. [employer], Fish, and Poultry (2 servings or less daily. One serving is 3 to 4 oz [85-114 g]).  Eat More Often: Ninety percent or leaner ground beef, tenderloin, sirloin. Round cuts of beef, chicken breast, [place] breast. All fish. Grill, bake, or broil your meat. Nothing should be fried.  Eat Less Often/Avoid: Fatty cuts of meat, [place], or chicken leg, thigh, or wing. Fried cuts of meat or fish. Dairy (2 to 3 servings)  Eat More Often: Low-fat or fat-free milk, low-fat plain or light yogurt, reduced-fat or part-skim cheese.  Eat Less Often/Avoid: Milk (whole, 2%).Whole milk yogurt. Full-fat cheeses. Nuts, Seeds, and Legumes (4 to 5 servings per week)  Eat More Often: All without added salt.  Eat Less Often/Avoid: Salted nuts and seeds, canned beans with added salt. Fats and Sweets (limited)  Eat More Often: Vegetable oils, tub margarines without trans fats, sugar-free gelatin. Mayonnaise and salad dressings.  Eat Less Often/Avoid: Coconut oils, palm oils, butter, stick margarine, cream, half and half, cookies, candy, pie. FOR MORE INFORMATION The Dash Diet Eating Plan: www.dashdiet.org Document Released: 12/19/2010 Document Revised: 03/24/2011 Document Reviewed: 12/19/2010 [facility] Patient Information 2014 [place], [place].

## 2013-06-07 NOTE — Telephone Encounter (Signed)
LAB ORDER WEEK OF 8-16-20Return in about 3 months (around 09/07/2013) for lipid, renal, cbc, tsh, hepatic, hgba1c prior. Diagnostic follow-up:  15

## 2013-06-07 NOTE — Progress Notes (Signed)
Pre visit review using our clinic review tool, if applicable. No additional management support is needed unless otherwise documented below in the visit note. 

## 2013-06-08 LAB — MICROALBUMIN / CREATININE URINE RATIO
CREATININE, URINE: 85.4 mg/dL
Microalb Creat Ratio: 95.1 mg/g — ABNORMAL HIGH (ref 0.0–30.0)
Microalb, Ur: 8.12 mg/dL — ABNORMAL HIGH (ref 0.00–1.89)

## 2013-06-08 MED ORDER — ATORVASTATIN CALCIUM 20 MG PO TABS: 20.0000 mg | ORAL_TABLET | Freq: Every day | ORAL | Status: DC

## 2013-06-08 NOTE — Addendum Note (Signed)
Addended by: Court Joy on: 06/08/2013 08:07 AM   Modules accepted: Orders, Medications

## 2013-06-12 ENCOUNTER — Encounter: Payer: Self-pay | Admitting: Family Medicine

## 2013-06-12 DIAGNOSIS — Z0000 Encounter for general adult medical examination without abnormal findings: Secondary | ICD-10-CM | POA: Insufficient documentation

## 2013-06-12 NOTE — Assessment & Plan Note (Addendum)
hgba1c not acceptable minimize simple carbs. Increase exercise as tolerated. Was not taking current meds. Will start Metformin and he is asked to check his sugars daily and prn. It is unclear if he will

## 2013-06-12 NOTE — Assessment & Plan Note (Signed)
RRR today 

## 2013-06-12 NOTE — Assessment & Plan Note (Signed)
Tolerating statin, encouraged heart healthy diet, avoid trans fats, minimize simple carbs and saturated fats. Increase exercise as tolerated 

## 2013-06-12 NOTE — Assessment & Plan Note (Signed)
Patient denies any difficulties at home. No trouble with ADLs, depression or falls. No recent changes to vision or hearing. Is UTD with immunizations. Is UTD with screening. Discussed Advanced Directives, patient agrees to bring us copies of documents if can. Encouraged heart healthy diet, exercise as tolerated and adequate sleep 

## 2013-06-12 NOTE — Assessment & Plan Note (Signed)
Well controlled, no changes to meds. Encouraged heart healthy diet such as the DASH diet and exercise as tolerated.  °

## 2013-06-12 NOTE — Progress Notes (Signed)
Patient ID: Larry Rangel, male   DOB: 27-Jul-1950, 63 y.o.   MRN: [id number] Larry MonarchRandall L Rangel [id number] 27-Jul-1950 06/12/2013      Progress Note-Follow Up  Subjective  Chief Complaint  Chief Complaint  Patient presents with  . Annual Exam    medicare wellness    HPI  Patient is a 63 year old male in today for routine medical care. He is in today for his initial Medicare visit. He has just retired from work and reports he feels well. He acknowledges he is not following a diabetic diet. He is not taking her diabetes medication he was prescribed and says he never did. He denies polyuria or polydipsia. He is eating a significant amount of carbs. No recent illness. Denies CP/palp/SOB/HA/congestion/fevers/GI or GU c/o. Taking meds as prescribed  Past Medical History  Diagnosis Date  . Anxiety   . Hypertension   . Low back pain   . Tobacco user   . CAD (coronary artery disease) of artery bypass graft   . Hyperlipidemia   . S/P colonoscopy   . Cardiomyopathy     Echo 6/14: Mild LVH, EF 30-35%, diffuse HK, MAC, mild BAE  . Shortness of breath   . Myocardial infarction   . Arthritis   . Atrial flutter 07-2012    s/p ablation by Dr Graciela HusbandsKlein 08-06-2012  . Chicken pox as a child  . Measles as a child  . Mumps as a child  . Abnormal liver function 08/23/2010  . Medicare welcome exam 06/12/2013    Past Surgical History  Procedure Laterality Date  . Shoulder surgery    . Coronary artery bypass graft  2009    x 5  . Tee without cardioversion N/A 07/15/2012    Procedure: TRANSESOPHAGEAL ECHOCARDIOGRAM (TEE);  Surgeon: Vesta MixerPhilip J Nahser, MD;  [facility];  Service: Cardiovascular;  Laterality: N/A;  . Cardioversion N/A 07/15/2012    Procedure: CARDIOVERSION;  Surgeon: Vesta MixerPhilip J Nahser, MD;  [facility];  Service: Cardiovascular;  Laterality: N/A;  . Ablation of dysrhythmic focus  08/06/2012    CTI ablation by Dr Graciela HusbandsKlein  . Tonsillectomy      Family History  Problem  Relation Age of Onset  . Alzheimer's disease Mother   . Heart failure Father [id number]  . Hypertension Father   . Hyperlipidemia Father   . Cancer Father     lung- took half of a young- smoker  . Heart disease Father     MI at [id number]  . Early death Neg Hx   . Kidney disease Neg Hx   . Stroke Neg Hx   . Leukemia Brother   . Drug abuse Daughter     heroine    History   Social History  . Marital Status: Married    Spouse Name: N/A    Number of Children: N/A  . Years of Education: N/A   Occupational History  . Not on file.   Social History Main Topics  . Smoking status: Former Smoker -- 1.00 packs/day for 40 years    Types: Cigarettes    Quit date: 02/01/2011  . Smokeless tobacco: Never Used  . Alcohol Use: 14.4 oz/week    24 Glasses of wine per week     Comment: drinks 2 bottles of wine per week  . Drug Use: No  . Sexual Activity: Yes   Other Topics Concern  . 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   He has been married four times previously, the first  For 76yrs and has two children by the marriage which are grown, the second time for 11 yrs and has 2 by that marriage.   Wife has custody in [place].   The third marriage was 4 1/2 yrs and most recent marriage was the past eight weeks and he is currently estranged from that person.    Current Outpatient Prescriptions on File Prior to Visit  Medication Sig Dispense Refill  . amLODipine-valsartan (EXFORGE) 5-320 MG per tablet       . aspirin EC 81 MG tablet Take 1 tablet (81 mg total) by mouth daily.      . busPIRone (BUSPAR) 10 MG tablet Take 1 tablet (10 mg total) by mouth 2 (two) times daily.  180 tablet  1  . clonazePAM (KLONOPIN) 0.5 MG tablet Take 1 tablet (0.5 mg total) by mouth 3 (three) times daily as needed for anxiety. Or as needed  90 tablet  2  . cyclobenzaprine (FLEXERIL)  10 MG tablet TAKE 1 TABLET BY MOUTH 3 TIMES A DAY AS NEEDED  90 tablet  3  . Omega-3 Fatty Acids (FISH OIL PO) Take 1 capsule by mouth daily.       Marland Kitchen POTASSIUM PO Take 1 tablet by mouth daily. otc - pt does not know strength       No current facility-administered medications on file prior to visit.    No Known Allergies  Review of Systems  Review of Systems  Constitutional: Negative for fever, chills and malaise/fatigue.  HENT: Positive for sore throat. Negative for congestion, hearing loss, nosebleeds and tinnitus.   Eyes: Negative for discharge.  Respiratory: Negative for cough, sputum production, shortness of breath and wheezing.   Cardiovascular: Negative for chest pain, palpitations and leg swelling.  Gastrointestinal: Negative for heartburn, nausea, vomiting, abdominal pain, diarrhea, constipation and blood in stool.  Genitourinary: Negative for dysuria, urgency, frequency and hematuria.  Musculoskeletal: Negative for back pain, falls and myalgias.  Skin: Negative for rash.  Neurological: Negative for dizziness, tremors, sensory change, focal weakness, loss of consciousness, weakness and headaches.  Endo/Heme/Allergies: Negative for polydipsia. Does not bruise/bleed easily.  Psychiatric/Behavioral: Negative for depression and suicidal ideas. The patient is not nervous/anxious and does not have insomnia.     Objective  BP 148/88  Pulse 99  Temp(Src) 98 F (36.7 C) (Oral)  Ht 6\' 2"  (1.88 m)  Wt 221 lb 0.8 oz (100.268 kg)  BMI 28.37 kg/m2  SpO2 96%  Physical Exam  Physical Exam  Constitutional: He is oriented to person, place, and time and well-developed, well-nourished, and in no distress. No distress.  HENT:  Head: Normocephalic and atraumatic.  Eyes: Conjunctivae are normal.  Neck: Neck supple. No thyromegaly present.  Cardiovascular: Normal rate, regular rhythm and normal heart sounds.   No murmur heard. Pulmonary/Chest: Effort normal and breath sounds normal. No  respiratory distress.  Abdominal: He exhibits no distension and no mass. There is no tenderness.  Genitourinary: Rectum normal.  Musculoskeletal: He exhibits no edema.  Neurological: He is alert and oriented to person, place, and time.  Skin: Skin is warm.  Psychiatric: Memory, affect and judgment normal.    Lab Results  Component Value Date   TSH 0.497 06/07/2013   Lab Results  Component Value Date   WBC 10.2 06/07/2013   HGB 17.5* 06/07/2013  HCT 49.7 06/07/2013   MCV 85.0 06/07/2013   PLT 216 06/07/2013   Lab Results  Component Value Date   CREATININE 0.88 06/07/2013   BUN 10 06/07/2013   NA 139 06/07/2013   K 4.0 06/07/2013   CL 104 06/07/2013   CO2 24 06/07/2013   Lab Results  Component Value Date   ALT 88* 06/07/2013   AST 62* 06/07/2013   ALKPHOS 105 06/07/2013   BILITOT 0.5 06/07/2013   Lab Results  Component Value Date   CHOL 136 06/07/2013   Lab Results  Component Value Date   HDL 36* 06/07/2013   Lab Results  Component Value Date   LDLCALC 42 06/07/2013   Lab Results  Component Value Date   TRIG 290* 06/07/2013   Lab Results  Component Value Date   CHOLHDL 3.8 06/07/2013     Assessment & Plan  HYPERTENSION, UNSPECIFIED Well controlled, no changes to meds. Encouraged heart healthy diet such as the DASH diet and exercise as tolerated.   HYPERLIPIDEMIA-MIXED Tolerating statin, encouraged heart healthy diet, avoid trans fats, minimize simple carbs and saturated fats. Increase exercise as tolerated  Type II or unspecified type diabetes mellitus without mention of complication, uncontrolled hgba1c not acceptable minimize simple carbs. Increase exercise as tolerated. Was not taking current meds. Will start Metformin and he is asked to check his sugars daily and prn. It is unclear if he will  Microalbuminuria Is on Losartan  Atrial flutter RRR today  Abnormal liver function Slightly improved, minimize simple carbs and transa fats  Medicare welcome  exam Patient denies any difficulties at home. No trouble with ADLs, depression or falls. No recent changes to vision or hearing. Is UTD with immunizations. Is UTD with screening. Discussed Advanced Directives, patient agrees to bring [place] copies of documents if can. Encouraged heart healthy diet, exercise as tolerated and adequate sleep

## 2013-06-12 NOTE — Assessment & Plan Note (Signed)
Slightly improved, minimize simple carbs and transa fats

## 2013-06-12 NOTE — Assessment & Plan Note (Signed)
Is on Losartan

## 2013-06-23 ENCOUNTER — Other Ambulatory Visit: Payer: Self-pay

## 2013-06-23 MED ORDER — SIMVASTATIN 20 MG PO TABS: 20.0000 mg | ORAL_TABLET | Freq: Every day | ORAL | Status: DC

## 2013-06-23 MED ORDER — ATORVASTATIN CALCIUM 20 MG PO TABS: 20.0000 mg | ORAL_TABLET | Freq: Every day | ORAL | Status: DC

## 2013-07-04 ENCOUNTER — Telehealth: Payer: Self-pay | Admitting: *Deleted

## 2013-07-04 ENCOUNTER — Encounter: Payer: Self-pay | Admitting: Family Medicine

## 2013-07-04 ENCOUNTER — Other Ambulatory Visit: Payer: Self-pay | Admitting: Family Medicine

## 2013-07-04 NOTE — Telephone Encounter (Signed)
LMOM with contact name and number for return call RE: clarification on if pt is taking Simvastatin or Atorvastatin/SLS

## 2013-07-06 ENCOUNTER — Other Ambulatory Visit: Payer: Self-pay | Admitting: Family Medicine

## 2013-07-06 NOTE — Telephone Encounter (Signed)
Refill request for clonazepam Last filled by MD on- 03/09/2013 #90 x2 Last Appt:06/07/2013 Next Appt:09/05/2013 Please advise refill?

## 2013-07-06 NOTE — Telephone Encounter (Signed)
Rx request faxed to pharmacy/SLS  

## 2013-07-08 NOTE — Telephone Encounter (Signed)
I am having the same problem. Somehow when he started here we crossed wires about which one he is taking. So If I am deciding I want him to stay on the Atorvastatin same strength. If he says he has a lot of the Simvastatin he is welcome to use that up by taking one a day til gone and then starting back on the Atorvasatin. We have been trying to get this straightened out all week. Please update MAR

## 2013-07-08 NOTE — Telephone Encounter (Signed)
Left a detailed message for patient with md's response and to call me back to let me know he got the message.

## 2013-07-08 NOTE — Telephone Encounter (Signed)
Patient left a message on my vm stating that he was prescribed both Simvastatin and Atorvastatin. Pt would like to know which medication he is supposed to be taking?  Please advise?

## 2013-07-08 NOTE — Telephone Encounter (Signed)
LMOM [2nd] with contact name and number for return call RE: clarification on which cholesterol medication pt is currently taking for refill request/SLS

## 2013-07-26 ENCOUNTER — Encounter: Payer: Self-pay | Admitting: Family Medicine

## 2013-07-29 ENCOUNTER — Other Ambulatory Visit: Payer: Self-pay | Admitting: Internal Medicine

## 2013-07-29 NOTE — Telephone Encounter (Signed)
Pt see Dr. Abner Greenspan forwarding to [place] [facility]...Raechel Chute

## 2013-07-30 ENCOUNTER — Inpatient Hospital Stay (HOSPITAL_COMMUNITY): Payer: Medicare HMO

## 2013-07-30 ENCOUNTER — Encounter (HOSPITAL_COMMUNITY): Payer: Self-pay | Admitting: Cardiology

## 2013-07-30 ENCOUNTER — Inpatient Hospital Stay (HOSPITAL_COMMUNITY)
Admission: AD | Admit: 2013-07-30 | Discharge: 2013-08-04 | DRG: 287 | Disposition: A | Discharge status: Home / Self Care | Payer: Medicare HMO | Source: Other Acute Inpatient Hospital | Attending: Internal Medicine | Admitting: Internal Medicine

## 2013-07-30 DIAGNOSIS — I5023 Acute on chronic systolic (congestive) heart failure: Secondary | ICD-10-CM

## 2013-07-30 DIAGNOSIS — I483 Typical atrial flutter: Secondary | ICD-10-CM

## 2013-07-30 DIAGNOSIS — R809 Proteinuria, unspecified: Secondary | ICD-10-CM

## 2013-07-30 DIAGNOSIS — I25700 Atherosclerosis of coronary artery bypass graft(s), unspecified, with unstable angina pectoris: Secondary | ICD-10-CM

## 2013-07-30 DIAGNOSIS — I447 Left bundle-branch block, unspecified: Secondary | ICD-10-CM | POA: Diagnosis present

## 2013-07-30 DIAGNOSIS — R945 Abnormal results of liver function studies: Secondary | ICD-10-CM

## 2013-07-30 DIAGNOSIS — E785 Hyperlipidemia, unspecified: Secondary | ICD-10-CM | POA: Diagnosis present

## 2013-07-30 DIAGNOSIS — I5043 Acute on chronic combined systolic (congestive) and diastolic (congestive) heart failure: Secondary | ICD-10-CM | POA: Diagnosis present

## 2013-07-30 DIAGNOSIS — Z8249 Family history of ischemic heart disease and other diseases of the circulatory system: Secondary | ICD-10-CM

## 2013-07-30 DIAGNOSIS — IMO0001 Reserved for inherently not codable concepts without codable children: Secondary | ICD-10-CM | POA: Diagnosis present

## 2013-07-30 DIAGNOSIS — I441 Atrioventricular block, second degree: Secondary | ICD-10-CM | POA: Diagnosis present

## 2013-07-30 DIAGNOSIS — Z7982 Long term (current) use of aspirin: Secondary | ICD-10-CM

## 2013-07-30 DIAGNOSIS — E1159 Type 2 diabetes mellitus with other circulatory complications: Secondary | ICD-10-CM

## 2013-07-30 DIAGNOSIS — I248 Other forms of acute ischemic heart disease: Secondary | ICD-10-CM | POA: Diagnosis present

## 2013-07-30 DIAGNOSIS — E1165 Type 2 diabetes mellitus with hyperglycemia: Secondary | ICD-10-CM

## 2013-07-30 DIAGNOSIS — I4891 Unspecified atrial fibrillation: Secondary | ICD-10-CM | POA: Diagnosis present

## 2013-07-30 DIAGNOSIS — I480 Paroxysmal atrial fibrillation: Secondary | ICD-10-CM

## 2013-07-30 DIAGNOSIS — I10 Essential (primary) hypertension: Secondary | ICD-10-CM | POA: Diagnosis present

## 2013-07-30 DIAGNOSIS — I2510 Atherosclerotic heart disease of native coronary artery without angina pectoris: Secondary | ICD-10-CM | POA: Diagnosis present

## 2013-07-30 DIAGNOSIS — I509 Heart failure, unspecified: Secondary | ICD-10-CM

## 2013-07-30 DIAGNOSIS — F17200 Nicotine dependence, unspecified, uncomplicated: Secondary | ICD-10-CM | POA: Diagnosis present

## 2013-07-30 DIAGNOSIS — R0902 Hypoxemia: Secondary | ICD-10-CM | POA: Diagnosis present

## 2013-07-30 DIAGNOSIS — I4892 Unspecified atrial flutter: Secondary | ICD-10-CM

## 2013-07-30 DIAGNOSIS — I252 Old myocardial infarction: Secondary | ICD-10-CM | POA: Diagnosis not present

## 2013-07-30 DIAGNOSIS — I25810 Atherosclerosis of coronary artery bypass graft(s) without angina pectoris: Secondary | ICD-10-CM

## 2013-07-30 DIAGNOSIS — K769 Liver disease, unspecified: Secondary | ICD-10-CM

## 2013-07-30 DIAGNOSIS — F411 Generalized anxiety disorder: Secondary | ICD-10-CM | POA: Diagnosis present

## 2013-07-30 DIAGNOSIS — I2489 Other forms of acute ischemic heart disease: Secondary | ICD-10-CM | POA: Diagnosis present

## 2013-07-30 DIAGNOSIS — I498 Other specified cardiac arrhythmias: Secondary | ICD-10-CM | POA: Diagnosis present

## 2013-07-30 DIAGNOSIS — I428 Other cardiomyopathies: Secondary | ICD-10-CM

## 2013-07-30 DIAGNOSIS — E1169 Type 2 diabetes mellitus with other specified complication: Secondary | ICD-10-CM | POA: Diagnosis present

## 2013-07-30 DIAGNOSIS — I429 Cardiomyopathy, unspecified: Secondary | ICD-10-CM

## 2013-07-30 DIAGNOSIS — E669 Obesity, unspecified: Secondary | ICD-10-CM | POA: Diagnosis present

## 2013-07-30 HISTORY — DX: Heart failure, unspecified: I50.9

## 2013-07-30 LAB — GLUCOSE, CAPILLARY
GLUCOSE-CAPILLARY: 185 mg/dL — AB (ref 70–99)
Glucose-Capillary: 179 mg/dL — ABNORMAL HIGH (ref 70–99)

## 2013-07-30 LAB — TROPONIN I: Troponin I: 1.37 ng/mL (ref <0.30)

## 2013-07-30 LAB — HEMOGLOBIN A1C
Hgb A1c MFr Bld: 8 % — ABNORMAL HIGH (ref <5.7)
Mean Plasma Glucose: 183 mg/dL — ABNORMAL HIGH (ref <117)

## 2013-07-30 LAB — MRSA PCR SCREENING: MRSA by PCR: NEGATIVE

## 2013-07-30 MED ORDER — ASPIRIN EC 81 MG PO TBEC: 81.0000 mg | DELAYED_RELEASE_TABLET | Freq: Every day | ORAL | Status: DC

## 2013-07-30 MED ORDER — CLONAZEPAM 0.5 MG PO TABS
0.5000 mg | ORAL_TABLET | Freq: Two times a day (BID) | ORAL | Status: DC | PRN
  Administered 2013-07-30 – 2013-08-04 (×9): 0.5 mg via ORAL
  Filled 2013-07-30 (×11): qty 1

## 2013-07-30 MED ORDER — OMEGA-3-ACID ETHYL ESTERS 1 G PO CAPS
1.0000 g | ORAL_CAPSULE | Freq: Every day | ORAL | Status: DC
  Administered 2013-08-01 – 2013-08-04 (×4): 1 g via ORAL
  Filled 2013-07-30 (×5): qty 1

## 2013-07-30 MED ORDER — ASPIRIN EC 81 MG PO TBEC
81.0000 mg | DELAYED_RELEASE_TABLET | Freq: Every day | ORAL | Status: DC
  Administered 2013-07-31 – 2013-08-04 (×5): 81 mg via ORAL
  Filled 2013-07-30 (×6): qty 1

## 2013-07-30 MED ORDER — CYCLOBENZAPRINE HCL 10 MG PO TABS
5.0000 mg | ORAL_TABLET | Freq: Three times a day (TID) | ORAL | Status: DC | PRN
  Administered 2013-07-31 – 2013-08-03 (×5): 5 mg via ORAL
  Filled 2013-07-30 (×5): qty 1

## 2013-07-30 MED ORDER — INSULIN ASPART 100 UNIT/ML ~~LOC~~ SOLN
0.0000 [IU] | Freq: Every day | SUBCUTANEOUS | Status: DC
  Administered 2013-08-02: 2 [IU] via SUBCUTANEOUS

## 2013-07-30 MED ORDER — CARVEDILOL 3.125 MG PO TABS
3.1250 mg | ORAL_TABLET | Freq: Two times a day (BID) | ORAL | Status: DC
  Administered 2013-07-30 – 2013-08-01 (×5): 3.125 mg via ORAL
  Filled 2013-07-30 (×8): qty 1

## 2013-07-30 MED ORDER — HEPARIN (PORCINE) IN NACL 100-0.45 UNIT/ML-% IJ SOLN
1900.0000 [IU]/h | INTRAMUSCULAR | Status: DC
  Administered 2013-07-30 (×2): 1400 [IU]/h via INTRAVENOUS
  Administered 2013-07-31: 1800 [IU]/h via INTRAVENOUS
  Administered 2013-07-31: 1500 [IU]/h via INTRAVENOUS
  Filled 2013-07-30 (×7): qty 250

## 2013-07-30 MED ORDER — INSULIN ASPART 100 UNIT/ML ~~LOC~~ SOLN
0.0000 [IU] | Freq: Three times a day (TID) | SUBCUTANEOUS | Status: DC
  Administered 2013-07-30 – 2013-08-02 (×5): 3 [IU] via SUBCUTANEOUS
  Administered 2013-08-02 – 2013-08-04 (×4): 2 [IU] via SUBCUTANEOUS

## 2013-07-30 MED ORDER — POTASSIUM CHLORIDE CRYS ER 20 MEQ PO TBCR
20.0000 meq | EXTENDED_RELEASE_TABLET | Freq: Every day | ORAL | Status: DC
  Administered 2013-08-01 – 2013-08-04 (×3): 20 meq via ORAL
  Filled 2013-07-30 (×6): qty 1

## 2013-07-30 MED ORDER — HEPARIN BOLUS VIA INFUSION
4000.0000 [IU] | Freq: Once | INTRAVENOUS | Status: AC
  Administered 2013-07-30: 4000 [IU] via INTRAVENOUS
  Filled 2013-07-30: qty 4000

## 2013-07-30 MED ORDER — ALPRAZOLAM 0.25 MG PO TABS
0.2500 mg | ORAL_TABLET | Freq: Two times a day (BID) | ORAL | Status: DC | PRN
  Administered 2013-07-30 – 2013-08-03 (×6): 0.25 mg via ORAL
  Filled 2013-07-30 (×5): qty 1

## 2013-07-30 MED ORDER — IOHEXOL 350 MG/ML SOLN
80.0000 mL | Freq: Once | INTRAVENOUS | Status: AC | PRN
  Administered 2013-07-30: 80 mL via INTRAVENOUS

## 2013-07-30 MED ORDER — ACETAMINOPHEN 325 MG PO TABS: 650.0000 mg | ORAL_TABLET | ORAL | Status: DC | PRN

## 2013-07-30 MED ORDER — DILTIAZEM LOAD VIA INFUSION
10.0000 mg | Freq: Once | INTRAVENOUS | Status: AC
  Administered 2013-07-30: 10 mg via INTRAVENOUS
  Filled 2013-07-30: qty 10

## 2013-07-30 MED ORDER — ONDANSETRON HCL 4 MG/2ML IJ SOLN: 4.0000 mg | Freq: Four times a day (QID) | INTRAMUSCULAR | Status: DC | PRN

## 2013-07-30 MED ORDER — ATORVASTATIN CALCIUM 20 MG PO TABS
20.0000 mg | ORAL_TABLET | Freq: Every day | ORAL | Status: DC
  Administered 2013-07-30 – 2013-08-03 (×5): 20 mg via ORAL
  Filled 2013-07-30 (×8): qty 1

## 2013-07-30 MED ORDER — FUROSEMIDE 10 MG/ML IJ SOLN
40.0000 mg | Freq: Two times a day (BID) | INTRAMUSCULAR | Status: DC
  Administered 2013-07-30 – 2013-08-03 (×8): 40 mg via INTRAVENOUS
  Filled 2013-07-30 (×9): qty 4

## 2013-07-30 MED ORDER — BUSPIRONE HCL 10 MG PO TABS
10.0000 mg | ORAL_TABLET | Freq: Two times a day (BID) | ORAL | Status: DC
  Administered 2013-07-30 – 2013-08-04 (×10): 10 mg via ORAL
  Filled 2013-07-30 (×14): qty 1

## 2013-07-30 MED ORDER — IRBESARTAN 300 MG PO TABS
300.0000 mg | ORAL_TABLET | Freq: Every day | ORAL | Status: DC
  Administered 2013-07-30 – 2013-08-04 (×6): 300 mg via ORAL
  Filled 2013-07-30 (×7): qty 1

## 2013-07-30 MED ORDER — DILTIAZEM HCL 100 MG IV SOLR
5.0000 mg/h | INTRAVENOUS | Status: DC
  Administered 2013-07-30 – 2013-08-01 (×4): 5 mg/h via INTRAVENOUS
  Filled 2013-07-30 (×2): qty 100

## 2013-07-30 MED ORDER — NITROGLYCERIN 0.4 MG SL SUBL: 0.4000 mg | SUBLINGUAL_TABLET | SUBLINGUAL | Status: DC | PRN

## 2013-07-30 MED ORDER — ZOLPIDEM TARTRATE 5 MG PO TABS: 5.0000 mg | ORAL_TABLET | Freq: Every evening | ORAL | Status: DC | PRN

## 2013-07-30 NOTE — Progress Notes (Signed)
    SUBJECTIVE:  Asked to see patient complaining of SOB.  He is not having any pain.  Sats are 97% on 4 liters.     PHYSICAL EXAM Filed Vitals:   07/30/13 1500 07/30/13 1600 07/30/13 1700  BP: 121/89 119/98 121/104  Pulse:  109 110  Temp: 98.3 F (36.8 C)    TempSrc: Oral    Resp: 25 25 30   Height: 6' 2.02" (1.88 m)    Weight: 221 lb 1.9 oz (100.3 kg)    SpO2:  97% 98%   General:  No acute distress.  Mild diaphoresis.   Lungs:  Decreased breath sounds right base Heart:  RRR Extremities:  Trace edema   LABS: Lab Results  Component Value Date   TROPONINI 1.37* 07/30/2013   Results for orders placed during the hospital encounter of 07/30/13 (from the past 24 hour(s))  MRSA PCR SCREENING     Status: None   Collection Time    07/30/13  3:48 PM      Result Value Ref Range   MRSA by PCR NEGATIVE  NEGATIVE  TROPONIN I     Status: Abnormal   Collection Time    07/30/13  5:00 PM      Result Value Ref Range   Troponin I 1.37 (*) <0.30 ng/mL  GLUCOSE, CAPILLARY     Status: Abnormal   Collection Time    07/30/13  6:31 PM      Result Value Ref Range   Glucose-Capillary 179 (*) 70 - 99 mg/dL    Intake/Output Summary (Last 24 hours) at 07/30/13 2006 Last data filed at 07/30/13 1700  Gross per 24 hour  Intake    120 ml  Output      0 ml  Net    120 ml    ASSESSMENT AND PLAN:  Active Problems:   Acute on chronic systolic CHF (congestive heart failure)  He denies any pain.  Troponin is up slightly.  NSR, sinus tachy with LBBB, PACs.  I/O are incomplete but he reports that he is urinating quite a bit.   I will continue the current therapy.  He can get Klonopin now.  He wants to take his Buspar early.    Fayrene Fearing [facility] 07/30/2013 8:06 PM

## 2013-07-30 NOTE — Progress Notes (Signed)
CRITICAL VALUE ALERT  Critical value received:  Troponin 1.37 Date of notification:  07/30/2013  Time of notification:  1805  Critical value read back:yes  Nurse who received alert:  Delanna Notice  MD notified (1st page):  Dr. Antoine Poche  Time of first page: 1840  MD notified (2nd page):  Time of second page:  Responding MD:  Dr. Antoine Poche Time MD responded:  1910

## 2013-07-30 NOTE — H&P (Addendum)
Admit date: 07/30/2013 Referring Physician:  Dr. Fonnie Jarvis Primary Cardiologist:  Dr. Graciela Husbands Chief complaint/reason for admission:SOB/CHF/aflutter with RVR  HPI: This is a 63yo WM with a history of ASCAD s/p CABG in 2009, dyslipidemia, HTN, Ischemic DCM EF 30-35% by echo 6/14, atrial flutter s/p flutter ablation 7/14 who was in his USOH until a few days ago when he started developing SOB.  He says a week ago he was able to swim across his pool under water without any problems.  On Wed evening he awakened with severe SOB and had to get out of bed. He as diaphoretic.  The same thing happened Thursday night as well.  During the day he was fine.  Yesterday during the day he had intermittent DOE and then last PM awakened again with severe SOB and had to get out of bed.  He presented to [facility] ER today and was found to be in atrial flutter with RVR and CHF.  He says that he has had some chest tightness but not sure it is like what he had with his angina in the past.  He has not been very compliant with low sodium or low fat diet.  He denies any palpitations or LE edema.  He is now transferred to [facility] for further evaluation.  He was noted to have an elevated troponin at 0.085, CPK 136 and MB 5.16 and D-Dimer is elevated at 0.8.  BNP was noted to be elevated at 1567.  Creatinine normal at 0.78, AST elevated at 56 and ALT 71.  BS nonfasting elevated at 260.  Chest xray showed bilateral edema vs. Infiltrate.  CBC is elevated at 10.9.  He denies any fever.  He says he has been working a lot out in the heat this week.      PMH:    Past Medical History  Diagnosis Date  . Anxiety   . Hypertension   . Low back pain   . Tobacco user   . CAD (coronary artery disease) of artery bypass graft   . Hyperlipidemia   . S/P colonoscopy   . Cardiomyopathy     Echo 6/14: Mild LVH, EF 30-35%, diffuse HK, MAC, mild BAE  . Shortness of breath   . Myocardial infarction   . Arthritis   . Atrial flutter 07-2012    s/p  ablation by Dr Graciela Husbands 08-06-2012  . Chicken pox as a child  . Measles as a child  . Mumps as a child  . Abnormal liver function 08/23/2010  . Medicare welcome exam 06/12/2013    PSH:    Past Surgical History  Procedure Laterality Date  . Shoulder surgery    . Coronary artery bypass graft  2009    x 5  . Tee without cardioversion N/A 07/15/2012    Procedure: TRANSESOPHAGEAL ECHOCARDIOGRAM (TEE);  Surgeon: Vesta Mixer, MD;  Location: [facility] ENDOSCOPY;  Service: Cardiovascular;  Laterality: N/A;  . Cardioversion N/A 07/15/2012    Procedure: CARDIOVERSION;  Surgeon: Vesta Mixer, MD;  Location: [facility] ENDOSCOPY;  Service: Cardiovascular;  Laterality: N/A;  . Ablation of dysrhythmic focus  08/06/2012    CTI ablation by Dr Graciela Husbands  . Tonsillectomy    . Cardiac catheterization      ALLERGIES:   Review of patient's allergies indicates no known allergies.  Prior to Admit Meds:   Prescriptions prior to admission  Medication Sig Dispense Refill  . amLODipine-valsartan (EXFORGE) 5-320 MG per tablet       . aspirin EC  81 MG tablet Take 1 tablet (81 mg total) by mouth daily.      Marland Kitchen. atorvastatin (LIPITOR) 20 MG tablet Take 1 tablet (20 mg total) by mouth daily.  90 tablet  1  . busPIRone (BUSPAR) 10 MG tablet Take 1 tablet (10 mg total) by mouth 2 (two) times daily.  180 tablet  1  . clonazePAM (KLONOPIN) 0.5 MG tablet TAKE 1 TABLET BY MOUTH 3 TIMES A DAY AS NEEDED FOR ANXIETY  90 tablet  2  . cyclobenzaprine (FLEXERIL) 10 MG tablet TAKE 1 TABLET BY MOUTH 3 TIMES A DAY AS NEEDED  90 tablet  3  . Omega-3 Fatty Acids (FISH OIL PO) Take 1 capsule by mouth daily.       Marland Kitchen. POTASSIUM PO Take 1 tablet by mouth daily. otc - pt does not know strength       Family HX:    Family History  Problem Relation Age of Onset  . Alzheimer's disease Mother   . Heart failure Father [id number]  . Hypertension Father   . Hyperlipidemia Father   . Cancer Father     lung- took half of a young- smoker  . Heart disease Father     MI  at [id number]  . Early death Neg Hx   . Kidney disease Neg Hx   . Stroke Neg Hx   . Leukemia Brother   . Drug abuse Daughter     heroine   Social HX:    History   Social History  . Marital Status: Married    Spouse Name: N/A    Number of Children: N/A  . Years of Education: N/A   Occupational History  . Not on file.   Social History Main Topics  . Smoking status: Former Smoker -- 1.00 packs/day for 40 years    Types: Cigarettes    Quit date: 02/01/2011  . Smokeless tobacco: Never Used  . Alcohol Use: 14.4 oz/week    24 Glasses of wine per week     Comment: drinks 2 bottles of wine per week  . Drug Use: No  . Sexual Activity: Yes   Other Topics Concern  . 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   He has been married four times previously, the first  For 4020yrs and has two children by the marriage which are grown, the second time for 11 yrs and has 2 by that marriage.   [place].   The third marriage was 4 1/2 yrs and most recent marriage was the past eight weeks and he is currently estranged from that person.     ROS:  All 11 ROS were addressed and are negative except what is stated in the HPI  PHYSICAL EXAM There were no vitals filed for this visit. General: Well developed, well nourished, in no acute distress Head: Eyes PERRLA, No xanthomas.   Normal cephalic and atramatic  Lungs:   Clear bilaterally to auscultation and percussion. Heart:   Irregularly irregular and tachy S1 S2 Pulses are 2+ & equal.  Abdomen with increased girth and tight to palpation.            No carotid bruit. No JVD.  No abdominal bruits. No femoral bruits. Abdomen: Bowel sounds are positive, abdomen soft and non-tender without masses  Extremities:   No  clubbing, cyanosis or edema.  DP +1 Neuro: Alert and oriented X 3. Psych:  Good affect,  responds appropriately   Labs:   Lab Results  Component Value Date   WBC 10.2 06/07/2013   HGB 17.5* 06/07/2013   HCT 49.7 06/07/2013   MCV 85.0 06/07/2013   PLT 216 06/07/2013   No results found for this basename: NA, K, CL, CO2, BUN, CREATININE, CALCIUM, LABALBU, PROT, BILITOT, ALKPHOS, ALT, AST, GLUCOSE,  in the last 168 hours Lab Results  Component Value Date   CKTOTAL 136 04/16/2012   No results found for this basename: PTT   Lab Results  Component Value Date   INR 1.3* 07/28/2012   INR 1.5 01/26/2007   INR 1.0 01/22/2007     Lab Results  Component Value Date   CHOL 136 06/07/2013   CHOL 129 03/09/2013   CHOL 113 04/16/2012   Lab Results  Component Value Date   HDL 36* 06/07/2013   HDL 36* 03/09/2013   HDL 22.80* 04/16/2012   Lab Results  Component Value Date   LDLCALC 42 06/07/2013   LDLCALC 57 03/09/2013   LDLCALC 59 04/16/2012   Lab Results  Component Value Date   TRIG 290* 06/07/2013   TRIG 181* 03/09/2013   TRIG 157.0* 04/16/2012   Lab Results  Component Value Date   CHOLHDL 3.8 06/07/2013   CHOLHDL 3.6 03/09/2013   CHOLHDL 5 04/16/2012   Lab Results  Component Value Date   LDLDIRECT 52.0 03/17/2011   LDLDIRECT 55.3 08/09/2010   LDLDIRECT 56.8 03/25/2007      Radiology:  No results found.  EKG:  Initial EKG shows SVT  With Atrial flutter with RVR and 2:1 block and nonspecific IVCD after giving Adenosine in ER  ASSESSMENT:  1.  Acute on chronic systolic CHF with elevated BNP and edema vs. Infiltrate on chest xray.  His lung are clear on exam but he has already received Lasix 40mg  IV in ER.   2.  ASCAD with remote CABG 3.  Elevated troponin with normal CPK/MB most likely related to acute CHF 4.  Elevated D-dimer with sudden onset of SOB - need to rule out PE 5.  HTN 6.  Dyslipidemia with chronically elevated LFTs at baseline from February and May of 2015 7.  Elevated BS with history of borderline DM in the past 8.  Atrial flutter with RVR with history of ablation 1  year ago.  PLAN:   1.  Admit to stepdown 2.  IV Heparin gtt 3.  Cycle cardiac enzymes 4.  2D echo to reassess LVF 5.  IV Cardizem gtt for rate control 6.  Continue ASA/statin 7.  Start Coreg 3.125mg  BID ( had been on Bystolic 5mg  daily in the past but stopped due to bradycardia) - will follow for bradycardia 8.  Stop Exforge and continue Valsartan - this will allow adequate BP to uptitrate BB 9.  Chest CT angio to rule out PE 10.  Lasix 40mg  IV BID and follow renal function while diuresing 11.  Check HbA1C and cover with SS Insulin  Quintella Reichert, MD  07/30/2013  3:45 PM

## 2013-07-30 NOTE — Progress Notes (Signed)
ANTICOAGULATION CONSULT NOTE - Initial Consult  Pharmacy Consult for Heparin  Indication: Atrial fibrillation   No Known Allergies  Patient Measurements: Height: 6' 2.02" (188 cm) Weight: 221 lb 1.9 oz (100.3 kg) (06/07/13) IBW/kg (Calculated) : 82.24   Vital Signs:    Labs: No results found for this basename: HGB, HCT, PLT, APTT, LABPROT, INR, HEPARINUNFRC, CREATININE, CKTOTAL, CKMB, TROPONINI,  in the last 72 hours  Estimated Creatinine Clearance: 105.8 ml/min (by C-G formula based on Cr of 0.88).   Medical History: Past Medical History  Diagnosis Date  . Anxiety   . Hypertension   . Low back pain   . Tobacco user   . CAD (coronary artery disease) of artery bypass graft   . Hyperlipidemia   . S/P colonoscopy   . Cardiomyopathy     Echo 6/14: Mild LVH, EF 30-35%, diffuse HK, MAC, mild BAE  . Shortness of breath   . Myocardial infarction   . Arthritis   . Atrial flutter 07-2012    s/p ablation by Dr Graciela Husbands 08-06-2012  . Chicken pox as a child  . Measles as a child  . Mumps as a child  . Abnormal liver function 08/23/2010  . Medicare welcome exam 06/12/2013    Medications:  Prescriptions prior to admission  Medication Sig Dispense Refill  . amLODipine-valsartan (EXFORGE) 5-320 MG per tablet       . aspirin EC 81 MG tablet Take 1 tablet (81 mg total) by mouth daily.      Marland Kitchen atorvastatin (LIPITOR) 20 MG tablet Take 1 tablet (20 mg total) by mouth daily.  90 tablet  1  . busPIRone (BUSPAR) 10 MG tablet Take 1 tablet (10 mg total) by mouth 2 (two) times daily.  180 tablet  1  . clonazePAM (KLONOPIN) 0.5 MG tablet TAKE 1 TABLET BY MOUTH 3 TIMES A DAY AS NEEDED FOR ANXIETY  90 tablet  2  . cyclobenzaprine (FLEXERIL) 10 MG tablet TAKE 1 TABLET BY MOUTH 3 TIMES A DAY AS NEEDED  90 tablet  3  . Omega-3 Fatty Acids (FISH OIL PO) Take 1 capsule by mouth daily.       Marland Kitchen POTASSIUM PO Take 1 tablet by mouth daily. otc - pt does not know strength        Assessment: 63 YOM with  Afib to start heparin infusion. He also has elevated D-dimer. Need to r/o PE. Hgb 17.5. Plt wnl.   Goal of Therapy:  Heparin level 0.3-0.7 units/ml Monitor platelets by anticoagulation protocol: Yes   Plan:  -Heparin 4000 units bolus followed by 1400 units/hr  -F/u 6-hr HL -Monitor daily HL, CBC, and s/s of bleeding  -F/u CT angio to rule out PE   Vinnie Level, PharmD.  Clinical Pharmacist Pager [phone]

## 2013-07-31 LAB — HEPARIN LEVEL (UNFRACTIONATED)
HEPARIN UNFRACTIONATED: 0.21 [IU]/mL — AB (ref 0.30–0.70)
HEPARIN UNFRACTIONATED: 0.28 [IU]/mL — AB (ref 0.30–0.70)
HEPARIN UNFRACTIONATED: 0.31 [IU]/mL (ref 0.30–0.70)
Heparin Unfractionated: 0.38 IU/mL (ref 0.30–0.70)

## 2013-07-31 LAB — CBC
HEMATOCRIT: 45.2 % (ref 39.0–52.0)
HEMOGLOBIN: 15 g/dL (ref 13.0–17.0)
MCH: 29.4 pg (ref 26.0–34.0)
MCHC: 33.2 g/dL (ref 30.0–36.0)
MCV: 88.6 fL (ref 78.0–100.0)
Platelets: 184 10*3/uL (ref 150–400)
RBC: 5.1 MIL/uL (ref 4.22–5.81)
RDW: 13.9 % (ref 11.5–15.5)
WBC: 11.8 10*3/uL — AB (ref 4.0–10.5)

## 2013-07-31 LAB — TROPONIN I
Troponin I: 1.61 ng/mL (ref <0.30)
Troponin I: 2.01 ng/mL (ref <0.30)

## 2013-07-31 LAB — GLUCOSE, CAPILLARY
GLUCOSE-CAPILLARY: 170 mg/dL — AB (ref 70–99)
GLUCOSE-CAPILLARY: 155 mg/dL — AB (ref 70–99)
Glucose-Capillary: 198 mg/dL — ABNORMAL HIGH (ref 70–99)
Glucose-Capillary: 182 mg/dL — ABNORMAL HIGH (ref 70–99)

## 2013-07-31 LAB — COMPREHENSIVE METABOLIC PANEL
ALT: 60 U/L — ABNORMAL HIGH (ref 0–53)
AST: 48 U/L — ABNORMAL HIGH (ref 0–37)
Albumin: 3.1 g/dL — ABNORMAL LOW (ref 3.5–5.2)
Alkaline Phosphatase: 68 U/L (ref 39–117)
Anion gap: 17 — ABNORMAL HIGH (ref 5–15)
BUN: 18 mg/dL (ref 6–23)
CO2: 22 mEq/L (ref 19–32)
Calcium: 8.6 mg/dL (ref 8.4–10.5)
Chloride: 101 mEq/L (ref 96–112)
Creatinine, Ser: 1.03 mg/dL (ref 0.50–1.35)
GFR calc Af Amer: 86 mL/min — ABNORMAL LOW (ref >90)
GFR calc non Af Amer: 74 mL/min — ABNORMAL LOW (ref >90)
Glucose, Bld: 166 mg/dL — ABNORMAL HIGH (ref 70–99)
Potassium: 4.1 mEq/L (ref 3.7–5.3)
Sodium: 140 mEq/L (ref 137–147)
Total Bilirubin: 1.1 mg/dL (ref 0.3–1.2)
Total Protein: 6.2 g/dL (ref 6.0–8.3)

## 2013-07-31 LAB — TSH: TSH: 1.19 u[IU]/mL (ref 0.350–4.500)

## 2013-07-31 MED ORDER — SODIUM CHLORIDE 0.9 % IV SOLN
1.0000 mL/kg/h | INTRAVENOUS | Status: DC
  Administered 2013-08-01: 1 mL/kg/h via INTRAVENOUS

## 2013-07-31 MED ORDER — SODIUM CHLORIDE 0.9 % IV SOLN: 250.0000 mL | INTRAVENOUS | Status: DC | PRN

## 2013-07-31 MED ORDER — ASPIRIN 81 MG PO CHEW
81.0000 mg | CHEWABLE_TABLET | ORAL | Status: AC
  Administered 2013-08-01: 81 mg via ORAL
  Filled 2013-07-31: qty 1

## 2013-07-31 MED ORDER — SODIUM CHLORIDE 0.9 % IJ SOLN
3.0000 mL | INTRAMUSCULAR | Status: DC | PRN
Start: 2013-07-31 — End: 2013-08-01

## 2013-07-31 MED ORDER — SODIUM CHLORIDE 0.9 % IJ SOLN
3.0000 mL | Freq: Two times a day (BID) | INTRAMUSCULAR | Status: DC
  Administered 2013-07-31 – 2013-08-01 (×2): 3 mL via INTRAVENOUS

## 2013-07-31 NOTE — Progress Notes (Signed)
ANTICOAGULATION CONSULT NOTE - Follow Up Consult  Pharmacy Consult for heparin Indication: chest pain/ACS  Labs:  Recent Labs  07/30/13 1700 07/30/13 2346  HEPARINUNFRC  --  0.28*  TROPONINI 1.37*  --     Assessment: 63yo male slightly subtherapeutic on heparin with initial dosing for CP.  Goal of Therapy:  Heparin level 0.3-0.7 units/ml   Plan:  Will increase heparin gtt by 1 unit/kg/hr to 1500 units/hr and check level in 6-8hr.  Vernard Gambles, PharmD, BCPS  07/31/2013,12:24 AM

## 2013-07-31 NOTE — Progress Notes (Signed)
ANTICOAGULATION CONSULT NOTE - Initial Consult  Pharmacy Consult for Heparin  Indication: Atrial fibrillation   No Known Allergies  Patient Measurements: Height: 6' 2.02" (188 cm) Weight: 227 lb 8.2 oz (103.2 kg) IBW/kg (Calculated) : 82.24 Heparin dosing weight: 103 kg  Vital Signs: Temp: 97.6 F (36.4 C) (07/19 0800) Temp src: Oral (07/19 0800) BP: 131/87 mmHg (07/19 0441) Pulse Rate: 100 (07/19 0441)  Labs:  Recent Labs  07/30/13 1700 07/30/13 2346 07/31/13 0420 07/31/13 0751  HGB  --   --  15.0  --   HCT  --   --  45.2  --   PLT  --   --  184  --   HEPARINUNFRC  --  0.28*  --  0.21*  CREATININE  --   --  1.03  --   TROPONINI 1.37* 2.01* 1.61*  --     Estimated Creatinine Clearance: 91.6 ml/min (by C-G formula based on Cr of 1.03).   Medical History: Past Medical History  Diagnosis Date  . Anxiety   . Hypertension   . Low back pain   . Tobacco user   . CAD (coronary artery disease) of artery bypass graft   . Hyperlipidemia   . S/P colonoscopy   . Cardiomyopathy     Echo 6/14: Mild LVH, EF 30-35%, diffuse HK, MAC, mild BAE  . Shortness of breath   . Myocardial infarction   . Arthritis   . Atrial flutter 07-2012    s/p ablation by Dr Graciela Husbands 08-06-2012  . Chicken pox as a child  . Measles as a child  . Mumps as a child  . Abnormal liver function 08/23/2010  . Medicare welcome exam 06/12/2013    Medications:  Prescriptions prior to admission  Medication Sig Dispense Refill  . amLODipine-valsartan (EXFORGE) 5-320 MG per tablet       . aspirin EC 81 MG tablet Take 1 tablet (81 mg total) by mouth daily.      Marland Kitchen atorvastatin (LIPITOR) 20 MG tablet Take 1 tablet (20 mg total) by mouth daily.  90 tablet  1  . busPIRone (BUSPAR) 10 MG tablet Take 1 tablet (10 mg total) by mouth 2 (two) times daily.  180 tablet  1  . clonazePAM (KLONOPIN) 0.5 MG tablet TAKE 1 TABLET BY MOUTH 3 TIMES A DAY AS NEEDED FOR ANXIETY  90 tablet  2  . cyclobenzaprine (FLEXERIL) 10 MG  tablet TAKE 1 TABLET BY MOUTH 3 TIMES A DAY AS NEEDED  90 tablet  3  . Omega-3 Fatty Acids (FISH OIL PO) Take 1 capsule by mouth daily.       Marland Kitchen POTASSIUM PO Take 1 tablet by mouth daily. otc - pt does not know strength        Assessment: 63 YOM with CP/Afib to start heparin infusion. He also had an elevated d-dimer but CTa ruled out PE. Overnight HL was slightly SUBtherapeutic and has further declined despite increase in rate (0.28>0.21). Per nurse, no interruptions in gtt. H/H and plt wnl and no reported s/s bleeding.  Goal of Therapy:  Heparin level 0.3-0.7 units/ml Monitor platelets by anticoagulation protocol: Yes   Plan:  - Increase heparin to 1800 units/hr (~3 u/kg/hr increase) - F/u 6-hr HL - Monitor daily HL, CBC, and s/s of bleeding   Margie Billet, PharmD Clinical Pharmacist - Resident Pager: [phone] Pharmacy: [phone] 07/31/2013 8:55 AM

## 2013-07-31 NOTE — Progress Notes (Signed)
ANTICOAGULATION CONSULT NOTE - Follow-Up Consult  Pharmacy Consult for Heparin  Indication: Atrial fibrillation   No Known Allergies  Patient Measurements: Height: 6' 2.02" (188 cm) Weight: 227 lb 8.2 oz (103.2 kg) IBW/kg (Calculated) : 82.24 Heparin dosing weight: 103 kg  Vital Signs: Temp: 98.5 F (36.9 C) (07/19 1200) Temp src: Oral (07/19 1200) BP: 94/60 mmHg (07/19 1304) Pulse Rate: 95 (07/19 0952)  Labs:  Recent Labs  07/30/13 1700 07/30/13 2346 07/31/13 0420 07/31/13 0751 07/31/13 1522  HGB  --   --  15.0  --   --   HCT  --   --  45.2  --   --   PLT  --   --  184  --   --   HEPARINUNFRC  --  0.28*  --  0.21* 0.38  CREATININE  --   --  1.03  --   --   TROPONINI 1.37* 2.01* 1.61*  --   --     Estimated Creatinine Clearance: 91.6 ml/min (by C-G formula based on Cr of 1.03).   Medical History: Past Medical History  Diagnosis Date  . Anxiety   . Hypertension   . Low back pain   . Tobacco user   . CAD (coronary artery disease) of artery bypass graft   . Hyperlipidemia   . S/P colonoscopy   . Cardiomyopathy     Echo 6/14: Mild LVH, EF 30-35%, diffuse HK, MAC, mild BAE  . Shortness of breath   . Myocardial infarction   . Arthritis   . Atrial flutter 07-2012    s/p ablation by Dr Graciela Husbands 08-06-2012  . Chicken pox as a child  . Measles as a child  . Mumps as a child  . Abnormal liver function 08/23/2010  . Medicare welcome exam 06/12/2013    Medications:  Prescriptions prior to admission  Medication Sig Dispense Refill  . amLODipine-valsartan (EXFORGE) 5-320 MG per tablet       . aspirin EC 81 MG tablet Take 1 tablet (81 mg total) by mouth daily.      Marland Kitchen atorvastatin (LIPITOR) 20 MG tablet Take 1 tablet (20 mg total) by mouth daily.  90 tablet  1  . busPIRone (BUSPAR) 10 MG tablet Take 1 tablet (10 mg total) by mouth 2 (two) times daily.  180 tablet  1  . clonazePAM (KLONOPIN) 0.5 MG tablet TAKE 1 TABLET BY MOUTH 3 TIMES A DAY AS NEEDED FOR ANXIETY  90  tablet  2  . cyclobenzaprine (FLEXERIL) 10 MG tablet TAKE 1 TABLET BY MOUTH 3 TIMES A DAY AS NEEDED  90 tablet  3  . Omega-3 Fatty Acids (FISH OIL PO) Take 1 capsule by mouth daily.       Marland Kitchen POTASSIUM PO Take 1 tablet by mouth daily. otc - pt does not know strength        Assessment: Larry Rangel with CP/Afib to start heparin infusion. He also had an elevated d-dimer but CTa ruled out PE.  H/H and plt wnl and no reported s/s bleeding. Heparin level currently therapeutic on 1800 units/hr.  No bleeding or complications noted  Goal of Therapy:  Heparin level 0.3-0.7 units/ml Monitor platelets by anticoagulation protocol: Yes   Plan:  -Continue heparin at current rate. -Recheck level in 6 hrs to confirm. Tad Moore, BCPS  Clinical Pharmacist Pager [phone]  07/31/2013 4:09 PM

## 2013-07-31 NOTE — Progress Notes (Signed)
Utilization Review Completed.Larry Rangel T7/19/2015  

## 2013-07-31 NOTE — Progress Notes (Signed)
ANTICOAGULATION CONSULT NOTE - Follow Up Consult  Pharmacy Consult for heparin Indication: atrial fibrillation  Labs:  Recent Labs  07/30/13 1700  07/30/13 2346 07/31/13 0420 07/31/13 0751 07/31/13 1522 07/31/13 2156  HGB  --   --   --  15.0  --   --   --   HCT  --   --   --  45.2  --   --   --   PLT  --   --   --  184  --   --   --   HEPARINUNFRC  --   < > 0.28*  --  0.21* 0.38 0.31  CREATININE  --   --   --  1.03  --   --   --   TROPONINI 1.37*  --  2.01* 1.61*  --   --   --   < > = values in this interval not displayed.   Assessment: 63yo male remains therapeutic on heparin though level is trending down and now at very low end of goal.  Goal of Therapy:  Heparin level 0.3-0.7 units/ml   Plan:  Will increase heparin gtt slightly to 1900 units/hr to prevent subtherapeutic level and recheck with am labs.  Vernard Gambles, PharmD, BCPS  07/31/2013,10:36 PM

## 2013-07-31 NOTE — Progress Notes (Signed)
Subjective: Feeling a lot better than yesterday.  No CP  Still some SOB   Objective: Filed Vitals:   07/30/13 2352 07/31/13 0000 07/31/13 0441 07/31/13 0800  BP:  118/90 131/87   Pulse:  96 100   Temp: 97.5 F (36.4 C)  97.8 F (36.6 C) 97.6 F (36.4 C)  TempSrc: Oral  Oral Oral  Resp:  20 28   Height:      Weight:   227 lb 8.2 oz (103.2 kg)   SpO2:  98% 97%    Weight change:   Intake/Output Summary (Last 24 hours) at 07/31/13 0928 Last data filed at 07/31/13 0700  Gross per 24 hour  Intake 688.37 ml  Output    950 ml  Net -261.63 ml    General: Alert, awake, oriented x3, in no acute distress Neck:  JVP is normal Heart: Regular rate and rhythm, without murmurs, rubs, gallops.  Lungs: Coarse BS   Exemities:  No edema.   Neuro: Grossly intact, nonfocal.  Tele: SR   Lab Results: Results for orders placed during the hospital encounter of 07/30/13 (from the past 24 hour(s))  MRSA PCR SCREENING     Status: None   Collection Time    07/30/13  3:48 PM      Result Value Ref Range   MRSA by PCR NEGATIVE  NEGATIVE  HEMOGLOBIN A1C     Status: Abnormal   Collection Time    07/30/13  4:30 PM      Result Value Ref Range   Hemoglobin A1C 8.0 (*) <5.7 %   Mean Plasma Glucose 183 (*) <117 mg/dL  TROPONIN I     Status: Abnormal   Collection Time    07/30/13  5:00 PM      Result Value Ref Range   Troponin I 1.37 (*) <0.30 ng/mL  GLUCOSE, CAPILLARY     Status: Abnormal   Collection Time    07/30/13  6:31 PM      Result Value Ref Range   Glucose-Capillary 179 (*) 70 - 99 mg/dL  GLUCOSE, CAPILLARY     Status: Abnormal   Collection Time    07/30/13 10:10 PM      Result Value Ref Range   Glucose-Capillary 185 (*) 70 - 99 mg/dL   Comment 1 Documented in Chart     Comment 2 Notify RN    TROPONIN I     Status: Abnormal   Collection Time    07/30/13 11:46 PM      Result Value Ref Range   Troponin I 2.01 (*) <0.30 ng/mL  HEPARIN LEVEL (UNFRACTIONATED)     Status: Abnormal    Collection Time    07/30/13 11:46 PM      Result Value Ref Range   Heparin Unfractionated 0.28 (*) 0.30 - 0.70 IU/mL  TROPONIN I     Status: Abnormal   Collection Time    07/31/13  4:20 AM      Result Value Ref Range   Troponin I 1.61 (*) <0.30 ng/mL  TSH     Status: None   Collection Time    07/31/13  4:20 AM      Result Value Ref Range   TSH 1.190  0.350 - 4.500 uIU/mL  COMPREHENSIVE METABOLIC PANEL     Status: Abnormal   Collection Time    07/31/13  4:20 AM      Result Value Ref Range   Sodium 140  137 - 147 mEq/L   Potassium  4.1  3.7 - 5.3 mEq/L   Chloride 101  96 - 112 mEq/L   CO2 22  19 - 32 mEq/L   Glucose, Bld 166 (*) 70 - 99 mg/dL   BUN 18  6 - 23 mg/dL   Creatinine, Ser 9.521.03  0.50 - 1.35 mg/dL   Calcium 8.6  8.4 - 84.110.5 mg/dL   Total Protein 6.2  6.0 - 8.3 g/dL   Albumin 3.1 (*) 3.5 - 5.2 g/dL   AST 48 (*) 0 - 37 U/L   ALT 60 (*) 0 - 53 U/L   Alkaline Phosphatase 68  39 - 117 U/L   Total Bilirubin 1.1  0.3 - 1.2 mg/dL   GFR calc non Af Amer 74 (*) >90 mL/min   GFR calc Af Amer 86 (*) >90 mL/min   Anion gap 17 (*) 5 - 15  CBC     Status: Abnormal   Collection Time    07/31/13  4:20 AM      Result Value Ref Range   WBC 11.8 (*) 4.0 - 10.5 K/uL   RBC 5.10  4.22 - 5.81 MIL/uL   Hemoglobin 15.0  13.0 - 17.0 g/dL   HCT 32.445.2  40.139.0 - 02.752.0 %   MCV 88.6  78.0 - 100.0 fL   MCH 29.4  26.0 - 34.0 pg   MCHC 33.2  30.0 - 36.0 g/dL   RDW 25.313.9  66.411.5 - 40.315.5 %   Platelets 184  150 - 400 K/uL  GLUCOSE, CAPILLARY     Status: Abnormal   Collection Time    07/31/13  7:35 AM      Result Value Ref Range   Glucose-Capillary 170 (*) 70 - 99 mg/dL  HEPARIN LEVEL (UNFRACTIONATED)     Status: Abnormal   Collection Time    07/31/13  7:51 AM      Result Value Ref Range   Heparin Unfractionated 0.21 (*) 0.30 - 0.70 IU/mL    Studies/Results: Ct Angio Chest Pe W/cm &/or Wo Cm  07/30/2013   CLINICAL DATA:  Severe shortness of breath. Elevated D-dimer. Clinical suspicion for  pulmonary embolism.  EXAM: CT ANGIOGRAPHY CHEST WITH CONTRAST  TECHNIQUE: Multidetector CT imaging of the chest was performed using the standard protocol during bolus administration of intravenous contrast. Multiplanar CT image reconstructions and MIPs were obtained to evaluate the vascular anatomy.  CONTRAST:  80mL OMNIPAQUE IOHEXOL 350 MG/ML SOLN  COMPARISON:  12/31/2006  FINDINGS: Satisfactory opacification of pulmonary arteries noted, and no pulmonary emboli identified.  Diffuse interstitial infiltrates are seen as well as small bilateral pleural effusions, suspicious for pulmonary edema secondary to congestive heart failure. Mild cardiomegaly is noted. Atelectasis is noted in both lung bases. There is also patchy areas of airspace disease seen involving the right lung greater than left, and superimposed pneumonia cannot definitely be excluded.  No evidence of hilar or mediastinal lymphadenopathy. No adenopathy seen elsewhere within the thorax. No suspicious bone lesions identified.  Review of the MIP images confirms the above findings.  IMPRESSION: No evidence of pulmonary embolism.  Cardiomegaly, diffuse interstitial infiltrates, and small bilateral pleural effusions, consistent with congestive heart failure.  Patchy areas of airspace disease also seen in both lungs ; superimposed pneumonia cannot definitely be excluded. Chest radiographic followup is recommended.   Electronically Signed   By: Myles RosenthalJohn  Stahl M.D.   On: 07/30/2013 18:05    Medications:  REviewed   @PROBHOSP @  1.  Atrial flutter  Back in SR  Not  clear if this was what caused him to decompensate He did have a sl bump in troponin.  Would follow for now.    2.  CAD s/o CABG 2009  Concerning with presentation and bump in trop that there is ischemia invollved  Given profound presentation I would recomm cath to confirm anatomy  Plan for am    3  Ischemic cardiomyopathy  LVEF 30 to 35% by echo 6/14  Would continue diuresis.      LOS: 1 day    Dietrich Pates 07/31/2013, 9:28 AM

## 2013-08-01 ENCOUNTER — Encounter (HOSPITAL_COMMUNITY)
Admission: AD | Disposition: A | Discharge status: Home / Self Care | Payer: Self-pay | Source: Other Acute Inpatient Hospital | Attending: Internal Medicine

## 2013-08-01 DIAGNOSIS — I2510 Atherosclerotic heart disease of native coronary artery without angina pectoris: Secondary | ICD-10-CM

## 2013-08-01 HISTORY — PX: LEFT HEART CATHETERIZATION WITH CORONARY ANGIOGRAM: SHX5451

## 2013-08-01 LAB — POCT I-STAT 3, VENOUS BLOOD GAS (G3P V)
ACID-BASE DEFICIT: 5 mmol/L — AB (ref 0.0–2.0)
Bicarbonate: 21.1 mEq/L (ref 20.0–24.0)
O2 SAT: 49 %
TCO2: 22 mmol/L (ref 0–100)
pCO2, Ven: 42.5 mmHg — ABNORMAL LOW (ref 45.0–50.0)
pH, Ven: 7.303 — ABNORMAL HIGH (ref 7.250–7.300)
pO2, Ven: 29 mmHg — CL (ref 30.0–45.0)

## 2013-08-01 LAB — CBC
HCT: 44.8 % (ref 39.0–52.0)
HEMOGLOBIN: 14.7 g/dL (ref 13.0–17.0)
MCH: 29.5 pg (ref 26.0–34.0)
MCHC: 32.8 g/dL (ref 30.0–36.0)
MCV: 89.8 fL (ref 78.0–100.0)
Platelets: 176 10*3/uL (ref 150–400)
RBC: 4.99 MIL/uL (ref 4.22–5.81)
RDW: 13.8 % (ref 11.5–15.5)
WBC: 9.9 10*3/uL (ref 4.0–10.5)

## 2013-08-01 LAB — POCT ACTIVATED CLOTTING TIME: ACTIVATED CLOTTING TIME: 106 s

## 2013-08-01 LAB — POCT I-STAT 3, ART BLOOD GAS (G3+)
ACID-BASE DEFICIT: 4 mmol/L — AB (ref 0.0–2.0)
BICARBONATE: 19.8 meq/L — AB (ref 20.0–24.0)
O2 SAT: 93 %
PCO2 ART: 33.6 mmHg — AB (ref 35.0–45.0)
PO2 ART: 66 mmHg — AB (ref 80.0–100.0)
TCO2: 21 mmol/L (ref 0–100)
pH, Arterial: 7.378 (ref 7.350–7.450)

## 2013-08-01 LAB — PROTIME-INR
INR: 1.22 (ref 0.00–1.49)
Prothrombin Time: 15.4 seconds — ABNORMAL HIGH (ref 11.6–15.2)

## 2013-08-01 LAB — GLUCOSE, CAPILLARY
GLUCOSE-CAPILLARY: 158 mg/dL — AB (ref 70–99)
Glucose-Capillary: 158 mg/dL — ABNORMAL HIGH (ref 70–99)
Glucose-Capillary: 185 mg/dL — ABNORMAL HIGH (ref 70–99)
Glucose-Capillary: 127 mg/dL — ABNORMAL HIGH (ref 70–99)

## 2013-08-01 LAB — HEPARIN LEVEL (UNFRACTIONATED): Heparin Unfractionated: 0.34 IU/mL (ref 0.30–0.70)

## 2013-08-01 SURGERY — LEFT HEART CATHETERIZATION WITH CORONARY ANGIOGRAM
Anesthesia: LOCAL

## 2013-08-01 MED ORDER — MIDAZOLAM HCL 2 MG/2ML IJ SOLN
INTRAMUSCULAR | Status: AC
  Filled 2013-08-01: qty 2

## 2013-08-01 MED ORDER — HEPARIN (PORCINE) IN NACL 100-0.45 UNIT/ML-% IJ SOLN
1900.0000 [IU]/h | INTRAMUSCULAR | Status: AC
  Administered 2013-08-01 – 2013-08-02 (×2): 1900 [IU]/h via INTRAVENOUS
  Filled 2013-08-01 (×4): qty 250

## 2013-08-01 MED ORDER — SODIUM CHLORIDE 0.9 % IV SOLN: INTRAVENOUS | Status: AC

## 2013-08-01 MED ORDER — HEPARIN (PORCINE) IN NACL 2-0.9 UNIT/ML-% IJ SOLN
INTRAMUSCULAR | Status: AC
  Filled 2013-08-01: qty 1500

## 2013-08-01 MED ORDER — ALPRAZOLAM 0.25 MG PO TABS
ORAL_TABLET | ORAL | Status: AC
  Administered 2013-08-01: 0.25 mg
  Filled 2013-08-01: qty 1

## 2013-08-01 MED ORDER — FENTANYL CITRATE 0.05 MG/ML IJ SOLN
INTRAMUSCULAR | Status: AC
  Filled 2013-08-01: qty 2

## 2013-08-01 MED ORDER — SODIUM CHLORIDE 0.9 % IV SOLN
INTRAVENOUS | Status: DC | PRN
  Administered 2013-08-02: 500 mL via INTRAVENOUS

## 2013-08-01 MED ORDER — NITROGLYCERIN 1 MG/10 ML FOR IR/CATH LAB
INTRA_ARTERIAL | Status: AC
  Filled 2013-08-01: qty 10

## 2013-08-01 MED ORDER — LIDOCAINE HCL (PF) 1 % IJ SOLN
INTRAMUSCULAR | Status: AC
  Filled 2013-08-01: qty 30

## 2013-08-01 MED ORDER — FUROSEMIDE 10 MG/ML IJ SOLN
INTRAMUSCULAR | Status: AC
  Filled 2013-08-01: qty 4

## 2013-08-01 NOTE — H&P (View-Only) (Signed)
Subjective: Feeling a lot better than yesterday.  No CP  Still some SOB   Objective: Filed Vitals:   07/30/13 2352 07/31/13 0000 07/31/13 0441 07/31/13 0800  BP:  118/90 131/87   Pulse:  96 100   Temp: 97.5 F (36.4 C)  97.8 F (36.6 C) 97.6 F (36.4 C)  TempSrc: Oral  Oral Oral  Resp:  20 28   Height:      Weight:   227 lb 8.2 oz (103.2 kg)   SpO2:  98% 97%    Weight change:   Intake/Output Summary (Last 24 hours) at 07/31/13 0928 Last data filed at 07/31/13 0700  Gross per 24 hour  Intake 688.37 ml  Output    950 ml  Net -261.63 ml    General: Alert, awake, oriented x3, in no acute distress Neck:  JVP is normal Heart: Regular rate and rhythm, without murmurs, rubs, gallops.  Lungs: Coarse BS   Exemities:  No edema.   Neuro: Grossly intact, nonfocal.  Tele: SR   Lab Results: Results for orders placed during the hospital encounter of 07/30/13 (from the past 24 hour(s))  MRSA PCR SCREENING     Status: None   Collection Time    07/30/13  3:48 PM      Result Value Ref Range   MRSA by PCR NEGATIVE  NEGATIVE  HEMOGLOBIN A1C     Status: Abnormal   Collection Time    07/30/13  4:30 PM      Result Value Ref Range   Hemoglobin A1C 8.0 (*) <5.7 %   Mean Plasma Glucose 183 (*) <117 mg/dL  TROPONIN I     Status: Abnormal   Collection Time    07/30/13  5:00 PM      Result Value Ref Range   Troponin I 1.37 (*) <0.30 ng/mL  GLUCOSE, CAPILLARY     Status: Abnormal   Collection Time    07/30/13  6:31 PM      Result Value Ref Range   Glucose-Capillary 179 (*) 70 - 99 mg/dL  GLUCOSE, CAPILLARY     Status: Abnormal   Collection Time    07/30/13 10:10 PM      Result Value Ref Range   Glucose-Capillary 185 (*) 70 - 99 mg/dL   Comment 1 Documented in Chart     Comment 2 Notify RN    TROPONIN I     Status: Abnormal   Collection Time    07/30/13 11:46 PM      Result Value Ref Range   Troponin I 2.01 (*) <0.30 ng/mL  HEPARIN LEVEL (UNFRACTIONATED)     Status: Abnormal    Collection Time    07/30/13 11:46 PM      Result Value Ref Range   Heparin Unfractionated 0.28 (*) 0.30 - 0.70 IU/mL  TROPONIN I     Status: Abnormal   Collection Time    07/31/13  4:20 AM      Result Value Ref Range   Troponin I 1.61 (*) <0.30 ng/mL  TSH     Status: None   Collection Time    07/31/13  4:20 AM      Result Value Ref Range   TSH 1.190  0.350 - 4.500 uIU/mL  COMPREHENSIVE METABOLIC PANEL     Status: Abnormal   Collection Time    07/31/13  4:20 AM      Result Value Ref Range   Sodium 140  137 - 147 mEq/L   Potassium  4.1  3.7 - 5.3 mEq/L   Chloride 101  96 - 112 mEq/L   CO2 22  19 - 32 mEq/L   Glucose, Bld 166 (*) 70 - 99 mg/dL   BUN 18  6 - 23 mg/dL   Creatinine, Ser 9.521.03  0.50 - 1.35 mg/dL   Calcium 8.6  8.4 - 84.110.5 mg/dL   Total Protein 6.2  6.0 - 8.3 g/dL   Albumin 3.1 (*) 3.5 - 5.2 g/dL   AST 48 (*) 0 - 37 U/L   ALT 60 (*) 0 - 53 U/L   Alkaline Phosphatase 68  39 - 117 U/L   Total Bilirubin 1.1  0.3 - 1.2 mg/dL   GFR calc non Af Amer 74 (*) >90 mL/min   GFR calc Af Amer 86 (*) >90 mL/min   Anion gap 17 (*) 5 - 15  CBC     Status: Abnormal   Collection Time    07/31/13  4:20 AM      Result Value Ref Range   WBC 11.8 (*) 4.0 - 10.5 K/uL   RBC 5.10  4.22 - 5.81 MIL/uL   Hemoglobin 15.0  13.0 - 17.0 g/dL   HCT 32.445.2  40.139.0 - 02.752.0 %   MCV 88.6  78.0 - 100.0 fL   MCH 29.4  26.0 - 34.0 pg   MCHC 33.2  30.0 - 36.0 g/dL   RDW 25.313.9  66.411.5 - 40.315.5 %   Platelets 184  150 - 400 K/uL  GLUCOSE, CAPILLARY     Status: Abnormal   Collection Time    07/31/13  7:35 AM      Result Value Ref Range   Glucose-Capillary 170 (*) 70 - 99 mg/dL  HEPARIN LEVEL (UNFRACTIONATED)     Status: Abnormal   Collection Time    07/31/13  7:51 AM      Result Value Ref Range   Heparin Unfractionated 0.21 (*) 0.30 - 0.70 IU/mL    Studies/Results: Ct Angio Chest Pe W/cm &/or Wo Cm  07/30/2013   CLINICAL DATA:  Severe shortness of breath. Elevated D-dimer. Clinical suspicion for  pulmonary embolism.  EXAM: CT ANGIOGRAPHY CHEST WITH CONTRAST  TECHNIQUE: Multidetector CT imaging of the chest was performed using the standard protocol during bolus administration of intravenous contrast. Multiplanar CT image reconstructions and MIPs were obtained to evaluate the vascular anatomy.  CONTRAST:  80mL OMNIPAQUE IOHEXOL 350 MG/ML SOLN  COMPARISON:  12/31/2006  FINDINGS: Satisfactory opacification of pulmonary arteries noted, and no pulmonary emboli identified.  Diffuse interstitial infiltrates are seen as well as small bilateral pleural effusions, suspicious for pulmonary edema secondary to congestive heart failure. Mild cardiomegaly is noted. Atelectasis is noted in both lung bases. There is also patchy areas of airspace disease seen involving the right lung greater than left, and superimposed pneumonia cannot definitely be excluded.  No evidence of hilar or mediastinal lymphadenopathy. No adenopathy seen elsewhere within the thorax. No suspicious bone lesions identified.  Review of the MIP images confirms the above findings.  IMPRESSION: No evidence of pulmonary embolism.  Cardiomegaly, diffuse interstitial infiltrates, and small bilateral pleural effusions, consistent with congestive heart failure.  Patchy areas of airspace disease also seen in both lungs ; superimposed pneumonia cannot definitely be excluded. Chest radiographic followup is recommended.   Electronically Signed   By: Myles RosenthalJohn  Stahl M.D.   On: 07/30/2013 18:05    Medications:  REviewed   @PROBHOSP @  1.  Atrial flutter  Back in SR  Not  clear if this was what caused him to decompensate He did have a sl bump in troponin.  Would follow for now.    2.  CAD s/o CABG 2009  Concerning with presentation and bump in trop that there is ischemia invollved  Given profound presentation I would recomm cath to confirm anatomy  Plan for am    3  Ischemic cardiomyopathy  LVEF 30 to 35% by echo 6/14  Would continue diuresis.      LOS: 1 day    Dietrich Pates 07/31/2013, 9:28 AM

## 2013-08-01 NOTE — CV Procedure (Signed)
Cardiac Catheterization Operative Report  Larry MonarchRandall L Brister [id number] 7/20/201510:05 AM Danise EdgeBLYTH, STACEY, MD  Procedure Performed:  1. Left Heart Catheterization 2. Selective Coronary Angiography 3. Right Heart Catheterization 4. SVG angiography 5. LIMA graft angiography  Operator: Verne Carrowhristopher McAlhany, MD  Indication:  63 yo male with history of ischemic cardiomyopathy, CAD s/p CABG, atrial fib/flutter admitted with dyspnea, a fib with RVR, elevated troponin. He has been treated for CHF.                                Procedure Details: The risks, benefits, complications, treatment options, and expected outcomes were discussed with the patient. The patient and/or family concurred with the proposed plan, giving informed consent. The patient was brought to the cath lab after IV hydration was begun and oral premedication was given. The patient was further sedated with Versed and Fentanyl. The right groin was prepped and draped in the usual manner. Using the modified Seldinger access technique, a 5 French sheath was placed in the right femoral artery. A 7 French sheath was inserted into the right femoral vein. A balloon tipped catheter was used to perform a right heart catheterization. Standard diagnostic catheters were used to perform selective coronary angiography. A pigtail catheter was used to measure LV pressures. There were no immediate complications. The patient was taken to the recovery area in stable condition.   Hemodynamic Findings: Ao:  107/69             LV: 101/28/35 RA: 17        RV: 53/11/20 PA:  45/21/ (mean 34)       PCWP:  32 Fick Cardiac Output: 3.46 L/min Fick Cardiac Index: 1.51 L/min/m2 Central Aortic Saturation: 93% Pulmonary Artery Saturation: 49%  Angiographic Findings:  Left main: No obstructive disease.   Left Anterior Descending Artery: Large caliber vessel that courses to the apex. 50% proximal stenosis. 50% mid stenosis with competitive filling  seen distally from graft. Mid and distal vessel fills from the patent IMA graft. The diagonal branch is patent, moderate in caliber with proximal 60% stenosis. The graft to the diagonal is occluded.   Circumflex Artery: Large caliber vessel with 50% mid stenosis. There is a large caliber obtuse marginal branch with 100% occlusion, fills from patent vein graft. The left posterolateral branch has a focal 60-70% stenosis. The vein graft that supplied the PL branch (sequential limb) is occluded.   Right Coronary Artery: Large caliber dominant vessel with 99% mid stenosis. The mid and distal vessel fills from the patent free RIMA graft.   Graft Anatomy:  SVG to OM1/sequential to left posterolateral branch is patent to the OM but the sequential limb to the PL is occluded SVG to Diagonal is occluded LIMA to LAD is patent Free RIMA to RCA is patent  Left Ventricular Angiogram: Deferred.   Impression: 1. Severe triple vessel CAD s/p 5V CABG with 3/5 patent bypass grafts. The graft to the left sided PL is occluded and the graft to the diagonal is occluded (both appear to be chronic occlusions) 2. Moderate disease in diagonal and left sided posterolateral branch, does not appear to be flow limiting.  3. Elevated filling pressures  Recommendations: Continue medical management of CAD. He appears to be volume overloaded. This is most likely contributing to his dyspnea and hypoxia. Continue diuresis with IV Lasix.        Complications:  None; patient tolerated  the procedure well.

## 2013-08-01 NOTE — Interval H&P Note (Signed)
History and Physical Interval Note:  08/01/2013 9:04 AM  Larry Rangel  has presented today for cardiac cath with the diagnosis of SOB, elevated troponin.  The various methods of treatment have been discussed with the patient and family. After consideration of risks, benefits and other options for treatment, the patient has consented to  Procedure(s): LEFT HEART CATHETERIZATION WITH CORONARY ANGIOGRAM (N/A) as a surgical intervention .  The patient's history has been reviewed, patient examined, no change in status, stable for surgery.  I have reviewed the patient's chart and labs.  Questions were answered to the patient's satisfaction.    Cath Lab Visit (complete for each Cath Lab visit)  Clinical Evaluation Leading to the Procedure:   ACS: yes  Non-ACS:    Anginal Classification: CCS III  Anti-ischemic medical therapy: Minimal Therapy (1 class of medications)  Non-Invasive Test Results: No non-invasive testing performed  Prior CABG: Previous CABG        Larry Rangel

## 2013-08-01 NOTE — Progress Notes (Signed)
Central Tele called nurse to tell that pt's rhythm changed from NSR to A.Fib; Nurse completed EKG to verify rhythm;  EKG showed accelerated junctional rhythm with occasional premature ventricular complex; Cardiology MD aware. Pt asymptomatic, HR 80's, will continue to monitor pt.

## 2013-08-01 NOTE — Progress Notes (Addendum)
ANTICOAGULATION CONSULT NOTE - Follow-Up Consult  Pharmacy Consult for Heparin  Indication: Atrial fibrillation   No Known Allergies  Patient Measurements: Height: 6' 2.02" (188 cm) Weight: 227 lb 4.7 oz (103.1 kg) IBW/kg (Calculated) : 82.24 Heparin dosing weight: 103 kg  Vital Signs: Temp: 97.4 F (36.3 C) (07/20 0754) Temp src: Oral (07/20 0754) BP: 116/80 mmHg (07/20 0754) Pulse Rate: 86 (07/20 0023)  Labs:  Recent Labs  07/30/13 1700 07/30/13 2346 07/31/13 0420  07/31/13 1522 07/31/13 2156 08/01/13 0230 08/01/13 0435  HGB  --   --  15.0  --   --   --  14.7  --   HCT  --   --  45.2  --   --   --  44.8  --   PLT  --   --  184  --   --   --  176  --   LABPROT  --   --   --   --   --   --   --  15.4*  INR  --   --   --   --   --   --   --  1.22  HEPARINUNFRC  --  0.28*  --   < > 0.38 0.31 0.34  --   CREATININE  --   --  1.03  --   --   --   --   --   TROPONINI 1.37* 2.01* 1.61*  --   --   --   --   --   < > = values in this interval not displayed.  Estimated Creatinine Clearance: 91.6 ml/min (by C-G formula based on Cr of 1.03).    Medications:  . sodium chloride 1 mL/kg/hr (08/01/13 0513)  . diltiazem (CARDIZEM) infusion 5 mg/hr (08/01/13 0230)  . heparin 1,900 Units/hr (07/31/13 2235)    Assessment: 63 YOM with CP/Afib to start heparin infusion. He also had an elevated d-dimer but CTA ruled out PE.  H/H and plt wnl and no reported s/sx bleeding. Heparin level currently therapeutic on 1900 units/hr.  No bleeding or complications noted.  Goal of Therapy:  Heparin level 0.3-0.7 units/ml Monitor platelets by anticoagulation protocol: Yes   Plan:  -Continue IV heparin at 1900 units/hr. -Daily heparin level and CBC -Monitor for s/sx of bleeding  [facility], Marie.D., BCPS Clinical Pharmacist Pager: [phone] 08/01/2013 8:33 AM    Addendum: S/p cath - pharmacy to resume IV heparin 8 hrs post sheath removal. Sheath removed ~11:00  Resume IV  heparin at 1900 units/hr at 19:00 6 hr heparin level  [facility], Sobieski.D., BCPS Clinical Pharmacist Pager: [phone] 08/01/2013 11:55 AM

## 2013-08-01 NOTE — Progress Notes (Signed)
Had cardiac catheterization performed earlier this morning. Occluded diagonal graft. Continue with medical management, resuming IV heparin 8 hours after sheath pull.  Per nursing, still has oxygen demand/hypoxia. We will continue with IV Lasix and careful not to over diurese given his current contrast load. Underlying cardiomyopathy. Remote smoking history.  We will continue to monitor.  Donato Schultz, MD

## 2013-08-01 NOTE — Progress Notes (Signed)
Site area: right groin Site Prior to Removal:  Level 0 Pressure Applied For 20  MINUTES   Manual:   yes Patient Status During Pull:  stable Post Pull Groin Site:  Level 0 Post Pull Instructions Given: yes   Post Pull Pulses Present: yes and remained palpable during pressure maintained for sheath pull Dressing Applied:  Tegaderm Bedrest begins  11:00:00 Comments No complications

## 2013-08-01 NOTE — Progress Notes (Signed)
Pt has electronic cigarette at bedside. Nurse informed pt that he was not allowed to use electronic cigarette while in the hospital and educated pt about the importance of not smoking. Will continue to monitor pt.

## 2013-08-02 DIAGNOSIS — I2489 Other forms of acute ischemic heart disease: Secondary | ICD-10-CM

## 2013-08-02 DIAGNOSIS — R809 Proteinuria, unspecified: Secondary | ICD-10-CM

## 2013-08-02 DIAGNOSIS — I4891 Unspecified atrial fibrillation: Secondary | ICD-10-CM

## 2013-08-02 DIAGNOSIS — I200 Unstable angina: Secondary | ICD-10-CM

## 2013-08-02 DIAGNOSIS — I480 Paroxysmal atrial fibrillation: Secondary | ICD-10-CM | POA: Diagnosis present

## 2013-08-02 DIAGNOSIS — I248 Other forms of acute ischemic heart disease: Secondary | ICD-10-CM

## 2013-08-02 LAB — CBC
HEMATOCRIT: 44.8 % (ref 39.0–52.0)
HEMOGLOBIN: 14.8 g/dL (ref 13.0–17.0)
MCH: 29.8 pg (ref 26.0–34.0)
MCHC: 33 g/dL (ref 30.0–36.0)
MCV: 90.1 fL (ref 78.0–100.0)
Platelets: 169 10*3/uL (ref 150–400)
RBC: 4.97 MIL/uL (ref 4.22–5.81)
RDW: 13.9 % (ref 11.5–15.5)
WBC: 8.8 10*3/uL (ref 4.0–10.5)

## 2013-08-02 LAB — HEPARIN LEVEL (UNFRACTIONATED)
HEPARIN UNFRACTIONATED: 0.35 [IU]/mL (ref 0.30–0.70)
Heparin Unfractionated: 0.52 IU/mL (ref 0.30–0.70)

## 2013-08-02 LAB — GLUCOSE, CAPILLARY
GLUCOSE-CAPILLARY: 110 mg/dL — AB (ref 70–99)
Glucose-Capillary: 148 mg/dL — ABNORMAL HIGH (ref 70–99)
Glucose-Capillary: 162 mg/dL — ABNORMAL HIGH (ref 70–99)
Glucose-Capillary: 217 mg/dL — ABNORMAL HIGH (ref 70–99)

## 2013-08-02 MED ORDER — RIVAROXABAN 20 MG PO TABS
20.0000 mg | ORAL_TABLET | Freq: Every day | ORAL | Status: DC
  Administered 2013-08-02 – 2013-08-03 (×2): 20 mg via ORAL
  Filled 2013-08-02 (×3): qty 1

## 2013-08-02 MED ORDER — GLIPIZIDE 2.5 MG HALF TABLET
2.5000 mg | ORAL_TABLET | Freq: Every day | ORAL | Status: DC
  Administered 2013-08-02 – 2013-08-04 (×3): 2.5 mg via ORAL
  Filled 2013-08-02 (×4): qty 1

## 2013-08-02 MED ORDER — CARVEDILOL 6.25 MG PO TABS
6.2500 mg | ORAL_TABLET | ORAL | Status: AC
  Administered 2013-08-02: 6.25 mg via ORAL
  Filled 2013-08-02: qty 1

## 2013-08-02 MED ORDER — CARVEDILOL 6.25 MG PO TABS
6.2500 mg | ORAL_TABLET | Freq: Two times a day (BID) | ORAL | Status: DC
Start: 2013-08-02 — End: 2013-08-03
  Administered 2013-08-02 – 2013-08-03 (×2): 6.25 mg via ORAL
  Filled 2013-08-02 (×4): qty 1

## 2013-08-02 MED ORDER — LIVING WELL WITH DIABETES BOOK
Freq: Once | Status: DC
  Filled 2013-08-02: qty 1

## 2013-08-02 NOTE — Progress Notes (Signed)
Spoke with patient about the new onset diagnosis of diabetes.  States that his blood sugars have been elevated ( "up and down") in the past, but never has taken medication for DM.  HgbA1C is 8%.  Explained the importance of good blood sugar control and what HgbA1C signifies. Given a pamphlet about good diabetes care. Ordered Living Well with Diabetes booklet from pharmacy and ordered a dietician consult.  Will need a home blood glucose meter prescription for meter, lancets, and strips at discharge.  Stressed importance of seeing PCP regularly. Staff RNs to teach patient how to check own CBGs.  Patient can watch DM videos #[id number]. Will continue to follow while in hospital. Smith Mince RN BSN CDE

## 2013-08-02 NOTE — Care Management Note (Addendum)
    Page 1 of 1   08/03/2013     10:58:47 AM CARE MANAGEMENT NOTE 08/03/2013  Patient:  Larry Rangel, Larry Rangel   Account Number:  [id number]  Date Initiated:  08/02/2013  Documentation initiated by:  Junius Creamer  Subjective/Objective Assessment:   adm w heart failure     Action/Plan:   lives w wife, pcp dr s blythe   Anticipated DC Date:     Anticipated DC Plan:           Choice offered to / List presented to:             Status of service:   Medicare Important Message given?  YES (If response is "NO", the following Medicare IM given date fields will be blank) Date Medicare IM given:  08/02/2013 Medicare IM given by:  Junius Creamer Date Additional Medicare IM given:   Additional Medicare IM given by:    Discharge Disposition:    Per UR Regulation:  Reviewed for med. necessity/level of care/duration of stay  If discussed at Long Length of Stay Meetings, dates discussed:   08/04/2013    Comments:  7/22 1055 debbie Cashe Gatt rn.bsn has 35.00 per month copay w no prior auth req.gave pt 30day free xarelto card.

## 2013-08-02 NOTE — Progress Notes (Signed)
ANTICOAGULATION CONSULT NOTE - Follow Up Consult  Pharmacy Consult for heparin Indication: atrial fibrillation  Labs:  Recent Labs  07/30/13 1700 07/30/13 2346  07/31/13 0420  07/31/13 2156 08/01/13 0230 08/01/13 0435 08/02/13 0035 08/02/13 0037  HGB  --   --   < > 15.0  --   --  14.7  --   --  14.8  HCT  --   --   --  45.2  --   --  44.8  --   --  44.8  PLT  --   --   --  184  --   --  176  --   --  169  LABPROT  --   --   --   --   --   --   --  15.4*  --   --   INR  --   --   --   --   --   --   --  1.22  --   --   HEPARINUNFRC  --  0.28*  --   --   < > 0.31 0.34  --  0.35  --   CREATININE  --   --   --  1.03  --   --   --   --   --   --   TROPONINI 1.37* 2.01*  --  1.61*  --   --   --   --   --   --   < > = values in this interval not displayed.   Assessment/Plan:  63yo male therapeutic on heparin after resuming post-cath. Will continue gtt at current rate and confirm stable with additional level.   Vernard Gambles, PharmD, BCPS  08/02/2013,1:54 AM

## 2013-08-02 NOTE — Progress Notes (Addendum)
ANTICOAGULATION CONSULT NOTE - Follow-Up Consult  Pharmacy Consult for Heparin  Indication: Atrial fibrillation   No Known Allergies  Patient Measurements: Height: 6' 2.02" (188 cm) Weight: 226 lb 12.8 oz (102.876 kg) IBW/kg (Calculated) : 82.24 Heparin dosing weight: 103 kg  Vital Signs: Temp: 97.3 F (36.3 C) (07/21 0818) Temp src: Oral (07/21 0818) BP: 131/87 mmHg (07/21 0818) Pulse Rate: 90 (07/21 0818)  Labs:  Recent Labs  07/30/13 1700 07/30/13 2346  07/31/13 0420  08/01/13 0230 08/01/13 0435 08/02/13 0035 08/02/13 0037 08/02/13 0752  HGB  --   --   < > 15.0  --  14.7  --   --  14.8  --   HCT  --   --   --  45.2  --  44.8  --   --  44.8  --   PLT  --   --   --  184  --  176  --   --  169  --   LABPROT  --   --   --   --   --   --  15.4*  --   --   --   INR  --   --   --   --   --   --  1.22  --   --   --   HEPARINUNFRC  --  0.28*  --   --   < > 0.34  --  0.35  --  0.52  CREATININE  --   --   --  1.03  --   --   --   --   --   --   TROPONINI 1.37* 2.01*  --  1.61*  --   --   --   --   --   --   < > = values in this interval not displayed.  Estimated Creatinine Clearance: 91.5 ml/min (by C-G formula based on Cr of 1.03).    Medications:  . diltiazem (CARDIZEM) infusion 5 mg/hr (08/02/13 0800)  . heparin 1,900 Units/hr (08/02/13 0800)    Assessment: 63 YOM with Afib on IV heparin. CHA2DS2-VASc score is 4 (CHF, HTN, CAD, age). PE has been ruled out. He has severe 3V CAD s/p CABG per cath with plan to treat medically. H/H and plt wnl and no reported s/sx bleeding. Heparin level currently therapeutic on 1900 units/hr.  Goal of Therapy:  Heparin level 0.3-0.7 units/ml Monitor platelets by anticoagulation protocol: Yes   Plan:  -Continue IV heparin at 1900 units/hr -Daily heparin level and CBC -Monitor for s/sx of bleeding  [facility], [place].D., BCPS Clinical Pharmacist Pager: [phone] 08/02/2013 8:27 AM     Addendum:  Transitioning heparin  drip to Xarelto. Heparin drip will be continued until 17:00 when first dose of Xarelto is due with supper.  [place], [address].D., BCPS Clinical Pharmacist Pager: [phone] 08/02/2013 12:32 PM

## 2013-08-02 NOTE — Consult Note (Signed)
Patient Demographics  Larry Rangel, is a 63 y.o. male   MRN: [id number]   DOB - November 24, 1950  Admit Date - 07/30/2013    Outpatient Primary MD for the patient is Danise Edge, MD  Consult requested in the Hospital by Duke Salvia, MD, On 08/02/2013    Reason for consult DM2 management   With History of -  Past Medical History  Diagnosis Date  . Anxiety   . Hypertension   . Low back pain   . Tobacco user   . CAD (coronary artery disease) of artery bypass graft   . Hyperlipidemia   . S/P colonoscopy   . Cardiomyopathy     Echo 6/14: Mild LVH, EF 30-35%, diffuse HK, MAC, mild BAE  . Shortness of breath   . Myocardial infarction   . Arthritis   . Atrial flutter 07-2012    s/p ablation by Dr Graciela Husbands 08-06-2012  . Chicken pox as a child  . Measles as a child  . Mumps as a child  . Abnormal liver function 08/23/2010  . Medicare welcome exam 06/12/2013      Past Surgical History  Procedure Laterality Date  . Shoulder surgery    . Coronary artery bypass graft  2009    x 5  . Tee without cardioversion N/A 07/15/2012    Procedure: TRANSESOPHAGEAL ECHOCARDIOGRAM (TEE);  Surgeon: Vesta Mixer, MD;  Location: [facility] ENDOSCOPY;  Service: Cardiovascular;  Laterality: N/A;  . Cardioversion N/A 07/15/2012    Procedure: CARDIOVERSION;  Surgeon: Vesta Mixer, MD;  Location: [facility] ENDOSCOPY;  Service: Cardiovascular;  Laterality: N/A;  . Ablation of dysrhythmic focus  08/06/2012    CTI ablation by Dr Graciela Husbands  . Tonsillectomy    . Cardiac catheterization      in for   No chief complaint on file.    HPI  Larry Rangel  is a 63 y.o. male, with H/O CAD, Chr Systolic CHF, Afib, HTN, was admitted by cardiology for CHF, was noted to have an A1c of 8, I was called to assist with DM2 management, he underwent a Left Heart Cath yesterday,  feels better from CHF standpoint, currently symptom free.    Review of Systems    In addition to the HPI above,   No Fever-chills, No Headache, No changes with Vision or hearing, No problems swallowing food or Liquids, No Chest pain, Cough or Shortness of Breath, No Abdominal pain, No Nausea or Vommitting, Bowel movements are regular, No Blood in stool or Urine, No dysuria, No new skin rashes or bruises, No new joints pains-aches,  No new weakness, tingling, numbness in any extremity, No recent weight gain or loss, No polyuria, polydypsia or polyphagia, No significant Mental Stressors.  A full 10 point Review of Systems was done, except as stated above, all other Review of Systems were negative.   Social History History  Substance Use Topics  . Smoking status: Former Smoker -- 1.00 packs/day for 40 years    Types: Cigarettes  Quit date: 02/01/2011  . Smokeless tobacco: Never Used  . Alcohol Use: 14.4 oz/week    24 Glasses of wine per week     Comment: drinks 2 bottles of wine per week      Family History Family History  Problem Relation Age of Onset  . Alzheimer's disease Mother   . Heart failure Father 94  . Hypertension Father   . Hyperlipidemia Father   . Cancer Father     lung- took half of a young- smoker  . Heart disease Father     MI at 46  . Early death Neg Hx   . Kidney disease Neg Hx   . Stroke Neg Hx   . Leukemia Brother   . Drug abuse Daughter     heroine      Prior to Admission medications   Medication Sig Start Date End Date Taking? Authorizing Provider  amLODipine-valsartan (EXFORGE) 5-320 MG per tablet  01/23/13  Yes Historical Provider, MD  aspirin EC 81 MG tablet Take 1 tablet (81 mg total) by mouth daily. 08/07/12  Yes Clide Deutscher Serpe, PA-C  atorvastatin (LIPITOR) 20 MG tablet Take 1 tablet (20 mg total) by mouth daily. 06/23/13  Yes Bradd Canary, MD  busPIRone (BUSPAR) 10 MG tablet Take 1 tablet (10 mg total) by mouth 2 (two) times  daily. 04/08/13  Yes Bradd Canary, MD  clonazePAM (KLONOPIN) 0.5 MG tablet TAKE 1 TABLET BY MOUTH 3 TIMES A DAY AS NEEDED FOR ANXIETY   Yes Bradd Canary, MD  cyclobenzaprine (FLEXERIL) 10 MG tablet TAKE 1 TABLET BY MOUTH 3 TIMES A DAY AS NEEDED 11/13/12  Yes Etta Grandchild, MD  Omega-3 Fatty Acids (FISH OIL PO) Take 1 capsule by mouth daily.    Yes Historical Provider, MD  POTASSIUM PO Take 1 tablet by mouth daily. otc - pt does not know strength   Yes Historical Provider, MD  amLODipine-valsartan (EXFORGE) 5-320 MG per tablet TAKE 1 TABLET BY MOUTH EVERY DAY    Bradd Canary, MD    Anti-infectives   None      Scheduled Meds: . aspirin EC  81 mg Oral Daily  . atorvastatin  20 mg Oral q1800  . busPIRone  10 mg Oral BID  . carvedilol  6.25 mg Oral BID WC  . furosemide  40 mg Intravenous BID  . glipiZIDE  2.5 mg Oral QAC breakfast  . insulin aspart  0-15 Units Subcutaneous TID WC  . insulin aspart  0-5 Units Subcutaneous QHS  . irbesartan  300 mg Oral Daily  . omega-3 acid ethyl esters  1 g Oral Daily  . potassium chloride  20 mEq Oral Daily  . rivaroxaban  20 mg Oral Q supper   Continuous Infusions: . heparin 1,900 Units/hr (08/02/13 0800)   PRN Meds:.sodium chloride, acetaminophen, ALPRAZolam, clonazePAM, cyclobenzaprine, nitroGLYCERIN, ondansetron (ZOFRAN) IV, zolpidem  No Known Allergies  Physical Exam  Vitals  Blood pressure 131/87, pulse 90, temperature 97.3 F (36.3 C), temperature source Oral, resp. rate 20, height 6' 2.02" (1.88 m), weight 102.876 kg (226 lb 12.8 oz), SpO2 96.00%.   1. General middle aged white male lying in bed in NAD,    2. Normal affect and insight, Not Suicidal or Homicidal, Awake Alert, Oriented X 3.  3. No F.N deficits, ALL C.Nerves Intact, Strength 5/5 all 4 extremities, Sensation intact all 4 extremities, Plantars down going.  4. Ears and Eyes appear Normal, Conjunctivae clear, PERRLA. Moist Oral Mucosa.  5. Supple Neck, No JVD, No  cervical lymphadenopathy appriciated, No Carotid Bruits.  6. Symmetrical Chest wall movement, Good air movement bilaterally, CTAB.  7. RRR, No Gallops, Rubs or Murmurs, No Parasternal Heave.  8. Positive Bowel Sounds, Abdomen Soft, No tenderness, No organomegaly appriciated,No rebound -guarding or rigidity.  9.  No Cyanosis, Normal Skin Turgor, No Skin Rash or Bruise.  10. Good muscle tone,  joints appear normal , no effusions, Normal ROM.  11. No Palpable Lymph Nodes in Neck or Axillae     Data Review  CBC  Recent Labs Lab 07/31/13 0420 08/01/13 0230 08/02/13 0037  WBC 11.8* 9.9 8.8  HGB 15.0 14.7 14.8  HCT 45.2 44.8 44.8  PLT 184 176 169  MCV 88.6 89.8 90.1  MCH 29.4 29.5 29.8  MCHC 33.2 32.8 33.0  RDW 13.9 13.8 13.9   ------------------------------------------------------------------------------------------------------------------  Chemistries   Recent Labs Lab 07/31/13 0420  NA 140  K 4.1  CL 101  CO2 22  GLUCOSE 166*  BUN 18  CREATININE 1.03  CALCIUM 8.6  AST 48*  ALT 60*  ALKPHOS 68  BILITOT 1.1   ------------------------------------------------------------------------------------------------------------------ estimated creatinine clearance is 91.5 ml/min (by C-G formula based on Cr of 1.03). ------------------------------------------------------------------------------------------------------------------  Recent Labs  07/31/13 0420  TSH 1.190     Coagulation profile  Recent Labs Lab 08/01/13 0435  INR 1.22   ------------------------------------------------------------------------------------------------------------------- No results found for this basename: DDIMER,  in the last 72 hours -------------------------------------------------------------------------------------------------------------------  Cardiac Enzymes  Recent Labs Lab 07/30/13 1700 07/30/13 2346 07/31/13 0420  TROPONINI 1.37* 2.01* 1.61*    ------------------------------------------------------------------------------------------------------------------ No components found with this basename: POCBNP,    ---------------------------------------------------------------------------------------------------------------  Urinalysis    Component Value Date/Time   COLORURINE LT. YELLOW 11/12/2012 1648   APPEARANCEUR CLEAR 11/12/2012 1648   LABSPEC 1.020 11/12/2012 1648   PHURINE 6.5 11/12/2012 1648   GLUCOSEU NEGATIVE 11/12/2012 1648   GLUCOSEU NEGATIVE 01/22/2007 1111   HGBUR NEGATIVE 11/12/2012 1648   BILIRUBINUR NEGATIVE 11/12/2012 1648   KETONESUR TRACE 11/12/2012 1648   PROTEINUR NEGATIVE 01/22/2007 1111   UROBILINOGEN 2.0 11/12/2012 1648   NITRITE NEGATIVE 11/12/2012 1648   LEUKOCYTESUR NEGATIVE 11/12/2012 1648     Imaging results:   Ct Angio Chest Pe W/cm &/or Wo Cm  07/30/2013   CLINICAL DATA:  Severe shortness of breath. Elevated D-dimer. Clinical suspicion for pulmonary embolism.  EXAM: CT ANGIOGRAPHY CHEST WITH CONTRAST  TECHNIQUE: Multidetector CT imaging of the chest was performed using the standard protocol during bolus administration of intravenous contrast. Multiplanar CT image reconstructions and MIPs were obtained to evaluate the vascular anatomy.  CONTRAST:  80mL OMNIPAQUE IOHEXOL 350 MG/ML SOLN  COMPARISON:  12/31/2006  FINDINGS: Satisfactory opacification of pulmonary arteries noted, and no pulmonary emboli identified.  Diffuse interstitial infiltrates are seen as well as small bilateral pleural effusions, suspicious for pulmonary edema secondary to congestive heart failure. Mild cardiomegaly is noted. Atelectasis is noted in both lung bases. There is also patchy areas of airspace disease seen involving the right lung greater than left, and superimposed pneumonia cannot definitely be excluded.  No evidence of hilar or mediastinal lymphadenopathy. No adenopathy seen elsewhere within the thorax. No suspicious bone  lesions identified.  Review of the MIP images confirms the above findings.  IMPRESSION: No evidence of pulmonary embolism.  Cardiomegaly, diffuse interstitial infiltrates, and small bilateral pleural effusions, consistent with congestive heart failure.  Patchy areas of airspace disease also seen in both lungs ; superimposed pneumonia cannot definitely be excluded.  Chest radiographic followup is recommended.   Electronically Signed   By: Myles Rosenthal M.D.   On: 07/30/2013 18:05         Assessment & Plan   1. CAD - NSTEMI, Acute on Chr Systolic CHF Ef 35% - per primary team (Cards).    2.  DM2 - since had Angiogram yesterday avoid Glucophage, start on Glucotrol + ISS, QAC - HS CBGs, DM educator requested, can be DC'd on present dose Glucotrol along with Glucometer along with testing supplies for QAC-HS CBGs, outpatient Opthalmology follow up and UA with PCP.    3.Dyslipidemia - on statin    4.HTN - on coreg on ARB.    5. A.Flutter - on Coreg, Xaralto, ? Hep gtt , will let Cards know.    6.? infiltrates on CT - likely CHF, repeat 2 view CXR in am.     DVT Prophylaxis Heparin    AM Labs Ordered, also please review Full Orders  Family Communication: Plan discussed with patient and wife  Thank you for the consult, we will follow the patient with you in the Hospital.   Susa Raring K M.D on 08/02/2013 at 12:02 PM  Between 7am to 7pm - Pager - [phone]  After 7pm go to [email or web address] - password [email or web address]  And look for the night coverage person covering me after hours   Thank you for the consult, we will follow the patient with you in the Hospital.   [facility] Office  [phone]   **Disclaimer: This note may have been dictated with voice recognition software. Similar sounding words can inadvertently be transcribed and this note may contain transcription errors which may not have been corrected upon publication of note.**

## 2013-08-02 NOTE — Progress Notes (Addendum)
Cardiologist: Dr. Berton Mount  Subjective:  Feels better. Less short of breath. Over the last several days however he is had significant shortness of breath. He is having frequent urination with his Lasix.  Objective:  Vital Signs in the last 24 hours: Temp:  [97 F (36.1 C)-97.8 F (36.6 C)] 97.3 F (36.3 C) (07/21 0818) Pulse Rate:  [53-91] 90 (07/21 0818) Resp:  [15-27] 20 (07/21 0818) BP: (88-131)/(53-96) 131/87 mmHg (07/21 0818) SpO2:  [93 %-100 %] 96 % (07/21 0818) Weight:  [226 lb 12.8 oz (102.876 kg)] 226 lb 12.8 oz (102.876 kg) (07/21 0500)  Intake/Output from previous day: 07/20 0701 - 07/21 0700 In: 2371.4 [P.O.:1250; I.V.:1121.4] Out: 3850 [Urine:3850]   Physical Exam: General: Well developed, well nourished, in no acute distress. Head:  Mid neck JVD Normocephalic and atraumatic. Lungs: Clear to auscultation and percussion. Heart: Irregularly irregular, mildly tachycardic.  No murmur, rubs or gallops.  Abdomen: soft, non-tender, positive bowel sounds. Extremities: No clubbing or cyanosis. No edema. Neurologic: Alert and oriented x 3.    Lab Results:  Recent Labs  08/01/13 0230 08/02/13 0037  WBC 9.9 8.8  HGB 14.7 14.8  PLT 176 169    Recent Labs  07/31/13 0420  NA 140  K 4.1  CL 101  CO2 22  GLUCOSE 166*  BUN 18  CREATININE 1.03     Recent Labs  07/30/13 2346 07/31/13 0420  TROPONINI 2.01* 1.61*   Hepatic Function Panel  Recent Labs  07/31/13 0420  PROT 6.2  ALBUMIN 3.1*  AST 48*  ALT 60*  ALKPHOS 68  BILITOT 1.1    Hemoglobin A1c 8.0  Telemetry: Atrial fibrillation, heart rate low 100s Personally viewed.   EKG:  Atrial fibrillation left bundle-branch block, heart rate 80  Cardiac Studies:  CATH 08/01/13 - occluded SVG to diag and PL. Native flow however. Medical management. Left ventricular end-diastolic pressure 32 mm mercury.  Echocardiogram-pending. Previous from 2014 showed 40% EF  . aspirin EC  81 mg Oral Daily    . atorvastatin  20 mg Oral q1800  . busPIRone  10 mg Oral BID  . carvedilol  3.125 mg Oral BID WC  . furosemide  40 mg Intravenous BID  . insulin aspart  0-15 Units Subcutaneous TID WC  . insulin aspart  0-5 Units Subcutaneous QHS  . irbesartan  300 mg Oral Daily  . omega-3 acid ethyl esters  1 g Oral Daily  . potassium chloride  20 mEq Oral Daily    Assessment/Plan:   63 year old male with coronary artery disease status post bypass with acute on chronic systolic heart failure, demand ischemia, catheterization revealing occluded diagonal and posterior lateral graft with continued medical management.  1. Acute on chronic systolic heart failure-we will continue with IV Lasix. Total out a proximally 3 L with 40 mg IV twice a day. Continue. Check echocardiogram, previous EF 40%. Continue with angiotensin receptor blocker irbesartan at goal 300 mg.. I will stop IV diltiazem at 5 mg per hour for his atrial fibrillation given his decreased ejection fraction and I will increase his carvedilol to 6.25 twice a day.  2. Coronary artery disease-cardiac catheterization reviewed, occluded PL/diagonal graft. Medical management. No intervention took place.  3. Ischemic cardiomyopathy-prior ejection fraction 40%. Repeating echocardiogram.  4. Diabetes-monitoring. Insulin sliding scale, hemoglobin A1c 8.0. He is not on any medications at home. I will consult hospitalist for diabetes management.  5. Hyperlipidemia-continue with atorvastatin-LDL 42  6. Atrial flutter-with his systolic dysfunction, heart failure  and, hypertension age 63 (CHADS-VASC of 3) he deserves anticoagulation. Currently on heparin IV. I will start Xarelto 20mg  this evening. We will discontinue heparin IV at that time. Reviewed ECG's - atrial flutter present. Has had prior flutter ablation on 08/06/12. At that time, he had resting sinus bradycardia in the 40s.  7. Demand ischemia/troponin mildly elevated in the setting of acute systolic  heart failure.  Had lengthy discussion with he and his wife. Discussed diet, salt restrictions, hospital course.  I will forward note to Dr. Graciela HusbandsKlein.   Larry Rangel 08/02/2013, 9:39 AM

## 2013-08-02 NOTE — Clinical Documentation Improvement (Signed)
Presents with Acute on Chronic Systolic CHF, Atrial Flutter; had Cath.   Respiratory rates ranging from 15 to 30  Requiring 3 to 6L of FiO2 while in-house  Per nursing, still has oxygen demand/hypoxia  Please clarify with a diagnosis if the patient has any of the below conditions secondary to the clinical data and treatment provided. Please document findings in Progress Note and include in Discharge Summary if indicated.   Acute Respiratory Failure Acute on Chronic Respiratory Failure Chronic Respiratory Failure Other Condition  Thank You, Shellee Milo ,RN Clinical Documentation Specialist:  [phone]  [facility]- Health Information Management

## 2013-08-03 ENCOUNTER — Inpatient Hospital Stay (HOSPITAL_COMMUNITY): Payer: Medicare HMO

## 2013-08-03 DIAGNOSIS — I517 Cardiomegaly: Secondary | ICD-10-CM

## 2013-08-03 LAB — GLUCOSE, CAPILLARY
GLUCOSE-CAPILLARY: 113 mg/dL — AB (ref 70–99)
GLUCOSE-CAPILLARY: 132 mg/dL — AB (ref 70–99)
Glucose-Capillary: 140 mg/dL — ABNORMAL HIGH (ref 70–99)
Glucose-Capillary: 135 mg/dL — ABNORMAL HIGH (ref 70–99)

## 2013-08-03 MED ORDER — CARVEDILOL 12.5 MG PO TABS
12.5000 mg | ORAL_TABLET | Freq: Two times a day (BID) | ORAL | Status: DC
  Administered 2013-08-03 – 2013-08-04 (×2): 12.5 mg via ORAL
  Filled 2013-08-03 (×4): qty 1

## 2013-08-03 MED ORDER — PERFLUTREN LIPID MICROSPHERE
INTRAVENOUS | Status: AC
  Filled 2013-08-03: qty 10

## 2013-08-03 MED ORDER — PERFLUTREN LIPID MICROSPHERE
1.0000 mL | INTRAVENOUS | Status: AC | PRN
  Administered 2013-08-03: 2 mL via INTRAVENOUS
  Filled 2013-08-03: qty 10

## 2013-08-03 MED ORDER — FUROSEMIDE 40 MG PO TABS
40.0000 mg | ORAL_TABLET | Freq: Two times a day (BID) | ORAL | Status: DC
  Administered 2013-08-03 – 2013-08-04 (×2): 40 mg via ORAL
  Filled 2013-08-03 (×3): qty 1

## 2013-08-03 NOTE — Discharge Instructions (Addendum)
Information on my medicine - XARELTO (Rivaroxaban)  This medication education was reviewed with me or my healthcare representative as part of my discharge preparation.  The pharmacist that spoke with me during my hospital stay was:  [facility], Maryanna Shape, [facility]  Why was Xarelto prescribed for you? Xarelto was prescribed for you to reduce the risk of a blood clot forming that can cause a stroke if you have a medical condition called atrial fibrillation (a type of irregular heartbeat).  What do you need to know about xarelto ? Take your Xarelto ONCE DAILY at the same time every day with your evening meal. If you have difficulty swallowing the tablet whole, you may crush it and mix in applesauce just prior to taking your dose.  Take Xarelto exactly as prescribed by your doctor and DO NOT stop taking Xarelto without talking to the doctor who prescribed the medication.  Stopping without other stroke prevention medication to take the place of Xarelto may increase your risk of developing a clot that causes a stroke.  Refill your prescription before you run out.  After discharge, you should have regular check-up appointments with your healthcare provider that is prescribing your Xarelto.  In the future your dose may need to be changed if your kidney function or weight changes by a significant amount.  What do you do if you miss a dose? If you are taking Xarelto ONCE DAILY and you miss a dose, take it as soon as you remember on the same day then continue your regularly scheduled once daily regimen the next day. Do not take two doses of Xarelto at the same time or on the same day.   Important Safety Information A possible side effect of Xarelto is bleeding. You should call your healthcare provider right away if you experience any of the following:   Bleeding from an injury or your nose that does not stop.   Unusual colored urine (red or dark brown) or unusual colored stools (red or black).   Unusual bruising for unknown reasons.   A serious fall or if you hit your head (even if there is no bleeding).  Some medicines may interact with Xarelto and might increase your risk of bleeding while on Xarelto. To help avoid this, consult your healthcare provider or pharmacist prior to using any new prescription or non-prescription medications, including herbals, vitamins, non-steroidal anti-inflammatory drugs (NSAIDs) and supplements.  This website has more information on Xarelto: [email or web address].br.     Atrial Flutter Atrial flutter is a heart rhythm that can cause the heart to beat very fast (tachycardia). It originates in the upper chambers of the heart (atria). In atrial flutter, the top chambers of the heart (atria) often beat much faster than the bottom chambers of the heart (ventricles). Atrial flutter has a regular "saw toothed" appearance in an EKG readout. An EKG is a test that records the electrical activity of the heart. Atrial flutter can cause the heart to beat up to 150 beats per minute (BPM). Atrial flutter can either be short lived (paroxysmal) or permanent.  CAUSES  Causes of atrial flutter can be many. Some of these include:  Heart related issues:  Heart attack (myocardial infarction).  Heart failure.  Heart valve problems.  Poorly controlled high blood pressure (hypertension).  After open heart surgery.  Lung related issues:  A blood clot in the lungs (pulmonary embolism).  Chronic obstructive pulmonary disease (COPD). Medications used to treat COPD can attribute to atrial flutter.  Other related causes:  Hyperthyroidism.  Caffeine.  Some decongestant cold medications.  Low electrolyte levels such as potassium or magnesium.  Cocaine. SYMPTOMS  An awareness of your heart beating rapidly (palpitations).  Shortness of breath.  Chest pain.  Low blood pressure (hypotension).  Dizziness or fainting. DIAGNOSIS  Different tests can be performed  to diagnose atrial flutter.   An EKG.  Holter monitor. This is a 24-hour recording of your heart rhythm. You will also be given a diary. Write down all symptoms that you have and what you were doing at the time you experienced symptoms.  Cardiac event monitor. This small device can be worn for up to 30 days. When you have heart symptoms, you will push a button on the device. This will then record your heart rhythm.  Echocardiogram. This is an imaging test to look at your heart. Your caregiver will look at your heart valves and the ventricles.  Stress test. This test can help determine if the atrial flutter is related to exercise or if coronary artery disease is present.  Laboratory studies will look at certain blood levels like:  Complete blood count (CBC).  Potassium.  Magnesium.  Thyroid function. TREATMENT  Treatment of atrial flutter varies. A combination of therapies may be used or sometimes atrial flutter may need only 1 type of treatment.  Lab work: If your blood work, such as your electrolytes (potassium, magnesium) or your thyroid function tests, are abnormal, your caregiver will treat them accordingly.  Medication:  There are several different types of medications that can convert your heart to a normal rhythm and prevent atrial flutter from reoccurring.  Nonsurgical procedures: Nonsurgical techniques may be used to control atrial flutter. Some examples include:  Cardioversion. This technique uses either drugs or an electrical shock to restore a normal heart rhythm:  Cardioversion drugs may be given through an intravenous (IV) line to help "reset" the heart rhythm.  In electrical cardioversion, your caregiver shocks your heart with electrical energy. This helps to reset the heartbeat to a normal rhythm.  Ablation. If atrial flutter is a persistent problem, an ablation may be needed. This procedure is done under mild sedation. High frequency radio-wave energy is used to  destroy the area of heart tissue responsible for atrial flutter. SEEK IMMEDIATE MEDICAL CARE IF:  You have:  Dizziness.  Near fainting or fainting.  Shortness of breath.  Chest pain or pressure.  Sudden nausea or vomiting.  Profuse sweating. If you have the above symptoms, call your local emergency service immediately! Do not drive yourself to the hospital. MAKE SURE YOU:   Understand these instructions.  Will watch your condition.  Will get help right away if you are not doing well or get worse. Document Released: 05/18/2008 Document Revised: 05/16/2013 Document Reviewed: 05/18/2008 [facility] Patient Information 2015 [employer], [place]. This information is not intended to replace advice given to you by your health care provider. Make sure you discuss any questions you have with your health care provider.  No driving for 24 hours. No lifting over 5 lbs for 1 week. No sexual activity for 1 week. Keep procedure site clean & dry. If you notice increased pain, swelling, bleeding or pus, call/return!  You may shower, but no soaking baths/hot tubs/pools for 1 week.

## 2013-08-03 NOTE — Consult Note (Signed)
[place] TEAM 1 - Stepdown/ICU TEAM Progress Note  Larry Rangel DGL:[id number] DOB: February 15, 1950 DOA: 07/30/2013 PCP: Danise Edge, MD  Admit HPI / Brief Narrative: 63 y.o. male, with H/O CAD, Chr Systolic CHF, Afib, HTN, was admitted by cardiology for CHF, was noted to have an A1c of 8.  [facility] was called to assist with DM2 management.    HPI/Subjective: No new complaints today.  Denies specific questions.  States he is "getting the hang of things" in regard to his DM diagnosis.  Recomendations:  Newly diagnosed DM2  started on Glucotrol + SSI - DM educator requested - outpatient Opthalmology follow up and UA with PCP suggested for routine DM care - CBG reasonably controlled at this time   Dyslipidemia on statin  HTN on coreg + ARB  ? infiltrates on CT  F/u CXR suggestive of pulmonary edema - being diuresed per primary service - no clinical findings to suggest PNA  Code Status: FULL Family Communication: no family present at time of exam  Antibiotics: none  DVT prophylaxis: xarelto  Objective: Blood pressure 119/82, pulse 91, temperature 97.3 F (36.3 C), temperature source Oral, resp. rate 20, height 6' 2.02" (1.88 m), weight 102.059 kg (225 lb), SpO2 99.00%.  Intake/Output Summary (Last 24 hours) at 08/03/13 1828 Last data filed at 08/03/13 1800  Gross per 24 hour  Intake 722.48 ml  Output   2770 ml  Net -2047.52 ml    Exam: General: No acute respiratory distress Lungs: Clear to auscultation bilaterally without wheezes or crackles Cardiovascular: Regular rate and rhythm without murmur gallop or rub normal S1 and S2 Abdomen: Nontender, nondistended, soft, bowel sounds positive, no rebound, no ascites, no appreciable mass Extremities: No significant cyanosis, clubbing, or edema bilateral lower extremities  Data Reviewed: Basic Metabolic Panel:  Recent Labs Lab 07/31/13 0420  NA 140  K 4.1  CL 101  CO2 22  GLUCOSE 166*  BUN 18  CREATININE 1.03    CALCIUM 8.6    Liver Function Tests:  Recent Labs Lab 07/31/13 0420  AST 48*  ALT 60*  ALKPHOS 68  BILITOT 1.1  PROT 6.2  ALBUMIN 3.1*   Coags:  Recent Labs Lab 08/01/13 0435  INR 1.22   CBC:  Recent Labs Lab 07/31/13 0420 08/01/13 0230 08/02/13 0037  WBC 11.8* 9.9 8.8  HGB 15.0 14.7 14.8  HCT 45.2 44.8 44.8  MCV 88.6 89.8 90.1  PLT 184 176 169    Cardiac Enzymes:  Recent Labs Lab 07/30/13 1700 07/30/13 2346 07/31/13 0420  TROPONINI 1.37* 2.01* 1.61*   CBG:  Recent Labs Lab 08/02/13 1642 08/02/13 2115 08/03/13 0746 08/03/13 1130 08/03/13 1616  GLUCAP 110* 217* 140* 113* 135*    Recent Results (from the past 240 hour(s))  MRSA PCR SCREENING     Status: None   Collection Time    07/30/13  3:48 PM      Result Value Ref Range Status   MRSA by PCR NEGATIVE  NEGATIVE Final   Comment:            The GeneXpert MRSA Assay (FDA     approved for NASAL specimens     only), is one component of a     comprehensive MRSA colonization     surveillance program. It is not     intended to diagnose MRSA     infection nor to guide or     monitor treatment for     MRSA infections.  Studies:  Recent x-ray studies have been reviewed in detail by the Attending Physician  Scheduled Meds:  Scheduled Meds: . aspirin EC  81 mg Oral Daily  . atorvastatin  20 mg Oral q1800  . busPIRone  10 mg Oral BID  . carvedilol  12.5 mg Oral BID WC  . furosemide  40 mg Oral BID  . glipiZIDE  2.5 mg Oral QAC breakfast  . insulin aspart  0-15 Units Subcutaneous TID WC  . insulin aspart  0-5 Units Subcutaneous QHS  . irbesartan  300 mg Oral Daily  . living well with diabetes book   Does not apply Once  . omega-3 acid ethyl esters  1 g Oral Daily  . potassium chloride  20 mEq Oral Daily  . rivaroxaban  20 mg Oral Q supper    Time spent on care of this patient: 25 mins   Myreon Wimer T , MD   [facility] Office  [phone] Pager - Text Page per  Loretha StaplerAmion as per below:  On-Call/Text Page:      [email or web address]      password [email or web address]  If 7PM-7AM, please contact night-coverage [email or web address] Password [email or web address] 08/03/2013, 6:28 PM   LOS: 4 days

## 2013-08-03 NOTE — Plan of Care (Signed)
Problem: Food- and Nutrition-Related Knowledge Deficit (NB-1.1) Goal: Nutrition education Formal process to instruct or train a patient/client in a skill or to impart knowledge to help patients/clients voluntarily manage or modify food choices and eating behavior to maintain or improve health. Outcome: Completed/Met Date Met:  08/03/13  RD consulted for nutrition education regarding diabetes.     Lab Results  Component Value Date    HGBA1C 8.0* 07/30/2013    RD provided "Carbohydrate Counting for People with Diabetes" handout from the Academy of Nutrition and Dietetics. Discussed different food groups and their effects on blood sugar, emphasizing carbohydrate-containing foods. Provided list of carbohydrates and recommended serving sizes of common foods.  Discussed importance of controlled and consistent carbohydrate intake throughout the day. Provided examples of ways to balance meals/snacks and encouraged intake of high-fiber, whole grain complex carbohydrates. Teach back method used.  Also reviewed Low sodium guidelines and answered patient's questions regarding sodium and carbohydrates. Will order OP diet education consult.  Expect good compliance.  Body mass index is 28.88 kg/(m^2). Pt meets criteria for overweight based on current BMI.  Current diet order is Heart Healthy CHO Modified, patient is consuming approximately 100% of meals at this time. Labs and medications reviewed. No further nutrition interventions warranted at this time. RD contact information provided. If additional nutrition issues arise, please re-consult RD.  Molli Barrows, RD, LDN, [place] Pager [phone] After Hours Pager [phone]

## 2013-08-03 NOTE — Progress Notes (Signed)
Echocardiogram 2D Echocardiogram has been performed.  08/03/2013 12:05 PM Gertie Fey, RVT, RDCS, RDMS

## 2013-08-03 NOTE — Progress Notes (Signed)
Cardiologist: Dr. Berton Mount  Subjective:  Feels better. Sitting up in chair, has been about without significant shortness of breath.   Objective:  Vital Signs in the last 24 hours: Temp:  [97.5 F (36.4 C)-98 F (36.7 C)] 97.9 F (36.6 C) (07/22 1130) Pulse Rate:  [83-96] 89 (07/22 1130) Resp:  [15-25] 21 (07/22 1130) BP: (105-138)/(49-89) 114/81 mmHg (07/22 1130) SpO2:  [94 %-98 %] 98 % (07/22 1130) Weight:  [225 lb (102.059 kg)] 225 lb (102.059 kg) (07/22 0300)  Intake/Output from previous day: 07/21 0701 - 07/22 0700 In: 1715.6 [P.O.:1320; I.V.:395.6] Out: 2171 [Urine:2170; Stool:1]   Physical Exam: General: Well developed, well nourished, in no acute distress. Head:  Mid neck JVD Normocephalic and atraumatic. Lungs: Clear to auscultation and percussion. Heart: Regular rate and rhythm.  No murmur, rubs or gallops.  Abdomen: soft, non-tender, positive bowel sounds. Extremities: No clubbing or cyanosis. No edema. Neurologic: Alert and oriented x 3.    Lab Results:  Recent Labs  08/01/13 0230 08/02/13 0037  WBC 9.9 8.8  HGB 14.7 14.8  PLT 176 169    Hemoglobin A1c 8.0  Telemetry: 08/02/13 Atrial fibrillation, heart rate low 100s, 08/03/13-currently sinus rhythm heart rate 89. Personally viewed.   EKG:  Atrial fibrillation left bundle-branch block, heart rate 80  Cardiac Studies:  CATH 08/01/13 - occluded SVG to diag and PL. Native flow however. Medical management. Left ventricular end-diastolic pressure 32 mm mercury.  Echocardiogram-15% EF per sonographer, no thrombus final read pending. Previous from 2014 showed 40% EF  . aspirin EC  81 mg Oral Daily  . atorvastatin  20 mg Oral q1800  . busPIRone  10 mg Oral BID  . carvedilol  6.25 mg Oral BID WC  . furosemide  40 mg Intravenous BID  . glipiZIDE  2.5 mg Oral QAC breakfast  . insulin aspart  0-15 Units Subcutaneous TID WC  . insulin aspart  0-5 Units Subcutaneous QHS  . irbesartan  300 mg Oral Daily  .  living well with diabetes book   Does not apply Once  . omega-3 acid ethyl esters  1 g Oral Daily  . potassium chloride  20 mEq Oral Daily  . rivaroxaban  20 mg Oral Q supper    Assessment/Plan:   63 year old male with coronary artery disease status post bypass with acute on chronic systolic heart failure, demand ischemia, catheterization revealing occluded diagonal and posterior lateral graft with continued medical management.  1. Acute on chronic systolic heart failure-we will transition him to by mouth Lasix, 40 mg twice a day. Total out a proximally 5 L with 40 mg IV twice a day. Check echocardiogram, previous EF 40%. Continue with angiotensin receptor blocker irbesartan at goal 300 mg. I will increase his carvedilol to 12.5 mg twice a day.  2. Coronary artery disease-cardiac catheterization reviewed, occluded PL/diagonal graft. Medical management. No intervention took place.  3. Ischemic cardiomyopathy-prior ejection fraction 40%. Repeat echocardiogram demonstrates worsened EF possibly 15%, no thrombus. Up titrate medications.  4. Diabetes-monitoring. Insulin sliding scale, hemoglobin A1c 8.0. He is not on any medications at home. Diabetes per hospitalist team, diabetic management. Thank you.  5. Hyperlipidemia-continue with atorvastatin-LDL 42  6. Atrial flutter-please see EKG from 08/01/13 at 0707. With his systolic dysfunction, heart failure and, hypertension age 22 (CHADS-VASC of 3) he deserves anticoagulation.  Xarelto 20mg  this evening. Reviewed ECG's - atrial flutter present. Has had prior flutter ablation on 08/06/12. At that time, he had resting sinus bradycardia in the  40s.  7. Demand ischemia/troponin mildly elevated in the setting of acute systolic heart failure.  Had lengthy discussion with he and his wife. Discussed diet, salt restrictions, hospital course.  Hopeful discharge tomorrow.   Galya Dunnigan 08/03/2013, 12:55 PM

## 2013-08-04 ENCOUNTER — Other Ambulatory Visit: Payer: Self-pay | Admitting: Physician Assistant

## 2013-08-04 ENCOUNTER — Telehealth: Payer: Self-pay | Admitting: Internal Medicine

## 2013-08-04 DIAGNOSIS — E1159 Type 2 diabetes mellitus with other circulatory complications: Secondary | ICD-10-CM

## 2013-08-04 DIAGNOSIS — I5043 Acute on chronic combined systolic (congestive) and diastolic (congestive) heart failure: Secondary | ICD-10-CM

## 2013-08-04 DIAGNOSIS — E785 Hyperlipidemia, unspecified: Secondary | ICD-10-CM

## 2013-08-04 LAB — BASIC METABOLIC PANEL
Anion gap: 13 (ref 5–15)
BUN: 18 mg/dL (ref 6–23)
CO2: 26 mEq/L (ref 19–32)
Calcium: 9.3 mg/dL (ref 8.4–10.5)
Chloride: 102 mEq/L (ref 96–112)
Creatinine, Ser: 1.05 mg/dL (ref 0.50–1.35)
GFR calc Af Amer: 84 mL/min — ABNORMAL LOW (ref >90)
GFR calc non Af Amer: 73 mL/min — ABNORMAL LOW (ref >90)
Glucose, Bld: 136 mg/dL — ABNORMAL HIGH (ref 70–99)
Potassium: 4.1 mEq/L (ref 3.7–5.3)
Sodium: 141 mEq/L (ref 137–147)

## 2013-08-04 LAB — GLUCOSE, CAPILLARY
Glucose-Capillary: 140 mg/dL — ABNORMAL HIGH (ref 70–99)
Glucose-Capillary: 82 mg/dL (ref 70–99)

## 2013-08-04 MED ORDER — CARVEDILOL 12.5 MG PO TABS: 12.5000 mg | ORAL_TABLET | Freq: Two times a day (BID) | ORAL | Status: DC

## 2013-08-04 MED ORDER — IRBESARTAN 300 MG PO TABS: 300.0000 mg | ORAL_TABLET | Freq: Every day | ORAL | Status: DC

## 2013-08-04 MED ORDER — RIVAROXABAN 20 MG PO TABS: 20.0000 mg | ORAL_TABLET | Freq: Every day | ORAL | Status: DC

## 2013-08-04 MED ORDER — FUROSEMIDE 40 MG PO TABS: 40.0000 mg | ORAL_TABLET | Freq: Every day | ORAL | Status: DC

## 2013-08-04 MED ORDER — METFORMIN HCL 500 MG PO TABS: 500.0000 mg | ORAL_TABLET | Freq: Two times a day (BID) | ORAL | Status: DC

## 2013-08-04 MED ORDER — METFORMIN HCL 500 MG PO TABS
500.0000 mg | ORAL_TABLET | Freq: Two times a day (BID) | ORAL | Status: DC
  Filled 2013-08-04 (×2): qty 1

## 2013-08-04 MED ORDER — GLIPIZIDE 2.5 MG HALF TABLET: 2.5000 mg | ORAL_TABLET | Freq: Every day | ORAL | Status: DC

## 2013-08-04 MED ORDER — POTASSIUM CHLORIDE CRYS ER 20 MEQ PO TBCR: 20.0000 meq | EXTENDED_RELEASE_TABLET | Freq: Every day | ORAL | Status: DC

## 2013-08-04 NOTE — Telephone Encounter (Signed)
New message     7 days per [facility]. On 8/3 @ 11:30 am

## 2013-08-04 NOTE — Progress Notes (Addendum)
Cardiologist: Dr. Berton Mount  Subjective:  Feels better. Ready to go home.   Objective:  Vital Signs in the last 24 hours: Temp:  [97.3 F (36.3 C)-98.2 F (36.8 C)] 97.7 F (36.5 C) (07/23 0753) Pulse Rate:  [91-95] 95 (07/23 0753) Resp:  [20] 20 (07/22 1613) BP: (99-128)/(73-96) 128/96 mmHg (07/23 0753) SpO2:  [98 %-99 %] 98 % (07/23 0753) Weight:  [224 lb 10.4 oz (101.9 kg)] 224 lb 10.4 oz (101.9 kg) (07/23 0416)  Intake/Output from previous day: 07/22 0701 - 07/23 0700 In: 550 [P.O.:550] Out: 2225 [Urine:2225]   Physical Exam: General: Well developed, well nourished, in no acute distress. Head:  Mid neck JVD Normocephalic and atraumatic. Lungs: Clear to auscultation and percussion. Heart: Regular rate and rhythm.  No murmur, rubs or gallops. Rash left of sternum, heat rash, gets every summer, not shingles he states Abdomen: soft, non-tender, positive bowel sounds. Extremities: No clubbing or cyanosis. No edema. Neurologic: Alert and oriented x 3.    Lab Results:  Recent Labs  08/02/13 0037  WBC 8.8  HGB 14.8  PLT 169    Hemoglobin A1c 8.0  Telemetry: 08/02/13 Atrial fibrillation, heart rate low 100s, 08/03/13-currently sinus rhythm heart rate 89.  08/04/13-return of atrial fibrillation with rates in the 80s. Personally viewed.   EKG:  Atrial fibrillation left bundle-branch block, heart rate 80  Cardiac Studies:  CATH 08/01/13 - occluded SVG to diag and PL. Native flow however. Medical management. Left ventricular end-diastolic pressure 32 mm mercury.  Echocardiogram-15% EF per sonographer, no thrombus final read pending. Previous from 2014 showed 40% EF  . atorvastatin  20 mg Oral q1800  . busPIRone  10 mg Oral BID  . carvedilol  12.5 mg Oral BID WC  . furosemide  40 mg Oral BID  . glipiZIDE  2.5 mg Oral QAC breakfast  . insulin aspart  0-15 Units Subcutaneous TID WC  . insulin aspart  0-5 Units Subcutaneous QHS  . irbesartan  300 mg Oral Daily  .  living well with diabetes book   Does not apply Once  . omega-3 acid ethyl esters  1 g Oral Daily  . potassium chloride  20 mEq Oral Daily  . rivaroxaban  20 mg Oral Q supper    Assessment/Plan:   63 year old male with coronary artery disease status post bypass with acute on chronic systolic heart failure, demand ischemia, catheterization revealing occluded diagonal and posterior lateral graft with continued medical management.  1. Acute on chronic systolic heart failure-we will transition him to by mouth Lasix, 40 mg once a day at home. Total out a proximally 5.8 L . Expressed the importance of salt restriction, fluid restriction, daily weights, take extra Lasix if weight gain 3 pounds, call.  Continue with angiotensin receptor blocker irbesartan at goal 300 mg. I will increase his carvedilol to 12.5 mg twice a day. Suggest reevaluation of echocardiogram in approximately 90 days. If ejection fraction remains low, may need ICD.  Also consider addition of spironolactone, low-dose as outpatient. If spironolactone is used, discontinue potassium supplementation.  2. Coronary artery disease-cardiac catheterization reviewed, occluded PL/diagonal graft. Medical management. No intervention took place.  3. Ischemic cardiomyopathy-prior ejection fraction 40%. Repeat echocardiogram demonstrates worsened EF 20%, no thrombus.   4. Diabetes-monitoring. Insulin sliding scale, hemoglobin A1c 8.0. He is not on any medications at home. Diabetes per hospitalist team, diabetic management. Thank you.  5. Hyperlipidemia-continue with atorvastatin-LDL 42  6. Atrial flutter/fibrillation-please see EKG from 08/01/13 at 0707. With  his systolic dysfunction, heart failure and, hypertension age 63 (CHADS-VASC of 3) he deserves anticoagulation.  Xarelto 20mg . Reviewed ECG's - atrial flutter present. Has had prior flutter ablation on 08/06/12. At that time, he had resting sinus bradycardia in the 40s. This morning had  atrial fibrillation with heart rate of 80.  7. Demand ischemia/troponin mildly elevated in the setting of acute systolic heart failure.  8. Diabetes-hemoglobin A1c- 8. Appreciate hospitalist assistant. Diabetes coordinator assistance. Medication started. Continue to follow closely with primary physician.  Had lengthy discussion with he and his wife. Discussed diet, salt restrictions, hospital course.  Discharge today.  Dry weight 224 pounds.  Followup in clinic in 7 days, APP. Dr. Graciela HusbandsKlein after that. If Dr. Graciela HusbandsKlein requests, I would be happy to continue to follow him from a general cardiology perspective.   Larry Rangel, Larry Rangel 08/04/2013, 11:49 AM

## 2013-08-04 NOTE — Discharge Summary (Signed)
Discharge Summary   Patient ID: Larry Rangel,  MRN: [id number], DOB/AGE: Feb 03, 1950 63 y.o.  Admit date: 07/30/2013 Discharge date: 08/04/2013  Primary Care Provider: Danise Edge Primary Cardiologist: Dr. Graciela Husbands  Discharge Diagnoses Principal Problem:   Acute on chronic combined systolic and diastolic CHF (congestive heart failure) Active Problems:   HYPERLIPIDEMIA-MIXED   HYPERTENSION, UNSPECIFIED   CAD, ARTERY BYPASS GRAFT   Type II or unspecified type diabetes mellitus without mention of complication, uncontrolled   Atrial flutter   Atrial fibrillation   Demand ischemia   Allergies No Known Allergies  Procedures  Cardiac Catheterization Operative Report  Procedure Performed:  1. Left Heart Catheterization 2. Selective Coronary Angiography 3. Right Heart Catheterization 4. SVG angiography 5. LIMA graft angiography  Hemodynamic Findings:  Ao: 107/69  LV: 101/28/35  RA: 17  RV: 53/11/20  PA: 45/21/ (mean 34)  PCWP: 32  Fick Cardiac Output: 3.46 L/min  Fick Cardiac Index: 1.51 L/min/m2  Central Aortic Saturation: 93%  Pulmonary Artery Saturation: 49%  Angiographic Findings:  Left main: No obstructive disease.  Left Anterior Descending Artery: Large caliber vessel that courses to the apex. 50% proximal stenosis. 50% mid stenosis with competitive filling seen distally from graft. Mid and distal vessel fills from the patent IMA graft. The diagonal branch is patent, moderate in caliber with proximal 60% stenosis. The graft to the diagonal is occluded.  Circumflex Artery: Large caliber vessel with 50% mid stenosis. There is a large caliber obtuse marginal branch with 100% occlusion, fills from patent vein graft. The left posterolateral branch has a focal 60-70% stenosis. The vein graft that supplied the PL branch (sequential limb) is occluded.  Right Coronary Artery: Large caliber dominant vessel with 99% mid stenosis. The mid and distal vessel fills from the  patent free RIMA graft.  Graft Anatomy:  SVG to OM1/sequential to left posterolateral branch is patent to the OM but the sequential limb to the PL is occluded  SVG to Diagonal is occluded  LIMA to LAD is patent  Free RIMA to RCA is patent  Left Ventricular Angiogram: Deferred.  Impression:  1. Severe triple vessel CAD s/p 5V CABG with 3/5 patent bypass grafts. The graft to the left sided PL is occluded and the graft to the diagonal is occluded (both appear to be chronic occlusions)  2. Moderate disease in diagonal and left sided posterolateral branch, does not appear to be flow limiting.  3. Elevated filling pressures  Recommendations: Continue medical management of CAD. He appears to be volume overloaded. This is most likely contributing to his dyspnea and hypoxia. Continue diuresis with IV Lasix.  Complications: None; patient tolerated the procedure well.     Transthoracic Echocardiography - Left ventricle: The cavity size was moderately dilated. Systolic function was normal. The estimated ejection fraction was 20%. Diffuse hypokinesis. There is akinesis of the entireanteroseptal myocardium. There is akinesis of the entireapical myocardium. There was a reduced contribution of atrial contraction to ventricular filling, due to increased ventricular diastolic pressure or atrial contractile dysfunction. Doppler parameters are consistent with a reversible restrictive pattern, indicative of decreased left ventricular diastolic compliance and/or increased left atrial pressure (grade 3 diastolic dysfunction). - Mitral valve: Calcified annulus. There was trivial regurgitation. - Left atrium: The atrium was mildly dilated. - Tricuspid valve: There was trivial regurgitation.  Hospital Course  The patient is a 63 year old male with past medical history of coronary artery disease status post CABG in 2009, dyslipidemia, hypertension, ischemic cardiomyopathy with baseline EF 30-35% by echo  in  June 2014 and history of atrial flutter status post flutter ablation. Patient was seen by Dr. Mayford Knifeurner on 07/30/2013, during which visit, he complained of being short of breath for the past several days. He also described frequent paroxysmal nocturnal dyspnea with diaphoresis at night. According to the patient, he has not been compliant with his low sodium and low fat diet. He was noted to have elevated troponin of 0.085, however CPK and MB were normal . D-dimer was also elevated at 0.8. BNP was 1567. He was also noted to be in atrial flutter with RVR. [facility].   [facility], he was noted to have a troponin of 1.37 which trended up to 2.01 before coming down. He was started on heparin drip for a-flutter along with IV diltiazem. CT of the chest was obtained which showed no sign of PE however does reveal bilateral pulmonary infiltrate consistent with acute on chronic systolic heart failure. Patient was diuresed with 40mg  BID IV Lasix with improvement of chest pain. He converted back to normal sinus rhythm shortly after admission, however did not stay in NSR long before converting back to a-flutter. Given the elevation of troponin overnight, plan was made to undergo cardiac catheterization on 08/01/2013 with both left and right heart cath. Right heart cath revealed pulmonary wedge pressure 32, PA pressure 45/21 with a mean of 34, cardiac index 1.5, cardiac output 3.46. Left heart cath revealed severe triple-vessel disease status post 5 vessel bypass with 3/5 bypass graft patent including LIMA to LAD, free RIMA to RCA and SVG to OM1/sequential to left posterolateral branch patent. Her sequential SVG to PLV is occluded and SVG to diagonal is also occluded. There is some moderate disease in diagonal and posterior lateral branch which does not appear to be flow limiting. Given his elevated RV pressure and wedge pressure, he required further  diuresis. The elevated troponin appears to be due to demand ischemia. Post cardiac catheterization, his IV diltiazem was stopped given decreased ejection fraction, his carvedilol was increased to 6.25 twice a day. He was noted to have a hemoglobin A1c of 8.0 while not on any medication at home, hospitalist was consulted for diabetes management. Glucotrol and sliding scale insulin was started. Patient was recommended for outpatient ophthalmology followup and UA with his PCP. Given his CHA2DS2-VASC score of 3, Xarelto was started for his atrial fib flutter. Echocardiogram was obtained on 08/01/2013 which showed EF 20%, no thrombus, diffuse hypokinesis, with akinesis of the entire anteroseptal and apical myocardium, grade 3 diastolic dysfunction. His carvedilol and ARB were further titrated up to allow maximal benefit for heart failure.  He was seen the morning of 08/04/2013, he had a total output of approximately 5.8 L since admission. He is deemed stable for discharge from cardiology perspective. Further emphasis has been placed on the importance of salt restriction, fluid restriction and daily weight. He has been advised to take extra dose of Lasix if his weight increased by more than 3 pounds overnight. He current dry weight is 224 pounds. He has been advised to followup with diabetes with his primary care physician. Metformin 500mg  BID was added by hospitalist group on top of glypizide on the day of discharge. I have scheduled close followup with cardiology office in 1-2 week with APP and Dr. Odessa FlemingKlein's office after that. Unfortunately, there was no appt available within 7 days. A triage nurse will contact patient within 24-48hrs after discharge to  make sure he is doing ok. We will also obtain BMET on follow up with addition of lasix.   Discharge Vitals Blood pressure 90/53, pulse 82, temperature 97.5 F (36.4 C), temperature source Oral, resp. rate 20, height 6' 2.02" (1.88 m), weight 224 lb 10.4 oz (101.9 kg),  SpO2 96.00%.  Filed Weights   08/02/13 0500 08/03/13 0300 08/04/13 0416  Weight: 226 lb 12.8 oz (102.876 kg) 225 lb (102.059 kg) 224 lb 10.4 oz (101.9 kg)    Labs  CBC  Recent Labs  08/02/13 0037  WBC 8.8  HGB 14.8  HCT 44.8  MCV 90.1  PLT 169   Basic Metabolic Panel  Recent Labs  08/04/13 0635  NA 141  K 4.1  CL 102  CO2 26  GLUCOSE 136*  BUN 18  CREATININE 1.05  CALCIUM 9.3    Disposition  Pt is being discharged home today in good condition.  Follow-up Plans & Appointments      Follow-up Information   Follow up with Ronie Spies, PA-C On 08/15/2013. (11:30am. Patient will obtain BMET lab on the same day in the same office, order placed.)    Specialty:  Cardiology   Contact information:   [address] [place] [address] [phone]       Please follow up. (follow up with your primary care physician for eye exam and UA with diabetes management)       Discharge Medications    Medication List    STOP taking these medications       amLODipine-valsartan 5-320 MG per tablet  Commonly known as:  EXFORGE     POTASSIUM PO      TAKE these medications       aspirin EC 81 MG tablet  Take 1 tablet (81 mg total) by mouth daily.     atorvastatin 20 MG tablet  Commonly known as:  LIPITOR  Take 1 tablet (20 mg total) by mouth daily.     busPIRone 10 MG tablet  Commonly known as:  BUSPAR  Take 1 tablet (10 mg total) by mouth 2 (two) times daily.     carvedilol 12.5 MG tablet  Commonly known as:  COREG  Take 1 tablet (12.5 mg total) by mouth 2 (two) times daily with a meal.     clonazePAM 0.5 MG tablet  Commonly known as:  KLONOPIN  TAKE 1 TABLET BY MOUTH 3 TIMES A DAY AS NEEDED FOR ANXIETY     cyclobenzaprine 10 MG tablet  Commonly known as:  FLEXERIL  TAKE 1 TABLET BY MOUTH 3 TIMES A DAY AS NEEDED     FISH OIL PO  Take 1 capsule by mouth daily.     furosemide 40 MG tablet  Commonly known as:  LASIX  Take 1 tablet (40 mg  total) by mouth daily.  Start taking on:  08/05/2013     glipiZIDE 2.5 mg Tabs tablet  Commonly known as:  GLUCOTROL  Take 0.5 tablets (2.5 mg total) by mouth daily before breakfast.     irbesartan 300 MG tablet  Commonly known as:  AVAPRO  Take 1 tablet (300 mg total) by mouth daily.     metFORMIN 500 MG tablet  Commonly known as:  GLUCOPHAGE  Take 1 tablet (500 mg total) by mouth 2 (two) times daily with a meal.     potassium chloride SA 20 MEQ tablet  Commonly known as:  K-DUR,KLOR-CON  Take 1 tablet (20 mEq total) by mouth daily.  rivaroxaban 20 MG Tabs tablet  Commonly known as:  XARELTO  Take 1 tablet (20 mg total) by mouth daily with supper.        Outstanding Labs/Studies  BMET on the day of follow up   Duration of Discharge Encounter   Greater than 30 minutes including physician time.  Signed, Azalee Course PA-C 08/04/2013, 2:20 PM

## 2013-08-04 NOTE — Consult Note (Signed)
[facility] Medical Consultation  Larry Rangel L Rangel [id number] DOB: 1950-10-02 DOA: 07/30/2013 PCP: Danise EdgeBLYTH, STACEY, MD   Requesting physician: Dr. Donato SchultzMark Skains (cardiology) Date of consultation: 7/21 Reason for consultation: Uncontrolled diabetes  Impression/Recommendations Principal Problem:   Acute on chronic systolic CHF (congestive heart failure) Active Problems:   HYPERLIPIDEMIA-MIXED   HYPERTENSION, UNSPECIFIED   CAD, ARTERY BYPASS GRAFT   Type II or unspecified type diabetes mellitus without mention of complication, uncontrolled   Atrial fibrillation   Demand ischemia   Newly diagnosed DM2  -Recommend discharge on glipizide 2.5 mg daily -Recommend starting metformin 500 mg BID  - outpatient Opthalmology follow up and UA with PCP suggested for routine DM care - CBG reasonably controlled at this time   Dyslipidemia  -Recommend continued Lipitor 20 mg daily    HTN  -Recommend continue Coreg 12.5 mg daily -Recommend continue irbesartan 300 mg daily    Patient has been cleared by cardiology for discharge will signoff .   Chief Complaint: New onset diabetes  HPI:  63 y.o. WM PMHx CAD, Chronic Systolic CHF, Afib, HTN, was admitted by cardiology for CHF, was noted to have an A1c of 8. [facility] was called to assist with DM2 management   Review of Systems:  In addition to the HPI above,  No Fever-chills,  No Headache, No changes with Vision or hearing,  No problems swallowing food or Liquids,  No Chest pain, Cough or Shortness of Breath,  No Abdominal pain, No Nausea or Vommitting, Bowel movements are regular,  No Blood in stool or Urine,  No dysuria,  No new skin rashes or bruises,  No new joints pains-aches,  No new weakness, tingling, numbness in any extremity,  No recent weight gain or loss,  No polyuria, polydypsia or polyphagia,  No significant Mental Stressors.  A full 10 point Review of Systems was done, except as stated above, all other Review of  Systems were negative.   Past Medical History  Diagnosis Date  . Anxiety   . Hypertension   . Low back pain   . Tobacco user   . CAD (coronary artery disease) of artery bypass graft   . Hyperlipidemia   . S/P colonoscopy   . Cardiomyopathy     Echo 6/14: Mild LVH, EF 30-35%, diffuse HK, MAC, mild BAE  . Shortness of breath   . Myocardial infarction   . Arthritis   . Atrial flutter 07-2012    s/p ablation by Dr Graciela HusbandsKlein 08-06-2012  . Chicken pox as a child  . Measles as a child  . Mumps as a child  . Abnormal liver function 08/23/2010  . Medicare welcome exam 06/12/2013   Past Surgical History  Procedure Laterality Date  . Shoulder surgery    . Coronary artery bypass graft  2009    x 5  . Tee without cardioversion N/A 07/15/2012    Procedure: TRANSESOPHAGEAL ECHOCARDIOGRAM (TEE);  Surgeon: Vesta MixerPhilip J Nahser, MD;  [facility];  Service: Cardiovascular;  Laterality: N/A;  . Cardioversion N/A 07/15/2012    Procedure: CARDIOVERSION;  Surgeon: Vesta MixerPhilip J Nahser, MD;  [facility];  Service: Cardiovascular;  Laterality: N/A;  . Ablation of dysrhythmic focus  08/06/2012    CTI ablation by Dr Graciela HusbandsKlein  . Tonsillectomy    . Cardiac catheterization     Social History:  reports that he quit smoking about 2 years ago. His smoking use included Cigarettes. He has a 40 pack-year smoking history. He has never used smokeless tobacco.  He reports that he drinks about 14.4 ounces of alcohol per week. He reports that he does not use illicit drugs.  No Known Allergies Family History  Problem Relation Age of Onset  . Alzheimer's disease Mother   . Heart failure Father 84  . Hypertension Father   . Hyperlipidemia Father   . Cancer Father     lung- took half of a young- smoker  . Heart disease Father     MI at 67  . Early death Neg Hx   . Kidney disease Neg Hx   . Stroke Neg Hx   . Leukemia Brother   . Drug abuse Daughter     heroine    Prior to Admission medications    Medication Sig Start Date End Date Taking? Authorizing Provider  amLODipine-valsartan (EXFORGE) 5-320 MG per tablet  01/23/13  Yes Historical Provider, MD  aspirin EC 81 MG tablet Take 1 tablet (81 mg total) by mouth daily. 08/07/12  Yes Clide Deutscher Serpe, PA-C  atorvastatin (LIPITOR) 20 MG tablet Take 1 tablet (20 mg total) by mouth daily. 06/23/13  Yes Bradd Canary, MD  busPIRone (BUSPAR) 10 MG tablet Take 1 tablet (10 mg total) by mouth 2 (two) times daily. 04/08/13  Yes Bradd Canary, MD  clonazePAM (KLONOPIN) 0.5 MG tablet TAKE 1 TABLET BY MOUTH 3 TIMES A DAY AS NEEDED FOR ANXIETY   Yes Bradd Canary, MD  cyclobenzaprine (FLEXERIL) 10 MG tablet TAKE 1 TABLET BY MOUTH 3 TIMES A DAY AS NEEDED 11/13/12  Yes Etta Grandchild, MD  Omega-3 Fatty Acids (FISH OIL PO) Take 1 capsule by mouth daily.    Yes Historical Provider, MD  POTASSIUM PO Take 1 tablet by mouth daily. otc - pt does not know strength   Yes Historical Provider, MD  amLODipine-valsartan (EXFORGE) 5-320 MG per tablet TAKE 1 TABLET BY MOUTH EVERY DAY    Bradd Canary, MD   Physical Exam: Blood pressure 90/53, pulse 82, temperature 97.5 F (36.4 C), temperature source Oral, resp. rate 20, height 6' 2.02" (1.88 m), weight 101.9 kg (224 lb 10.4 oz), SpO2 96.00%. Filed Vitals:   08/03/13 2346 08/04/13 0416 08/04/13 0753 08/04/13 1158  BP: 99/73 125/91 128/96 90/53  Pulse:   95 82  Temp: 97.4 F (36.3 C) 97.4 F (36.3 C) 97.7 F (36.5 C) 97.5 F (36.4 C)  TempSrc: Oral Oral Oral Oral  Resp:   20 20  Height:      Weight:  101.9 kg (224 lb 10.4 oz)    SpO2: 98% 98% 98% 96%   General: No acute respiratory distress  Lungs: Clear to auscultation bilaterally without wheezes or crackles  Cardiovascular: Regular rate and rhythm without murmur gallop or rub normal S1 and S2  Abdomen: Nontender, nondistended, soft, bowel sounds positive, no rebound, no ascites, no appreciable mass  Extremities: No significant cyanosis, clubbing, or edema  bilateral lower extremities     Labs on Admission:  Basic Metabolic Panel:  Recent Labs Lab 07/31/13 0420 08/04/13 0635  NA 140 141  K 4.1 4.1  CL 101 102  CO2 22 26  GLUCOSE 166* 136*  BUN 18 18  CREATININE 1.03 1.05  CALCIUM 8.6 9.3   Liver Function Tests:  Recent Labs Lab 07/31/13 0420  AST 48*  ALT 60*  ALKPHOS 68  BILITOT 1.1  PROT 6.2  ALBUMIN 3.1*   No results found for this basename: LIPASE, AMYLASE,  in the last 168 hours No results  found for this basename: AMMONIA,  in the last 168 hours CBC:  Recent Labs Lab 07/31/13 0420 08/01/13 0230 08/02/13 0037  WBC 11.8* 9.9 8.8  HGB 15.0 14.7 14.8  HCT 45.2 44.8 44.8  MCV 88.6 89.8 90.1  PLT 184 176 169   Cardiac Enzymes:  Recent Labs Lab 07/30/13 1700 07/30/13 2346 07/31/13 0420  TROPONINI 1.37* 2.01* 1.61*   BNP: No components found with this basename: POCBNP,  CBG:  Recent Labs Lab 08/03/13 1130 08/03/13 1616 08/03/13 2132 08/04/13 0755 08/04/13 1155  GLUCAP 113* 135* 132* 140* 82    Radiological Exams on Admission: Dg Chest 2 View  08/03/2013   CLINICAL DATA:  CHF.  EXAM: CHEST  2 VIEW  COMPARISON:  Chest CT 07/30/2013 and chest radiograph 02/15/2007  FINDINGS: Two views of the chest demonstrate bilateral small pleural effusions, right side greater the left. There continues to be patchy airspace densities in both lungs. There are Kerley B-lines along the lateral left chest. Patient has median sternotomy wires. Heart size is normal. Negative for a pneumothorax.  IMPRESSION: Patchy airspace densities with evidence for interstitial edema. Differential diagnosis would include pneumonia and/or edema.  Bilateral pleural effusions, right side greater the left.   Electronically Signed   By: Richarda Overlie M.D.   On: 08/03/2013 07:58    EKG:   Time spent: 30 min  WOODS, Roselind Messier [facility] Pager [phone]  If 7PM-7AM, please contact night-coverage [email or web address] Password  [email or web address] 08/04/2013, 1:40 PM

## 2013-08-04 NOTE — Discharge Summary (Signed)
Personally seen and examined. Agree with above. Hospitalized with worsened acute on chronic systolic heart failure. Ejection fraction 20%. Did very well with diuresis. Continue with home Lasix. Extensive counseling given. Atrial fibrillation/flutter seen during hospitalization. Anticoagulation begun with Xarelto. Continue further titration of medications as outpatient. Consider Aldactone. Repeat echocardiogram in 90 days. May require ICD discussion in future.  Donato Schultz, MD

## 2013-08-05 ENCOUNTER — Telehealth: Payer: Self-pay | Admitting: Internal Medicine

## 2013-08-05 NOTE — Telephone Encounter (Signed)
Pt called back with questions about his K+. He was told to stop taking OTC K+ and take Rx of KCL.  He had been taking OTC potassium because a friend had told him it was good for him.  He will stop to OTC K+ and take Rx.

## 2013-08-05 NOTE — Telephone Encounter (Signed)
Close encounter 

## 2013-08-05 NOTE — Telephone Encounter (Signed)
Follow up     Pt's wife spoke with Synetta Fail this morning.  She needs to talk to you again

## 2013-08-05 NOTE — Telephone Encounter (Signed)
TCM pt.  Spoke w/wife who states he is SOB when he does any activity.  Has not gotten his medications yet.  Is planning on going this morning to get them.  Instructed that he needs to take his Lasix this AM.  She also states that she was instructed to weigh him every day and to call if wt over 3 lbs overnight.  They will come early for appointment to get lab on 8/3 prior to seeing D. Dunn,PA.  Advised once she has medications if she has any questions will call.

## 2013-08-06 ENCOUNTER — Telehealth: Payer: Self-pay | Admitting: Cardiology

## 2013-08-06 NOTE — Telephone Encounter (Signed)
  Patient's wife called stating that he is slightly SOB. He was recent discharged from the hospital 2 days ago after being treated for a/c CHF. His discharge weight was 224 lb. His weight today is 228 lb. I reviewed his discharge summary. He was discharged on 40 mg of PO Lasix daily as well as 20 mEq of KCl.  Instructions were given to increase Lasix by 1 tablet if weight increased > 3 lb overnight. I instructed him to take an additional lasix tablet as well as an extra dose of KCl. I recommended he refrain from sodium. They are to call our office on Monday for an appointment with an extender if his condition fails to improve and if he continues to gain weight despite increase in lasix.   Robbie Lis, PA-C

## 2013-08-09 ENCOUNTER — Telehealth: Payer: Self-pay | Admitting: Internal Medicine

## 2013-08-09 NOTE — Telephone Encounter (Signed)
New message     Patient wife calling wants to know can he get in the pool .    Questions regarding  medication

## 2013-08-09 NOTE — Telephone Encounter (Signed)
Advised 7-10d post cath to bath in bathtub/swim in pool. Advised to inspect site first, make sure completely healed before submersion. She verbalized understanding. Also advised to take 1 extra Lasix for 3 days. Pt c/o of increased SOB still - advised to continue taking increased dose 2 more days, for a total of 5 extra days of Lasix. They are also taking extra Potassium during this. Will return to normal dosing after 5 days complete. Coming in Monday for OV w/ PA and lab work.  Pt's wife verbalized understanding and agreeable to plan.

## 2013-08-15 ENCOUNTER — Other Ambulatory Visit: Payer: Commercial Managed Care - HMO

## 2013-08-15 ENCOUNTER — Encounter: Payer: Self-pay | Admitting: Physician Assistant

## 2013-08-15 ENCOUNTER — Ambulatory Visit (INDEPENDENT_AMBULATORY_CARE_PROVIDER_SITE_OTHER): Payer: Commercial Managed Care - HMO | Admitting: Physician Assistant

## 2013-08-15 VITALS — BP 130/90 | HR 81 | Ht 74.0 in | Wt 215.6 lb

## 2013-08-15 DIAGNOSIS — I2510 Atherosclerotic heart disease of native coronary artery without angina pectoris: Secondary | ICD-10-CM

## 2013-08-15 DIAGNOSIS — I447 Left bundle-branch block, unspecified: Secondary | ICD-10-CM | POA: Insufficient documentation

## 2013-08-15 DIAGNOSIS — I4892 Unspecified atrial flutter: Secondary | ICD-10-CM

## 2013-08-15 DIAGNOSIS — I480 Paroxysmal atrial fibrillation: Secondary | ICD-10-CM

## 2013-08-15 DIAGNOSIS — I4891 Unspecified atrial fibrillation: Secondary | ICD-10-CM

## 2013-08-15 DIAGNOSIS — I509 Heart failure, unspecified: Secondary | ICD-10-CM

## 2013-08-15 DIAGNOSIS — I5043 Acute on chronic combined systolic (congestive) and diastolic (congestive) heart failure: Secondary | ICD-10-CM

## 2013-08-15 MED ORDER — SPIRONOLACTONE 25 MG PO TABS: 25.0000 mg | ORAL_TABLET | Freq: Every day | ORAL | Status: DC

## 2013-08-15 NOTE — Patient Instructions (Signed)
Lab work today [employer] first [employer] work [facility] in 10 days  08/25/13 [facility] you may eat  Start Spironolactone 25 mg daily  Stop Aspirin  Follow up with Dr.Klein in 9/15   Refer to [place] in anticoag clinic follow up Xarelto

## 2013-08-15 NOTE — Telephone Encounter (Signed)
This encounter was created in error - please disregard.

## 2013-08-15 NOTE — Progress Notes (Addendum)
[address] [place], [place] [address] Phone: [phone]  Date:  08/15/2013   Patient ID:  Larry, Rangel 18-Aug-1950, MRN [id number]   PCP:  Danise Edge, MD  Cardiologist:  Dr. Graciela Husbands   History of Present Illness: Larry Rangel is a 63 y.o. male with history of CAD s/p CABG 2009, dyslipidemia, HTN, atrial flutter s/p ablation 2014, ICM with previous EF 30-35% in 06/2012 who was recently admitted with worsening a/c combined CHF with EF 20% and grade 3 diastolic dysfunction. He was also found to have recurrence of atrial fib/flutter. His troponins were elevated in the 1-2 range thus he underwent cath showing: 1. Severe triple vessel CAD s/p 5V CABG with 3/5 patent bypass grafts. The graft to the left sided PL is occluded and the graft to the diagonal is occluded (both appear to be chronic occlusions)  2. Moderate disease in diagonal and left sided posterolateral branch, does not appear to be flow limiting.  3. Elevated filling pressures His troponin elevation was felt due to demand ischemia. He was treated with IV diuresis.  He was placed on Xarelto. It appears his combo valsartan/amlodipine tablet was changed to irbesartan and Coreg was started. Discharge weight 224lbs on Lasix 40mg  and Kcl . Phone notes indicate weight had increased along with some dyspnea, and so he was instructed to take an additional Lasix/KCl on 7/25, as well as for 5 days starting 7/28. He is back to taking this once daily. He follows his meds using a spreadsheet. He is now following a healthier low-salt, heart-healthy diet. Weight is down to 215lb. He feels significantly better than when in the hospital. Denies further dyspnea, orthopnea, chest pain, bleeding, or palpitations. Is currently in NSR in the office today.   Recent Labs: 06/07/2013: HDL Cholesterol by NMR 36*; LDL (calc) 42  07/31/2013: ALT 60*; TSH 1.190  08/02/2013: Hemoglobin 14.8  08/04/2013: Creatinine 1.05; Potassium  4.1   Wt Readings from Last 3 Encounters:  08/15/13 215 lb 9.6 oz (97.796 kg)  08/04/13 224 lb 10.4 oz (101.9 kg)  08/04/13 224 lb 10.4 oz (101.9 kg)     Past Medical History  Diagnosis Date  . Anxiety   . Hypertension   . Low back pain   . Tobacco user   . CAD (coronary artery disease)     a. s/p CABG 2009. b. Cath 07/2013: 3/5 patent grafts (appear to have chronic occ grafts), mod diag/L PL branch, did not appear flow limiting, elevated filling pressures.  . Hyperlipidemia   . S/P colonoscopy   . Myocardial infarction   . Arthritis   . Atrial flutter 07-2012    a. s/p ablation by Dr Graciela Husbands 08-06-2012. b. Recurrence 07/2013.   Marland Kitchen Chicken pox as a child  . Measles as a child  . Mumps as a child  . Abnormal liver function 08/23/2010  . Medicare welcome exam   . Chronic combined systolic and diastolic CHF (congestive heart failure)     a. Echo 6/14: Mild LVH, EF 30-35%, diffuse HK, MAC, mild BAE. b. Drop in EF to 20% by echo 07/2013.  . Diabetes mellitus   . Atrial fibrillation     Current Outpatient Prescriptions  Medication Sig Dispense Refill  . aspirin EC 81 MG tablet Take 1 tablet (81 mg total) by mouth daily.      Marland Kitchen atorvastatin (LIPITOR) 20 MG tablet Take 1 tablet (20 mg total) by mouth daily.  90 tablet  1  .  busPIRone (BUSPAR) 10 MG tablet Take 1 tablet (10 mg total) by mouth 2 (two) times daily.  180 tablet  1  . carvedilol (COREG) 12.5 MG tablet Take 1 tablet (12.5 mg total) by mouth 2 (two) times daily with a meal.  60 tablet  12  . clonazePAM (KLONOPIN) 0.5 MG tablet TAKE 1 TABLET BY MOUTH 3 TIMES A DAY AS NEEDED FOR ANXIETY  90 tablet  2  . cyclobenzaprine (FLEXERIL) 10 MG tablet TAKE 1 TABLET BY MOUTH 3 TIMES A DAY AS NEEDED  90 tablet  3  . furosemide (LASIX) 40 MG tablet Take 1 tablet (40 mg total) by mouth daily.  30 tablet  3  . glipiZIDE (GLUCOTROL) 2.5 mg TABS tablet Take 0.5 tablets (2.5 mg total) by mouth daily before breakfast.  30 tablet  12  . irbesartan  (AVAPRO) 300 MG tablet Take 1 tablet (300 mg total) by mouth daily.  30 tablet  12  . metFORMIN (GLUCOPHAGE) 500 MG tablet Take 1 tablet (500 mg total) by mouth 2 (two) times daily with a meal.  60 tablet  6  . Omega-3 Fatty Acids (FISH OIL PO) Take 1 capsule by mouth daily.       . potassium chloride SA (K-DUR,KLOR-CON) 20 MEQ tablet Take 1 tablet (20 mEq total) by mouth daily.  30 tablet  3  . rivaroxaban (XARELTO) 20 MG TABS tablet Take 1 tablet (20 mg total) by mouth daily with supper.  30 tablet  12   No current facility-administered medications for this visit.    Allergies:   Review of patient's allergies indicates no known allergies.   Social History:  The patient  reports that he quit smoking about 2 years ago. His smoking use included Cigarettes. He has a 40 pack-year smoking history. He has never used smokeless tobacco. He reports that he drinks about 14.4 ounces of alcohol per week. He reports that he does not use illicit drugs.   Family History:  The patient's family history includes Alzheimer's disease in his mother; Cancer in his father; Drug abuse in his daughter; Heart disease in his father; Heart failure (age of onset: 4763) in his father; Hyperlipidemia in his father; Hypertension in his father; Leukemia in his brother. There is no history of Early death, Kidney disease, or Stroke.   ROS:  Please see the history of present illness.   All other systems reviewed and negative.   PHYSICAL EXAM: VS:  BP 130/90  Pulse 81  Ht 6\' 2"  (1.88 m)  Wt 215 lb 9.6 oz (97.796 kg)  BMI 27.67 kg/m2 Well nourished, well developed WM in no acute distress HEENT: normal Neck: no JVD, no carotid bruits Cardiac:  normal S1, S2; RRR; no murmur Lungs:  clear to auscultation bilaterally, no wheezing, rhonchi or rales Abd: soft, nontender, no hepatomegaly Ext: no edema, R groin cath site without hematoma, ecchymosis or bruit, 2+ pedal pulses bilaterally Skin: warm and dry Neuro:  moves all  extremities spontaneously, no focal abnormalities noted  EKG:  NSR 81bpm LBBB, possible LAE, no significant change from prior  ASSESSMENT AND PLAN:  1. Chronic combined systolic/diastolic CHF - EF recently decreased further to 20%  2. CAD s/p CABG 2009, cath 07/2013 with 3/5 grafts patent (2 chronically occluded) 3. Paroxysmal atrial fibrillation/flutter, s/p flutter ablation 2014, currently in NSR  4. LBBB 5. HTN  Doing well. Appears euvolemic. Weight is down further than in hospital but he has also begun eating healthier. Check BMET today.  Start spironolactone 25mg  daily and recheck BMET in 10 days. We may be able to d/c his KCl supplement based on today's BMET. The patient will monitor BP at home periodically. In the setting of persistent chronic LV dysfunction and LBBB, will refer back to EP to discuss timing of ICD. We are working to optimize medications but he has had low EF at least since 2014. Will also plug him into anticoag clinic for periodic monitoring while on Xarelto. I discussed aspirin with Dr. Royann Shivers in light of Xarelto usage and we have elected to discontinue aspirin this visit to reduce risk of bleeding.   Dispo: F/u Dr. Graciela Husbands with EP (who is also his primary cardiologist) next available in September  Signed, Taleisha Kaczynski, PA-C  08/15/2013 11:34 AM

## 2013-08-15 NOTE — Telephone Encounter (Signed)
Larry Rangel need a lab order for Larry Rangel. He is downstairs in the lab .Marland Kitchen Suppose to get labs before his appt this morning at 11:30

## 2013-08-16 ENCOUNTER — Other Ambulatory Visit: Payer: Self-pay | Admitting: *Deleted

## 2013-08-16 ENCOUNTER — Telehealth: Payer: Self-pay | Admitting: *Deleted

## 2013-08-16 DIAGNOSIS — I5043 Acute on chronic combined systolic (congestive) and diastolic (congestive) heart failure: Secondary | ICD-10-CM

## 2013-08-16 LAB — BASIC METABOLIC PANEL
BUN: 16 mg/dL (ref 6–23)
CALCIUM: 9.7 mg/dL (ref 8.4–10.5)
CO2: 26 mEq/L (ref 19–32)
CREATININE: 0.95 mg/dL (ref 0.50–1.35)
Chloride: 107 mEq/L (ref 96–112)
Glucose, Bld: 102 mg/dL — ABNORMAL HIGH (ref 70–99)
Potassium: 4.6 mEq/L (ref 3.5–5.3)
SODIUM: 144 meq/L (ref 135–145)

## 2013-08-16 NOTE — Telephone Encounter (Signed)
pt's wife notified about lab results. Wife aware to stop K+ supp since starting spironolactone yesterday, bmet 8/17 same day pt has CVRR appt at [facility] office. Wife verbalized understanding to Plan of Care.

## 2013-08-22 ENCOUNTER — Encounter: Payer: Self-pay | Admitting: Family Medicine

## 2013-08-24 ENCOUNTER — Telehealth: Payer: Self-pay

## 2013-08-24 NOTE — Telephone Encounter (Signed)
We received paperwork from [facility] stating that pts Cyclobenzaprine 10 mg, 90/30 was approved through 01-12-14

## 2013-08-25 ENCOUNTER — Other Ambulatory Visit: Payer: Self-pay

## 2013-08-25 MED ORDER — FUROSEMIDE 40 MG PO TABS: 40.0000 mg | ORAL_TABLET | Freq: Every day | ORAL | Status: DC

## 2013-08-29 ENCOUNTER — Ambulatory Visit (INDEPENDENT_AMBULATORY_CARE_PROVIDER_SITE_OTHER): Payer: Commercial Managed Care - HMO | Admitting: Pharmacist Clinician (PhC)/ Clinical Pharmacy Specialist

## 2013-08-29 DIAGNOSIS — I4892 Unspecified atrial flutter: Secondary | ICD-10-CM

## 2013-08-29 DIAGNOSIS — Z5181 Encounter for therapeutic drug level monitoring: Secondary | ICD-10-CM

## 2013-08-29 NOTE — Progress Notes (Signed)
Pt was started on Xarelto for atrial flutter at hospital, with discharge on 7/23.   Reviewed patients medication list.  Pt is not currently on any combined P-gp and strong CYP3A4 inhibitors/inducers (ketoconazole, traconazole, ritonavir, carbamazepine, phenytoin, rifampin, St. John's wort).  Reviewed labs.  SCr 0.95, Weight 215.6 lb, CrCl- 107.  Dose appropriate based on CrCl.   Hgb and HCT :14.8/44.8  A full discussion of the nature of anticoagulants has been carried out.  A benefit/risk analysis has been presented to the patient, so that they understand the justification for choosing anticoagulation with Xarelto at this time.  The need for compliance is stressed.  Pt is aware to take the medication once daily with the largest meal of the day.  Side effects of potential bleeding are discussed, including unusual colored urine or stools, coughing up blood or coffee ground emesis, nose bleeds or serious fall or head trauma.  Discussed signs and symptoms of stroke. The patient should avoid any OTC items containing aspirin or ibuprofen.  Avoid alcohol consumption.   Call if any signs of abnormal bleeding.  Discussed financial obligations and resolved any difficulty in obtaining medication.  Next lab test test in 6 months.    Pt has taken Xarelto on short term basis twice in the past.  Had one episode of excessive bleeding after cut ear while in kayak, but otherwise no problems in past.

## 2013-08-29 NOTE — Progress Notes (Unsigned)
PA request for cyclobenzaprine 10 mg 1 tab po tid prn received and completed, awaiting approval

## 2013-08-30 ENCOUNTER — Telehealth: Payer: Self-pay | Admitting: *Deleted

## 2013-08-30 LAB — BASIC METABOLIC PANEL
BUN: 13 mg/dL (ref 6–23)
CALCIUM: 9.9 mg/dL (ref 8.4–10.5)
CO2: 27 meq/L (ref 19–32)
CREATININE: 0.97 mg/dL (ref 0.50–1.35)
Chloride: 104 mEq/L (ref 96–112)
GLUCOSE: 114 mg/dL — AB (ref 70–99)
Potassium: 4.4 mEq/L (ref 3.5–5.3)
Sodium: 141 mEq/L (ref 135–145)

## 2013-08-30 LAB — LIPID PANEL
CHOLESTEROL: 137 mg/dL (ref 0–200)
HDL: 37 mg/dL — ABNORMAL LOW (ref >39)
LDL Cholesterol: 66 mg/dL (ref 0–99)
TRIGLYCERIDES: 169 mg/dL — AB (ref <150)
Total CHOL/HDL Ratio: 3.7 Ratio
VLDL: 34 mg/dL (ref 0–40)

## 2013-08-30 NOTE — Telephone Encounter (Signed)
pt notfied about lab results with verbal understanding today.

## 2013-09-05 ENCOUNTER — Ambulatory Visit (INDEPENDENT_AMBULATORY_CARE_PROVIDER_SITE_OTHER): Payer: Commercial Managed Care - HMO | Admitting: Family Medicine

## 2013-09-05 ENCOUNTER — Encounter: Payer: Self-pay | Admitting: Family Medicine

## 2013-09-05 VITALS — BP 112/74 | HR 89 | Temp 98.1°F | Ht 74.0 in | Wt 210.1 lb

## 2013-09-05 DIAGNOSIS — E785 Hyperlipidemia, unspecified: Secondary | ICD-10-CM

## 2013-09-05 DIAGNOSIS — I5043 Acute on chronic combined systolic (congestive) and diastolic (congestive) heart failure: Secondary | ICD-10-CM

## 2013-09-05 DIAGNOSIS — I10 Essential (primary) hypertension: Secondary | ICD-10-CM

## 2013-09-05 DIAGNOSIS — G4733 Obstructive sleep apnea (adult) (pediatric): Secondary | ICD-10-CM

## 2013-09-05 DIAGNOSIS — E1165 Type 2 diabetes mellitus with hyperglycemia: Secondary | ICD-10-CM

## 2013-09-05 DIAGNOSIS — I480 Paroxysmal atrial fibrillation: Secondary | ICD-10-CM

## 2013-09-05 DIAGNOSIS — IMO0001 Reserved for inherently not codable concepts without codable children: Secondary | ICD-10-CM

## 2013-09-05 DIAGNOSIS — I509 Heart failure, unspecified: Secondary | ICD-10-CM

## 2013-09-05 DIAGNOSIS — I4891 Unspecified atrial fibrillation: Secondary | ICD-10-CM

## 2013-09-05 NOTE — Progress Notes (Signed)
Pre visit review using our clinic review tool, if applicable. No additional management support is needed unless otherwise documented below in the visit note. 

## 2013-09-05 NOTE — Assessment & Plan Note (Signed)
Last A1C 8.o given rx for glucometer to check blood sugars daily and prn. Minimize simple carbs recheck A1C in 2 months

## 2013-09-05 NOTE — Assessment & Plan Note (Signed)
RRR today, asymptomatic 

## 2013-09-05 NOTE — Progress Notes (Signed)
Patient ID: Larry Rangel, male   DOB: 1950/11/14, 62 y.o.   MRN: [id number] RAISHAWN NAZAIRE [id number] 1950/05/17 09/05/2013      Progress Note-Follow Up  Subjective  Chief Complaint  Chief Complaint  Patient presents with  . Follow-up    3 month    HPI  Patient is a 63 year old male in today for routine medical care. Notes he was hospitalized last month for acute congestive heart failure atrial fibrillation. Has felt well since being home from the hospital. Has had no more palpitations, shortness of breath or chest pain. He is tolerated the adjustment medications without difficulty. Denies polyuria or polydipsia. Has cut down on his carbohydrates he eats very little bread and drinks only 2 glasses of wine. No other acute concerns or illness. Denies CP/palp/SOB/HA/congestion/fevers/GI or GU c/o. Taking meds as prescribed   Past Medical History  Diagnosis Date  . Anxiety   . Hypertension   . Low back pain   . Tobacco user   . CAD (coronary artery disease)     a. s/p CABG 2009. b. Cath 07/2013: 3/5 patent grafts (appear to have chronic occ grafts), mod diag/L PL branch, did not appear flow limiting, elevated filling pressures.  . Hyperlipidemia   . S/P colonoscopy   . Myocardial infarction   . Arthritis   . Paroxysmal atrial flutter 07-2012    a. s/p ablation by Dr Graciela Husbands 08-06-2012. b. Recurrence 07/2013.   Marland Kitchen Chicken pox as a child  . Measles as a child  . Mumps as a child  . Abnormal liver function 08/23/2010  . Chronic combined systolic and diastolic CHF (congestive heart failure)     a. Echo 6/14: Mild LVH, EF 30-35%, diffuse HK, MAC, mild BAE. b. Drop in EF to 20% by echo 07/2013.  . Diabetes mellitus   . PAF (paroxysmal atrial fibrillation)   . LBBB (left bundle branch block)     Past Surgical History  Procedure Laterality Date  . Shoulder surgery    . Coronary artery bypass graft  2009    x 5  . Tee without cardioversion N/A 07/15/2012    Procedure: TRANSESOPHAGEAL  ECHOCARDIOGRAM (TEE);  Surgeon: Vesta Mixer, MD;  Location: [facility] ENDOSCOPY;  Service: Cardiovascular;  Laterality: N/A;  . Cardioversion N/A 07/15/2012    Procedure: CARDIOVERSION;  Surgeon: Vesta Mixer, MD;  Location: [facility] ENDOSCOPY;  Service: Cardiovascular;  Laterality: N/A;  . Ablation of dysrhythmic focus  08/06/2012    CTI ablation by Dr Graciela Husbands  . Tonsillectomy    . Cardiac catheterization      Family History  Problem Relation Age of Onset  . Alzheimer's disease Mother   . Heart failure Father 48  . Hypertension Father   . Hyperlipidemia Father   . Cancer Father     lung- took half of a young- smoker  . Heart disease Father     MI at 35  . Early death Neg Hx   . Kidney disease Neg Hx   . Stroke Neg Hx   . Leukemia Brother   . Drug abuse Daughter     heroine    History   Social History  . Marital Status: Married    Spouse Name: N/A    Number of Children: N/A  . Years of Education: N/A   Occupational History  . Not on file.   Social History Main Topics  . Smoking status: Former Smoker -- 1.00 packs/day for 40 years  Types: Cigarettes    Quit date: 02/01/2011  . Smokeless tobacco: Never Used  . Alcohol Use: 14.4 oz/week    24 Glasses of wine per week     Comment: drinks 2 bottles of wine per week  . Drug Use: No  . Sexual Activity: Yes   Other Topics Concern  . 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   He has been married four times previously, the first  For 57yrs and has two children by the marriage which are grown, the second time for 11 yrs and has 2 by that marriage.   Wife has custody in [place].   The third marriage was 4 1/2 yrs and most recent marriage was the past eight weeks and he is currently estranged from that person.    Current Outpatient Prescriptions on File Prior to Visit  Medication Sig  Dispense Refill  . atorvastatin (LIPITOR) 20 MG tablet Take 1 tablet (20 mg total) by mouth daily.  90 tablet  1  . busPIRone (BUSPAR) 10 MG tablet Take 1 tablet (10 mg total) by mouth 2 (two) times daily.  180 tablet  1  . carvedilol (COREG) 12.5 MG tablet Take 1 tablet (12.5 mg total) by mouth 2 (two) times daily with a meal.  60 tablet  12  . clonazePAM (KLONOPIN) 0.5 MG tablet TAKE 1 TABLET BY MOUTH 3 TIMES A DAY AS NEEDED FOR ANXIETY  90 tablet  2  . cyclobenzaprine (FLEXERIL) 10 MG tablet TAKE 1 TABLET BY MOUTH 3 TIMES A DAY AS NEEDED  90 tablet  3  . furosemide (LASIX) 40 MG tablet Take 1 tablet (40 mg total) by mouth daily. if weight increased > 3 lb overnight increase Lasix by 1 tablet  45 tablet  3  . glipiZIDE (GLUCOTROL) 2.5 mg TABS tablet Take 0.5 tablets (2.5 mg total) by mouth daily before breakfast.  30 tablet  12  . irbesartan (AVAPRO) 300 MG tablet Take 1 tablet (300 mg total) by mouth daily.  30 tablet  12  . metFORMIN (GLUCOPHAGE) 500 MG tablet Take 1 tablet (500 mg total) by mouth 2 (two) times daily with a meal.  60 tablet  6  . rivaroxaban (XARELTO) 20 MG TABS tablet Take 1 tablet (20 mg total) by mouth daily with supper.  30 tablet  12  . spironolactone (ALDACTONE) 25 MG tablet Take 1 tablet (25 mg total) by mouth daily.  30 tablet  6   No current facility-administered medications on file prior to visit.    No Known Allergies  Review of Systems  Review of Systems  Constitutional: Negative for fever and malaise/fatigue.  HENT: Negative for congestion.   Eyes: Negative for discharge.  Respiratory: Negative for shortness of breath.   Cardiovascular: Negative for chest pain, palpitations and leg swelling.  Gastrointestinal: Negative for nausea, abdominal pain and diarrhea.  Genitourinary: Negative for dysuria.  Musculoskeletal: Negative for falls.  Skin: Negative for rash.  Neurological: Negative for loss of consciousness and headaches.  Endo/Heme/Allergies:  Negative for polydipsia.  Psychiatric/Behavioral: Negative for depression and suicidal ideas. The patient is not nervous/anxious and does not have insomnia.     Objective  BP 112/74  Pulse 89  Temp(Src) 98.1 F (36.7 C) (Oral)  Ht  (1.88 m)  Wt 210 lb 1.3 oz (95.292 kg)  BMI  26.96 kg/m2  SpO2 98%  Physical Exam  Physical Exam  Constitutional: He is oriented to person, place, and time and well-developed, well-nourished, and in no distress. No distress.  HENT:  Head: Normocephalic and atraumatic.  Eyes: Conjunctivae are normal.  Neck: Neck supple. No thyromegaly present.  Cardiovascular: Normal rate, regular rhythm and normal heart sounds.   No murmur heard. Pulmonary/Chest: Effort normal and breath sounds normal. No respiratory distress.  Abdominal: He exhibits no distension and no mass. There is no tenderness.  Musculoskeletal: He exhibits no edema.  Neurological: He is alert and oriented to person, place, and time.  Skin: Skin is warm.  Psychiatric: Memory, affect and judgment normal.    Lab Results  Component Value Date   TSH 1.190 07/31/2013   Lab Results  Component Value Date   WBC 8.8 08/02/2013   HGB 14.8 08/02/2013   HCT 44.8 08/02/2013   MCV 90.1 08/02/2013   PLT 169 08/02/2013   Lab Results  Component Value Date   CREATININE 0.97 08/29/2013   BUN 13 08/29/2013   NA 141 08/29/2013   K 4.4 08/29/2013   CL 104 08/29/2013   CO2 27 08/29/2013   Lab Results  Component Value Date   ALT 60* 07/31/2013   AST 48* 07/31/2013   ALKPHOS 68 07/31/2013   BILITOT 1.1 07/31/2013   Lab Results  Component Value Date   CHOL 137 08/29/2013   Lab Results  Component Value Date   HDL 37* 08/29/2013   Lab Results  Component Value Date   LDLCALC 66 08/29/2013   Lab Results  Component Value Date   TRIG 169* 08/29/2013   Lab Results  Component Value Date   CHOLHDL 3.7 08/29/2013     Assessment & Plan  HYPERTENSION, UNSPECIFIED Well controlled, no changes to meds.  Encouraged heart healthy diet such as the DASH diet and exercise as tolerated.   HYPERLIPIDEMIA-MIXED Tolerating statin, encouraged heart healthy diet, avoid trans fats, minimize simple carbs and saturated fats. Increase exercise as tolerated. Considering restarting Niacin er 500 mg qhs, take a lowfat snack prior  Acute on chronic combined systolic and diastolic CHF (congestive heart failure) Doing well with medication adjustments, asymptomatic at this time  Paroxysmal atrial fibrillation RRR today, asymptomatic  Type II or unspecified type diabetes mellitus without mention of complication, uncontrolled Last A1C 8.o given rx for glucometer to check blood sugars daily and prn. Minimize simple carbs recheck A1C in 2 months

## 2013-09-05 NOTE — Assessment & Plan Note (Signed)
Tolerating statin, encouraged heart healthy diet, avoid trans fats, minimize simple carbs and saturated fats. Increase exercise as tolerated. Considering restarting Niacin er 500 mg qhs, take a lowfat snack prior

## 2013-09-05 NOTE — Assessment & Plan Note (Signed)
Doing well with medication adjustments, asymptomatic at this time

## 2013-09-05 NOTE — Patient Instructions (Addendum)
Check blood sugars daily call if BS remain above 150  Basic Carbohydrate Counting for Diabetes Mellitus Carbohydrate counting is a method for keeping track of the amount of carbohydrates you eat. Eating carbohydrates naturally increases the level of sugar (glucose) in your blood, so it is important for you to know the amount that is okay for you to have in every meal. Carbohydrate counting helps keep the level of glucose in your blood within normal limits. The amount of carbohydrates allowed is different for every person. A dietitian can help you calculate the amount that is right for you. Once you know the amount of carbohydrates you can have, you can count the carbohydrates in the foods you want to eat. Carbohydrates are found in the following foods:  Grains, such as breads and cereals.  Dried beans and soy products.  Starchy vegetables, such as potatoes, peas, and corn.  Fruit and fruit juices.  Milk and yogurt.  Sweets and snack foods, such as cake, cookies, candy, chips, soft drinks, and fruit drinks. CARBOHYDRATE COUNTING There are two ways to count the carbohydrates in your food. You can use either of the methods or a combination of both. Reading the "Nutrition Facts" on Packaged Food The "Nutrition Facts" is an area that is included on the labels of almost all packaged food and beverages in the [place]. It includes the serving size of that food or beverage and information about the nutrients in each serving of the food, including the grams (g) of carbohydrate per serving.  Decide the number of servings of this food or beverage that you will be able to eat or drink. Multiply that number of servings by the number of grams of carbohydrate that is listed on the label for that serving. The total will be the amount of carbohydrates you will be having when you eat or drink this food or beverage. Learning Standard Serving Sizes of Food When you eat food that is not packaged or does not  include "Nutrition Facts" on the label, you need to measure the servings in order to count the amount of carbohydrates.A serving of most carbohydrate-rich foods contains about 15 g of carbohydrates. The following list includes serving sizes of carbohydrate-rich foods that provide 15 g ofcarbohydrate per serving:   1 slice of bread (1 oz) or 1 six-inch tortilla.    of a hamburger bun or English muffin.  4-6 crackers.   cup unsweetened dry cereal.    cup hot cereal.   cup rice or pasta.    cup mashed potatoes or  of a large baked potato.  1 cup fresh fruit or one small piece of fruit.    cup canned or frozen fruit or fruit juice.  1 cup milk.   cup plain fat-free yogurt or yogurt sweetened with artificial sweeteners.   cup cooked dried beans or starchy vegetable, such as peas, corn, or potatoes.  Decide the number of standard-size servings that you will eat. Multiply that number of servings by 15 (the grams of carbohydrates in that serving). For example, if you eat 2 cups of strawberries, you will have eaten 2 servings and 30 g of carbohydrates (2 servings x 15 g = 30 g). For foods such as soups and casseroles, in which more than one food is mixed in, you will need to count the carbohydrates in each food that is included. EXAMPLE OF CARBOHYDRATE COUNTING Sample Dinner  3 oz chicken breast.   cup of brown rice.   cup of  corn.  1 cup milk.   1 cup strawberries with sugar-free whipped topping.  Carbohydrate Calculation Step 1: Identify the foods that contain carbohydrates:   Rice.   Corn.   Milk.   Strawberries. Step 2:Calculate the number of servings eaten of each:   2 servings of rice.   1 serving of corn.   1 serving of milk.   1 serving of strawberries. Step 3: Multiply each of those number of servings by 15 g:   2 servings of rice x 15 g = 30 g.   1 serving of corn x 15 g = 15 g.   1 serving of milk x 15 g = 15 g.   1  serving of strawberries x 15 g = 15 g. Step 4: Add together all of the amounts to find the total grams of carbohydrates eaten: 30 g + 15 g + 15 g + 15 g = 75 g. Document Released: 12/30/2004 Document Revised: 05/16/2013 Document Reviewed: 11/26/2012 [facility] Patient Information 2015 [place], [place]. This information is not intended to replace advice given to you by your health care provider. Make sure you discuss any questions you have with your health care provider.

## 2013-09-05 NOTE — Assessment & Plan Note (Signed)
Well controlled, no changes to meds. Encouraged heart healthy diet such as the DASH diet and exercise as tolerated.  °

## 2013-09-15 ENCOUNTER — Ambulatory Visit (INDEPENDENT_AMBULATORY_CARE_PROVIDER_SITE_OTHER): Payer: Commercial Managed Care - HMO | Admitting: Internal Medicine

## 2013-09-15 VITALS — BP 138/79 | HR 64 | Ht 74.0 in | Wt 211.0 lb

## 2013-09-15 DIAGNOSIS — I2589 Other forms of chronic ischemic heart disease: Secondary | ICD-10-CM

## 2013-09-15 DIAGNOSIS — I255 Ischemic cardiomyopathy: Secondary | ICD-10-CM

## 2013-09-15 DIAGNOSIS — I4892 Unspecified atrial flutter: Secondary | ICD-10-CM

## 2013-09-15 NOTE — Progress Notes (Signed)
Patient Care Team: Mosie Lukes, MD as PCP - General (Family Medicine)   HPI  STORY Larry Rangel is a 63 y.o. male Seen in followup for CHF in the setting of ischemic cardiomyopathy with prior bypass surgery-2009.  During hospitalization 7/15 EF was noted to be 20%. He had recurrences of atrial fibrillation.he underwent cath showing:  1. Severe triple vessel CAD s/p 5V CABG with 3/5 patent bypass grafts. The graft to the left sided PL is occluded and the graft to the diagonal is occluded (both appear to be chronic occlusions)  2. Moderate disease in diagonal and left sided posterolateral branch, does not appear to be flow limiting.  3. Elevated filling pressures  ECG demonstrated left bundle branch block with a QRS duration of 150.  Since discharge he has lost about 15 pounds. This is on a no carb diet. He is able to climb stairs. He has had no edema or chest pain.  Last potassium and are records 8/17 at 4.4       Past Medical History  Diagnosis Date  . Anxiety   . Hypertension   . Low back pain   . Tobacco user   . CAD (coronary artery disease)     a. s/p CABG 2009. b. Cath 07/2013: 3/5 patent grafts (appear to have chronic occ grafts), mod diag/L PL branch, did not appear flow limiting, elevated filling pressures.  . Hyperlipidemia   . S/P colonoscopy   . Myocardial infarction   . Arthritis   . Paroxysmal atrial flutter 07-2012    a. s/p ablation by Dr [name] 08-06-2012. b. Recurrence 07/2013.   Marland Kitchen Chicken pox as a child  . Measles as a child  . Mumps as a child  . Abnormal liver function 08/23/2010  . Chronic combined systolic and diastolic CHF (congestive heart failure)     a. Echo 6/14: Mild LVH, EF 30-35%, diffuse HK, MAC, mild BAE. b. Drop in EF to 20% by echo 07/2013.  . Diabetes mellitus   . PAF (paroxysmal atrial fibrillation)   . LBBB (left bundle branch block)     Past Surgical History  Procedure Laterality Date  . Shoulder surgery    . Coronary  artery bypass graft  2009    x 5  . Tee without cardioversion N/A 07/15/2012    Procedure: TRANSESOPHAGEAL ECHOCARDIOGRAM (TEE);  Surgeon: Thayer Headings, MD;  Location: [place];  Service: Cardiovascular;  Laterality: N/A;  . Cardioversion N/A 07/15/2012    Procedure: CARDIOVERSION;  Surgeon: Thayer Headings, MD;  Location: [place];  Service: Cardiovascular;  Laterality: N/A;  . Ablation of dysrhythmic focus  08/06/2012    CTI ablation by Dr [name]  . Tonsillectomy    . Cardiac catheterization      Current Outpatient Prescriptions  Medication Sig Dispense Refill  . ACCU-CHEK FASTCLIX LANCETS MISC       . ACCU-CHEK SMARTVIEW test strip       . atorvastatin (LIPITOR) 20 MG tablet Take 1 tablet (20 mg total) by mouth daily.  90 tablet  1  . Blood Glucose Monitoring Suppl (ACCU-CHEK NANO SMARTVIEW) W/DEVICE KIT       . busPIRone (BUSPAR) 10 MG tablet Take 1 tablet (10 mg total) by mouth 2 (two) times daily.  180 tablet  1  . carvedilol (COREG) 12.5 MG tablet Take 1 tablet (12.5 mg total) by mouth 2 (two) times daily with a meal.  60 tablet  12  . clonazePAM (  KLONOPIN) 0.5 MG tablet TAKE 1 TABLET BY MOUTH 3 TIMES A DAY AS NEEDED FOR ANXIETY  90 tablet  2  . cyclobenzaprine (FLEXERIL) 10 MG tablet TAKE 1 TABLET BY MOUTH 3 TIMES A DAY AS NEEDED  90 tablet  3  . furosemide (LASIX) 40 MG tablet Take 1 tablet (40 mg total) by mouth daily. if weight increased > 3 lb overnight increase Lasix by 1 tablet  45 tablet  3  . glipiZIDE (GLUCOTROL) 2.5 mg TABS tablet Take 0.5 tablets (2.5 mg total) by mouth daily before breakfast.  30 tablet  12  . irbesartan (AVAPRO) 300 MG tablet Take 1 tablet (300 mg total) by mouth daily.  30 tablet  12  . metFORMIN (GLUCOPHAGE) 500 MG tablet Take 1 tablet (500 mg total) by mouth 2 (two) times daily with a meal.  60 tablet  6  . rivaroxaban (XARELTO) 20 MG TABS tablet Take 1 tablet (20 mg total) by mouth daily with supper.  30 tablet  12  . spironolactone  (ALDACTONE) 25 MG tablet Take 1 tablet (25 mg total) by mouth daily.  30 tablet  6   No current facility-administered medications for this visit.    No Known Allergies  Review of Systems negative except from HPI and PMH  Physical Exam BP 138/79  Pulse 64  Ht '6\' 2"'  (1.88 m)  Wt 211 lb (95.709 kg)  BMI 27.08 kg/m2 Well developed and well nourished in no acute distress HENT normal E scleral and icterus clear Neck Supple JVP flat; carotids brisk and full Clear to ausculation  Regular rate and rhythm, 2/6 early sys murmur preserved S2 Soft with active bowel sounds No clubbing cyanosis none Edema Alert and oriented, grossly normal motor and sensory function Skin Warm and Dry  ECG demonstrates sinus rhythm at 49 Intervals 20/13/40 Poor R-wave progression Left ventricular hypertrophy with repolarization abnormalities  Assessment and  Plan  Ischemic cardio myopathy  Congestive heart failure-chronic systolic  Left bundle branch block-rate related  Atrial fibrillation  The patient is anticoagulated currently. This is appropriate.  We will reassess left ventricular function on guideline directed medical therapy. In the event that his ejection fraction remains depressed, he would be a candidate for ICD plus minus CRT  Is euvolemic. We will continue him on his current medicines  We reviewed the physiology and the implications of his cardio myopathy extensively.  > 40 min

## 2013-09-15 NOTE — Patient Instructions (Signed)
Your physician recommends that you continue on your current medications as directed. Please refer to the Current Medication list given to you today.  Your physician has requested that you have an echocardiogram. Echocardiography is a painless test that uses sound waves to create images of your heart. It provides your doctor with information about the size and shape of your heart and how well your heart's chambers and valves are working. This procedure takes approximately one hour. There are no restrictions for this procedure.  Your physician recommends that you schedule a follow-up appointment in: 4 months with Dr. Klein.   

## 2013-09-20 ENCOUNTER — Ambulatory Visit (HOSPITAL_COMMUNITY)
Admission: RE | Admit: 2013-09-20 | Discharge: 2013-09-20 | Disposition: A | Discharge status: Home / Self Care | Payer: Medicare HMO | Source: Ambulatory Visit | Attending: Cardiovascular Disease | Admitting: Cardiovascular Disease

## 2013-09-20 DIAGNOSIS — I059 Rheumatic mitral valve disease, unspecified: Secondary | ICD-10-CM | POA: Insufficient documentation

## 2013-09-20 DIAGNOSIS — R9389 Abnormal findings on diagnostic imaging of other specified body structures: Secondary | ICD-10-CM | POA: Diagnosis not present

## 2013-09-20 DIAGNOSIS — I4892 Unspecified atrial flutter: Secondary | ICD-10-CM | POA: Diagnosis present

## 2013-09-20 DIAGNOSIS — E785 Hyperlipidemia, unspecified: Secondary | ICD-10-CM | POA: Diagnosis not present

## 2013-09-20 DIAGNOSIS — I255 Ischemic cardiomyopathy: Secondary | ICD-10-CM

## 2013-09-20 DIAGNOSIS — I2589 Other forms of chronic ischemic heart disease: Secondary | ICD-10-CM | POA: Insufficient documentation

## 2013-09-20 DIAGNOSIS — I2510 Atherosclerotic heart disease of native coronary artery without angina pectoris: Secondary | ICD-10-CM | POA: Diagnosis not present

## 2013-09-20 DIAGNOSIS — I079 Rheumatic tricuspid valve disease, unspecified: Secondary | ICD-10-CM | POA: Insufficient documentation

## 2013-09-20 NOTE — Progress Notes (Signed)
2D Echo Performed 09/20/2013    Rosamond Andress, RCS  

## 2013-09-22 ENCOUNTER — Encounter: Payer: Self-pay | Admitting: Family Medicine

## 2013-10-10 ENCOUNTER — Telehealth: Payer: Self-pay | Admitting: Internal Medicine

## 2013-10-10 ENCOUNTER — Encounter: Payer: Self-pay | Admitting: Internal Medicine

## 2013-10-10 NOTE — Progress Notes (Addendum)
Reviewed results with patient who verbalized understanding. Patient OV made for 10/8   Notes Recorded by Duke Salvia, MD on 10/04/2013 at 8:21 AM Please Inform Patient Echo shows persistent severe LV dysfunction Please arrange ASAP WOKO OV follouwp to discuss results and possible ICD

## 2013-10-10 NOTE — Telephone Encounter (Signed)
Reviewed echo results with patient. Confirmed OV 10/8 to discuss next step in plan of care. Patient verbalized understanding and agreeable to plan.

## 2013-10-10 NOTE — Telephone Encounter (Signed)
This encounter was created in error - please disregard.

## 2013-10-10 NOTE — Telephone Encounter (Signed)
New message ° ° ° ° °Returning Sherri's call °

## 2013-10-10 NOTE — Addendum Note (Signed)
Encounter addended by: Baird Lyons, RN on: 10/10/2013  8:51 AM<BR>     Documentation filed: Clinical Notes

## 2013-10-10 NOTE — Telephone Encounter (Signed)
New message           Pt is returning call from nurse from last week

## 2013-10-11 ENCOUNTER — Encounter: Payer: Self-pay | Admitting: Internal Medicine

## 2013-10-11 ENCOUNTER — Ambulatory Visit (INDEPENDENT_AMBULATORY_CARE_PROVIDER_SITE_OTHER): Payer: Commercial Managed Care - HMO | Admitting: Internal Medicine

## 2013-10-11 VITALS — BP 162/74 | HR 53 | Ht 74.0 in | Wt 214.8 lb

## 2013-10-11 DIAGNOSIS — I480 Paroxysmal atrial fibrillation: Secondary | ICD-10-CM

## 2013-10-11 DIAGNOSIS — I2589 Other forms of chronic ischemic heart disease: Secondary | ICD-10-CM

## 2013-10-11 DIAGNOSIS — R0989 Other specified symptoms and signs involving the circulatory and respiratory systems: Secondary | ICD-10-CM

## 2013-10-11 DIAGNOSIS — I4891 Unspecified atrial fibrillation: Secondary | ICD-10-CM

## 2013-10-11 DIAGNOSIS — I255 Ischemic cardiomyopathy: Secondary | ICD-10-CM

## 2013-10-11 NOTE — Patient Instructions (Signed)
Your physician recommends that you continue on your current medications as directed. Please refer to the Current Medication list given to you today.  You will receive a phone call with information regarding your screen for SICD.

## 2013-10-11 NOTE — Progress Notes (Signed)
Thank you     Patient Care Team: Mosie Lukes, MD as PCP - General (Family Medicine)   HPI  Larry Rangel is a 63 y.o. male Seen in followup for CHF in the setting of ischemic cardiomyopathy with prior bypass surgery-2009.  During hospitalization 7/15 EF was noted to be 20%. He had recurrences of atrial fibrillation.he underwent cath showing:  1. Severe triple vessel CAD s/p 5V CABG with 3/5 patent bypass grafts. The graft to the left sided PL is occluded and the graft to the diagonal is occluded (both appear to be chronic occlusions)  2. Moderate disease in diagonal and left sided posterolateral branch, does not appear to be flow limiting.  3. Elevated filling pressures  ECG demonstrated left bundle branch block with a QRS duration of 150. Repeat ECG shows a QRS duration of 130 milliseconds and a more nonspecific pattern  Since discharge he has lost about 15 pounds. This is on a no carb diet. He is able to climb stairs. He has had no edema or chest pain.  He underwent repeat echocardiogram 9/15 demonstrated persistent severe LV dysfunction with an EF of 15%.  Last potassium and are records 8/17 at 4.4       Past Medical History  Diagnosis Date  . Anxiety   . Hypertension   . Low back pain   . Tobacco user   . CAD (coronary artery disease)     a. s/p CABG 2009. b. Cath 07/2013: 3/5 patent grafts (appear to have chronic occ grafts), mod diag/L PL branch, did not appear flow limiting, elevated filling pressures.  . Hyperlipidemia   . S/P colonoscopy   . Myocardial infarction   . Arthritis   . Paroxysmal atrial flutter 07-2012    a. s/p ablation by Dr [name] 08-06-2012. b. Recurrence 07/2013.   Marland Kitchen Chicken pox as a child  . Measles as a child  . Mumps as a child  . Abnormal liver function 08/23/2010  . Chronic combined systolic and diastolic CHF (congestive heart failure)     a. Echo 6/14: Mild LVH, EF 30-35%, diffuse HK, MAC, mild BAE. b. Drop in EF to 20% by echo 07/2013.    . Diabetes mellitus   . PAF (paroxysmal atrial fibrillation)   . LBBB (left bundle branch block)     Past Surgical History  Procedure Laterality Date  . Shoulder surgery    . Coronary artery bypass graft  2009    x 5  . Tee without cardioversion N/A 07/15/2012    Procedure: TRANSESOPHAGEAL ECHOCARDIOGRAM (TEE);  Surgeon: Thayer Headings, MD;  Location: [place];  Service: Cardiovascular;  Laterality: N/A;  . Cardioversion N/A 07/15/2012    Procedure: CARDIOVERSION;  Surgeon: Thayer Headings, MD;  Location: [place];  Service: Cardiovascular;  Laterality: N/A;  . Ablation of dysrhythmic focus  08/06/2012    CTI ablation by Dr [name]  . Tonsillectomy    . Cardiac catheterization      Current Outpatient Prescriptions  Medication Sig Dispense Refill  . ACCU-CHEK FASTCLIX LANCETS MISC       . ACCU-CHEK SMARTVIEW test strip       . atorvastatin (LIPITOR) 20 MG tablet Take 1 tablet (20 mg total) by mouth daily.  90 tablet  1  . Blood Glucose Monitoring Suppl (ACCU-CHEK NANO SMARTVIEW) W/DEVICE KIT       . busPIRone (BUSPAR) 10 MG tablet Take 1 tablet (10 mg total) by mouth 2 (two) times daily.  Pillow  tablet  1  . carvedilol (COREG) 12.5 MG tablet Take 1 tablet (12.5 mg total) by mouth 2 (two) times daily with a meal.  60 tablet  12  . clonazePAM (KLONOPIN) 0.5 MG tablet TAKE 1 TABLET BY MOUTH 3 TIMES A DAY AS NEEDED FOR ANXIETY  90 tablet  2  . cyclobenzaprine (FLEXERIL) 10 MG tablet TAKE 1 TABLET BY MOUTH 3 TIMES A DAY AS NEEDED  90 tablet  3  . furosemide (LASIX) 40 MG tablet Take 1 tablet (40 mg total) by mouth daily. if weight increased > 3 lb overnight increase Lasix by 1 tablet  45 tablet  3  . glipiZIDE (GLUCOTROL) 2.5 mg TABS tablet Take 0.5 tablets (2.5 mg total) by mouth daily before breakfast.  30 tablet  12  . irbesartan (AVAPRO) 300 MG tablet Take 1 tablet (300 mg total) by mouth daily.  30 tablet  12  . metFORMIN (GLUCOPHAGE) 500 MG tablet Take 1 tablet (500 mg total) by  mouth 2 (two) times daily with a meal.  60 tablet  6  . rivaroxaban (XARELTO) 20 MG TABS tablet Take 1 tablet (20 mg total) by mouth daily with supper.  30 tablet  12  . spironolactone (ALDACTONE) 25 MG tablet Take 1 tablet (25 mg total) by mouth daily.  30 tablet  6   No current facility-administered medications for this visit.    No Known Allergies  Review of Systems negative except from HPI and PMH  Physical Exam BP 162/74  Pulse 53  Ht '6\' 2"'  (1.88 m)  Wt 214 lb 12.8 oz (97.433 kg)  BMI 27.57 kg/m2 Well developed and well nourished in no acute distress HENT normal E scleral and icterus clear Neck Supple JVP flat; carotids brisk and full Clear to ausculation  Regular rate and rhythm, 2/6 early sys murmur preserved S2 Soft with active bowel sounds No clubbing cyanosis none Edema Alert and oriented, grossly normal motor and sensory function Skin Warm and Dry  ECG demonstrates sinus rhythm at 49 Intervals 20/13/40 Poor R-wave progression Left ventricular hypertrophy with repolarization abnormalities  Assessment and  Plan  Ischemic cardio myopathy  Congestive heart failure-class 1 currently  Left bundle branch block-rate related  Atrial fibrillation conitnue Rivaroxaban   Hypertension   He is an appropriate candidate for ICD implantation. We discussed S. ICD versus transvenous ICD. He would like to pursue the former. He will get screening. We have discussed procedure risks and benefits.  Given his functional status and he is relatively narrow QRS, we will not pursue CRT-D.  We will follow his blood pressure. Mostly he says at home in his lower.

## 2013-10-19 ENCOUNTER — Other Ambulatory Visit: Payer: Self-pay | Admitting: Family Medicine

## 2013-10-19 NOTE — Telephone Encounter (Signed)
eScribe request from [facility] for refill on Clonazepam 0.5 mg TID PRN Last filled - 06.24.15, #90x2 Last AEX - 08.24.15 Next AEX - 2 Mths. [Appt scheduled for 11.09.15] Please Advise on refills/SLS

## 2013-10-20 ENCOUNTER — Ambulatory Visit: Payer: Commercial Managed Care - HMO | Admitting: Internal Medicine

## 2013-10-21 NOTE — Telephone Encounter (Signed)
OK to refill

## 2013-10-24 ENCOUNTER — Other Ambulatory Visit: Payer: Self-pay | Admitting: Family Medicine

## 2013-10-24 LAB — ICD DEVICE OBSERVATION

## 2013-10-31 ENCOUNTER — Telehealth: Payer: Self-pay | Admitting: *Deleted

## 2013-10-31 DIAGNOSIS — Z01812 Encounter for preprocedural laboratory examination: Secondary | ICD-10-CM

## 2013-10-31 DIAGNOSIS — I255 Ischemic cardiomyopathy: Secondary | ICD-10-CM

## 2013-10-31 NOTE — Telephone Encounter (Signed)
Patient called me to schedule pre procedure labs for 10/22.  Spoke with patient last week to schedule SICD for 10/29. Wound check scheduled for 11/9.

## 2013-11-01 ENCOUNTER — Encounter (HOSPITAL_COMMUNITY): Payer: Self-pay | Admitting: Pharmacy Technician

## 2013-11-03 ENCOUNTER — Encounter: Payer: Self-pay | Admitting: *Deleted

## 2013-11-03 ENCOUNTER — Other Ambulatory Visit (INDEPENDENT_AMBULATORY_CARE_PROVIDER_SITE_OTHER): Payer: Medicare HMO | Admitting: *Deleted

## 2013-11-03 DIAGNOSIS — Z01812 Encounter for preprocedural laboratory examination: Secondary | ICD-10-CM

## 2013-11-03 DIAGNOSIS — I255 Ischemic cardiomyopathy: Secondary | ICD-10-CM

## 2013-11-03 LAB — CBC WITH DIFFERENTIAL/PLATELET
Basophils Absolute: 0 10*3/uL (ref 0.0–0.1)
Basophils Relative: 0.6 % (ref 0.0–3.0)
EOS ABS: 0.3 10*3/uL (ref 0.0–0.7)
EOS PCT: 4.5 % (ref 0.0–5.0)
HCT: 45.1 % (ref 39.0–52.0)
Hemoglobin: 15.1 g/dL (ref 13.0–17.0)
LYMPHS PCT: 26.7 % (ref 12.0–46.0)
Lymphs Abs: 2 10*3/uL (ref 0.7–4.0)
MCHC: 33.6 g/dL (ref 30.0–36.0)
MCV: 86.7 fl (ref 78.0–100.0)
Monocytes Absolute: 0.5 10*3/uL (ref 0.1–1.0)
Monocytes Relative: 6.3 % (ref 3.0–12.0)
NEUTROS PCT: 61.9 % (ref 43.0–77.0)
Neutro Abs: 4.6 10*3/uL (ref 1.4–7.7)
Platelets: 171 10*3/uL (ref 150.0–400.0)
RBC: 5.2 Mil/uL (ref 4.22–5.81)
RDW: 14.8 % (ref 11.5–15.5)
WBC: 7.5 10*3/uL (ref 4.0–10.5)

## 2013-11-03 LAB — BASIC METABOLIC PANEL
BUN: 11 mg/dL (ref 6–23)
CALCIUM: 9.9 mg/dL (ref 8.4–10.5)
CO2: 25 mEq/L (ref 19–32)
CREATININE: 1 mg/dL (ref 0.4–1.5)
Chloride: 106 mEq/L (ref 96–112)
GFR: 78.68 mL/min (ref >60.00)
Glucose, Bld: 146 mg/dL — ABNORMAL HIGH (ref 70–99)
Potassium: 4.1 mEq/L (ref 3.5–5.1)
Sodium: 139 mEq/L (ref 135–145)

## 2013-11-04 ENCOUNTER — Encounter: Payer: Self-pay | Admitting: Internal Medicine

## 2013-11-09 DIAGNOSIS — I10 Essential (primary) hypertension: Secondary | ICD-10-CM | POA: Diagnosis not present

## 2013-11-09 DIAGNOSIS — G4730 Sleep apnea, unspecified: Secondary | ICD-10-CM | POA: Diagnosis not present

## 2013-11-09 DIAGNOSIS — R001 Bradycardia, unspecified: Secondary | ICD-10-CM | POA: Diagnosis present

## 2013-11-09 DIAGNOSIS — Z951 Presence of aortocoronary bypass graft: Secondary | ICD-10-CM | POA: Diagnosis not present

## 2013-11-09 DIAGNOSIS — I255 Ischemic cardiomyopathy: Secondary | ICD-10-CM | POA: Diagnosis not present

## 2013-11-09 DIAGNOSIS — I509 Heart failure, unspecified: Secondary | ICD-10-CM | POA: Diagnosis not present

## 2013-11-09 DIAGNOSIS — Z7901 Long term (current) use of anticoagulants: Secondary | ICD-10-CM | POA: Diagnosis not present

## 2013-11-09 DIAGNOSIS — E119 Type 2 diabetes mellitus without complications: Secondary | ICD-10-CM | POA: Diagnosis not present

## 2013-11-09 DIAGNOSIS — I252 Old myocardial infarction: Secondary | ICD-10-CM | POA: Diagnosis not present

## 2013-11-09 DIAGNOSIS — I5042 Chronic combined systolic (congestive) and diastolic (congestive) heart failure: Secondary | ICD-10-CM | POA: Diagnosis not present

## 2013-11-09 DIAGNOSIS — Z87891 Personal history of nicotine dependence: Secondary | ICD-10-CM | POA: Diagnosis not present

## 2013-11-09 DIAGNOSIS — I480 Paroxysmal atrial fibrillation: Secondary | ICD-10-CM | POA: Diagnosis not present

## 2013-11-09 DIAGNOSIS — I2510 Atherosclerotic heart disease of native coronary artery without angina pectoris: Secondary | ICD-10-CM | POA: Diagnosis not present

## 2013-11-09 DIAGNOSIS — I25810 Atherosclerosis of coronary artery bypass graft(s) without angina pectoris: Secondary | ICD-10-CM | POA: Diagnosis not present

## 2013-11-09 DIAGNOSIS — Z79899 Other long term (current) drug therapy: Secondary | ICD-10-CM | POA: Diagnosis not present

## 2013-11-09 MED ORDER — CHLORHEXIDINE GLUCONATE 4 % EX LIQD
60.0000 mL | Freq: Once | CUTANEOUS | Status: DC
  Filled 2013-11-09: qty 60

## 2013-11-09 MED ORDER — SODIUM CHLORIDE 0.9 % IR SOLN
80.0000 mg | Status: DC
  Filled 2013-11-09: qty 2

## 2013-11-09 MED ORDER — SODIUM CHLORIDE 0.9 % IV SOLN: INTRAVENOUS | Status: DC

## 2013-11-09 MED ORDER — CEFAZOLIN SODIUM-DEXTROSE 2-3 GM-% IV SOLR: 2.0000 g | INTRAVENOUS | Status: DC

## 2013-11-09 MED ORDER — SODIUM CHLORIDE 0.9 % IV SOLN
INTRAVENOUS | Status: DC
  Administered 2013-11-10: 10:00:00 via INTRAVENOUS

## 2013-11-10 ENCOUNTER — Ambulatory Visit (HOSPITAL_COMMUNITY): Payer: Medicare HMO | Admitting: Certified Registered"

## 2013-11-10 ENCOUNTER — Encounter (HOSPITAL_COMMUNITY): Payer: Medicare HMO | Admitting: Certified Registered"

## 2013-11-10 ENCOUNTER — Encounter (HOSPITAL_COMMUNITY): Payer: Self-pay | Admitting: Certified Registered"

## 2013-11-10 ENCOUNTER — Ambulatory Visit (HOSPITAL_COMMUNITY)
Admission: RE | Admit: 2013-11-10 | Discharge: 2013-11-10 | Disposition: A | Discharge status: Home / Self Care | Payer: Medicare HMO | Source: Ambulatory Visit | Attending: Internal Medicine | Admitting: Internal Medicine

## 2013-11-10 ENCOUNTER — Encounter (HOSPITAL_COMMUNITY)
Admission: RE | Disposition: A | Discharge status: Home / Self Care | Payer: Self-pay | Source: Ambulatory Visit | Attending: Internal Medicine

## 2013-11-10 DIAGNOSIS — Z87891 Personal history of nicotine dependence: Secondary | ICD-10-CM | POA: Insufficient documentation

## 2013-11-10 DIAGNOSIS — E119 Type 2 diabetes mellitus without complications: Secondary | ICD-10-CM | POA: Insufficient documentation

## 2013-11-10 DIAGNOSIS — I509 Heart failure, unspecified: Secondary | ICD-10-CM | POA: Insufficient documentation

## 2013-11-10 DIAGNOSIS — R001 Bradycardia, unspecified: Secondary | ICD-10-CM | POA: Insufficient documentation

## 2013-11-10 DIAGNOSIS — I480 Paroxysmal atrial fibrillation: Secondary | ICD-10-CM | POA: Insufficient documentation

## 2013-11-10 DIAGNOSIS — I252 Old myocardial infarction: Secondary | ICD-10-CM | POA: Insufficient documentation

## 2013-11-10 DIAGNOSIS — G4730 Sleep apnea, unspecified: Secondary | ICD-10-CM | POA: Insufficient documentation

## 2013-11-10 DIAGNOSIS — I255 Ischemic cardiomyopathy: Secondary | ICD-10-CM | POA: Insufficient documentation

## 2013-11-10 DIAGNOSIS — Z951 Presence of aortocoronary bypass graft: Secondary | ICD-10-CM | POA: Insufficient documentation

## 2013-11-10 DIAGNOSIS — I25810 Atherosclerosis of coronary artery bypass graft(s) without angina pectoris: Secondary | ICD-10-CM | POA: Insufficient documentation

## 2013-11-10 DIAGNOSIS — I10 Essential (primary) hypertension: Secondary | ICD-10-CM | POA: Insufficient documentation

## 2013-11-10 DIAGNOSIS — I2510 Atherosclerotic heart disease of native coronary artery without angina pectoris: Secondary | ICD-10-CM | POA: Insufficient documentation

## 2013-11-10 DIAGNOSIS — Z7901 Long term (current) use of anticoagulants: Secondary | ICD-10-CM | POA: Insufficient documentation

## 2013-11-10 DIAGNOSIS — I5042 Chronic combined systolic (congestive) and diastolic (congestive) heart failure: Secondary | ICD-10-CM | POA: Insufficient documentation

## 2013-11-10 DIAGNOSIS — Z79899 Other long term (current) drug therapy: Secondary | ICD-10-CM | POA: Insufficient documentation

## 2013-11-10 DIAGNOSIS — Z950 Presence of cardiac pacemaker: Secondary | ICD-10-CM

## 2013-11-10 HISTORY — PX: IMPLANTABLE CARDIOVERTER DEFIBRILLATOR IMPLANT: SHX5473

## 2013-11-10 LAB — GLUCOSE, CAPILLARY: Glucose-Capillary: 117 mg/dL — ABNORMAL HIGH (ref 70–99)

## 2013-11-10 LAB — SURGICAL PCR SCREEN
MRSA, PCR: NEGATIVE
STAPHYLOCOCCUS AUREUS: NEGATIVE

## 2013-11-10 SURGERY — IMPLANTABLE CARDIOVERTER DEFIBRILLATOR IMPLANT
Anesthesia: General

## 2013-11-10 MED ORDER — CEFAZOLIN SODIUM 1-5 GM-% IV SOLN: 1.0000 g | Freq: Four times a day (QID) | INTRAVENOUS | Status: DC

## 2013-11-10 MED ORDER — ROCURONIUM BROMIDE 100 MG/10ML IV SOLN
INTRAVENOUS | Status: DC | PRN
  Administered 2013-11-10: 10 mg via INTRAVENOUS
  Administered 2013-11-10: 50 mg via INTRAVENOUS

## 2013-11-10 MED ORDER — IRBESARTAN 300 MG PO TABS: 300.0000 mg | ORAL_TABLET | Freq: Every day | ORAL | Status: DC

## 2013-11-10 MED ORDER — CARVEDILOL 12.5 MG PO TABS
12.5000 mg | ORAL_TABLET | Freq: Two times a day (BID) | ORAL | Status: DC
  Filled 2013-11-10 (×2): qty 1

## 2013-11-10 MED ORDER — LIDOCAINE HCL (CARDIAC) 20 MG/ML IV SOLN
INTRAVENOUS | Status: DC | PRN
  Administered 2013-11-10: 100 mg via INTRAVENOUS

## 2013-11-10 MED ORDER — ONDANSETRON HCL 4 MG/2ML IJ SOLN
INTRAMUSCULAR | Status: DC | PRN
  Administered 2013-11-10: 4 mg via INTRAVENOUS

## 2013-11-10 MED ORDER — HYDROCODONE-ACETAMINOPHEN 5-300 MG PO TABS: 1.0000 | ORAL_TABLET | Freq: Four times a day (QID) | ORAL | Status: DC | PRN

## 2013-11-10 MED ORDER — OXYCODONE HCL 5 MG PO TABS: 5.0000 mg | ORAL_TABLET | Freq: Once | ORAL | Status: DC | PRN

## 2013-11-10 MED ORDER — GLYCOPYRROLATE 0.2 MG/ML IJ SOLN
INTRAMUSCULAR | Status: DC | PRN
  Administered 2013-11-10: 0.2 mg via INTRAVENOUS
  Administered 2013-11-10: 0.4 mg via INTRAVENOUS

## 2013-11-10 MED ORDER — NEOSTIGMINE METHYLSULFATE 10 MG/10ML IV SOLN
INTRAVENOUS | Status: DC | PRN
  Administered 2013-11-10: 3 mg via INTRAVENOUS

## 2013-11-10 MED ORDER — BUPIVACAINE HCL (PF) 0.25 % IJ SOLN
INTRAMUSCULAR | Status: AC
  Filled 2013-11-10: qty 30

## 2013-11-10 MED ORDER — OXYCODONE HCL 5 MG/5ML PO SOLN: 5.0000 mg | Freq: Once | ORAL | Status: DC | PRN

## 2013-11-10 MED ORDER — FENTANYL CITRATE 0.05 MG/ML IJ SOLN
INTRAMUSCULAR | Status: DC | PRN
  Administered 2013-11-10: 50 ug via INTRAVENOUS
  Administered 2013-11-10: 150 ug via INTRAVENOUS
  Administered 2013-11-10: 50 ug via INTRAVENOUS

## 2013-11-10 MED ORDER — FUROSEMIDE 40 MG PO TABS
40.0000 mg | ORAL_TABLET | Freq: Every day | ORAL | Status: DC | PRN
  Filled 2013-11-10: qty 1

## 2013-11-10 MED ORDER — MUPIROCIN 2 % EX OINT
TOPICAL_OINTMENT | Freq: Once | CUTANEOUS | Status: AC
  Administered 2013-11-10: 10:00:00 via NASAL
  Filled 2013-11-10: qty 22

## 2013-11-10 MED ORDER — CLONAZEPAM 0.5 MG PO TABS: 0.5000 mg | ORAL_TABLET | Freq: Three times a day (TID) | ORAL | Status: DC | PRN

## 2013-11-10 MED ORDER — ACETAMINOPHEN 325 MG PO TABS: 325.0000 mg | ORAL_TABLET | ORAL | Status: DC | PRN

## 2013-11-10 MED ORDER — CEFAZOLIN SODIUM 1-5 GM-% IV SOLN
1.0000 g | Freq: Four times a day (QID) | INTRAVENOUS | Status: DC
  Filled 2013-11-10 (×3): qty 50

## 2013-11-10 MED ORDER — EPHEDRINE SULFATE 50 MG/ML IJ SOLN
INTRAMUSCULAR | Status: DC | PRN
  Administered 2013-11-10 (×2): 2.5 mg via INTRAVENOUS
  Administered 2013-11-10: 5 mg via INTRAVENOUS
  Administered 2013-11-10: 2.5 mg via INTRAVENOUS

## 2013-11-10 MED ORDER — HYDROMORPHONE HCL 1 MG/ML IJ SOLN: 0.2500 mg | INTRAMUSCULAR | Status: DC | PRN

## 2013-11-10 MED ORDER — BUSPIRONE HCL 10 MG PO TABS
10.0000 mg | ORAL_TABLET | Freq: Two times a day (BID) | ORAL | Status: DC
  Filled 2013-11-10: qty 1

## 2013-11-10 MED ORDER — ATORVASTATIN CALCIUM 20 MG PO TABS: 20.0000 mg | ORAL_TABLET | Freq: Every day | ORAL | Status: DC

## 2013-11-10 MED ORDER — GLIPIZIDE 2.5 MG HALF TABLET
2.5000 mg | ORAL_TABLET | Freq: Every day | ORAL | Status: DC
  Filled 2013-11-10: qty 1

## 2013-11-10 MED ORDER — MIDAZOLAM HCL 5 MG/5ML IJ SOLN
INTRAMUSCULAR | Status: DC | PRN
  Administered 2013-11-10: 2 mg via INTRAVENOUS

## 2013-11-10 MED ORDER — RIVAROXABAN 20 MG PO TABS
20.0000 mg | ORAL_TABLET | Freq: Every day | ORAL | Status: DC
  Filled 2013-11-10: qty 1

## 2013-11-10 MED ORDER — PHENYLEPHRINE HCL 10 MG/ML IJ SOLN
INTRAMUSCULAR | Status: DC | PRN
  Administered 2013-11-10: 40 ug via INTRAVENOUS

## 2013-11-10 MED ORDER — ONDANSETRON HCL 4 MG/2ML IJ SOLN: 4.0000 mg | Freq: Four times a day (QID) | INTRAMUSCULAR | Status: DC | PRN

## 2013-11-10 MED ORDER — CYCLOBENZAPRINE HCL 10 MG PO TABS: 10.0000 mg | ORAL_TABLET | Freq: Three times a day (TID) | ORAL | Status: DC | PRN

## 2013-11-10 MED ORDER — SPIRONOLACTONE 25 MG PO TABS
25.0000 mg | ORAL_TABLET | Freq: Every day | ORAL | Status: DC
  Filled 2013-11-10: qty 1

## 2013-11-10 MED ORDER — PROMETHAZINE HCL 25 MG/ML IJ SOLN: 6.2500 mg | INTRAMUSCULAR | Status: DC | PRN

## 2013-11-10 MED ORDER — METFORMIN HCL 500 MG PO TABS
500.0000 mg | ORAL_TABLET | Freq: Two times a day (BID) | ORAL | Status: DC
  Filled 2013-11-10 (×2): qty 1

## 2013-11-10 MED ORDER — ETOMIDATE 2 MG/ML IV SOLN
INTRAVENOUS | Status: DC | PRN
  Administered 2013-11-10: 20 mg via INTRAVENOUS

## 2013-11-10 MED ORDER — SODIUM CHLORIDE 0.9 % IV SOLN: INTRAVENOUS | Status: AC

## 2013-11-10 MED ORDER — SODIUM CHLORIDE 0.9 % IV SOLN
INTRAVENOUS | Status: AC
  Administered 2013-11-10: 15:00:00 via INTRAVENOUS

## 2013-11-10 MED ORDER — MUPIROCIN 2 % EX OINT
TOPICAL_OINTMENT | CUTANEOUS | Status: AC
  Administered 2013-11-10: 15:00:00
  Filled 2013-11-10: qty 22

## 2013-11-10 NOTE — Op Note (Signed)
Larry Rangel, Larry Rangel NO.:  [id number]  MEDICAL RECORD NO.:  [id number]  LOCATION:  2W30C                        FACILITY:  [facility]  PHYSICIAN:  Duke Salvia, MD, FACCDATE OF BIRTH:  [birth year]  DATE OF PROCEDURE:  11/10/2013 DATE OF DISCHARGE:  11/10/2013                              OPERATIVE REPORT   PREOPERATIVE DIAGNOSIS:  Ischemic cardiomyopathy with asymptomatic bradycardia.  POSTOPERATIVE DIAGNOSIS:  Ischemic cardiomyopathy with asymptomatic bradycardia.  PROCEDURE:  Subcutaneous ICD implantation with intraoperative defibrillation threshold testing.  DESCRIPTION OF PROCEDURE:  Following obtaining informed consent, the patient was brought to electrophysiology laboratory and placed on the fluoroscopic table in supine position.  The patient was mapped fluoroscopically.  The patient was then prepped and draped and submitted for general anesthesia under the care of Dr. Krista Blue.  After routine prep and drape, a curvilinear incision was made inferior and lateral to the left breast crease.  It was taken down to layer the sub costal fascia and then a plane was excavated along the fascia, eliminating as much of the subcutaneous fat as possible.  This having been accomplished, a 2 cm incision was made over the inferior sternal area.  It was carried down to layer of the sternal fascia.  Two anchoring sutures were placed.  At this point, the lead was tunneled from the lateral pocket to the subxiphoid pocket.  This was a [employer] SQ1 lead, model [id number], serial [id number].  Having been tunneled from the lateral to the xiphoid pocket, a tunneling tool with a sheath was deployed on the left side of the sternum in a cephalad direction towards the sternal notch.  This having been accomplished, the lead was placed in the sheath and delivered as cephalad as possible.  Fluoroscopy demonstrated that the tip electrode was quite proximate to the sternal wire, and so  the lead was repositioned caudally about a half a centimeter.  The lead was then secured to the sternal fascia and a 2-0 closure was accomplished at this incision.  The lead was then attached to a [employer] S-ICD, model [id number], serial [id number].  The pocket was copiously irrigated with antibiotic containing saline solution as had the xiphoid pocket been. An anchoring suture was placed in the inferomedial aspect of the pocket to secure the device.  Surgicel was placed on the posterior and the anterior surface of the pocket and the device was placed into the pocket and secured.  The Surgicel was used to decrease intrathoracic impedance and to remove any air.  The pocket was then massaged.  A 10 joule impedance was 68 ohms.  Ventricular fibrillation was induced.  After total duration of 24 seconds, a 65 joule shock was delivered through a measured resistance of 68 ohms terminating ventricular fibrillation and restoring sinus rhythm. I should note that this lateral pocket had been closed with a 2-0 layer prior to defibrillation threshold testing.  Sinus rhythm having been restored, the pockets were then closed in their final layers.  The wounds were washed, dried, and a Dermabond dressing was applied.  Needle counts, sponge counts, and instrument counts were correct at the end of the procedure according to staff.  The patient tolerated the  procedure without apparent complication.     Duke SalviaSteven C. Klein, MD, [facility] IncFACC     SCK/MEDQ  D:  11/10/2013  T:  11/10/2013  Job:  [id number]

## 2013-11-10 NOTE — Anesthesia Postprocedure Evaluation (Signed)
  Anesthesia Post-op Note  Patient: Larry Rangel  Procedure(s) Performed: Procedure(s): SUB Q IMPLANTABLE CARDIOVERTER DEFIBRILLATOR IMPLANT (N/A)  Patient Location: Cath Lab  Anesthesia Type:General  Level of Consciousness: awake, alert , oriented and patient cooperative  Airway and Oxygen Therapy: Patient Spontanous Breathing and Patient connected to nasal cannula oxygen  Post-op Pain: none  Post-op Assessment: Post-op Vital signs reviewed, Patient's Cardiovascular Status Stable, Respiratory Function Stable, Patent Airway, No signs of Nausea or vomiting and Adequate PO intake  Post-op Vital Signs: Reviewed and stable  Last Vitals:  Filed Vitals:   11/10/13 1310  BP: 157/60  Pulse: 51  Temp:   Resp: 10    Complications: No apparent anesthesia complications

## 2013-11-10 NOTE — Transfer of Care (Signed)
Immediate Anesthesia Transfer of Care Note  Patient: Larry Rangel  Procedure(s) Performed: Procedure(s): SUB Q IMPLANTABLE CARDIOVERTER DEFIBRILLATOR IMPLANT (N/A)  Patient Location: PACU  Anesthesia Type:General  Level of Consciousness: awake, alert , oriented and patient cooperative  Airway & Oxygen Therapy: Patient Spontanous Breathing and Patient connected to nasal cannula oxygen  Post-op Assessment: Report given to PACU RN, Post -op Vital signs reviewed and stable and Patient moving all extremities  Post vital signs: Reviewed and stable  Complications: No apparent anesthesia complications

## 2013-11-10 NOTE — Anesthesia Preprocedure Evaluation (Signed)
Anesthesia Evaluation    Reviewed: Allergy & Precautions, H&P , NPO status , Patient's Chart, lab work & pertinent test results  Airway        Dental   Pulmonary sleep apnea and Continuous Positive Airway Pressure Ventilation , former smoker,          Cardiovascular hypertension, + CAD, + Past MI and +CHF + dysrhythmias  Echo 6/14: Mild LVH, EF 30-35%, diffuse HK, MAC, mild BAE. b. Drop in EF to 20% by echo 07/2013. s/p CABG 2009. b. Cath 07/2013: 3/5 patent grafts (appear to have chronic occ grafts), mod diag/L PL branch, did not appear flow limiting, elevated filling pressures.   Neuro/Psych Anxiety    GI/Hepatic Neg liver ROS,   Endo/Other  diabetes  Renal/GU negative Renal ROS     Musculoskeletal  (+) Arthritis -,   Abdominal   Peds  Hematology   Anesthesia Other Findings   Reproductive/Obstetrics                             Anesthesia Physical Anesthesia Plan  ASA: III  Anesthesia Plan: General   Post-op Pain Management:    Induction: Intravenous  Airway Management Planned: Oral ETT  Additional Equipment: Arterial line  Intra-op Plan:   Post-operative Plan: Extubation in OR  Informed Consent:   Plan Discussed with:   Anesthesia Plan Comments:         Anesthesia Quick Evaluation

## 2013-11-10 NOTE — Progress Notes (Signed)
Orthopedic Tech Progress Note Patient Details:  Larry Rangel [birth year] [id number]  Ortho Devices Type of Ortho Device: Arm sling Ortho Device/Splint Location: LUE Ortho Device/Splint Interventions: Ordered;Application   Jennye Moccasin 11/10/2013, 3:13 PM

## 2013-11-10 NOTE — Interval H&P Note (Signed)
ICD Criteria  Current LVEF:20% ;Obtained > or = 1 month ago and < or = 3 months ago.  NYHA Functional Classification: Class 2  Heart Failure History:  Yes, Duration of heart failure since onset is > 9 months  Non-Ischemic Dilated Cardiomyopathy History:  No.  Atrial Fibrillation/Atrial Flutter:  No.  Ventricular Tachycardia History:  No.  Cardiac Arrest History:  No  History of Syndromes with Risk of Sudden Death:  No.  Previous ICD:  No.  Electrophysiology Study: No.  Prior MI: Yes, Most recent MI Timeframe is < or = to 40 days.  PPM: No.  OSA:  No  Patient Life Expectancy of >=1 year: Yes.  Anticoagulation Therapy:  Patient is NOT on anticoagulation therapy.   Beta Blocker Therapy:  Yes.   Ace Inhibitor/ARB Therapy:  Yes.History and Physical Interval Note:  11/10/2013 9:45 AM  [name]  has presented today for surgery, with the diagnosis of CHF  The various methods of treatment have been discussed with the patient and family. After consideration of risks, benefits and other options for treatment, the patient has consented to  Procedure(s): SUB Q IMPLANTABLE CARDIOVERTER DEFIBRILLATOR IMPLANT (N/A) as a surgical intervention .  The patient's history has been reviewed, patient examined, no change in status, stable for surgery.  I have reviewed the patient's chart and labs.  Questions were answered to the patient's satisfaction.     Sherryl Manges

## 2013-11-10 NOTE — H&P (View-Only) (Signed)
Thank you     Patient Care Team: Mosie Lukes, MD as PCP - General (Family Medicine)   HPI  Larry Rangel is a 63 y.o. male Seen in followup for CHF in the setting of ischemic cardiomyopathy with prior bypass surgery-2009.  During hospitalization 7/15 EF was noted to be 20%. He had recurrences of atrial fibrillation.he underwent cath showing:  1. Severe triple vessel CAD s/p 5V CABG with 3/5 patent bypass grafts. The graft to the left sided PL is occluded and the graft to the diagonal is occluded (both appear to be chronic occlusions)  2. Moderate disease in diagonal and left sided posterolateral branch, does not appear to be flow limiting.  3. Elevated filling pressures  ECG demonstrated left bundle branch block with a QRS duration of 150. Repeat ECG shows a QRS duration of 130 milliseconds and a more nonspecific pattern  Since discharge he has lost about 15 pounds. This is on a no carb diet. He is able to climb stairs. He has had no edema or chest pain.  He underwent repeat echocardiogram 9/15 demonstrated persistent severe LV dysfunction with an EF of 15%.  Last potassium and are records 8/17 at 4.4       Past Medical History  Diagnosis Date  . Anxiety   . Hypertension   . Low back pain   . Tobacco user   . CAD (coronary artery disease)     a. s/p CABG 2009. b. Cath 07/2013: 3/5 patent grafts (appear to have chronic occ grafts), mod diag/L PL branch, did not appear flow limiting, elevated filling pressures.  . Hyperlipidemia   . S/P colonoscopy   . Myocardial infarction   . Arthritis   . Paroxysmal atrial flutter 07-2012    a. s/p ablation by Dr [name] 08-06-2012. b. Recurrence 07/2013.   Marland Kitchen Chicken pox as a child  . Measles as a child  . Mumps as a child  . Abnormal liver function 08/23/2010  . Chronic combined systolic and diastolic CHF (congestive heart failure)     a. Echo 6/14: Mild LVH, EF 30-35%, diffuse HK, MAC, mild BAE. b. Drop in EF to 20% by echo 07/2013.    . Diabetes mellitus   . PAF (paroxysmal atrial fibrillation)   . LBBB (left bundle branch block)     Past Surgical History  Procedure Laterality Date  . Shoulder surgery    . Coronary artery bypass graft  2009    x 5  . Tee without cardioversion N/A 07/15/2012    Procedure: TRANSESOPHAGEAL ECHOCARDIOGRAM (TEE);  Surgeon: Thayer Headings, MD;  Location: [facility];  Service: Cardiovascular;  Laterality: N/A;  . Cardioversion N/A 07/15/2012    Procedure: CARDIOVERSION;  Surgeon: Thayer Headings, MD;  Location: [facility];  Service: Cardiovascular;  Laterality: N/A;  . Ablation of dysrhythmic focus  08/06/2012    CTI ablation by Dr [name]  . Tonsillectomy    . Cardiac catheterization      Current Outpatient Prescriptions  Medication Sig Dispense Refill  . ACCU-CHEK FASTCLIX LANCETS MISC       . ACCU-CHEK SMARTVIEW test strip       . atorvastatin (LIPITOR) 20 MG tablet Take 1 tablet (20 mg total) by mouth daily.  90 tablet  1  . Blood Glucose Monitoring Suppl (ACCU-CHEK NANO SMARTVIEW) W/DEVICE KIT       . busPIRone (BUSPAR) 10 MG tablet Take 1 tablet (10 mg total) by mouth 2 (two) times daily.  [place]  tablet  1  . carvedilol (COREG) 12.5 MG tablet Take 1 tablet (12.5 mg total) by mouth 2 (two) times daily with a meal.  60 tablet  12  . clonazePAM (KLONOPIN) 0.5 MG tablet TAKE 1 TABLET BY MOUTH 3 TIMES A DAY AS NEEDED FOR ANXIETY  90 tablet  2  . cyclobenzaprine (FLEXERIL) 10 MG tablet TAKE 1 TABLET BY MOUTH 3 TIMES A DAY AS NEEDED  90 tablet  3  . furosemide (LASIX) 40 MG tablet Take 1 tablet (40 mg total) by mouth daily. if weight increased > 3 lb overnight increase Lasix by 1 tablet  45 tablet  3  . glipiZIDE (GLUCOTROL) 2.5 mg TABS tablet Take 0.5 tablets (2.5 mg total) by mouth daily before breakfast.  30 tablet  12  . irbesartan (AVAPRO) 300 MG tablet Take 1 tablet (300 mg total) by mouth daily.  30 tablet  12  . metFORMIN (GLUCOPHAGE) 500 MG tablet Take 1 tablet (500 mg total) by  mouth 2 (two) times daily with a meal.  60 tablet  6  . rivaroxaban (XARELTO) 20 MG TABS tablet Take 1 tablet (20 mg total) by mouth daily with supper.  30 tablet  12  . spironolactone (ALDACTONE) 25 MG tablet Take 1 tablet (25 mg total) by mouth daily.  30 tablet  6   No current facility-administered medications for this visit.    No Known Allergies  Review of Systems negative except from HPI and PMH  Physical Exam BP 162/74  Pulse 53  Ht '6\' 2"'  (1.88 m)  Wt 214 lb 12.8 oz (97.433 kg)  BMI 27.57 kg/m2 Well developed and well nourished in no acute distress HENT normal E scleral and icterus clear Neck Supple JVP flat; carotids brisk and full Clear to ausculation  Regular rate and rhythm, 2/6 early sys murmur preserved S2 Soft with active bowel sounds No clubbing cyanosis none Edema Alert and oriented, grossly normal motor and sensory function Skin Warm and Dry  ECG demonstrates sinus rhythm at 49 Intervals 20/13/40 Poor R-wave progression Left ventricular hypertrophy with repolarization abnormalities  Assessment and  Plan  Ischemic cardio myopathy  Congestive heart failure-class 1 currently  Left bundle branch block-rate related  Atrial fibrillation conitnue Rivaroxaban   Hypertension   He is an appropriate candidate for ICD implantation. We discussed S. ICD versus transvenous ICD. He would like to pursue the former. He will get screening. We have discussed procedure risks and benefits.  Given his functional status and he is relatively narrow QRS, we will not pursue CRT-D.  We will follow his blood pressure. Mostly he says at home in his lower.

## 2013-11-10 NOTE — CV Procedure (Signed)
MICIAH LEICHTMAN [id number]  [id number]  Preop PO:EUMPNTIR cardiomyopathy Postop Dx same/   Procedure:Subcutaneous ICD w DFT Testing  Cx: None  EBL: Minimal    Dictation number [id number]  Sherryl Manges, MD 11/10/2013 12:26 PM

## 2013-11-10 NOTE — Anesthesia Procedure Notes (Signed)
Procedure Name: Intubation Date/Time: 11/10/2013 10:47 AM Performed by: Jerilee Hoh Pre-anesthesia Checklist: Patient identified, Emergency Drugs available, Patient being monitored and Suction available Patient Re-evaluated:Patient Re-evaluated prior to inductionOxygen Delivery Method: Circle system utilized Preoxygenation: Pre-oxygenation with 100% oxygen Intubation Type: IV induction Ventilation: Mask ventilation without difficulty Laryngoscope Size: Mac and 4 Grade View: Grade I Tube type: Oral Tube size: 7.5 mm Number of attempts: 1 Airway Equipment and Method: Stylet Placement Confirmation: ETT inserted through vocal cords under direct vision,  positive ETCO2 and breath sounds checked- equal and bilateral Secured at: 22 cm Tube secured with: Tape Dental Injury: Teeth and Oropharynx as per pre-operative assessment

## 2013-11-17 MED ORDER — CEFAZOLIN SODIUM-DEXTROSE 2-3 GM-% IV SOLR
INTRAVENOUS | Status: DC | PRN
  Administered 2013-11-10: 2 g via INTRAVENOUS

## 2013-11-17 NOTE — Addendum Note (Signed)
Addendum  created 11/17/13 0926 by Jerilee Hoh, CRNA   Modules edited: Anesthesia Medication Administration

## 2013-11-18 ENCOUNTER — Other Ambulatory Visit: Payer: Commercial Managed Care - HMO

## 2013-11-21 ENCOUNTER — Ambulatory Visit (INDEPENDENT_AMBULATORY_CARE_PROVIDER_SITE_OTHER): Payer: Commercial Managed Care - HMO | Admitting: *Deleted

## 2013-11-21 ENCOUNTER — Ambulatory Visit: Payer: Commercial Managed Care - HMO | Admitting: Family Medicine

## 2013-11-21 DIAGNOSIS — I429 Cardiomyopathy, unspecified: Secondary | ICD-10-CM

## 2013-11-21 DIAGNOSIS — I255 Ischemic cardiomyopathy: Secondary | ICD-10-CM

## 2013-11-21 LAB — MDC_IDC_ENUM_SESS_TYPE_INCLINIC: Implantable Pulse Generator Serial Number: 108164

## 2013-11-21 NOTE — Progress Notes (Signed)
Wound check appointment, SICD.  Wound without redness or edema. Incision edges approximated, wound well healed. Normal device function. Impedance within normal range. No treated or untreated episodes.  Battery 100%.  ROV in January with Dr. Graciela Husbands.  Checked by [employer].

## 2013-11-26 ENCOUNTER — Other Ambulatory Visit: Payer: Self-pay | Admitting: Family Medicine

## 2013-11-28 NOTE — Telephone Encounter (Signed)
Please advise refill? I don't see where Dr Abner Greenspan has gave patient this RX

## 2013-11-29 ENCOUNTER — Ambulatory Visit (INDEPENDENT_AMBULATORY_CARE_PROVIDER_SITE_OTHER): Payer: Commercial Managed Care - HMO | Admitting: *Deleted

## 2013-11-29 ENCOUNTER — Other Ambulatory Visit (INDEPENDENT_AMBULATORY_CARE_PROVIDER_SITE_OTHER): Payer: Commercial Managed Care - HMO

## 2013-11-29 ENCOUNTER — Telehealth: Payer: Self-pay | Admitting: Internal Medicine

## 2013-11-29 DIAGNOSIS — I429 Cardiomyopathy, unspecified: Secondary | ICD-10-CM

## 2013-11-29 DIAGNOSIS — Z0000 Encounter for general adult medical examination without abnormal findings: Secondary | ICD-10-CM

## 2013-11-29 LAB — HEPATIC FUNCTION PANEL
ALT: 45 U/L (ref 0–53)
AST: 37 U/L (ref 0–37)
Albumin: 4.2 g/dL (ref 3.5–5.2)
Alkaline Phosphatase: 67 U/L (ref 39–117)
BILIRUBIN DIRECT: 0.1 mg/dL (ref 0.0–0.3)
BILIRUBIN TOTAL: 0.5 mg/dL (ref 0.2–1.2)
TOTAL PROTEIN: 7.2 g/dL (ref 6.0–8.3)

## 2013-11-29 LAB — RENAL FUNCTION PANEL
Albumin: 4.2 g/dL (ref 3.5–5.2)
BUN: 20 mg/dL (ref 6–23)
CO2: 17 mEq/L — ABNORMAL LOW (ref 19–32)
CREATININE: 1 mg/dL (ref 0.4–1.5)
Calcium: 9.5 mg/dL (ref 8.4–10.5)
Chloride: 111 mEq/L (ref 96–112)
GFR: 82.41 mL/min (ref >60.00)
Glucose, Bld: 140 mg/dL — ABNORMAL HIGH (ref 70–99)
PHOSPHORUS: 4.1 mg/dL (ref 2.3–4.6)
Potassium: 4.6 mEq/L (ref 3.5–5.1)
Sodium: 140 mEq/L (ref 135–145)

## 2013-11-29 LAB — LIPID PANEL
CHOL/HDL RATIO: 3
Cholesterol: 131 mg/dL (ref 0–200)
HDL: 40.5 mg/dL (ref >39.00)
LDL CALC: 53 mg/dL (ref 0–99)
NONHDL: 90.5
Triglycerides: 190 mg/dL — ABNORMAL HIGH (ref 0.0–149.0)
VLDL: 38 mg/dL (ref 0.0–40.0)

## 2013-11-29 LAB — CBC
HCT: 40 % (ref 39.0–52.0)
HEMOGLOBIN: 13.5 g/dL (ref 13.0–17.0)
MCHC: 33.8 g/dL (ref 30.0–36.0)
MCV: 88.2 fl (ref 78.0–100.0)
Platelets: 175 10*3/uL (ref 150.0–400.0)
RBC: 4.53 Mil/uL (ref 4.22–5.81)
RDW: 14.9 % (ref 11.5–15.5)
WBC: 7 10*3/uL (ref 4.0–10.5)

## 2013-11-29 LAB — TSH: TSH: 0.45 u[IU]/mL (ref 0.35–4.50)

## 2013-11-29 LAB — HEMOGLOBIN A1C: Hgb A1c MFr Bld: 6.5 % (ref 4.6–6.5)

## 2013-11-29 NOTE — Telephone Encounter (Signed)
New problem   Pt has incision swelling and stinking pain. Please call pt.

## 2013-11-29 NOTE — Telephone Encounter (Signed)
Patient calls in and tells Melissa, EP scheduler, it is ok to speak with his wife.

## 2013-11-29 NOTE — Telephone Encounter (Signed)
No dpr on file. Informed wife that I would need verbal confirmation from patient to speak with her. She stated that she will have him call me back.

## 2013-11-29 NOTE — Telephone Encounter (Signed)
Patients wife calls in (after husband approval given) and explains patient has been fine until yesterday. His incision site is size of an orange and feels hard. Patient describes it as a pain that hurts internally. He is using arm sling which makes area feel better. Patient scheduled to see device clinic this afternoon at 3pm. Patient's wife is agreeable to plan.

## 2013-11-29 NOTE — Progress Notes (Signed)
Pt seen in device clinic for swelling at wound site. Pt implanted with S-ICD x 3 weeks ago. Over last 2 days, site became swollen more than usual. Dr Ladona Ridgel evaluated site. Per GT stop Xarelto and SK to evaluate on 12-01-13. Pt scheduled for 12-01-13 @ 900 for wound check only with SK.

## 2013-12-01 ENCOUNTER — Encounter: Payer: Self-pay | Admitting: Internal Medicine

## 2013-12-01 ENCOUNTER — Ambulatory Visit (INDEPENDENT_AMBULATORY_CARE_PROVIDER_SITE_OTHER): Payer: Commercial Managed Care - HMO | Admitting: Internal Medicine

## 2013-12-01 ENCOUNTER — Telehealth: Payer: Self-pay | Admitting: Internal Medicine

## 2013-12-01 VITALS — BP 140/68 | HR 65 | Ht 74.0 in | Wt 217.0 lb

## 2013-12-01 DIAGNOSIS — T82837A Hemorrhage of cardiac prosthetic devices, implants and grafts, initial encounter: Secondary | ICD-10-CM

## 2013-12-01 NOTE — Telephone Encounter (Signed)
Informed patient that we are not restarting Xarelto at this time.  Explained that Dr. Graciela Husbands will review chart to determine if need to restart or not. Will review at next office visit next Tuesday. Patient is agreeable to plan.

## 2013-12-01 NOTE — Progress Notes (Signed)
Patient Care Team: Mosie Lukes, MD as PCP - General (Family Medicine)   HPI  Larry Rangel is a 63 y.o. male Seen in followup for SiCD implantation Ah e was seen post implant on 11/9 look good. Unfortunately something happens between and 1117 developed a significant hematoma. His Rivaroxaban was discontinued    Past Medical History  Diagnosis Date  . Anxiety   . Hypertension   . Low back pain   . Tobacco user   . CAD (coronary artery disease)     a. s/p CABG 2009. b. Cath 07/2013: 3/5 patent grafts (appear to have chronic occ grafts), mod diag/L PL branch, did not appear flow limiting, elevated filling pressures.  . Hyperlipidemia   . S/P colonoscopy   . Myocardial infarction   . Arthritis   . Paroxysmal atrial flutter 07-2012    a. s/p ablation by Dr [name] 08-06-2012. b. Recurrence 07/2013.   Marland Kitchen Chicken pox as a child  . Measles as a child  . Mumps as a child  . Abnormal liver function 08/23/2010  . Chronic combined systolic and diastolic CHF (congestive heart failure)     a. Echo 6/14: Mild LVH, EF 30-35%, diffuse HK, MAC, mild BAE. b. Drop in EF to 20% by echo 07/2013.  . Diabetes mellitus   . PAF (paroxysmal atrial fibrillation)   . LBBB (left bundle branch block)     Past Surgical History  Procedure Laterality Date  . Shoulder surgery    . Coronary artery bypass graft  2009    x 5  . Tee without cardioversion N/A 07/15/2012    Procedure: TRANSESOPHAGEAL ECHOCARDIOGRAM (TEE);  Surgeon: Thayer Headings, MD;  Location: [facility];  Service: Cardiovascular;  Laterality: N/A;  . Cardioversion N/A 07/15/2012    Procedure: CARDIOVERSION;  Surgeon: Thayer Headings, MD;  Location: [place] [facility];  Service: Cardiovascular;  Laterality: N/A;  . Ablation of dysrhythmic focus  08/06/2012    CTI ablation by Dr [name]  . Tonsillectomy    . Cardiac catheterization      Current Outpatient Prescriptions  Medication Sig Dispense Refill  . ACCU-CHEK FASTCLIX LANCETS MISC      . ACCU-CHEK SMARTVIEW test strip     . atorvastatin (LIPITOR) 20 MG tablet Take 1 tablet (20 mg total) by mouth daily. 90 tablet 1  . Blood Glucose Monitoring Suppl (ACCU-CHEK NANO SMARTVIEW) W/DEVICE KIT     . busPIRone (BUSPAR) 10 MG tablet Take 10 mg by mouth 2 (two) times daily.    . carvedilol (COREG) 12.5 MG tablet Take 1 tablet (12.5 mg total) by mouth 2 (two) times daily with a meal. 60 tablet 12  . clonazePAM (KLONOPIN) 0.5 MG tablet TAKE 1 TABLET BY MOUTH 3 TIMES A DAY AS NEEDED FOR ANXIETY 90 tablet 0  . cyclobenzaprine (FLEXERIL) 10 MG tablet Take 10 mg by mouth 3 (three) times daily as needed for muscle spasms.    . furosemide (LASIX) 40 MG tablet Take 40 mg by mouth daily.     Marland Kitchen glipiZIDE (GLUCOTROL) 2.5 mg TABS tablet Take 0.5 tablets (2.5 mg total) by mouth daily before breakfast. 30 tablet 12  . Hydrocodone-Acetaminophen (VICODIN) 5-300 MG TABS Take 1 tablet by mouth every 6 (six) hours as needed. 10 each 0  . irbesartan (AVAPRO) 300 MG tablet Take 1 tablet (300 mg total) by mouth daily. 30 tablet 12  . metFORMIN (GLUCOPHAGE) 500 MG tablet Take 1 tablet (500 mg total) by  mouth 2 (two) times daily with a meal. 60 tablet 6  . rivaroxaban (XARELTO) 20 MG TABS tablet Take 1 tablet (20 mg total) by mouth daily with supper. 30 tablet 12  . spironolactone (ALDACTONE) 25 MG tablet Take 1 tablet (25 mg total) by mouth daily. 30 tablet 6   No current facility-administered medications for this visit.    No Known Allergies  Review of Systems negative except from HPI and PMH  Physical Exam BP 140/68 mmHg  Pulse 65  Ht '6\' 2"'  (1.88 m)  Wt 98.431 kg (217 lb)  BMI 27.85 kg/m2  Large hematoma in the posterior superiorrelated to his incision.    Assessment and  Plan  Device pocket hematoma. Elastoplast dressing was applied. We'll continue to hold his anticoagulants. I will see him next week.

## 2013-12-01 NOTE — Patient Instructions (Signed)
Your physician recommends that you continue on your current medications as directed. Please refer to the Current Medication list given to you today.  Your physician recommends that you schedule a follow-up appointment on Tuesday 11/24 at 3:30 pm`

## 2013-12-01 NOTE — Telephone Encounter (Signed)
New Message\  Pt states that he was advised previously to stop taking xarelto. Pt request a call back to determine if he should resume taking the medication. Please call

## 2013-12-02 ENCOUNTER — Encounter: Payer: Self-pay | Admitting: Family Medicine

## 2013-12-02 ENCOUNTER — Ambulatory Visit (INDEPENDENT_AMBULATORY_CARE_PROVIDER_SITE_OTHER): Payer: Commercial Managed Care - HMO | Admitting: Family Medicine

## 2013-12-02 VITALS — BP 135/58 | HR 68 | Temp 98.0°F | Wt 216.4 lb

## 2013-12-02 DIAGNOSIS — E785 Hyperlipidemia, unspecified: Secondary | ICD-10-CM

## 2013-12-02 DIAGNOSIS — E119 Type 2 diabetes mellitus without complications: Secondary | ICD-10-CM

## 2013-12-02 DIAGNOSIS — I10 Essential (primary) hypertension: Secondary | ICD-10-CM

## 2013-12-02 DIAGNOSIS — E669 Obesity, unspecified: Secondary | ICD-10-CM

## 2013-12-02 DIAGNOSIS — E1169 Type 2 diabetes mellitus with other specified complication: Secondary | ICD-10-CM

## 2013-12-02 DIAGNOSIS — I447 Left bundle-branch block, unspecified: Secondary | ICD-10-CM

## 2013-12-02 NOTE — Patient Instructions (Signed)

## 2013-12-02 NOTE — Progress Notes (Signed)
Pre visit review using our clinic review tool, if applicable. No additional management support is needed unless otherwise documented below in the visit note. 

## 2013-12-03 ENCOUNTER — Other Ambulatory Visit: Payer: Self-pay | Admitting: Family Medicine

## 2013-12-05 NOTE — Telephone Encounter (Signed)
Medication Detail      Disp Refills Start End     clonazePAM (KLONOPIN) 0.5 MG tablet 90 tablet 0 11/28/2013     Sig: TAKE 1 TABLET BY MOUTH 3 TIMES A DAY AS NEEDED FOR ANXIETY    Class: Print    Notes to Pharmacy: Not to exceed 5 additional fills before 04/18/2014     Pharmacy    [id number] [place], [place] - [address]   Requested Rx to pharmacy on 11.16.15, #[id number], Denied--Too Soon for request/SLS

## 2013-12-06 ENCOUNTER — Encounter: Payer: Self-pay | Admitting: Internal Medicine

## 2013-12-06 ENCOUNTER — Ambulatory Visit (INDEPENDENT_AMBULATORY_CARE_PROVIDER_SITE_OTHER): Payer: Commercial Managed Care - HMO | Admitting: Internal Medicine

## 2013-12-06 VITALS — BP 136/62 | HR 65 | Ht 74.0 in | Wt 217.4 lb

## 2013-12-06 DIAGNOSIS — T82837D Hemorrhage of cardiac prosthetic devices, implants and grafts, subsequent encounter: Secondary | ICD-10-CM

## 2013-12-06 NOTE — Patient Instructions (Signed)
Your physician recommends that you continue on your current medications as directed. Please refer to the Current Medication list given to you today.  Your physician recommends that you schedule a follow-up appointment in: Tuesday 12/01 at 3:30 pm with Dr. Graciela Husbands for wound check

## 2013-12-06 NOTE — Progress Notes (Signed)
Patient Care Team: Mosie Lukes, MD as PCP - General (Family Medicine)   HPI  Larry Rangel is a 63 y.o. male Seen in follow-up for hematoma occurring in the context of a subcutaneous ICD implantation. This is associated with resumption of anticoagulation and then some external stresses. We put a pressure dressing on last week. He comes in today for reevaluation  His a history of atrial flutter ablation and recurrence of atrial fibrillation.     Past Medical History  Diagnosis Date  . Anxiety   . Hypertension   . Low back pain   . Tobacco user   . CAD (coronary artery disease)     a. s/p CABG 2009. b. Cath 07/2013: 3/5 patent grafts (appear to have chronic occ grafts), mod diag/L PL branch, did not appear flow limiting, elevated filling pressures.  . Hyperlipidemia   . S/P colonoscopy   . Myocardial infarction   . Arthritis   . Paroxysmal atrial flutter 07-2012    a. s/p ablation by Dr [name] 08-06-2012. b. Recurrence 07/2013.   Marland Kitchen Chicken pox as a child  . Measles as a child  . Mumps as a child  . Abnormal liver function 08/23/2010  . Chronic combined systolic and diastolic CHF (congestive heart failure)     a. Echo 6/14: Mild LVH, EF 30-35%, diffuse HK, MAC, mild BAE. b. Drop in EF to 20% by echo 07/2013.  . Diabetes mellitus   . PAF (paroxysmal atrial fibrillation)   . LBBB (left bundle branch block)     Past Surgical History  Procedure Laterality Date  . Shoulder surgery    . Coronary artery bypass graft  2009    x 5  . Tee without cardioversion N/A 07/15/2012    Procedure: TRANSESOPHAGEAL ECHOCARDIOGRAM (TEE);  Surgeon: Thayer Headings, MD;  Location: [facility];  Service: Cardiovascular;  Laterality: N/A;  . Cardioversion N/A 07/15/2012    Procedure: CARDIOVERSION;  Surgeon: Thayer Headings, MD;  Location: [facility];  Service: Cardiovascular;  Laterality: N/A;  . Ablation of dysrhythmic focus  08/06/2012    CTI ablation by Dr [name]  . Tonsillectomy      . Cardiac catheterization      Current Outpatient Prescriptions  Medication Sig Dispense Refill  . ACCU-CHEK FASTCLIX LANCETS MISC     . ACCU-CHEK SMARTVIEW test strip     . atorvastatin (LIPITOR) 20 MG tablet Take 1 tablet (20 mg total) by mouth daily. 90 tablet 1  . Blood Glucose Monitoring Suppl (ACCU-CHEK NANO SMARTVIEW) W/DEVICE KIT     . busPIRone (BUSPAR) 10 MG tablet Take 10 mg by mouth 2 (two) times daily.    . carvedilol (COREG) 12.5 MG tablet Take 1 tablet (12.5 mg total) by mouth 2 (two) times daily with a meal. 60 tablet 12  . clonazePAM (KLONOPIN) 0.5 MG tablet TAKE 1 TABLET BY MOUTH 3 TIMES A DAY AS NEEDED FOR ANXIETY 90 tablet 0  . cyclobenzaprine (FLEXERIL) 10 MG tablet Take 10 mg by mouth 3 (three) times daily as needed for muscle spasms.    . furosemide (LASIX) 40 MG tablet Take 40 mg by mouth daily.     Marland Kitchen glipiZIDE (GLUCOTROL) 2.5 mg TABS tablet Take 0.5 tablets (2.5 mg total) by mouth daily before breakfast. 30 tablet 12  . Hydrocodone-Acetaminophen (VICODIN) 5-300 MG TABS Take 1 tablet by mouth every 6 (six) hours as needed. 10 each 0  . irbesartan (AVAPRO) 300 MG tablet Take  1 tablet (300 mg total) by mouth daily. 30 tablet 12  . metFORMIN (GLUCOPHAGE) 500 MG tablet Take 1 tablet (500 mg total) by mouth 2 (two) times daily with a meal. 60 tablet 6  . rivaroxaban (XARELTO) 20 MG TABS tablet Take 1 tablet (20 mg total) by mouth daily with supper. 30 tablet 12  . spironolactone (ALDACTONE) 25 MG tablet Take 1 tablet (25 mg total) by mouth daily. 30 tablet 6   No current facility-administered medications for this visit.    No Known Allergies  Review of Systems negative except from HPI and PMH  Physical Exam BP 136/62 mmHg  Pulse 65  Ht '6\' 2"'  (1.88 m)  Wt 98.612 kg (217 lb 6.4 oz)  BMI 27.90 kg/m2 That is now softer. Photographic, comparison demonstrated somewhat smaller.   Assessment and  Plan Pocket hematoma. Improving we'll recheck next week and hold off  on anticoagulation

## 2013-12-09 ENCOUNTER — Other Ambulatory Visit: Payer: Self-pay | Admitting: Family Medicine

## 2013-12-11 ENCOUNTER — Encounter: Payer: Self-pay | Admitting: Family Medicine

## 2013-12-11 NOTE — Assessment & Plan Note (Signed)
hgba1c acceptable, minimize simple carbs. Increase exercise as tolerated. Continue current meds 

## 2013-12-11 NOTE — Assessment & Plan Note (Signed)
Pacer defibrillator placed 2 weeks ago. No shocks, doing well

## 2013-12-11 NOTE — Assessment & Plan Note (Signed)
Well controlled, no changes to meds. Encouraged heart healthy diet such as the DASH diet and exercise as tolerated.  °

## 2013-12-11 NOTE — Progress Notes (Signed)
Larry Rangel  [id number] [birth year] 106-27-2015      Progress Note-Follow Up  Subjective  Chief Complaint  Chief Complaint  Patient presents with  . Follow-up    2 month    HPI  Patient is a 63 y.o. male in today for routine medical care. He is in today for follow-up. He is recently had a pacer defibrillator placed and is doing well. No shocks have been administered. He is hoping to get off of this Xarelto soon. No recent illness. No acute complaints. Denies CP/palp/SOB/HA/congestion/fevers/GI or GU c/o. Taking meds as prescribed  Past Medical History  Diagnosis Date  . Anxiety   . Hypertension   . Low back pain   . Tobacco user   . CAD (coronary artery disease)     a. s/p CABG 2009. b. Cath 07/2013: 3/5 patent grafts (appear to have chronic occ grafts), mod diag/L PL branch, did not appear flow limiting, elevated filling pressures.  . Hyperlipidemia   . S/P colonoscopy   . Myocardial infarction   . Arthritis   . Paroxysmal atrial flutter 07-2012    a. s/p ablation by Dr [name] 08-06-2012. b. Recurrence 07/2013.   Marland Kitchen Chicken pox as a child  . Measles as a child  . Mumps as a child  . Abnormal liver function 08/23/2010  . Chronic combined systolic and diastolic CHF (congestive heart failure)     a. Echo 6/14: Mild LVH, EF 30-35%, diffuse HK, MAC, mild BAE. b. Drop in EF to 20% by echo 07/2013.  . Diabetes mellitus   . PAF (paroxysmal atrial fibrillation)   . LBBB (left bundle branch block)     Past Surgical History  Procedure Laterality Date  . Shoulder surgery    . Coronary artery bypass graft  2009    x 5  . Tee without cardioversion N/A 07/15/2012    Procedure: TRANSESOPHAGEAL ECHOCARDIOGRAM (TEE);  Surgeon: Thayer Headings, MD;  Location: [facility];  Service: Cardiovascular;  Laterality: N/A;  . Cardioversion N/A 07/15/2012    Procedure: CARDIOVERSION;  Surgeon: Thayer Headings, MD;  Location: [place];  Service: Cardiovascular;  Laterality: N/A;  . Ablation  of dysrhythmic focus  08/06/2012    CTI ablation by Dr [name]  . Tonsillectomy    . Cardiac catheterization      Family History  Problem Relation Age of Onset  . Alzheimer's disease Mother   . Heart failure Father 34  . Hypertension Father   . Hyperlipidemia Father   . Cancer Father     lung- took half of a young- smoker  . Heart disease Father     MI at 34  . Early death Neg Hx   . Kidney disease Neg Hx   . Stroke Neg Hx   . Leukemia Brother   . Drug abuse Daughter     heroine    History   Social History  . Marital Status: Married    Spouse Name: N/A    Number of Children: N/A  . Years of Education: N/A   Occupational History  . Not on file.   Social History Main Topics  . Smoking status: Former Smoker -- 1.00 packs/day for 40 years    Types: Cigarettes    Quit date: 02/01/2011  . Smokeless tobacco: Never Used  . Alcohol Use: 14.4 oz/week    24 Glasses of wine per week     Comment: drinks 2 bottles of wine per week  . Drug Use: No  .  Sexual Activity: Yes   Other Topics Concern  . 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   He has been married four times previously, the first  For 36yr and has two children by the marriage which are grown, the second time for 11 yrs and has 2 by that marriage.   Wife has custody in [place]   The third marriage was 4 1/2 yrs and most recent marriage was the past eight weeks and he is currently estranged from that person.    Current Outpatient Prescriptions on File Prior to Visit  Medication Sig Dispense Refill  . ACCU-CHEK FASTCLIX LANCETS MISC     . ACCU-CHEK SMARTVIEW test strip     . atorvastatin (LIPITOR) 20 MG tablet Take 1 tablet (20 mg total) by mouth daily. 90 tablet 1  . Blood Glucose Monitoring Suppl (ACCU-CHEK NANO SMARTVIEW) W/DEVICE KIT     . busPIRone (BUSPAR) 10 MG  tablet Take 10 mg by mouth 2 (two) times daily.    . carvedilol (COREG) 12.5 MG tablet Take 1 tablet (12.5 mg total) by mouth 2 (two) times daily with a meal. 60 tablet 12  . clonazePAM (KLONOPIN) 0.5 MG tablet TAKE 1 TABLET BY MOUTH 3 TIMES A DAY AS NEEDED FOR ANXIETY 90 tablet 0  . cyclobenzaprine (FLEXERIL) 10 MG tablet Take 10 mg by mouth 3 (three) times daily as needed for muscle spasms.    . furosemide (LASIX) 40 MG tablet Take 40 mg by mouth daily.     .Marland KitchenglipiZIDE (GLUCOTROL) 2.5 mg TABS tablet Take 0.5 tablets (2.5 mg total) by mouth daily before breakfast. 30 tablet 12  . Hydrocodone-Acetaminophen (VICODIN) 5-300 MG TABS Take 1 tablet by mouth every 6 (six) hours as needed. 10 each 0  . irbesartan (AVAPRO) 300 MG tablet Take 1 tablet (300 mg total) by mouth daily. 30 tablet 12  . metFORMIN (GLUCOPHAGE) 500 MG tablet Take 1 tablet (500 mg total) by mouth 2 (two) times daily with a meal. 60 tablet 6  . rivaroxaban (XARELTO) 20 MG TABS tablet Take 1 tablet (20 mg total) by mouth daily with supper. 30 tablet 12  . spironolactone (ALDACTONE) 25 MG tablet Take 1 tablet (25 mg total) by mouth daily. 30 tablet 6   No current facility-administered medications on file prior to visit.    No Known Allergies  Review of Systems  Review of Systems  Constitutional: Negative for fever and malaise/fatigue.  HENT: Negative for congestion.   Eyes: Negative for discharge.  Respiratory: Negative for shortness of breath.   Cardiovascular: Negative for chest pain, palpitations and leg swelling.  Gastrointestinal: Negative for nausea, abdominal pain and diarrhea.  Genitourinary: Negative for dysuria.  Musculoskeletal: Negative for falls.  Skin: Negative for rash.  Neurological: Negative for loss of consciousness and headaches.  Endo/Heme/Allergies: Negative for polydipsia.  Psychiatric/Behavioral: Negative for depression and suicidal ideas. The patient is not nervous/anxious and does not have  insomnia.     Objective  BP 135/58 mmHg  Pulse 68  Temp(Src) 98 F (36.7 C) (Oral)  Wt 216 lb 6.4 oz (98.158 kg)  SpO2 99%  Physical Exam  Physical Exam  Constitutional: He is oriented to person, place, and time and well-developed, well-nourished, and in no distress. No distress.  HENT:  Head: Normocephalic and atraumatic.  Eyes: Conjunctivae are normal.  Neck: Neck supple. No thyromegaly present.  Cardiovascular: Normal rate, regular rhythm and normal heart sounds.   No murmur heard. Pacer palpable in left upper anterior CW  Pulmonary/Chest: Effort normal and breath sounds normal. No respiratory distress.  Abdominal: He exhibits no distension and no mass. There is no tenderness.  Musculoskeletal: He exhibits no edema.  Neurological: He is alert and oriented to person, place, and time.  Skin: Skin is warm.  Psychiatric: Memory, affect and judgment normal.    Lab Results  Component Value Date   TSH 0.45 11/29/2013   Lab Results  Component Value Date   WBC 7.0 11/29/2013   HGB 13.5 11/29/2013   HCT 40.0 11/29/2013   MCV 88.2 11/29/2013   PLT 175.0 11/29/2013   Lab Results  Component Value Date   CREATININE 1.0 11/29/2013   BUN 20 11/29/2013   NA 140 11/29/2013   K 4.6 11/29/2013   CL 111 11/29/2013   CO2 17* 11/29/2013   Lab Results  Component Value Date   ALT 45 11/29/2013   AST 37 11/29/2013   ALKPHOS 67 11/29/2013   BILITOT 0.5 11/29/2013   Lab Results  Component Value Date   CHOL 131 11/29/2013   Lab Results  Component Value Date   HDL 40.50 11/29/2013   Lab Results  Component Value Date   LDLCALC 53 11/29/2013   Lab Results  Component Value Date   TRIG 190.0* 11/29/2013   Lab Results  Component Value Date   CHOLHDL 3 11/29/2013     Assessment & Plan  Essential hypertension Well controlled, no changes to meds. Encouraged heart healthy diet such as the DASH diet and exercise as tolerated.   LBBB (left bundle branch block) Pacer  defibrillator placed 2 weeks ago. No shocks, doing well  Diabetes mellitus type 2 in obese [id number] acceptable, minimize simple carbs. Increase exercise as tolerated. Continue current meds  Hyperlipidemia Tolerating statin, encouraged heart healthy diet, avoid trans fats, minimize simple carbs and saturated fats. Increase exercise as tolerated

## 2013-12-11 NOTE — Assessment & Plan Note (Signed)
Tolerating statin, encouraged heart healthy diet, avoid trans fats, minimize simple carbs and saturated fats. Increase exercise as tolerated 

## 2013-12-13 ENCOUNTER — Other Ambulatory Visit: Payer: Self-pay

## 2013-12-13 ENCOUNTER — Ambulatory Visit (INDEPENDENT_AMBULATORY_CARE_PROVIDER_SITE_OTHER): Payer: Commercial Managed Care - HMO | Admitting: Internal Medicine

## 2013-12-13 ENCOUNTER — Other Ambulatory Visit: Payer: Self-pay | Admitting: Family Medicine

## 2013-12-13 VITALS — BP 144/80 | HR 73 | Ht 74.0 in | Wt 211.0 lb

## 2013-12-13 DIAGNOSIS — T82837D Hemorrhage of cardiac prosthetic devices, implants and grafts, subsequent encounter: Secondary | ICD-10-CM

## 2013-12-13 NOTE — Progress Notes (Signed)
Patient Care Team: Mosie Lukes, MD as PCP - General (Family Medicine)   HPI  Larry Rangel is a 63 y.o. male Seen in follow-up for hematoma occurring in the context of a subcutaneous ICD. It occurred 1-2 weeks following device implantation with resumption of anticoagulation. He was initially treated with a pressure dressing 1 week. Interval photographs suggested significant improvement. At that time the pocket hematoma was relatively soft. He comes in today for follow-up. The pocket hematoma is greater. There is no tenderness.   During hospitalization 7/15 EF was noted to be 20%. He had recurrences of atrial fibrillation.he underwent cath showing:  1. Severe triple vessel CAD s/p 5V CABG with 3/5 patent bypass grafts. The graft to the left sided PL is occluded and the graft to the diagonal is occluded (both appear to be chronic occlusions)  2. Moderate disease in diagonal and left sided posterolateral branch, does not appear to be flow limiting.  3. Elevated filling pressures  ECG demonstrated left bundle branch block with a QRS duration of 150. Repeat ECG shows a QRS duration of 130 milliseconds and a more nonspecific pattern  Past Medical History  Diagnosis Date  . Anxiety   . Hypertension   . Low back pain   . Tobacco user   . CAD (coronary artery disease)     a. s/p CABG 2009. b. Cath 07/2013: 3/5 patent grafts (appear to have chronic occ grafts), mod diag/L PL branch, did not appear flow limiting, elevated filling pressures.  . Hyperlipidemia   . S/P colonoscopy   . Myocardial infarction   . Arthritis   . Paroxysmal atrial flutter 07-2012    a. s/p ablation by Dr [name] 08-06-2012. b. Recurrence 07/2013.   Marland Kitchen Chicken pox as a child  . Measles as a child  . Mumps as a child  . Abnormal liver function 08/23/2010  . Chronic combined systolic and diastolic CHF (congestive heart failure)     a. Echo 6/14: Mild LVH, EF 30-35%, diffuse HK, MAC, mild BAE. b. Drop in EF  to 20% by echo 07/2013.  . Diabetes mellitus   . PAF (paroxysmal atrial fibrillation)   . LBBB (left bundle branch block)     Past Surgical History  Procedure Laterality Date  . Shoulder surgery    . Coronary artery bypass graft  2009    x 5  . Tee without cardioversion N/A 07/15/2012    Procedure: TRANSESOPHAGEAL ECHOCARDIOGRAM (TEE);  Surgeon: Thayer Headings, MD;  Location: [facility];  Service: Cardiovascular;  Laterality: N/A;  . Cardioversion N/A 07/15/2012    Procedure: CARDIOVERSION;  Surgeon: Thayer Headings, MD;  Location: [facility];  Service: Cardiovascular;  Laterality: N/A;  . Ablation of dysrhythmic focus  08/06/2012    CTI ablation by Dr [name]  . Tonsillectomy    . Cardiac catheterization      Current Outpatient Prescriptions  Medication Sig Dispense Refill  . ACCU-CHEK FASTCLIX LANCETS MISC     . ACCU-CHEK SMARTVIEW test strip     . atorvastatin (LIPITOR) 20 MG tablet Take 1 tablet (20 mg total) by mouth daily. 90 tablet 1  . Blood Glucose Monitoring Suppl (ACCU-CHEK NANO SMARTVIEW) W/DEVICE KIT     . busPIRone (BUSPAR) 10 MG tablet Take 10 mg by mouth 2 (two) times daily.    . carvedilol (COREG) 12.5 MG tablet Take 1 tablet (12.5 mg total) by mouth 2 (two) times daily with a meal. 60 tablet 12  .  clonazePAM (KLONOPIN) 0.5 MG tablet TAKE 1 TABLET BY MOUTH 3 TIMES A DAY AS NEEDED FOR ANXIETY 90 tablet 0  . cyclobenzaprine (FLEXERIL) 10 MG tablet Take 10 mg by mouth 3 (three) times daily as needed for muscle spasms.    . furosemide (LASIX) 40 MG tablet Take 40 mg by mouth daily.     Marland Kitchen glipiZIDE (GLUCOTROL) 2.5 mg TABS tablet Take 0.5 tablets (2.5 mg total) by mouth daily before breakfast. 30 tablet 12  . Hydrocodone-Acetaminophen (VICODIN) 5-300 MG TABS Take 1 tablet by mouth every 6 (six) hours as needed. 10 each 0  . irbesartan (AVAPRO) 300 MG tablet Take 1 tablet (300 mg total) by mouth daily. 30 tablet 12  . metFORMIN (GLUCOPHAGE) 500 MG tablet Take 1 tablet (500  mg total) by mouth 2 (two) times daily with a meal. 60 tablet 6  . rivaroxaban (XARELTO) 20 MG TABS tablet Take 1 tablet (20 mg total) by mouth daily with supper. 30 tablet 12  . spironolactone (ALDACTONE) 25 MG tablet Take 1 tablet (25 mg total) by mouth daily. 30 tablet 6   No current facility-administered medications for this visit.    No Known Allergies  Review of Systems negative except from HPI and PMH  Physical Exam BP 144/80 mmHg  Pulse 73  Ht '6\' 2"'  (1.88 m)  Wt 211 lb (95.709 kg)  BMI 27.08 kg/m2 Well developed and well nourished in no acute distress HENT normal E scleral and icterus clear Neck Supple JVP flat; carotids brisk and full Clear to ausculation  Pocket hematoma with increasing firmness and size Regular rate and rhythm, no murmurs gallops or rub Soft with active bowel sounds No clubbing cyanosis  Edema Alert and oriented, grossly normal motor and sensory function Skin Warm and Dry    Assessment and  Plan  Subcutaneous ICD hematoma  Atrial fibrillation associated with Rivaroxaban the resumption of which was temporally associated with the above  Ischemic cardio myopathy with prior bypass and depressed LV function EF 25%  The patient has a hematoma that initially improved with a pressure dressing but has now worsened. I have reviewed this with Dr. Lovena Le. Evacuation is indicated. He will be submitted for this at the first available date which is Friday.

## 2013-12-13 NOTE — Telephone Encounter (Signed)
Larry Rangel Spouse [id number] [facility]  Larey Brick called to say that [facility] said we were not responding to their faxes for a refill on clonazePAM (KLONOPIN) 0.5 MG tablet, I told her we had printed the prescription on 11/28/13

## 2013-12-13 NOTE — Telephone Encounter (Signed)
So are they going to refill it off of what you told them? It was faxed to them on 11-28-13 quantity 90 with 0 refills

## 2013-12-13 NOTE — Patient Instructions (Signed)
Your physician recommends that you continue on your current medications as directed. Please refer to the Current Medication list given to you today.  Please arrive at hospital on Friday 11/4 at 12:00 for pocket revision with Dr. Ladona Ridgel. Pre procedure labs to be done in the hospital.

## 2013-12-14 ENCOUNTER — Telehealth: Payer: Self-pay | Admitting: Internal Medicine

## 2013-12-14 ENCOUNTER — Encounter: Payer: Self-pay | Admitting: Internal Medicine

## 2013-12-14 LAB — MDC_IDC_ENUM_SESS_TYPE_INCLINIC: Implantable Pulse Generator Serial Number: 108164

## 2013-12-14 MED ORDER — CLONAZEPAM 0.5 MG PO TABS: ORAL_TABLET | ORAL | Status: DC

## 2013-12-14 NOTE — Addendum Note (Signed)
Addended by: Eustace Quail on: 12/14/2013 11:09 AM   Modules accepted: Orders

## 2013-12-14 NOTE — Telephone Encounter (Signed)
Patient wanted to know if he could have coffee before procedure on Friday with Dr. Ladona Ridgel. Advised ok to have cup of coffee before 6am morning of procedure. Reviewed instructions for procedure: NPO after MN, No Lasix/Metformin/Glipizide morning of procedure, may take other meds with small amount of water, wash chest night before/morning of. Wound check scheduled for 12/16 at 4:00 pm. Patient verbalized understanding and agreeable to plan.

## 2013-12-14 NOTE — Telephone Encounter (Signed)
New problem    Pt need to speak to you concerning an appt you scheduled for him yesterday for pocket revision. Please call pt.

## 2013-12-14 NOTE — Telephone Encounter (Signed)
Spoke with pts pharmacy stated that they never received order or fax in November last picked up in September . Called in new RX for clonazepam .

## 2013-12-14 NOTE — Telephone Encounter (Signed)
[facility] that we would need to fax again.

## 2013-12-14 NOTE — Telephone Encounter (Signed)
Phoned in rx . Done

## 2013-12-14 NOTE — Telephone Encounter (Signed)
Patient states that they never did pick up the rx from 11/16 and the pharmacy has told them that they never received a fax on 11/16 and now patient is out of med.

## 2013-12-15 ENCOUNTER — Encounter (HOSPITAL_COMMUNITY): Payer: Self-pay | Admitting: Pharmacy Technician

## 2013-12-15 DIAGNOSIS — Z720 Tobacco use: Secondary | ICD-10-CM | POA: Diagnosis not present

## 2013-12-15 DIAGNOSIS — I2510 Atherosclerotic heart disease of native coronary artery without angina pectoris: Secondary | ICD-10-CM | POA: Diagnosis not present

## 2013-12-15 DIAGNOSIS — I447 Left bundle-branch block, unspecified: Secondary | ICD-10-CM | POA: Diagnosis not present

## 2013-12-15 DIAGNOSIS — Z951 Presence of aortocoronary bypass graft: Secondary | ICD-10-CM | POA: Diagnosis not present

## 2013-12-15 DIAGNOSIS — F419 Anxiety disorder, unspecified: Secondary | ICD-10-CM | POA: Diagnosis not present

## 2013-12-15 DIAGNOSIS — E119 Type 2 diabetes mellitus without complications: Secondary | ICD-10-CM | POA: Diagnosis not present

## 2013-12-15 DIAGNOSIS — I10 Essential (primary) hypertension: Secondary | ICD-10-CM | POA: Diagnosis not present

## 2013-12-15 DIAGNOSIS — E785 Hyperlipidemia, unspecified: Secondary | ICD-10-CM | POA: Diagnosis not present

## 2013-12-15 DIAGNOSIS — I255 Ischemic cardiomyopathy: Secondary | ICD-10-CM | POA: Diagnosis not present

## 2013-12-15 DIAGNOSIS — M1990 Unspecified osteoarthritis, unspecified site: Secondary | ICD-10-CM | POA: Diagnosis not present

## 2013-12-15 DIAGNOSIS — I4891 Unspecified atrial fibrillation: Secondary | ICD-10-CM | POA: Diagnosis not present

## 2013-12-15 DIAGNOSIS — I5042 Chronic combined systolic (congestive) and diastolic (congestive) heart failure: Secondary | ICD-10-CM | POA: Diagnosis not present

## 2013-12-15 DIAGNOSIS — I252 Old myocardial infarction: Secondary | ICD-10-CM | POA: Diagnosis not present

## 2013-12-15 MED ORDER — SODIUM CHLORIDE 0.9 % IR SOLN
80.0000 mg | Status: AC
  Filled 2013-12-15: qty 2

## 2013-12-15 MED ORDER — CEFAZOLIN SODIUM-DEXTROSE 2-3 GM-% IV SOLR: 2.0000 g | INTRAVENOUS | Status: AC

## 2013-12-16 ENCOUNTER — Encounter (HOSPITAL_COMMUNITY)
Admission: RE | Disposition: A | Discharge status: Home / Self Care | Payer: Self-pay | Source: Ambulatory Visit | Attending: Internal Medicine

## 2013-12-16 ENCOUNTER — Ambulatory Visit (HOSPITAL_COMMUNITY)
Admission: RE | Admit: 2013-12-16 | Discharge: 2013-12-16 | Disposition: A | Discharge status: Home / Self Care | Payer: Medicare HMO | Source: Ambulatory Visit | Attending: Internal Medicine | Admitting: Internal Medicine

## 2013-12-16 DIAGNOSIS — T82837A Hemorrhage of cardiac prosthetic devices, implants and grafts, initial encounter: Secondary | ICD-10-CM | POA: Diagnosis present

## 2013-12-16 DIAGNOSIS — I4891 Unspecified atrial fibrillation: Secondary | ICD-10-CM | POA: Diagnosis not present

## 2013-12-16 DIAGNOSIS — Z720 Tobacco use: Secondary | ICD-10-CM | POA: Insufficient documentation

## 2013-12-16 DIAGNOSIS — F419 Anxiety disorder, unspecified: Secondary | ICD-10-CM | POA: Insufficient documentation

## 2013-12-16 DIAGNOSIS — I10 Essential (primary) hypertension: Secondary | ICD-10-CM | POA: Insufficient documentation

## 2013-12-16 DIAGNOSIS — T82837D Hemorrhage of cardiac prosthetic devices, implants and grafts, subsequent encounter: Secondary | ICD-10-CM

## 2013-12-16 DIAGNOSIS — I447 Left bundle-branch block, unspecified: Secondary | ICD-10-CM | POA: Insufficient documentation

## 2013-12-16 DIAGNOSIS — I2510 Atherosclerotic heart disease of native coronary artery without angina pectoris: Secondary | ICD-10-CM | POA: Insufficient documentation

## 2013-12-16 DIAGNOSIS — I255 Ischemic cardiomyopathy: Secondary | ICD-10-CM | POA: Insufficient documentation

## 2013-12-16 DIAGNOSIS — E119 Type 2 diabetes mellitus without complications: Secondary | ICD-10-CM | POA: Insufficient documentation

## 2013-12-16 DIAGNOSIS — M1990 Unspecified osteoarthritis, unspecified site: Secondary | ICD-10-CM | POA: Insufficient documentation

## 2013-12-16 DIAGNOSIS — Z951 Presence of aortocoronary bypass graft: Secondary | ICD-10-CM | POA: Insufficient documentation

## 2013-12-16 DIAGNOSIS — I5042 Chronic combined systolic (congestive) and diastolic (congestive) heart failure: Secondary | ICD-10-CM | POA: Insufficient documentation

## 2013-12-16 DIAGNOSIS — E785 Hyperlipidemia, unspecified: Secondary | ICD-10-CM | POA: Insufficient documentation

## 2013-12-16 DIAGNOSIS — I252 Old myocardial infarction: Secondary | ICD-10-CM | POA: Insufficient documentation

## 2013-12-16 HISTORY — PX: POCKET REVISION: SHX5488

## 2013-12-16 LAB — GLUCOSE, CAPILLARY: Glucose-Capillary: 141 mg/dL — ABNORMAL HIGH (ref 70–99)

## 2013-12-16 LAB — CBC
HCT: 38.5 % — ABNORMAL LOW (ref 39.0–52.0)
Hemoglobin: 13 g/dL (ref 13.0–17.0)
MCH: 30.9 pg (ref 26.0–34.0)
MCHC: 33.8 g/dL (ref 30.0–36.0)
MCV: 91.4 fL (ref 78.0–100.0)
PLATELETS: 170 10*3/uL (ref 150–400)
RBC: 4.21 MIL/uL — ABNORMAL LOW (ref 4.22–5.81)
RDW: 14.4 % (ref 11.5–15.5)
WBC: 8.8 10*3/uL (ref 4.0–10.5)

## 2013-12-16 LAB — BASIC METABOLIC PANEL
ANION GAP: 12 (ref 5–15)
BUN: 23 mg/dL (ref 6–23)
CHLORIDE: 105 meq/L (ref 96–112)
CO2: 23 mEq/L (ref 19–32)
Calcium: 9.4 mg/dL (ref 8.4–10.5)
Creatinine, Ser: 0.98 mg/dL (ref 0.50–1.35)
GFR, EST NON AFRICAN AMERICAN: 84 mL/min — AB (ref >90)
Glucose, Bld: 147 mg/dL — ABNORMAL HIGH (ref 70–99)
POTASSIUM: 4.7 meq/L (ref 3.7–5.3)
SODIUM: 140 meq/L (ref 137–147)

## 2013-12-16 LAB — SURGICAL PCR SCREEN
MRSA, PCR: NEGATIVE
STAPHYLOCOCCUS AUREUS: NEGATIVE

## 2013-12-16 SURGERY — POCKET REVISION
Anesthesia: LOCAL

## 2013-12-16 MED ORDER — ONDANSETRON HCL 4 MG/2ML IJ SOLN: 4.0000 mg | Freq: Four times a day (QID) | INTRAMUSCULAR | Status: DC | PRN

## 2013-12-16 MED ORDER — MIDAZOLAM HCL 5 MG/5ML IJ SOLN
INTRAMUSCULAR | Status: AC
  Filled 2013-12-16: qty 5

## 2013-12-16 MED ORDER — MUPIROCIN 2 % EX OINT
1.0000 "application " | TOPICAL_OINTMENT | Freq: Once | CUTANEOUS | Status: DC
  Filled 2013-12-16: qty 22

## 2013-12-16 MED ORDER — SODIUM CHLORIDE 0.9 % IV SOLN: INTRAVENOUS | Status: DC

## 2013-12-16 MED ORDER — MUPIROCIN 2 % EX OINT
TOPICAL_OINTMENT | CUTANEOUS | Status: AC
  Filled 2013-12-16: qty 22

## 2013-12-16 MED ORDER — FENTANYL CITRATE 0.05 MG/ML IJ SOLN
INTRAMUSCULAR | Status: AC
  Filled 2013-12-16: qty 2

## 2013-12-16 MED ORDER — HEPARIN (PORCINE) IN NACL 2-0.9 UNIT/ML-% IJ SOLN
INTRAMUSCULAR | Status: AC
  Filled 2013-12-16: qty 500

## 2013-12-16 MED ORDER — HYDROCODONE-ACETAMINOPHEN 5-325 MG PO TABS: 1.0000 | ORAL_TABLET | Freq: Four times a day (QID) | ORAL | Status: DC | PRN

## 2013-12-16 MED ORDER — CHLORHEXIDINE GLUCONATE 4 % EX LIQD
60.0000 mL | Freq: Once | CUTANEOUS | Status: DC
  Filled 2013-12-16: qty 60

## 2013-12-16 MED ORDER — LIDOCAINE HCL (PF) 1 % IJ SOLN
INTRAMUSCULAR | Status: AC
  Filled 2013-12-16: qty 60

## 2013-12-16 MED ORDER — ACETAMINOPHEN 325 MG PO TABS: 325.0000 mg | ORAL_TABLET | ORAL | Status: DC | PRN

## 2013-12-16 NOTE — H&P (Signed)
HPI  Larry Rangel is a 63 y.o. male Seen in follow-up for hematoma occurring in the context of a subcutaneous ICD. It occurred 1-2 weeks following device implantation with resumption of anticoagulation. He was initially treated with a pressure dressing 1 week. Interval photographs suggested significant improvement. At that time the pocket hematoma was relatively soft. He comes in today for follow-up. The pocket hematoma is greater. There is no tenderness.   During hospitalization 7/15 EF was noted to be 20%. He had recurrences of atrial fibrillation.he underwent cath showing:  1. Severe triple vessel CAD s/p 5V CABG with 3/5 patent bypass grafts. The graft to the left sided PL is occluded and the graft to the diagonal is occluded (both appear to be chronic occlusions)  2. Moderate disease in diagonal and left sided posterolateral branch, does not appear to be flow limiting.  3. Elevated filling pressures  ECG demonstrated left bundle branch block with a QRS duration of 150. Repeat ECG shows a QRS duration of 130 milliseconds and a more nonspecific pattern  Past Medical History  Diagnosis Date  . Anxiety   . Hypertension   . Low back pain   . Tobacco user   . CAD (coronary artery disease)     a. s/p CABG 2009. b. Cath 07/2013: 3/5 patent grafts (appear to have chronic occ grafts), mod diag/L PL branch, did not appear flow limiting, elevated filling pressures.  . Hyperlipidemia   . S/P colonoscopy   . Myocardial infarction   . Arthritis   . Paroxysmal atrial flutter 07-2012    a. s/p ablation by Dr [name] 08-06-2012. b. Recurrence 07/2013.   Marland Kitchen Chicken pox as a child  . Measles as a child  . Mumps as a child  . Abnormal liver function 08/23/2010  . Chronic combined systolic and diastolic CHF (congestive heart failure)     a. Echo 6/14: Mild LVH, EF 30-35%, diffuse HK, MAC, mild BAE. b. Drop in EF to 20% by echo 07/2013.  .  Diabetes mellitus   . PAF (paroxysmal atrial fibrillation)   . LBBB (left bundle branch block)     Past Surgical History  Procedure Laterality Date  . Shoulder surgery    . Coronary artery bypass graft  2009    x 5  . Tee without cardioversion N/A 07/15/2012    Procedure: TRANSESOPHAGEAL ECHOCARDIOGRAM (TEE); Surgeon: Thayer Headings, MD; Location: [facility]; Service: Cardiovascular; Laterality: N/A;  . Cardioversion N/A 07/15/2012    Procedure: CARDIOVERSION; Surgeon: Thayer Headings, MD; Location: [facility]; Service: Cardiovascular; Laterality: N/A;  . Ablation of dysrhythmic focus  08/06/2012    CTI ablation by Dr [name]  . Tonsillectomy    . Cardiac catheterization      Current Outpatient Prescriptions  Medication Sig Dispense Refill  . ACCU-CHEK FASTCLIX LANCETS MISC     . ACCU-CHEK SMARTVIEW test strip     . atorvastatin (LIPITOR) 20 MG tablet Take 1 tablet (20 mg total) by mouth daily. 90 tablet 1  . Blood Glucose Monitoring Suppl (ACCU-CHEK NANO SMARTVIEW) W/DEVICE KIT     . busPIRone (BUSPAR) 10 MG tablet Take 10 mg by mouth 2 (two) times daily.    . carvedilol (COREG) 12.5 MG tablet Take 1 tablet (12.5 mg total) by mouth 2 (two) times daily with a meal. 60 tablet 12  . clonazePAM (KLONOPIN) 0.5 MG tablet TAKE 1 TABLET BY MOUTH 3 TIMES A DAY AS NEEDED FOR ANXIETY 90 tablet 0  . cyclobenzaprine (FLEXERIL)  10 MG tablet Take 10 mg by mouth 3 (three) times daily as needed for muscle spasms.    . furosemide (LASIX) 40 MG tablet Take 40 mg by mouth daily.     Marland Kitchen glipiZIDE (GLUCOTROL) 2.5 mg TABS tablet Take 0.5 tablets (2.5 mg total) by mouth daily before breakfast. 30 tablet 12  . Hydrocodone-Acetaminophen (VICODIN) 5-300 MG TABS Take 1 tablet by mouth every 6 (six) hours as needed. 10 each 0  . irbesartan (AVAPRO) 300 MG tablet Take 1 tablet (300 mg  total) by mouth daily. 30 tablet 12  . metFORMIN (GLUCOPHAGE) 500 MG tablet Take 1 tablet (500 mg total) by mouth 2 (two) times daily with a meal. 60 tablet 6  . rivaroxaban (XARELTO) 20 MG TABS tablet Take 1 tablet (20 mg total) by mouth daily with supper. 30 tablet 12  . spironolactone (ALDACTONE) 25 MG tablet Take 1 tablet (25 mg total) by mouth daily. 30 tablet 6   No current facility-administered medications for this visit.    No Known Allergies  Review of Systems negative except from HPI and PMH  Physical Exam BP 144/80 mmHg  Pulse 73  Ht '6\' 2"'  (1.88 m)  Wt 211 lb (95.709 kg)  BMI 27.08 kg/m2 Well developed and well nourished in no acute distress HENT normal E scleral and icterus clear Neck Supple JVP flat; carotids brisk and full Clear to ausculation  Pocket hematoma with increasing firmness and size Regular rate and rhythm, no murmurs gallops or rub Soft with active bowel sounds No clubbing cyanosis Edema Alert and oriented, grossly normal motor and sensory function Skin Warm and Dry    Assessment and Plan  Subcutaneous ICD hematoma  Atrial fibrillation associated with Rivaroxaban the resumption of which was temporally associated with the above  Ischemic cardio myopathy with prior bypass and depressed LV function EF 25%  The patient has a hematoma that initially improved with a pressure dressing but has now worsened. I have reviewed this with Dr. Lovena Le. Evacuation is indicated. He will be submitted for this at the first available date which is Friday  EP Attending  Patient seen and examined. Discussed with Dr. [name]. I have reviewed his records. The patient will undergo ICD pocket hematoma evacuation and revision. I have discussed the indications with the patient and he wishes to proceed.  Mikle Bosworth.D.

## 2013-12-16 NOTE — Discharge Instructions (Signed)

## 2013-12-17 ENCOUNTER — Other Ambulatory Visit: Payer: Self-pay | Admitting: Cardiology

## 2013-12-17 ENCOUNTER — Telehealth: Payer: Self-pay | Admitting: Cardiology

## 2013-12-17 MED ORDER — HYDROCODONE-ACETAMINOPHEN 5-300 MG PO TABS: 1.0000 | ORAL_TABLET | Freq: Four times a day (QID) | ORAL | Status: DC | PRN

## 2013-12-17 NOTE — Telephone Encounter (Signed)
Pt called requesting pain medication. The pharmacy would not re order without a written Rx which I will provide, his wife will pick it up. Vicodin 5/300 # `10, no refills.  Corine Shelter PA-C 12/17/2013 3:39 PM

## 2013-12-19 ENCOUNTER — Encounter: Payer: Commercial Managed Care - HMO | Admitting: Internal Medicine

## 2013-12-19 ENCOUNTER — Ambulatory Visit (INDEPENDENT_AMBULATORY_CARE_PROVIDER_SITE_OTHER): Payer: Medicare HMO | Admitting: *Deleted

## 2013-12-19 DIAGNOSIS — I429 Cardiomyopathy, unspecified: Secondary | ICD-10-CM

## 2013-12-19 NOTE — Progress Notes (Signed)
Office visit for red alert sent 12/18/13.  No alert recorded.  Checked by [employer].  ROV 12/28/13.

## 2013-12-21 NOTE — CV Procedure (Signed)
Electrophysiology Procedure Note  Procecdure: ICD pocket hematoma evacuation  Preoperative diagnosis: ICD pocket hematoma  Postoperative diagnosis: same as pre-operative diagnosis  Description of the Procedure: After informed consent was obtained, the patient was taken to the diagnostic electrophysiology laboratory in the fasting state. After the usual preparation and draping, 40 cc of lidocaine was infiltrated into the left midaxillary line pocket and incision. A 7 cm incision was carried out over the old ICD pocket incision. Electrocautery was utilized to dissect down to the ICD pocket. A large amount of clotted serosanguineous blood was seen. The pocket was suctioned out. The pocket was irrigated. The pocket was inspected. There was no significant bleeding seen. Any small evidence of bleeding was cauterized with electrocautery. Additional irrigation of the pocket with antibiotic irrigation was carried out. The incision was closed with 3 layers of Vicryl suture. A pressure dressing was placed over the pocket. The patient was returned to the recovery area in satisfactory condition.  Complications: No immediate procedural complications.  Conclusion: Successful ICD pocket evacuation in a patient with a prior subcutaneous ICD who developed a pocket hematoma while on systemic anticoagulation.  Lewayne Bunting, M.D.

## 2013-12-22 ENCOUNTER — Encounter (HOSPITAL_COMMUNITY): Payer: Self-pay | Admitting: Internal Medicine

## 2013-12-28 ENCOUNTER — Ambulatory Visit (INDEPENDENT_AMBULATORY_CARE_PROVIDER_SITE_OTHER): Payer: Medicare HMO | Admitting: *Deleted

## 2013-12-28 DIAGNOSIS — I429 Cardiomyopathy, unspecified: Secondary | ICD-10-CM

## 2013-12-28 LAB — MDC_IDC_ENUM_SESS_TYPE_INCLINIC: Implantable Pulse Generator Serial Number: 108164

## 2013-12-28 NOTE — Progress Notes (Signed)
Wound recheck for pocket evacuation.  Steri strips removed, wound well healed.  Follow up as scheduled.  No episodes recorded.  Device interrogated by industry.

## 2013-12-30 ENCOUNTER — Telehealth: Payer: Self-pay | Admitting: *Deleted

## 2013-12-30 NOTE — Telephone Encounter (Signed)
Advised patient to restart Xarelto next week, per Dr. Graciela Husbands.  Patient verbalized understanding and agreeable.

## 2014-01-18 ENCOUNTER — Other Ambulatory Visit: Payer: Self-pay | Admitting: Family Medicine

## 2014-01-18 NOTE — Telephone Encounter (Signed)
Patient requesting Klonopin 0.5mg .    Last refill 12/14/13 for 90 with 0 refills Last office visit 12/02/13.  No UDS on file, no contract.   Please advise    EAL

## 2014-01-19 ENCOUNTER — Other Ambulatory Visit: Payer: Self-pay | Admitting: Internal Medicine

## 2014-01-19 NOTE — Telephone Encounter (Signed)
Rx printed and signed and faxed to the pharmacy.//AB/CMA 

## 2014-01-25 ENCOUNTER — Other Ambulatory Visit: Payer: Self-pay | Admitting: Family Medicine

## 2014-01-25 MED ORDER — ATORVASTATIN CALCIUM 20 MG PO TABS: 20.0000 mg | ORAL_TABLET | Freq: Every day | ORAL | Status: DC

## 2014-01-25 NOTE — Telephone Encounter (Signed)
Last filled:  06/23/13 Amt: 90, 1 refill Last OV:  12/02/13  Med filled.

## 2014-01-25 NOTE — Telephone Encounter (Signed)
Caller name: [facility] [address] Relation to pt: Call back number: [phone] Pharmacy:  Reason for call:   Requesting refill of atorvastatin for patient

## 2014-02-10 ENCOUNTER — Encounter: Payer: Commercial Managed Care - HMO | Admitting: Internal Medicine

## 2014-02-13 ENCOUNTER — Encounter (HOSPITAL_COMMUNITY): Payer: Self-pay | Admitting: Emergency Medicine

## 2014-02-13 ENCOUNTER — Emergency Department (HOSPITAL_COMMUNITY): Payer: Medicare HMO

## 2014-02-13 ENCOUNTER — Encounter: Payer: Self-pay | Admitting: Internal Medicine

## 2014-02-13 ENCOUNTER — Observation Stay (HOSPITAL_COMMUNITY)
Admission: EM | Admit: 2014-02-13 | Discharge: 2014-02-14 | Disposition: A | Discharge status: Home / Self Care | Payer: Medicare HMO | Attending: Interventional Cardiology | Admitting: Interventional Cardiology

## 2014-02-13 DIAGNOSIS — Z95810 Presence of automatic (implantable) cardiac defibrillator: Secondary | ICD-10-CM | POA: Diagnosis not present

## 2014-02-13 DIAGNOSIS — Z951 Presence of aortocoronary bypass graft: Secondary | ICD-10-CM | POA: Diagnosis not present

## 2014-02-13 DIAGNOSIS — Z87891 Personal history of nicotine dependence: Secondary | ICD-10-CM | POA: Diagnosis not present

## 2014-02-13 DIAGNOSIS — E785 Hyperlipidemia, unspecified: Secondary | ICD-10-CM | POA: Insufficient documentation

## 2014-02-13 DIAGNOSIS — Z7901 Long term (current) use of anticoagulants: Secondary | ICD-10-CM | POA: Insufficient documentation

## 2014-02-13 DIAGNOSIS — R079 Chest pain, unspecified: Secondary | ICD-10-CM | POA: Insufficient documentation

## 2014-02-13 DIAGNOSIS — S060X9A Concussion with loss of consciousness of unspecified duration, initial encounter: Secondary | ICD-10-CM | POA: Diagnosis not present

## 2014-02-13 DIAGNOSIS — I5042 Chronic combined systolic (congestive) and diastolic (congestive) heart failure: Secondary | ICD-10-CM | POA: Insufficient documentation

## 2014-02-13 DIAGNOSIS — R55 Syncope and collapse: Secondary | ICD-10-CM | POA: Diagnosis not present

## 2014-02-13 DIAGNOSIS — Z79899 Other long term (current) drug therapy: Secondary | ICD-10-CM | POA: Diagnosis not present

## 2014-02-13 DIAGNOSIS — F419 Anxiety disorder, unspecified: Secondary | ICD-10-CM | POA: Insufficient documentation

## 2014-02-13 DIAGNOSIS — R42 Dizziness and giddiness: Secondary | ICD-10-CM | POA: Diagnosis not present

## 2014-02-13 DIAGNOSIS — I2510 Atherosclerotic heart disease of native coronary artery without angina pectoris: Secondary | ICD-10-CM

## 2014-02-13 DIAGNOSIS — E119 Type 2 diabetes mellitus without complications: Secondary | ICD-10-CM | POA: Insufficient documentation

## 2014-02-13 DIAGNOSIS — I480 Paroxysmal atrial fibrillation: Secondary | ICD-10-CM | POA: Diagnosis not present

## 2014-02-13 DIAGNOSIS — R0602 Shortness of breath: Secondary | ICD-10-CM | POA: Diagnosis not present

## 2014-02-13 DIAGNOSIS — I252 Old myocardial infarction: Secondary | ICD-10-CM | POA: Diagnosis not present

## 2014-02-13 DIAGNOSIS — I447 Left bundle-branch block, unspecified: Secondary | ICD-10-CM | POA: Insufficient documentation

## 2014-02-13 DIAGNOSIS — M1990 Unspecified osteoarthritis, unspecified site: Secondary | ICD-10-CM | POA: Insufficient documentation

## 2014-02-13 DIAGNOSIS — I10 Essential (primary) hypertension: Secondary | ICD-10-CM | POA: Diagnosis not present

## 2014-02-13 DIAGNOSIS — I951 Orthostatic hypotension: Secondary | ICD-10-CM | POA: Diagnosis not present

## 2014-02-13 LAB — CBC
HEMATOCRIT: 36.9 % — AB (ref 39.0–52.0)
Hemoglobin: 13 g/dL (ref 13.0–17.0)
MCH: 31.5 pg (ref 26.0–34.0)
MCHC: 35.2 g/dL (ref 30.0–36.0)
MCV: 89.3 fL (ref 78.0–100.0)
PLATELETS: 233 10*3/uL (ref 150–400)
RBC: 4.13 MIL/uL — ABNORMAL LOW (ref 4.22–5.81)
RDW: 12.4 % (ref 11.5–15.5)
WBC: 10.4 10*3/uL (ref 4.0–10.5)

## 2014-02-13 LAB — BASIC METABOLIC PANEL
Anion gap: 18 — ABNORMAL HIGH (ref 5–15)
BUN: 37 mg/dL — ABNORMAL HIGH (ref 6–23)
CHLORIDE: 97 mmol/L (ref 96–112)
CO2: 14 mmol/L — ABNORMAL LOW (ref 19–32)
Calcium: 9.8 mg/dL (ref 8.4–10.5)
Creatinine, Ser: 1.89 mg/dL — ABNORMAL HIGH (ref 0.50–1.35)
GFR calc Af Amer: 41 mL/min — ABNORMAL LOW (ref >90)
GFR calc non Af Amer: 36 mL/min — ABNORMAL LOW (ref >90)
Glucose, Bld: 98 mg/dL (ref 70–99)
Potassium: 5 mmol/L (ref 3.5–5.1)
Sodium: 129 mmol/L — ABNORMAL LOW (ref 135–145)

## 2014-02-13 LAB — TROPONIN I: Troponin I: 0.13 ng/mL — ABNORMAL HIGH (ref <0.031)

## 2014-02-13 LAB — URINALYSIS, ROUTINE W REFLEX MICROSCOPIC
BILIRUBIN URINE: NEGATIVE
GLUCOSE, UA: 100 mg/dL — AB
HGB URINE DIPSTICK: NEGATIVE
Ketones, ur: NEGATIVE mg/dL
Leukocytes, UA: NEGATIVE
Nitrite: NEGATIVE
Protein, ur: NEGATIVE mg/dL
Specific Gravity, Urine: 1.015 (ref 1.005–1.030)
Urobilinogen, UA: 0.2 mg/dL (ref 0.0–1.0)
pH: 5 (ref 5.0–8.0)

## 2014-02-13 LAB — I-STAT TROPONIN, ED: Troponin i, poc: 0.06 ng/mL (ref 0.00–0.08)

## 2014-02-13 LAB — BRAIN NATRIURETIC PEPTIDE: B Natriuretic Peptide: 20.5 pg/mL (ref 0.0–100.0)

## 2014-02-13 LAB — GLUCOSE, CAPILLARY: Glucose-Capillary: 133 mg/dL — ABNORMAL HIGH (ref 70–99)

## 2014-02-13 LAB — PROTIME-INR
INR: 1.66 — AB (ref 0.00–1.49)
PROTHROMBIN TIME: 19.8 s — AB (ref 11.6–15.2)

## 2014-02-13 LAB — TSH: TSH: 0.276 u[IU]/mL — ABNORMAL LOW (ref 0.350–4.500)

## 2014-02-13 LAB — CBG MONITORING, ED: GLUCOSE-CAPILLARY: 138 mg/dL — AB (ref 70–99)

## 2014-02-13 MED ORDER — ATORVASTATIN CALCIUM 20 MG PO TABS
20.0000 mg | ORAL_TABLET | Freq: Every day | ORAL | Status: DC
  Administered 2014-02-13 – 2014-02-14 (×2): 20 mg via ORAL
  Filled 2014-02-13 (×2): qty 1

## 2014-02-13 MED ORDER — CARVEDILOL 12.5 MG PO TABS
12.5000 mg | ORAL_TABLET | Freq: Two times a day (BID) | ORAL | Status: DC
  Administered 2014-02-14: 12.5 mg via ORAL
  Filled 2014-02-13 (×3): qty 1

## 2014-02-13 MED ORDER — RIVAROXABAN 20 MG PO TABS: 20.0000 mg | ORAL_TABLET | Freq: Every day | ORAL | Status: DC

## 2014-02-13 MED ORDER — SODIUM CHLORIDE 0.9 % IV SOLN
INTRAVENOUS | Status: DC
  Administered 2014-02-13: 18:00:00 via INTRAVENOUS
  Administered 2014-02-14: 1000 mL via INTRAVENOUS

## 2014-02-13 MED ORDER — ACETAMINOPHEN 325 MG PO TABS: 650.0000 mg | ORAL_TABLET | ORAL | Status: DC | PRN

## 2014-02-13 MED ORDER — SODIUM CHLORIDE 0.9 % IJ SOLN: 3.0000 mL | INTRAMUSCULAR | Status: DC | PRN

## 2014-02-13 MED ORDER — CLONAZEPAM 0.5 MG PO TABS
0.5000 mg | ORAL_TABLET | Freq: Two times a day (BID) | ORAL | Status: DC
  Administered 2014-02-13 – 2014-02-14 (×2): 0.5 mg via ORAL
  Filled 2014-02-13 (×2): qty 1

## 2014-02-13 MED ORDER — RIVAROXABAN 20 MG PO TABS
20.0000 mg | ORAL_TABLET | Freq: Every day | ORAL | Status: DC
  Administered 2014-02-13: 20 mg via ORAL
  Filled 2014-02-13 (×2): qty 1

## 2014-02-13 MED ORDER — IRBESARTAN 300 MG PO TABS
300.0000 mg | ORAL_TABLET | Freq: Every day | ORAL | Status: DC
  Administered 2014-02-14: 300 mg via ORAL
  Filled 2014-02-13: qty 1

## 2014-02-13 MED ORDER — SODIUM CHLORIDE 0.9 % IJ SOLN
3.0000 mL | Freq: Two times a day (BID) | INTRAMUSCULAR | Status: DC
  Administered 2014-02-13: 3 mL via INTRAVENOUS

## 2014-02-13 MED ORDER — BUSPIRONE HCL 10 MG PO TABS
10.0000 mg | ORAL_TABLET | Freq: Two times a day (BID) | ORAL | Status: DC
  Administered 2014-02-13 – 2014-02-14 (×2): 10 mg via ORAL
  Filled 2014-02-13 (×3): qty 1

## 2014-02-13 MED ORDER — SODIUM CHLORIDE 0.9 % IV SOLN: 250.0000 mL | INTRAVENOUS | Status: DC | PRN

## 2014-02-13 MED ORDER — SODIUM CHLORIDE 0.9 % IV SOLN: INTRAVENOUS | Status: DC

## 2014-02-13 MED ORDER — ASPIRIN EC 81 MG PO TBEC
81.0000 mg | DELAYED_RELEASE_TABLET | Freq: Every day | ORAL | Status: DC
  Filled 2014-02-13: qty 1

## 2014-02-13 MED ORDER — MECLIZINE HCL 25 MG PO TABS
25.0000 mg | ORAL_TABLET | Freq: Once | ORAL | Status: AC
  Administered 2014-02-13: 25 mg via ORAL
  Filled 2014-02-13: qty 1

## 2014-02-13 MED ORDER — NITROGLYCERIN 0.4 MG SL SUBL: 0.4000 mg | SUBLINGUAL_TABLET | SUBLINGUAL | Status: DC | PRN

## 2014-02-13 MED ORDER — ONDANSETRON HCL 4 MG/2ML IJ SOLN: 4.0000 mg | Freq: Four times a day (QID) | INTRAMUSCULAR | Status: DC | PRN

## 2014-02-13 MED ORDER — SODIUM CHLORIDE 0.9 % IV BOLUS (SEPSIS)
1000.0000 mL | Freq: Once | INTRAVENOUS | Status: AC
  Administered 2014-02-13: 1000 mL via INTRAVENOUS

## 2014-02-13 NOTE — ED Notes (Signed)
Pt reports he woke up, was walking down the hallway feeling faint and dizzy then passed out. Pt woke up on the floor. Pt awake, alert, oriented x4.

## 2014-02-13 NOTE — Progress Notes (Signed)
Patient arrived from the ED. Patient A/O, VSS, no complaints of pain at this time. Telemetry applied to patient and called to central monitoring. Report to be given to [facility]. [place], RN

## 2014-02-13 NOTE — ED Provider Notes (Signed)
CSN: [id number]     Arrival date & time 02/13/14  1119 History   First MD Initiated Contact with Patient 02/13/14 1140     Chief Complaint  Patient presents with  . Loss of Consciousness  . Shortness of Breath     (Consider location/radiation/quality/duration/timing/severity/associated sxs/prior Treatment) HPI Comments: 64 year old male with an extensive past medical history presenting to the ED with his wife after having a non-witnessed syncopal episode around 8:30 AM today. Patient reports he was walking down the hallway, started to feel lightheaded and dizzy, and the next thing he knew he woke up on the hardwood floor. Unknown how long he had last consciousness. Currently he reports he "just does not feel good", endorses nonradiating, midsternal chest pain, shortness of breath both at rest and on exertion, dizziness upon sitting up as if he is spinning, lightheadedness and weakness. States he has been feeling the symptoms over the past few days. He is slightly nauseated. Denies vomiting or fevers. Denies extremity numbness, weakness or tingling. One month ago his ICD was replaced due to the initial one developing an infection. Cardiologist Dr. Graciela Husbands.  The history is provided by the patient and the spouse.     Past Medical History  Diagnosis Date  . Anxiety   . Hypertension   . Low back pain   . Tobacco user   . CAD (coronary artery disease)     a. s/p CABG 2009. b. Cath 07/2013: 3/5 patent grafts (appear to have chronic occ grafts), mod diag/L PL branch, did not appear flow limiting, elevated filling pressures.  . Hyperlipidemia   . S/P colonoscopy   . Myocardial infarction   . Arthritis   . Paroxysmal atrial flutter 07-2012    a. s/p ablation by Dr Graciela Husbands 08-06-2012. b. Recurrence 07/2013.   Marland Kitchen Chicken pox as a child  . Measles as a child  . Mumps as a child  . Abnormal liver function 08/23/2010  . Chronic combined systolic and diastolic CHF (congestive heart failure)     a. Echo  6/14: Mild LVH, EF 30-35%, diffuse HK, MAC, mild BAE. b. Drop in EF to 20% by echo 07/2013.  . Diabetes mellitus   . PAF (paroxysmal atrial fibrillation)   . LBBB (left bundle branch block)    Past Surgical History  Procedure Laterality Date  . Shoulder surgery    . Coronary artery bypass graft  2009    x 5  . Tee without cardioversion N/A 07/15/2012    Procedure: TRANSESOPHAGEAL ECHOCARDIOGRAM (TEE);  Surgeon: Vesta Mixer, MD;  Location: [facility] ENDOSCOPY;  Service: Cardiovascular;  Laterality: N/A;  . Cardioversion N/A 07/15/2012    Procedure: CARDIOVERSION;  Surgeon: Vesta Mixer, MD;  Location: [facility] ENDOSCOPY;  Service: Cardiovascular;  Laterality: N/A;  . Ablation of dysrhythmic focus  08/06/2012    CTI ablation by Dr Graciela Husbands  . Tonsillectomy    . Cardiac catheterization    . Atrial flutter ablation N/A 08/06/2012    Procedure: ATRIAL FLUTTER ABLATION;  Surgeon: Duke Salvia, MD;  Location: [facility] CATH LAB;  Service: Cardiovascular;  Laterality: N/A;  . Left heart catheterization with coronary angiogram N/A 08/01/2013    Procedure: LEFT HEART CATHETERIZATION WITH CORONARY ANGIOGRAM;  Surgeon: Kathleene Hazel, MD;  Location: [facility] CATH LAB;  Service: Cardiovascular;  Laterality: N/A;  . Implantable cardioverter defibrillator implant N/A 11/10/2013    Procedure: SUB Q IMPLANTABLE CARDIOVERTER DEFIBRILLATOR IMPLANT;  Surgeon: Duke Salvia, MD;  Location: [facility] CATH LAB;  Service:  Cardiovascular;  Laterality: N/A;  . Pocket revision N/A 12/16/2013    Procedure: POCKET REVISION;  Surgeon: Marinus Maw, MD;  Location: [facility] CATH LAB;  Service: Cardiovascular;  Laterality: N/A;   Family History  Problem Relation Age of Onset  . Alzheimer's disease Mother   . Heart failure Father 72  . Hypertension Father   . Hyperlipidemia Father   . Cancer Father     lung- took half of a young- smoker  . Heart disease Father     MI at 29  . Early death Neg Hx   . Kidney disease Neg Hx   . Stroke Neg Hx    . Leukemia Brother   . Drug abuse Daughter     heroine   History  Substance Use Topics  . Smoking status: Former Smoker -- 1.00 packs/day for 40 years    Types: Cigarettes    Quit date: 02/01/2011  . Smokeless tobacco: Never Used  . Alcohol Use: 14.4 oz/week    24 Glasses of wine per week     Comment: drinks 2 bottles of wine per week    Review of Systems  Constitutional: Positive for fatigue.  Respiratory: Positive for shortness of breath.   Cardiovascular: Positive for chest pain.  Gastrointestinal: Positive for nausea.  Neurological: Positive for dizziness, syncope, weakness and light-headedness.  All other systems reviewed and are negative.     Allergies  Review of patient's allergies indicates no known allergies.  Home Medications   Prior to Admission medications   Medication Sig Start Date End Date Taking? Authorizing Provider  atorvastatin (LIPITOR) 20 MG tablet Take 1 tablet (20 mg total) by mouth daily. 01/25/14  Yes Bradd Canary, MD  busPIRone (BUSPAR) 10 MG tablet Take 10 mg by mouth 2 (two) times daily.   Yes Historical Provider, MD  carvedilol (COREG) 12.5 MG tablet Take 1 tablet (12.5 mg total) by mouth 2 (two) times daily with a meal. 08/04/13  Yes Azalee Course, PA  clonazePAM (KLONOPIN) 0.5 MG tablet TAKE 1 TABLET BY MOUTH 3 TIMES A DAY 01/18/14  Yes Bradd Canary, MD  clonazePAM (KLONOPIN) 0.5 MG tablet Take 0.5 mg by mouth 2 (two) times daily.   Yes Historical Provider, MD  furosemide (LASIX) 40 MG tablet TAKE 1 TABLET BY MOUTH DAILY. IF WEIGHT INCREASED > 3LBS OVERNIGHT INCREASE BY ONE TABLET 01/19/14  Yes Duke Salvia, MD  glipiZIDE (GLUCOTROL) 2.5 mg TABS tablet Take 0.5 tablets (2.5 mg total) by mouth daily before breakfast. 08/04/13  Yes Azalee Course, PA  glipiZIDE (GLUCOTROL) 5 MG tablet Take 2.5 mg by mouth daily before breakfast.   Yes Historical Provider, MD  irbesartan (AVAPRO) 300 MG tablet Take 1 tablet (300 mg total) by mouth daily. 08/04/13  Yes Azalee Course, PA  metFORMIN (GLUCOPHAGE) 500 MG tablet Take 1 tablet (500 mg total) by mouth 2 (two) times daily with a meal. 08/04/13  Yes Azalee Course, PA  OVER THE COUNTER MEDICATION Place 1 drop into both eyes as needed (for dry eyes). "OTC Good Sense Eye Drops"   Yes Historical Provider, MD  rivaroxaban (XARELTO) 20 MG TABS tablet Take 1 tablet (20 mg total) by mouth daily with supper. 08/04/13  Yes Azalee Course, PA  spironolactone (ALDACTONE) 25 MG tablet Take 1 tablet (25 mg total) by mouth daily. 08/15/13  Yes Dayna N Dunn, PA-C  Hydrocodone-Acetaminophen (VICODIN) 5-300 MG TABS Take 1 tablet by mouth every 6 (six) hours as needed. Patient not taking:  Reported on 02/13/2014 12/17/13   Eda Paschal Kilroy, PA-C   BP 136/71 mmHg  Pulse 78  Temp(Src) 98 F (36.7 C) (Oral)  Resp 18  Ht  (1.88 m)  Wt 212 lb (96.163 kg)  BMI 27.21 kg/m2  SpO2 98% Physical Exam  Constitutional: He is oriented to person, place, and time. He appears well-developed and well-nourished. No distress.  HENT:  Head: Normocephalic and atraumatic.  Mouth/Throat: Oropharynx is clear and moist.  Eyes: Conjunctivae and EOM are normal. Pupils are equal, round, and reactive to light.  Neck: Normal range of motion. Neck supple. No JVD present.  Cardiovascular: Normal rate, regular rhythm, normal heart sounds and intact distal pulses.   No extremity edema.  Pulmonary/Chest: Effort normal and breath sounds normal. No respiratory distress.  Abdominal: Soft. Bowel sounds are normal. There is no tenderness.  Musculoskeletal: Normal range of motion. He exhibits no edema.  Neurological: He is alert and oriented to person, place, and time. He has normal strength. No sensory deficit. Coordination normal.  Speech fluent, goal oriented. Moves limbs without ataxia. Equal grip strength bilateral.  Skin: Skin is warm and dry. He is not diaphoretic.  Psychiatric: He has a normal mood and affect. His behavior is normal.  Nursing note and vitals  reviewed.   ED Course  Procedures (including critical care time) Labs Review Labs Reviewed  CBC - Abnormal; Notable for the following:    RBC 4.13 (*)    HCT 36.9 (*)    All other components within normal limits  URINALYSIS, ROUTINE W REFLEX MICROSCOPIC - Abnormal; Notable for the following:    Glucose, UA 100 (*)    All other components within normal limits  CBG MONITORING, ED - Abnormal; Notable for the following:    Glucose-Capillary 138 (*)    All other components within normal limits  URINE CULTURE  BRAIN NATRIURETIC PEPTIDE  I-STAT TROPOININ, ED    Imaging Review Dg Chest 2 View  02/13/2014   CLINICAL DATA:  Syncope and dizziness this morning.  EXAM: CHEST  2 VIEW  COMPARISON:  PA and lateral chest 08/03/2013.  FINDINGS: Single lead AICD is in place. The patient is status post CABG. Lungs are clear. Heart size is normal. No pneumothorax or pleural effusion.  IMPRESSION: No acute disease.   Electronically Signed   By: Drusilla Kanner M.D.   On: 02/13/2014 15:05   Ct Head Wo Contrast  02/13/2014   CLINICAL DATA:  Loss of consciousness and fall. Shortness of breath.  EXAM: CT HEAD WITHOUT CONTRAST  TECHNIQUE: Contiguous axial images were obtained from the base of the skull through the vertex without intravenous contrast.  COMPARISON:  None.  FINDINGS: Skull and Sinuses:Mild, scattered inflammatory mucosal thickening in the paranasal sinuses. No sinus effusion. No fracture or destructive process.  Orbits: No acute abnormality.  Brain: No evidence of acute infarction, hemorrhage, hydrocephalus, or mass lesion/mass effect. There is cortical volume loss which is mild to moderate for age and most prominent in the biparietal region.  IMPRESSION: No acute intracranial disease or fracture.   Electronically Signed   By: Tiburcio Pea M.D.   On: 02/13/2014 13:22     EKG Interpretation   Date/Time:  Monday February 13 2014 11:23:45 EST Ventricular Rate:  99 PR Interval:  192 QRS  Duration: 108 QT Interval:  330 QTC Calculation: 423 R Axis:   13 Text Interpretation:  Normal sinus rhythm Incomplete left bundle branch  block ST \\T \ T wave abnormality, consider  lateral ischemia Abnormal ECG  Since last tracing rate faster Confirmed by [facility]  MD, ELLIOTT [phone]) on  02/13/2014 12:18:25 PM      MDM   Final diagnoses:  Syncope, unspecified syncope type  Orthostatic hypotension   Patient in no apparent distress. Afebrile, vital signs stable, however orthostatic vital signs positive for orthostatic hypotension. No change and dizziness after meclizine. Head CT obtained given unwitnessed syncopal episode onto a hardwood floor. Head CT without acute findings. Cardiac workup negative. Pacemaker interrogated, no concerning findings. This report was brought to his cardiologist by the representative who did the interrogation. Given patient's significant cardiac history and unwitnessed syncopal episode, chest pain, shortness of breath and lightheadedness over the past weeks, cardiology consulted and will evaluate patient. Disposition pending cardiology consult. Pt signed out to Ladona Mow, PA-C.  Discussed with attending Dr. Effie Shy who also evaluated patient and agrees with plan of care.   Kathrynn Speed, PA-C 02/13/14 1558  Flint Melter, MD 02/13/14 [phone]

## 2014-02-13 NOTE — ED Notes (Signed)
Jason from Pt PCP here to interrogate SQ ICD [employer].

## 2014-02-13 NOTE — ED Provider Notes (Signed)
Patient signed out to me by Ailene Ravel, PA-C with plan to f/u on recommendations by Cardiology.    Larry Rangel is a 64 year old male with history of pacemaker/AICD presenting to the ER after an unwitnessed syncopal episode reported by patient. Patient was worked up for this during his ER stay, with interrogation of pacemaker which showed no acute events. Patient was noted to be orthostatic in the ER.   PE: Constitutional: well-developed, well-nourished, no apparent distress HENT: normocephalic, atraumatic Cardiovascular: normal rate and rhythm, distal pulses intact Pulmonary/Chest: effort normal; breath sounds clear and equal bilaterally; no wheezes or rales Abdominal: soft and nontender Musculoskeletal: full ROM, no edema Lymphadenopathy: no cervical adenopathy Neurological: alert with goal directed thinking Skin: warm and dry, no rash, no diaphoresis Psychiatric: normal mood and affect, normal behavior    EKG Interpretation  Date/Time:  Monday February 13 2014 11:23:45 EST Ventricular Rate:  99 PR Interval:  192 QRS Duration: 108 QT Interval:  330 QTC Calculation: 423 R Axis:   13 Text Interpretation:  Normal sinus rhythm Incomplete left bundle branch block ST \\T \ T wave abnormality, consider lateral ischemia Abnormal ECG Since last tracing rate faster Confirmed by Effie Shy  MD, Mechele Collin ([id number]) on 02/13/2014 12:18:25 PM       Cardiology came to see and evaluate patient.  Cardiology recommends patient be admitted for observation, fluid resuscitation and to have electrophysiology to see patient in the morning. The patient appears reasonably stabilized for admission considering the current resources, flow, and capabilities available in the ED at this time, and I doubt any other [facility] requiring further screening and/or treatment in the ED prior to admission.  BP 131/81 mmHg  Pulse 69  Temp(Src) 98 F (36.7 C) (Oral)  Resp 17  Ht 6\' 2"  (1.88 m)  Wt 212 lb (96.163 kg)  BMI 27.21 kg/m2   SpO2 99%  Signed,  Ladona Mow, PA-C 4:53 PM   Monte Fantasia, PA-C 02/13/14 1653  Rolan Bucco, MD 02/13/14 1810

## 2014-02-13 NOTE — H&P (Signed)
Patient ID: Larry Rangel MRN: [id number], DOB/AGE: 1950-10-02   Admit date: 02/13/2014   Primary Physician: Danise Edge, MD Primary Cardiologist: Dr. Graciela Husbands   Pt. Profile:  Larry Rangel is a 64 y.o. male with a history of CAD s/p CABG 2009, dyslipidemia, HTN, LBBB, atrial flutter s/p ablation 2014, recurrence 07/2013, ICM s/p ICD placement c/b hematoma and pocket revision, chronic mixed S/D CHF who presented to [facility] ED today with syncope.   He was admitted in 07/2013 for worsening a/c combined CHF with EF 20% and grade 3 diastolic dysfunction. He was also found to have recurrence of atrial fib/flutter. His troponins were elevated in the 1-2 range thus he underwent cath showing: 1. Severe triple vessel CAD s/p 5V CABG with 3/5 patent bypass grafts. The graft to the left sided PL is occluded and the graft to the diagonal is occluded (both appear to be chronic occlusions)  2. Moderate disease in diagonal and left sided posterolateral branch, does not appear to be flow limiting.  3. Elevated filling pressures His troponin elevation was felt due to demand ischemia. He was treated with IV diuresis. He was placed on Xarelto. He underwent ICD placement on 11/21/13 for primary prevention. He developed a significant hematoma and his xarelto was discontinued and it eventually required evacuation and pocket revision on 12/17/14. He was advised to restart Xarelto in late December.   He presented to the ED today with his wife after having a non-witnessed syncopal episode around 8:30 AM today. Patient reports he was walking down the hallway to find his wife and the next thing he knew was that he woke up on the hardwood floor. Unknown how long he had last consciousness. No prodromal sx. He woke up without feeling groggy or dizzy. He denies CP, SOB, palpitations, nausea, diaphoresis. He does note that over the past couple days he has felt dizzy and had episodes of diaphoresis. He gets dizziness upon  sitting up as if he is spinning, lightheadedness and weakness. He is slightly nauseated. Denies vomiting or fevers. Denies extremity numbness, weakness or tingling.      Problem List  Past Medical History  Diagnosis Date  . Anxiety   . Hypertension   . Low back pain   . Tobacco user   . CAD (coronary artery disease)     a. s/p CABG 2009. b. Cath 07/2013: 3/5 patent grafts (appear to have chronic occ grafts), mod diag/L PL branch, did not appear flow limiting, elevated filling pressures.  . Hyperlipidemia   . S/P colonoscopy   . Myocardial infarction   . Arthritis   . Paroxysmal atrial flutter 07-2012    a. s/p ablation by Dr Graciela Husbands 08-06-2012. b. Recurrence 07/2013.   Marland Kitchen Chicken pox as a child  . Measles as a child  . Mumps as a child  . Abnormal liver function 08/23/2010  . Chronic combined systolic and diastolic CHF (congestive heart failure)     a. Echo 6/14: Mild LVH, EF 30-35%, diffuse HK, MAC, mild BAE. b. Drop in EF to 20% by echo 07/2013.  . Diabetes mellitus   . PAF (paroxysmal atrial fibrillation)   . LBBB (left bundle branch block)     Past Surgical History  Procedure Laterality Date  . Shoulder surgery    . Coronary artery bypass graft  2009    x 5  . Tee without cardioversion N/A 07/15/2012    Procedure: TRANSESOPHAGEAL ECHOCARDIOGRAM (TEE);  Surgeon: Vesta Mixer, MD;  Location: [facility] ENDOSCOPY;  Service: Cardiovascular;  Laterality: N/A;  . Cardioversion N/A 07/15/2012    Procedure: CARDIOVERSION;  Surgeon: Vesta Mixer, MD;  Location: [facility] ENDOSCOPY;  Service: Cardiovascular;  Laterality: N/A;  . Ablation of dysrhythmic focus  08/06/2012    CTI ablation by Dr Graciela Husbands  . Tonsillectomy    . Cardiac catheterization    . Atrial flutter ablation N/A 08/06/2012    Procedure: ATRIAL FLUTTER ABLATION;  Surgeon: Duke Salvia, MD;  Location[facility] C[facility] LAB;  Service: Cardiovascular;  Laterality: N/A;  . Left heart catheterization with coronary angiogram N/A 08/01/2013     Procedure: LEFT HEART CATHETERIZATION WITH CORONARY ANGIOGRAM;  Surgeon: Kathleene Hazel, MD;  Location: [facility] CATH LAB;  Service: Cardiovascular;  Laterality: N/A;  . Implantable cardioverter defibrillator implant N/A 11/10/2013    Procedure: SUB Q IMPLANTABLE CARDIOVERTER DEFIBRILLATOR IMPLANT;  Surgeon: Duke Salvia, MD;  Location: [facility] CATH LAB;  Service: Cardiovascular;  Laterality: N/A;  . Pocket revision N/A 12/16/2013    Procedure: POCKET REVISION;  Surgeon: Marinus Maw, MD;  Location: [facility] CATH LAB;  Service: Cardiovascular;  Laterality: N/A;     Allergies  No Known Allergies   Home Medications  Prior to Admission medications   Medication Sig Start Date End Date Taking? Authorizing Provider  atorvastatin (LIPITOR) 20 MG tablet Take 1 tablet (20 mg total) by mouth daily. 01/25/14  Yes Bradd Canary, MD  busPIRone (BUSPAR) 10 MG tablet Take 10 mg by mouth 2 (two) times daily.   Yes Historical Provider, MD  carvedilol (COREG) 12.5 MG tablet Take 1 tablet (12.5 mg total) by mouth 2 (two) times daily with a meal. 08/04/13  Yes Azalee Course, PA  clonazePAM (KLONOPIN) 0.5 MG tablet TAKE 1 TABLET BY MOUTH 3 TIMES A DAY 01/18/14  Yes Bradd Canary, MD  clonazePAM (KLONOPIN) 0.5 MG tablet Take 0.5 mg by mouth 2 (two) times daily.   Yes Historical Provider, MD  furosemide (LASIX) 40 MG tablet TAKE 1 TABLET BY MOUTH DAILY. IF WEIGHT INCREASED > 3LBS OVERNIGHT INCREASE BY ONE TABLET 01/19/14  Yes Duke Salvia, MD  glipiZIDE (GLUCOTROL) 2.5 mg TABS tablet Take 0.5 tablets (2.5 mg total) by mouth daily before breakfast. 08/04/13  Yes Azalee Course, PA  glipiZIDE (GLUCOTROL) 5 MG tablet Take 2.5 mg by mouth daily before breakfast.   Yes Historical Provider, MD  irbesartan (AVAPRO) 300 MG tablet Take 1 tablet (300 mg total) by mouth daily. 08/04/13  Yes Azalee Course, PA  metFORMIN (GLUCOPHAGE) 500 MG tablet Take 1 tablet (500 mg total) by mouth 2 (two) times daily with a meal. 08/04/13  Yes Azalee Course, PA  OVER  THE COUNTER MEDICATION Place 1 drop into both eyes as needed (for dry eyes). "OTC Good Sense Eye Drops"   Yes Historical Provider, MD  rivaroxaban (XARELTO) 20 MG TABS tablet Take 1 tablet (20 mg total) by mouth daily with supper. 08/04/13  Yes Azalee Course, PA  spironolactone (ALDACTONE) 25 MG tablet Take 1 tablet (25 mg total) by mouth daily. 08/15/13  Yes Dayna N Dunn, PA-C  Hydrocodone-Acetaminophen (VICODIN) 5-300 MG TABS Take 1 tablet by mouth every 6 (six) hours as needed. Patient not taking: Reported on 02/13/2014 12/17/13   Abelino Derrick, PA-C    Family History  Family History  Problem Relation Age of Onset  . Alzheimer's disease Mother   . Heart failure Father 72  . Hypertension Father   . Hyperlipidemia Father   .  Cancer Father     lung- took half of a young- smoker  . Heart disease Father     MI at 26  . Early death Neg Hx   . Kidney disease Neg Hx   . Stroke Neg Hx   . Leukemia Brother   . Drug abuse Daughter     heroine   Family Status  Relation Status Death Age  . Mother Deceased 60  . Father Deceased 57  . Brother Deceased 3  . Daughter Alive     21- smokes   . Daughter Alive     40/ healthy  . Son Alive     64- smoker  . Maternal Grandmother Deceased   . Maternal Grandfather Deceased 64's  . Paternal Grandmother Deceased   . Paternal Grandfather Deceased 108's  . Son Alive     28/ healthy  . Son Alive     30/ healthy     Social History  History   Social History  . Marital Status: Married    Spouse Name: N/A    Number of Children: N/A  . Years of Education: N/A   Occupational History  . Not on file.   Social History Main Topics  . Smoking status: Former Smoker -- 1.00 packs/day for 40 years    Types: Cigarettes    Quit date: 02/01/2011  . Smokeless tobacco: Never Used  . Alcohol Use: 14.4 oz/week    24 Glasses of wine per week     Comment: drinks 2 bottles of wine per week  . Drug Use: No  . Sexual Activity: Yes   Other Topics Concern  .  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   He has been married four times previously, the first  For 12yrs and has two children by the marriage which are grown, the second time for 11 yrs and has 2 by that marriage.   Wife has custody in [place].   The third marriage was 4 1/2 yrs and most recent marriage was the past eight weeks and he is currently estranged from that person.     All other systems reviewed and are otherwise negative except as noted above.  Physical Exam  Blood pressure 136/71, pulse 78, temperature 98 F (36.7 C), temperature source Oral, resp. rate 18, height  (1.88 m), weight 212 lb (96.163 kg), SpO2 98 %.  General: Pleasant, NAD Psych: Normal affect. Neuro: Alert and oriented X 3. Moves all extremities spontaneously. HEENT: Normal  Neck: Supple without bruits or JVD. Lungs:  Resp regular and unlabored, CTA. Heart: RRR no s3, s4, or murmurs. Abdomen: Soft, non-tender, non-distended, BS + x 4.  Extremities: No clubbing, cyanosis or edema. DP/PT/Radials 2+ and equal bilaterally.  Labs  No results for input(s): CKTOTAL, CKMB, TROPONINI in the last 72 hours. Lab Results  Component Value Date   WBC 10.4 02/13/2014   HGB 13.0 02/13/2014   HCT 36.9* 02/13/2014   MCV 89.3 02/13/2014   PLT 233 02/13/2014   No results for input(s): NA, K, CL, CO2, BUN, CREATININE, CALCIUM, PROT, BILITOT, ALKPHOS, ALT, AST, GLUCOSE in the last 168 hours.  Invalid input(s): LABALBU Lab Results  Component Value Date   CHOL 131 11/29/2013   HDL 40.50 11/29/2013   LDLCALC 53 11/29/2013   TRIG 190.0* 11/29/2013   No results  found for: DDIMER   Radiology/Studies  Dg Chest 2 View  02/13/2014   CLINICAL DATA:  Syncope and dizziness this morning.  EXAM: CHEST  2 VIEW  COMPARISON:  PA and lateral chest 08/03/2013.  FINDINGS: Single lead AICD  is in place. The patient is status post CABG. Lungs are clear. Heart size is normal. No pneumothorax or pleural effusion.  IMPRESSION: No acute disease.   Electronically Signed   By: Drusilla Kanner M.D.   On: 02/13/2014 15:05   Ct Head Wo Contrast  02/13/2014   CLINICAL DATA:  Loss of consciousness and fall. Shortness of breath.  EXAM: CT HEAD WITHOUT CONTRAST  TECHNIQUE: Contiguous axial images were obtained from the base of the skull through the vertex without intravenous contrast.  COMPARISON:  None.  FINDINGS: Skull and Sinuses:Mild, scattered inflammatory mucosal thickening in the paranasal sinuses. No sinus effusion. No fracture or destructive process.  Orbits: No acute abnormality.  Brain: No evidence of acute infarction, hemorrhage, hydrocephalus, or mass lesion/mass effect. There is cortical volume loss which is mild to moderate for age and most prominent in the biparietal region.  IMPRESSION: No acute intracranial disease or fracture.   Electronically Signed   By: Tiburcio Pea M.D.   On: 02/13/2014 13:22    2D ECHO  Study Date: 09/20/2013 LV EF: 15% Study Conclusions - Left ventricle: The cavity size was severely dilated. Wall   thickness was increased in a pattern of mild LVH. The estimated   ejection fraction was 15%. Diffuse hypokinesis. Doppler   parameters are consistent with abnormal left ventricular   relaxation (grade 1 diastolic dysfunction). - Mitral valve: Calcified annulus. There was mild regurgitation. - Left atrium: The atrium was mildly dilated. - Right ventricle: Systolic function was moderately reduced. Impressions: - Severe global reduction in LV function; grade 1 diastolic   dysfunction; mild LAE; mild MR; moderately reduced RV function.   Compared to 08/03/13, LV function remains severely reduced;   diastolic dysfunction now grade 1.   ECG  Normal sinus rhythm Incomplete left bundle branch block ST & T wave abnormality, consider lateral  ischemia  ASSESSMENT AND PLAN  Larry Rangel is a 64 y.o. male with a history of DM, CAD s/p CABG 2009, dyslipidemia, HTN, LBBB, atrial flutter s/p ablation 2014, recurrence 07/2013, ICM s/p ICD placement c/b hematoma and pocket revision, chronic mixed S/D CHF who presented to [facility] ED today with syncope.   Syncope-  non-witnessed syncopal episode around 8:30 AM today. Patient reports he was walking down the hallway to find his wife and the next thing he knew was that he woke up on the hardwood floor. Unknown how long he had last consciousness. No prodromal sx. He woke up without feeling groggy or dizzy. However, has been generally feeling dizzy over the last couple days. -- CT non acute -- Orthostatics in ED: BP 145/81 HR 76 (lying). BP 128/90 HR 92 (sitting). BP 120/87 HR 120 (standing) -- Very little prodrome and short recovery worrisome for arrhythmogenic syncope, however, ICD interrogated with no events.   -- Hold lasix and spiro for now. Will give light IVFs. Observe overnight and have Dr. Graciela Husbands see in the AM.  Chronic combined systolic/diastolic CHF - CXR clear and BNP normal -- EF 15% Severe global reduction in LV function; G1DD, mild LAE; mild MR; moderately reduced RV function. Compared to 08/03/13, LV function remains severely reduced; diastolic dysfunction now grade 1. (09/2013) -- S/p ICD placement in 11/2013 c/b hematoma and pocket  revision in 12/2013 -- Continue coreg 12.5 mg BID, irbesartan  and spironolactone  qd.   CAD s/p CABG 2009, cath 07/2013 with 3/5 grafts patent (2 chronically occluded) -- Troponin negative. Continue to cycle.  Paroxysmal atrial fibrillation/flutter, s/p flutter ablation 2014, currently in NSR  -- Continue Xarelto  LBBB- chronic  HTN- BP well controlled   DM- SSI    Signed, Janetta Hora, PA-C 02/13/2014, 3:21 PM  Pager [phone]   I have examined the patient and reviewed assessment and plan and discussed with patient.  Agree  with above as stated.  Unclear etiology of syncope.  He is orthostatic by BP.  Hold diuretics and give gentle rehydration.  Reassess in AM.  Perhaps he needs less home diuretic.  No arrhythmia noted on device interrogation.  Amond Speranza S.

## 2014-02-13 NOTE — ED Provider Notes (Signed)
  Face-to-face evaluation   History: Here for evaluation of syncope.  He has had ongoing dizziness for several days, associated with urinary frequency.  He is taking his usual medications.  He denies injury from fall.  Physical exam: Alert, calm, cooperative.  Heart regular rate and rhythm, no murmur.  Lungs clear to auscultation.  Extremities no edema.  Medical screening examination/treatment/procedure(s) were conducted as a shared visit with non-physician practitioner(s) and myself.  I personally evaluated the patient during the encounter  Flint Melter, MD 02/13/14 1729

## 2014-02-13 NOTE — ED Notes (Signed)
Called flow rt bed placement.

## 2014-02-13 NOTE — ED Notes (Signed)
Pt in xray

## 2014-02-13 NOTE — ED Notes (Signed)
Checked patient cg it wasd 138 notifie Rn of blood sugar

## 2014-02-14 DIAGNOSIS — R079 Chest pain, unspecified: Secondary | ICD-10-CM | POA: Diagnosis not present

## 2014-02-14 DIAGNOSIS — I2510 Atherosclerotic heart disease of native coronary artery without angina pectoris: Secondary | ICD-10-CM | POA: Diagnosis not present

## 2014-02-14 DIAGNOSIS — E785 Hyperlipidemia, unspecified: Secondary | ICD-10-CM | POA: Diagnosis not present

## 2014-02-14 DIAGNOSIS — R42 Dizziness and giddiness: Secondary | ICD-10-CM | POA: Diagnosis not present

## 2014-02-14 DIAGNOSIS — F419 Anxiety disorder, unspecified: Secondary | ICD-10-CM | POA: Diagnosis not present

## 2014-02-14 DIAGNOSIS — I10 Essential (primary) hypertension: Secondary | ICD-10-CM | POA: Diagnosis not present

## 2014-02-14 DIAGNOSIS — I5042 Chronic combined systolic (congestive) and diastolic (congestive) heart failure: Secondary | ICD-10-CM | POA: Diagnosis not present

## 2014-02-14 DIAGNOSIS — R55 Syncope and collapse: Secondary | ICD-10-CM | POA: Diagnosis not present

## 2014-02-14 DIAGNOSIS — R0602 Shortness of breath: Secondary | ICD-10-CM | POA: Diagnosis not present

## 2014-02-14 LAB — GLUCOSE, CAPILLARY
Glucose-Capillary: 118 mg/dL — ABNORMAL HIGH (ref 70–99)
Glucose-Capillary: 112 mg/dL — ABNORMAL HIGH (ref 70–99)

## 2014-02-14 LAB — CBC
HCT: 34.5 % — ABNORMAL LOW (ref 39.0–52.0)
Hemoglobin: 11.9 g/dL — ABNORMAL LOW (ref 13.0–17.0)
MCH: 31.4 pg (ref 26.0–34.0)
MCHC: 34.5 g/dL (ref 30.0–36.0)
MCV: 91 fL (ref 78.0–100.0)
Platelets: 155 10*3/uL (ref 150–400)
RBC: 3.79 MIL/uL — ABNORMAL LOW (ref 4.22–5.81)
RDW: 12.6 % (ref 11.5–15.5)
WBC: 8 10*3/uL (ref 4.0–10.5)

## 2014-02-14 LAB — PROTIME-INR
INR: 2.88 — ABNORMAL HIGH (ref 0.00–1.49)
Prothrombin Time: 30.4 seconds — ABNORMAL HIGH (ref 11.6–15.2)

## 2014-02-14 LAB — BASIC METABOLIC PANEL
Anion gap: 9 (ref 5–15)
BUN: 32 mg/dL — ABNORMAL HIGH (ref 6–23)
CALCIUM: 9.1 mg/dL (ref 8.4–10.5)
CO2: 19 mmol/L (ref 19–32)
Chloride: 104 mmol/L (ref 96–112)
Creatinine, Ser: 1.53 mg/dL — ABNORMAL HIGH (ref 0.50–1.35)
GFR calc Af Amer: 53 mL/min — ABNORMAL LOW (ref >90)
GFR, EST NON AFRICAN AMERICAN: 46 mL/min — AB (ref >90)
GLUCOSE: 121 mg/dL — AB (ref 70–99)
Potassium: 5.2 mmol/L — ABNORMAL HIGH (ref 3.5–5.1)
Sodium: 132 mmol/L — ABNORMAL LOW (ref 135–145)

## 2014-02-14 LAB — LIPID PANEL
CHOL/HDL RATIO: 2.8 ratio
Cholesterol: 123 mg/dL (ref 0–200)
HDL: 44 mg/dL (ref >39)
LDL Cholesterol: 59 mg/dL (ref 0–99)
Triglycerides: 99 mg/dL (ref <150)
VLDL: 20 mg/dL (ref 0–40)

## 2014-02-14 LAB — URINE CULTURE
COLONY COUNT: NO GROWTH
CULTURE: NO GROWTH
Special Requests: NORMAL

## 2014-02-14 LAB — TROPONIN I
Troponin I: 0.11 ng/mL — ABNORMAL HIGH (ref <0.031)
Troponin I: 0.12 ng/mL — ABNORMAL HIGH (ref <0.031)

## 2014-02-14 MED ORDER — SPIRONOLACTONE 25 MG PO TABS: 12.5000 mg | ORAL_TABLET | Freq: Every day | ORAL | Status: DC

## 2014-02-14 MED ORDER — IRBESARTAN 150 MG PO TABS: 150.0000 mg | ORAL_TABLET | Freq: Every day | ORAL | Status: DC

## 2014-02-14 NOTE — Consult Note (Signed)
ELECTROPHYSIOLOGY CONSULT NOTE    Patient ID: Larry Rangel MRN: [id number], DOB/AGE: May 02, 1950 64 y.o.  Admit date: 02/13/2014 Date of Consult: 02/14/2014  Primary Physician: Danise Edge, MD Primary Cardiologist: Graciela Husbands  Reason for Consultation: syncope  HPI:  ODAY Rangel is a 64 y.o. male with a past medical history significant for CAD s/p CABG, dyslipidemia, hypertension, atrial flutter s/p ablation 2014, paroxysmal atrial fibrillation, ischemic cardiomyopathy (s/p SICD placement 10/2013).  He presented to the ER 02/13/14 for evaluation of syncope.  He was walking down the hallway at home and had a sudden loss of consciousness without prodrome.  He awoke on the floor and was able to stand without symptoms.  Two days prior to admission, he had 24 hours of fevers and chills but no nausea or vomiting.    He has occasional orthostatic dizziness.  Orthostatics in the ER demonstrated SBP drop from 145 to 120 with standing.    Lab work is notable for creat of 1.53 (baseline 1.0), trop 0.12, TSH 0.276.    Device interrogation demonstrated no arrhythmias in programmed zones (210 and 240bpm).  Battery status normal.   EP has been asked to evaluate for treatment options.   Past Medical History  Diagnosis Date  . Anxiety   . Hypertension   . Low back pain   . Tobacco user   . CAD (coronary artery disease)     a. s/p CABG 2009. b. Cath 07/2013: 3/5 patent grafts (appear to have chronic occ grafts), mod diag/L PL branch, did not appear flow limiting, elevated filling pressures.  . Hyperlipidemia   . S/P colonoscopy   . Myocardial infarction   . Arthritis   . Paroxysmal atrial flutter 07-2012    a. s/p ablation by Dr Graciela Husbands 08-06-2012. b. Recurrence 07/2013.   Marland Kitchen Chicken pox as a child  . Measles as a child  . Mumps as a child  . Abnormal liver function 08/23/2010  . Chronic combined systolic and diastolic CHF (congestive heart failure)     a. Echo 6/14: Mild LVH, EF 30-35%, diffuse  HK, MAC, mild BAE. b. Drop in EF to 20% by echo 07/2013.  . Diabetes mellitus   . PAF (paroxysmal atrial fibrillation)   . LBBB (left bundle branch block)      Surgical History:  Past Surgical History  Procedure Laterality Date  . Shoulder surgery    . Coronary artery bypass graft  2009    x 5  . Tee without cardioversion N/A 07/15/2012    Procedure: TRANSESOPHAGEAL ECHOCARDIOGRAM (TEE);  Surgeon: Vesta Mixer, MD;  Location: [facility] ENDOSCOPY;  Service: Cardiovascular;  Laterality: N/A;  . Cardioversion N/A 07/15/2012    Procedure: CARDIOVERSION;  Surgeon: Vesta Mixer, MD;  Location: [facility] ENDOSCOPY;  Service: Cardiovascular;  Laterality: N/A;  . Ablation of dysrhythmic focus  08/06/2012    CTI ablation by Dr Graciela Husbands  . Tonsillectomy    . Cardiac catheterization    . Atrial flutter ablation N/A 08/06/2012    Procedure: ATRIAL FLUTTER ABLATION;  Surgeon: Duke Salvia, MD;  Location: [facility] CATH LAB;  Service: Cardiovascular;  Laterality: N/A;  . Left heart catheterization with coronary angiogram N/A 08/01/2013    Procedure: LEFT HEART CATHETERIZATION WITH CORONARY ANGIOGRAM;  Surgeon: Kathleene Hazel, MD;  Location: [facility] CATH LAB;  Service: Cardiovascular;  Laterality: N/A;  . Implantable cardioverter defibrillator implant N/A 11/10/2013    Procedure: SUB Q IMPLANTABLE CARDIOVERTER DEFIBRILLATOR IMPLANT;  Surgeon: Duke Salvia, MD;  Location: [facility] CATH LAB;  Service: Cardiovascular;  Laterality: N/A;  . Pocket revision N/A 12/16/2013    Procedure: POCKET REVISION;  Surgeon: Marinus Maw, MD;  Location: [facility] CATH LAB;  Service: Cardiovascular;  Laterality: N/A;     Prescriptions prior to admission  Medication Sig Dispense Refill Last Dose  . atorvastatin (LIPITOR) 20 MG tablet Take 1 tablet (20 mg total) by mouth daily. 90 tablet 1 02/13/2014 at Unknown time  . busPIRone (BUSPAR) 10 MG tablet Take 10 mg by mouth 2 (two) times daily.   02/13/2014 at Unknown time  . carvedilol (COREG) 12.5 MG  tablet Take 1 tablet (12.5 mg total) by mouth 2 (two) times daily with a meal. 60 tablet 12 02/13/2014 at 0630  . clonazePAM (KLONOPIN) 0.5 MG tablet TAKE 1 TABLET BY MOUTH 3 TIMES A DAY 90 tablet 0 02/13/2014 at Unknown time  . clonazePAM (KLONOPIN) 0.5 MG tablet Take 0.5 mg by mouth 2 (two) times daily.   02/13/2014 at Unknown time  . furosemide (LASIX) 40 MG tablet TAKE 1 TABLET BY MOUTH DAILY. IF WEIGHT INCREASED > 3LBS OVERNIGHT INCREASE BY ONE TABLET 45 tablet 3 02/13/2014 at Unknown time  . glipiZIDE (GLUCOTROL) 2.5 mg TABS tablet Take 0.5 tablets (2.5 mg total) by mouth daily before breakfast. 30 tablet 12 02/13/2014 at Unknown time  . glipiZIDE (GLUCOTROL) 5 MG tablet Take 2.5 mg by mouth daily before breakfast.   02/13/2014 at Unknown time  . irbesartan (AVAPRO) 300 MG tablet Take 1 tablet (300 mg total) by mouth daily. 30 tablet 12 02/13/2014 at Unknown time  . metFORMIN (GLUCOPHAGE) 500 MG tablet Take 1 tablet (500 mg total) by mouth 2 (two) times daily with a meal. 60 tablet 6 02/13/2014 at Unknown time  . OVER THE COUNTER MEDICATION Place 1 drop into both eyes as needed (for dry eyes). "OTC Good Sense Eye Drops"   02/12/2014 at Unknown time  . rivaroxaban (XARELTO) 20 MG TABS tablet Take 1 tablet (20 mg total) by mouth daily with supper. 30 tablet 12 02/12/2014 at Unknown time  . spironolactone (ALDACTONE) 25 MG tablet Take 1 tablet (25 mg total) by mouth daily. 30 tablet 6 02/13/2014 at Unknown time  . Hydrocodone-Acetaminophen (VICODIN) 5-300 MG TABS Take 1 tablet by mouth every 6 (six) hours as needed. (Patient not taking: Reported on 02/13/2014) 10 each 0 Not Taking at Unknown time    Inpatient Medications:  . aspirin EC  81 mg Oral Daily  . atorvastatin  20 mg Oral Daily  . busPIRone  10 mg Oral BID  . carvedilol  12.5 mg Oral BID WC  . clonazePAM  0.5 mg Oral BID  . irbesartan  300 mg Oral Daily  . rivaroxaban  20 mg Oral Q supper  . sodium chloride  3 mL Intravenous Q12H    Allergies: No  Known Allergies  History   Social History  . Marital Status: Married    Spouse Name: N/A    Number of Children: N/A  . Years of Education: N/A   Occupational History  . Not on file.   Social History Main Topics  . Smoking status: Former Smoker -- 1.00 packs/day for 40 years    Types: Cigarettes    Quit date: 02/01/2011  . Smokeless tobacco: Never Used  . Alcohol Use: 14.4 oz/week    24 Glasses of wine per week     Comment: drinks 2 bottles of wine per week  . Drug Use: No  .  Sexual Activity: Yes   Other Topics Concern  . 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   He has been married four times previously, the first  For 76yrs and has two children by the marriage which are grown, the second time for 11 yrs and has 2 by that marriage.   Wife has custody in [place].   The third marriage was 4 1/2 yrs and most recent marriage was the past eight weeks and he is currently estranged from that person.     Family History  Problem Relation Age of Onset  . Alzheimer's disease Mother   . Heart failure Father 39  . Hypertension Father   . Hyperlipidemia Father   . Cancer Father     lung- took half of a young- smoker  . Heart disease Father     MI at 42  . Early death Neg Hx   . Kidney disease Neg Hx   . Stroke Neg Hx   . Leukemia Brother   . Drug abuse Daughter     heroine    BP 142/81 mmHg  Pulse 67  Temp(Src) 97.6 F (36.4 C) (Oral)  Resp 18  Ht 6\' 2"  (1.88 m)  Wt 221 lb 9.6 oz (100.517 kg)  BMI 28.44 kg/m2  SpO2 100%  Physical Exam: Well appearing 64 yo man, NAD HEENT: Unremarkable,[place], AT Neck:  6 JVD, no thyromegally Back:  No CVA tenderness Lungs:  Clear with no wheezes, rales, or rhonchi HEART:  Regular rate rhythm, no murmurs, no rubs, no clicks Abd:  soft, positive bowel sounds, no organomegally, no rebound, no  guarding, well healed ICD incision Ext:  2 plus pulses, no edema, no cyanosis, no clubbing Skin:  No rashes no nodules Neuro:  CN II through XII intact, motor grossly intact   Labs:   Lab Results  Component Value Date   WBC 8.0 02/14/2014   HGB 11.9* 02/14/2014   HCT 34.5* 02/14/2014   MCV 91.0 02/14/2014   PLT 155 02/14/2014    Recent Labs Lab 02/14/14 0715  NA 132*  K 5.2*  CL 104  CO2 19  BUN 32*  CREATININE 1.53*  CALCIUM 9.1  GLUCOSE 121*     Radiology/Studies: Dg Chest 2 View 02/13/2014   CLINICAL DATA:  Syncope and dizziness this morning.  EXAM: CHEST  2 VIEW  COMPARISON:  PA and lateral chest 08/03/2013.  FINDINGS: Single lead AICD is in place. The patient is status post CABG. Lungs are clear. Heart size is normal. No pneumothorax or pleural effusion.  IMPRESSION: No acute disease.   Electronically Signed   By: Drusilla Kanner M.D.   On: 02/13/2014 15:05   Ct Head Wo Contrast 02/13/2014   CLINICAL DATA:  Loss of consciousness and fall. Shortness of breath.  EXAM: CT HEAD WITHOUT CONTRAST  TECHNIQUE: Contiguous axial images were obtained from the base of the skull through the vertex without intravenous contrast.  COMPARISON:  None.  FINDINGS: Skull and Sinuses:Mild, scattered inflammatory mucosal thickening in the paranasal sinuses. No sinus effusion. No fracture or destructive process.  Orbits: No acute abnormality.  Brain: No evidence of acute infarction, hemorrhage, hydrocephalus, or mass lesion/mass effect. There is cortical volume loss which is mild to moderate for age and most prominent in the biparietal region.  IMPRESSION: No acute intracranial disease or fracture.  Electronically Signed   By: Tiburcio Pea M.D.   On: 02/13/2014 13:22    EKG: NSR with ILBBB  TELEMETRY: sinus rhythm  DEVICE HISTORY: S-ICD implanted 10/2013 by Dr Graciela Husbands for primary prevention  A/P 1. Syncope, etiology unknown 2. Ischemic CM, s/p MI, s/p CABG 3. ILBBB 4. PAF 5. H/o atrial  flutter s/p ablation Rec: Multiple possibilities exist for the etiology of the patient's syncope. He may well have had VT below the device detection window which stopped spontaneously. He could have had complete heart block although his QRS duration is 110 (ILBBB). He could have had orthostasis but his history of a sudden episode makes this seem less likely. I will discuss turning down the detection interval with his primary EP and will plan to send him home today. He and his spouse have been told that he is not supposed to drive for 6 months.   Leonia Reeves.D.

## 2014-02-14 NOTE — Discharge Summary (Signed)
ELECTROPHYSIOLOGY  DISCHARGE SUMMARY    Patient ID: Larry Rangel,  MRN: [id number], DOB/AGE: [birth year] 64 y.o.  Admit date: 02/13/2014 Discharge date: 02/14/2014  Primary Care Physician: Danise Edge, MD Primary Cardiologist: Graciela Husbands  Primary Discharge Diagnosis:  Syncope  Secondary Discharge Diagnosis:  1.  CAD s/p CABG 2.  ICM s/p S-ICD implant 10/2013 3.  Atrial flutter status post ablation 4.  Atrial fibrillation 5.  Hyperlipidemia 6.  Hypertension   No Known Allergies   Brief HPI/Hospital Course:  Larry Rangel is a 64 y.o. male with a past medical history as outlined above. On the day of admission, he was walking down the hallway at home when he had a sudden unwitnessed syncopal spell without prodrome.  Upon awakening, he had no residual symptoms.  Two days before this episode, he had 24 hours of fevers and chills but no other symptoms.  He presented to the ER for further evaluation after his syncopal spell.  Lab work is notable for creatinine of 1.83 (improved to 1.5 after hydration), trop of 0.12, TSH of 0.276.  He was monitored on telemetry overnight with no arrhythmias.  Device was interrogated and found to be functioning normally with no arrhythmias identified.  His ICD is set for dual zones at 210 and 240 bpm.  There is no monitor zone available on this device and no histograms.  Device was reprogrammed to initial zone of 180bpm and 2nd zone at 240bpm prior to discharge.   Dr Ladona Ridgel examined the patient on 02-14-14 and considered him stable for discharge to home.  Due to renal insufficiency, his Avapro and Spironolactone will be decreased to 1/2 home dose.  He will continue lasix at discharge.  He has been advised to follow up with his PCP regarding his TSH after discharge. He will have follow up with Talitha Givens, NP and Dr Graciela Husbands on Monday 02/20/14 with repeat BMET at that time.  He has been advised that he should not drive for 6 months following syncopal spell.    He had no chest pain or other anginal symptoms and his troponin was felt to be not clinically significant (in context of syncope and possible arrhythmia and flat trend).   Discharge Vitals: Blood pressure 142/81, pulse 67, temperature 97.6 F (36.4 C), temperature source Oral, resp. rate 18, height  (1.88 m), weight 221 lb 9.6 oz (100.517 kg), SpO2 100 %.    Labs:   Lab Results  Component Value Date   WBC 8.0 02/14/2014   HGB 11.9* 02/14/2014   HCT 34.5* 02/14/2014   MCV 91.0 02/14/2014   PLT 155 02/14/2014    Recent Labs Lab 02/14/14 0715  NA 132*  K 5.2*  CL 104  CO2 19  BUN 32*  CREATININE 1.53*  CALCIUM 9.1  GLUCOSE 121*    Discharge Medications:    Medication List    ASK your doctor about these medications        atorvastatin 20 MG tablet  Commonly known as:  LIPITOR  Take 1 tablet (20 mg total) by mouth daily.     busPIRone 10 MG tablet  Commonly known as:  BUSPAR  Take 10 mg by mouth 2 (two) times daily.     carvedilol 12.5 MG tablet  Commonly known as:  COREG  Take 1 tablet (12.5 mg total) by mouth 2 (two) times daily with a meal.     clonazePAM 0.5 MG tablet  Commonly known as:  KLONOPIN  Take  0.5 mg by mouth 2 (two) times daily.     clonazePAM 0.5 MG tablet  Commonly known as:  KLONOPIN  TAKE 1 TABLET BY MOUTH 3 TIMES A DAY     furosemide 40 MG tablet  Commonly known as:  LASIX  TAKE 1 TABLET BY MOUTH DAILY. IF WEIGHT INCREASED > 3LBS OVERNIGHT INCREASE BY ONE TABLET     glipiZIDE 5 MG tablet  Commonly known as:  GLUCOTROL  Take 2.5 mg by mouth daily before breakfast.     glipiZIDE 2.5 mg Tabs tablet  Commonly known as:  GLUCOTROL  Take 0.5 tablets (2.5 mg total) by mouth daily before breakfast.     Hydrocodone-Acetaminophen 5-300 MG Tabs  Commonly known as:  VICODIN  Take 1 tablet by mouth every 6 (six) hours as needed.     irbesartan 300 MG tablet  Commonly known as:  AVAPRO  Take 1 tablet (300 mg total) by mouth daily.      metFORMIN 500 MG tablet  Commonly known as:  GLUCOPHAGE  Take 1 tablet (500 mg total) by mouth 2 (two) times daily with a meal.     OVER THE COUNTER MEDICATION  Place 1 drop into both eyes as needed (for dry eyes). "OTC Good Sense Eye Drops"     rivaroxaban 20 MG Tabs tablet  Commonly known as:  XARELTO  Take 1 tablet (20 mg total) by mouth daily with supper.     spironolactone 25 MG tablet  Commonly known as:  ALDACTONE  Take 1 tablet (25 mg total) by mouth daily.        Disposition:     Duration of Discharge Encounter: Greater than 30 minutes including physician time.  Signed,  Leonia Reeves.D.

## 2014-02-15 ENCOUNTER — Telehealth: Payer: Self-pay

## 2014-02-15 LAB — HEMOGLOBIN A1C
Hgb A1c MFr Bld: 5.6 % (ref 4.8–5.6)
MEAN PLASMA GLUCOSE: 114 mg/dL

## 2014-02-15 NOTE — Telephone Encounter (Signed)
Admit date: 02/13/2014  Discharge date: 02/14/2014   Reason for admission: Syncope   Recommendations:  He will continue lasix at discharge. He has been advised to follow up with his PCP regarding his TSH after discharge. He will have follow up with Talitha Givens, NP and Dr Graciela Husbands on Monday 02/20/14 with repeat BMET at that time. He has been advised that he should not drive for 6 months following syncopal spell.    Transition Care Management Follow-up Telephone Call  How have you been since you were released from the hospital?  Pt states he feels weak and slightly dizzy, but has improved some since discharge.  He worked a 4 hour work day and was able tolerated it, but states it was quite exhausting.  Pt was advised not to drive, but he drove himself to work today stating that works pretty close to his home.     Do you understand why you were in the hospital? yes   Do you understand the discharge instructions? yes, but instructions were again reviewed with patient.   Items Reviewed:  Medications reviewed: yes  Allergies reviewed: yes  Dietary changes reviewed: yes  Referrals reviewed: yes, cardiology appointments scheduled.   Functional Questionnaire:   Activities of Daily Living (ADLs):   He states they are independent in the following: ambulation, bathing and hygiene, feeding, continence, grooming, toileting and dressing States they require assistance with the following: wife assist as needed   Any transportation issues/concerns?: no; other than patient is not to drive x .  He is aware.  He was strongly advised to have his wife drive especially to doctor's appointments.     Any patient concerns? no   Confirmed importance and date/time of follow-up visits scheduled: yes   Confirmed with patient if condition begins to worsen call PCP or go to the ER: yes    Hospital follow up appointment scheduled with Dr. Abner Greenspan on 02/23/14 at 9:30 am.

## 2014-02-15 NOTE — Telephone Encounter (Signed)
Admit date: 02/13/2014 Discharge date: 02/14/2014  Reason for admission:  Syncope  Recommendations: He will continue lasix at discharge. He has been advised to follow up with his PCP regarding his TSH after discharge. He will have follow up with Talitha Givens, NP and Dr Graciela Husbands on Monday 02/20/14 with repeat BMET at that time. He has been advised that he should not drive for 6 months following syncopal spell.    Unable to reach patient.  Left a message for call back.

## 2014-02-15 NOTE — Telephone Encounter (Signed)
Patient returned phone call and appointment scheduled for 02/23/14

## 2014-02-20 ENCOUNTER — Encounter: Payer: Self-pay | Admitting: Nurse Practitioner

## 2014-02-20 ENCOUNTER — Ambulatory Visit (INDEPENDENT_AMBULATORY_CARE_PROVIDER_SITE_OTHER): Payer: Medicare HMO | Admitting: Nurse Practitioner

## 2014-02-20 VITALS — BP 90/60 | HR 80 | Ht 74.0 in | Wt 211.2 lb

## 2014-02-20 DIAGNOSIS — I255 Ischemic cardiomyopathy: Secondary | ICD-10-CM

## 2014-02-20 LAB — BASIC METABOLIC PANEL
BUN: 24 mg/dL — ABNORMAL HIGH (ref 6–23)
CO2: 24 mEq/L (ref 19–32)
Calcium: 9.9 mg/dL (ref 8.4–10.5)
Chloride: 104 mEq/L (ref 96–112)
Creatinine, Ser: 1.47 mg/dL (ref 0.40–1.50)
GFR: 50.97 mL/min — ABNORMAL LOW (ref >60.00)
Glucose, Bld: 88 mg/dL (ref 70–99)
Potassium: 4.9 mEq/L (ref 3.5–5.1)
Sodium: 138 mEq/L (ref 135–145)

## 2014-02-20 NOTE — Patient Instructions (Signed)
We will be checking the following labs today BMET  See Dr. Graciela Husbands back as planned  Let us know if you have any dizzy spells.  Stay on your current medicines.  Call the [facility] office at [phone] if you have any questions, problems or concerns.

## 2014-02-20 NOTE — Progress Notes (Signed)
CARDIOLOGY OFFICE NOTE  Date:  02/20/2014    Larry Rangel Date of Birth: 02-04-1950 Medical Record #[id number]  PCP:  Danise Edge, MD  Cardiologist:  Graciela Husbands    Chief Complaint  Patient presents with  . Loss of Consciousness    Post Hospital visit - seen for Dr. Graciela Husbands.      History of Present Illness: Larry Rangel is a 64 y.o. male who presents today for a post hospital visit. Seen for Dr Graciela Husbands. He has a past medical history that includes CAD with prior CABG, ICM with ICD implant back in October of 2015, prior atrial flutter ablation in 2014, PAF, HLD and HTN.   Admitted on 02/13/2014 with a sudden unwitnessed syncopal spell without prodrome - he had been walking down the hall. Upon awakening, he had no residual symptoms. Two days before this episode, he had 24 hours of fevers and chills but no other symptoms. He presented to the ER for further evaluation after his syncopal spell. Lab work is notable for creatinine of 1.83 (improved to 1.5 after hydration), trop of 0.12, TSH of 0.276. He was monitored on telemetry overnight with no arrhythmias. Device was interrogated and found to be functioning normally with no arrhythmias identified. His ICD is set for dual zones at 210 and 240 bpm. There is no monitor zone available on this device and no histograms. Device was reprogrammed to initial zone of 180bpm and 2nd zone at 240bpm prior to discharge.   Dr Ladona Ridgel examined the patient on 02-14-14 and considered him stable for discharge to home. Due to renal insufficiency, his Avapro and Spironolactone will be decreased to 1/2 home dose. He was to continue lasix at discharge. He has been advised that he should not drive for 6 months following syncopal spell.  He had no chest pain or other anginal symptoms and his troponin was felt to be not clinically significant (in context of syncope and possible arrhythmia and flat trend).   Dr. Lubertha Basque recommendations were noted as "Rec:  Multiple possibilities exist for the etiology of the patient's syncope. He may well have had VT below the device detection window which stopped spontaneously. He could have had complete heart block although his QRS duration is 110 (ILBBB). He could have had orthostasis but his history of a sudden episode makes this seem less likely. I will discuss turning down the detection interval with his primary EP and will plan to send him home today. He and his spouse have been told that he is not supposed to drive for 6 months."  Comes back here today. Here alone. He is doing well. No complaints. He says that he has figured out what has happened. He feels like he had a virus just prior to this. He has been dizzy in the past. He had been lying on the couch and was getting up towards his door to see if his wife had left. He has had no ICD discharges. No chest pain. Not short of breath. Not dizzy or lightheaded at all. He admits that he is driving - says he has no choice. He has daily alcohol use.   Past Medical History  Diagnosis Date  . Anxiety   . Hypertension   . Low back pain   . Tobacco user   . CAD (coronary artery disease)     a. s/p CABG 2009. b. Cath 07/2013: 3/5 patent grafts (appear to have chronic occ grafts), mod diag/L PL branch, did not appear  flow limiting, elevated filling pressures.  . Hyperlipidemia   . S/P colonoscopy   . Myocardial infarction   . Arthritis   . Paroxysmal atrial flutter 07-2012    a. s/p ablation by Dr Graciela Husbands 08-06-2012. b. Recurrence 07/2013.   Marland Kitchen Chicken pox as a child  . Measles as a child  . Mumps as a child  . Abnormal liver function 08/23/2010  . Chronic combined systolic and diastolic CHF (congestive heart failure)     a. Echo 6/14: Mild LVH, EF 30-35%, diffuse HK, MAC, mild BAE. b. Drop in EF to 20% by echo 07/2013.  . Diabetes mellitus   . PAF (paroxysmal atrial fibrillation)   . LBBB (left bundle branch block)     Past Surgical History  Procedure Laterality  Date  . Shoulder surgery    . Coronary artery bypass graft  2009    x 5  . Tee without cardioversion N/A 07/15/2012    Procedure: TRANSESOPHAGEAL ECHOCARDIOGRAM (TEE);  Surgeon: Vesta Mixer, MD;  Location: [facility] ENDOSCOPY;  Service: Cardiovascular;  Laterality: N/A;  . Cardioversion N/A 07/15/2012    Procedure: CARDIOVERSION;  Surgeon: Vesta Mixer, MD;  Location: [facility] ENDOSCOPY;  Service: Cardiovascular;  Laterality: N/A;  . Ablation of dysrhythmic focus  08/06/2012    CTI ablation by Dr Graciela Husbands  . Tonsillectomy    . Cardiac catheterization    . Atrial flutter ablation N/A 08/06/2012    Procedure: ATRIAL FLUTTER ABLATION;  Surgeon: Duke Salvia, MD;  Location: [facility] CATH LAB;  Service: Cardiovascular;  Laterality: N/A;  . Left heart catheterization with coronary angiogram N/A 08/01/2013    Procedure: LEFT HEART CATHETERIZATION WITH CORONARY ANGIOGRAM;  Surgeon: Kathleene Hazel, MD;  Location: [facility] CATH LAB;  Service: Cardiovascular;  Laterality: N/A;  . Implantable cardioverter defibrillator implant N/A 11/10/2013    Procedure: SUB Q IMPLANTABLE CARDIOVERTER DEFIBRILLATOR IMPLANT;  Surgeon: Duke Salvia, MD;  Location: [facility] CATH LAB;  Service: Cardiovascular;  Laterality: N/A;  . Pocket revision N/A 12/16/2013    Procedure: POCKET REVISION;  Surgeon: Marinus Maw, MD;  Location[facility] CATH LAB;  Service: Cardiovascular;  Laterality: N/A;     Medications: Current Outpatient Prescriptions  Medication Sig Dispense Refill  . atorvastatin (LIPITOR) 20 MG tablet Take 1 tablet (20 mg total) by mouth daily. 90 tablet 1  . busPIRone (BUSPAR) 10 MG tablet Take 10 mg by mouth 2 (two) times daily.    . carvedilol (COREG) 12.5 MG tablet Take 1 tablet (12.5 mg total) by mouth 2 (two) times daily with a meal. 60 tablet 12  . clonazePAM (KLONOPIN) 0.5 MG tablet TAKE 1 TABLET BY MOUTH 3 TIMES A DAY 90 tablet 0  . furosemide (LASIX) 40 MG tablet TAKE 1 TABLET BY MOUTH DAILY. IF WEIGHT INCREASED > 3LBS  OVERNIGHT INCREASE BY ONE TABLET 45 tablet 3  . glipiZIDE (GLUCOTROL) 2.5 mg TABS tablet Take 0.5 tablets (2.5 mg total) by mouth daily before breakfast. 30 tablet 12  . irbesartan (AVAPRO) 150 MG tablet Take 1 tablet (150 mg total) by mouth daily. 30 tablet 11  . metFORMIN (GLUCOPHAGE) 500 MG tablet Take 1 tablet (500 mg total) by mouth 2 (two) times daily with a meal. 60 tablet 6  . OVER THE COUNTER MEDICATION Place 1 drop into both eyes as needed (for dry eyes). "OTC Good Sense Eye Drops"    . rivaroxaban (XARELTO) 20 MG TABS tablet Take 1 tablet (20 mg total) by mouth daily with supper. 30  tablet 12  . spironolactone (ALDACTONE) 25 MG tablet Take 0.5 tablets (12.5 mg total) by mouth daily. 30 tablet 6   No current facility-administered medications for this visit.    Allergies: No Known Allergies  Social History: The patient  reports that he quit smoking about 3 years ago. His smoking use included Cigarettes. He has a 40 pack-year smoking history. He has never used smokeless tobacco. He reports that he drinks about 14.4 oz of alcohol per week. He reports that he does not use illicit drugs.   Family History: The patient's family history includes Alzheimer's disease in his mother; Cancer in his father; Drug abuse in his daughter; Heart disease in his father; Heart failure (age of onset: 33) in his father; Hyperlipidemia in his father; Hypertension in his father; Leukemia in his brother. There is no history of Early death, Kidney disease, or Stroke.   Review of Systems: Please see the history of present illness.   All other systems are reviewed and negative.   Physical Exam: VS:  BP 90/60 mmHg  Pulse 80  Ht  (1.88 m)  Wt 211 lb 3.2 oz (95.8 kg)  BMI 27.11 kg/m2  SpO2 100% .  BMI Body mass index is 27.11 kg/(m^2).  Wt Readings from Last 3 Encounters:  02/20/14 211 lb 3.2 oz (95.8 kg)  02/14/14 221 lb 9.6 oz (100.517 kg)  12/16/13 210 lb (95.255 kg)    General: Pleasant. Well  developed, well nourished and in no acute distress. Color a little sallow.  HEENT: Normal. Neck: Supple, no JVD, carotid bruits, or masses noted.  Cardiac: Regular rate and rhythm. No murmur but he does have an S3. No edema.  Respiratory:  Lungs are clear to auscultation bilaterally with normal work of breathing.  GI: Soft and nontender.  MS: No deformity or atrophy. Gait and ROM intact. Skin: Warm and dry. Color is normal.  Neuro:  Strength and sensation are intact and no gross focal deficits noted.  Psych: Alert, appropriate and with normal affect.   LABORATORY DATA:   EKG:  EKG is not ordered today.   Lab Results  Component Value Date   WBC 8.0 02/14/2014   HGB 11.9* 02/14/2014   HCT 34.5* 02/14/2014   PLT 155 02/14/2014   GLUCOSE 121* 02/14/2014   CHOL 123 02/14/2014   TRIG 99 02/14/2014   HDL 44 02/14/2014   LDLDIRECT 52.0 03/17/2011   LDLCALC 59 02/14/2014   ALT 45 11/29/2013   AST 37 11/29/2013   NA 132* 02/14/2014   K 5.2* 02/14/2014   CL 104 02/14/2014   CREATININE 1.53* 02/14/2014   BUN 32* 02/14/2014   CO2 19 02/14/2014   TSH 0.276* 02/13/2014   PSA 1.86 04/16/2012   INR 2.88* 02/14/2014   HGBA1C 5.6 02/13/2014   MICROALBUR 8.12* 06/07/2013    BNP (last 3 results)  Recent Labs  02/13/14 1131  BNP 20.5    ProBNP (last 3 results) No results for input(s): PROBNP in the last 8760 hours.   Other Studies Reviewed Today:   Echo Study Conclusions from 09/2013  - Left ventricle: The cavity size was severely dilated. Wall thickness was increased in a pattern of mild LVH. The estimated ejection fraction was 15%. Diffuse hypokinesis. Doppler parameters are consistent with abnormal left ventricular relaxation (grade 1 diastolic dysfunction). - Mitral valve: Calcified annulus. There was mild regurgitation. - Left atrium: The atrium was mildly dilated. - Right ventricle: Systolic function was moderately reduced.  Impressions:  -  Severe global  reduction in LV function; grade 1 diastolic dysfunction; mild LAE; mild MR; moderately reduced RV function. Compared to 08/03/13, LV function remains severely reduced; diastolic dysfunction now grade 1.   Assessment/Plan: 1. Syncope, etiology unknown - seems to have been orthostatic and exacerbated by possible viral syndrome. Reminded about the "no driving" for 6 months - he has been advised. Patient seen by Dr. Graciela Husbands as well. Will see back as planned. His device has already been reprogrammed with lower detection rates. Will see back as planned in April.   2. Ischemic CM, s/p MI, s/p CABG -  No active chest pain. His aldactone and avapro have been cut back - will recheck BMET today. Continue with his current regimen.   3. ILBBB  4. PAF  5. H/o atrial flutter s/p ablation  6. CKD - has had his ARB and aldactone doses cut back - recheck BMET today.  Current medicines are reviewed with the patient today.  The patient does not have  concerns regarding medicines other than what has been noted above.  The following changes have been made:  N/A Labs/ tests ordered today include:   No orders of the defined types were placed in this encounter.   Disposition:   FU with Graciela Husbands in April as planned.   Patient is agreeable to this plan and will call if any problems develop in the interim.   Signed: Rosalio Macadamia, RN, ANP-C 02/20/2014 10:48 AM  [facility] [address] [place], [place]  [address] Phone: [phone] Fax: [phone]

## 2014-02-23 ENCOUNTER — Encounter: Payer: Self-pay | Admitting: Family Medicine

## 2014-02-23 ENCOUNTER — Ambulatory Visit (INDEPENDENT_AMBULATORY_CARE_PROVIDER_SITE_OTHER): Payer: Medicare HMO | Admitting: Family Medicine

## 2014-02-23 VITALS — BP 110/62 | HR 77 | Temp 98.3°F | Ht 74.0 in | Wt 212.1 lb

## 2014-02-23 DIAGNOSIS — I4892 Unspecified atrial flutter: Secondary | ICD-10-CM | POA: Diagnosis not present

## 2014-02-23 DIAGNOSIS — R55 Syncope and collapse: Secondary | ICD-10-CM | POA: Diagnosis not present

## 2014-02-23 DIAGNOSIS — D649 Anemia, unspecified: Secondary | ICD-10-CM

## 2014-02-23 DIAGNOSIS — I10 Essential (primary) hypertension: Secondary | ICD-10-CM

## 2014-02-23 DIAGNOSIS — E785 Hyperlipidemia, unspecified: Secondary | ICD-10-CM | POA: Diagnosis not present

## 2014-02-23 DIAGNOSIS — Z79899 Other long term (current) drug therapy: Secondary | ICD-10-CM | POA: Diagnosis not present

## 2014-02-23 MED ORDER — CLONAZEPAM 0.5 MG PO TABS: 0.5000 mg | ORAL_TABLET | Freq: Three times a day (TID) | ORAL | Status: DC

## 2014-02-23 NOTE — Patient Instructions (Signed)
Needs 4 week lab only appt  Needs Appt with MD in 4 months  Dehydration, Adult Dehydration is when you lose more fluids from the body than you take in. Vital organs like the kidneys, brain, and heart cannot function without a proper amount of fluids and salt. Any loss of fluids from the body can cause dehydration.  CAUSES   Vomiting.  Diarrhea.  Excessive sweating.  Excessive urine output.  Fever. SYMPTOMS  Mild dehydration  Thirst.  Dry lips.  Slightly dry mouth. Moderate dehydration  Very dry mouth.  Sunken eyes.  Skin does not bounce back quickly when lightly pinched and released.  Dark urine and decreased urine production.  Decreased tear production.  Headache. Severe dehydration  Very dry mouth.  Extreme thirst.  Rapid, weak pulse (more than 100 beats per minute at rest).  Cold hands and feet.  Not able to sweat in spite of heat and temperature.  Rapid breathing.  Blue lips.  Confusion and lethargy.  Difficulty being awakened.  Minimal urine production.  No tears. DIAGNOSIS  Your caregiver will diagnose dehydration based on your symptoms and your exam. Blood and urine tests will help confirm the diagnosis. The diagnostic evaluation should also identify the cause of dehydration. TREATMENT  Treatment of mild or moderate dehydration can often be done at home by increasing the amount of fluids that you drink. It is best to drink small amounts of fluid more often. Drinking too much at one time can make vomiting worse. Refer to the home care instructions below. Severe dehydration needs to be treated at the hospital where you will probably be given intravenous (IV) fluids that contain water and electrolytes. HOME CARE INSTRUCTIONS   Ask your caregiver about specific rehydration instructions.  Drink enough fluids to keep your urine clear or pale yellow.  Drink small amounts frequently if you have nausea and vomiting.  Eat as you normally  do.  Avoid:  Foods or drinks high in sugar.  Carbonated drinks.  Juice.  Extremely hot or cold fluids.  Drinks with caffeine.  Fatty, greasy foods.  Alcohol.  Tobacco.  Overeating.  Gelatin desserts.  Wash your hands well to avoid spreading bacteria and viruses.  Only take over-the-counter or prescription medicines for pain, discomfort, or fever as directed by your caregiver.  Ask your caregiver if you should continue all prescribed and over-the-counter medicines.  Keep all follow-up appointments with your caregiver. SEEK MEDICAL CARE IF:  You have abdominal pain and it increases or stays in one area (localizes).  You have a rash, stiff neck, or severe headache.  You are irritable, sleepy, or difficult to awaken.  You are weak, dizzy, or extremely thirsty. SEEK IMMEDIATE MEDICAL CARE IF:   You are unable to keep fluids down or you get worse despite treatment.  You have frequent episodes of vomiting or diarrhea.  You have blood or green matter (bile) in your vomit.  You have blood in your stool or your stool looks black and tarry.  You have not urinated in 6 to 8 hours, or you have only urinated a small amount of very dark urine.  You have a fever.  You faint. MAKE SURE YOU:   Understand these instructions.  Will watch your condition.  Will get help right away if you are not doing well or get worse. Document Released: 12/30/2004 Document Revised: 03/24/2011 Document Reviewed: 08/19/2010 [facility] Patient Information 2015 [place], [place]. This information is not intended to replace advice given to you by your  health care provider. Make sure you discuss any questions you have with your health care provider. months

## 2014-02-23 NOTE — Progress Notes (Signed)
Pre visit review using our clinic review tool, if applicable. No additional management support is needed unless otherwise documented below in the visit note. 

## 2014-03-01 ENCOUNTER — Telehealth: Payer: Self-pay | Admitting: Internal Medicine

## 2014-03-01 NOTE — Telephone Encounter (Signed)
Pt c/o Syncope: STAT if syncope occurred within 30 minutes and pt complains of lightheadedness High Priority if episode of passing out, completely, today or in last 24 hours   1. Did you pass out today? Yes   2. When is the last time you passed out? About an hour ago  3. Has this occurred multiple times? Yes  4. Did you have any symptoms prior to passing out? Dizziness   Pt's wife calling stating that pt "fell out/passed out".

## 2014-03-01 NOTE — Telephone Encounter (Signed)
Spoke with pt wife she is aware that pt needs to go to the emergency room to be evaluated.Pt fell this morning after getting dressed and was SOB after putting on his pants. She stated this is not the first time as the pt has been dizzy over the past few days and has fallen previously.  Asked to speak to pt and she stated pt had left to go to work.  She was going to find pt either by going to his job or calling his office to get him to go to the ER.

## 2014-03-03 ENCOUNTER — Other Ambulatory Visit: Payer: Self-pay | Admitting: Physician Assistant

## 2014-03-07 ENCOUNTER — Encounter: Payer: Self-pay | Admitting: Family Medicine

## 2014-03-07 NOTE — Assessment & Plan Note (Signed)
Tolerating Xarelto, pacer in place, in RRR today

## 2014-03-07 NOTE — Assessment & Plan Note (Signed)
Well controlled, no changes to meds. Encouraged heart healthy diet such as the DASH diet and exercise as tolerated.  °

## 2014-03-07 NOTE — Assessment & Plan Note (Signed)
Tolerating statin, encouraged heart healthy diet, avoid trans fats, minimize simple carbs and saturated fats. Increase exercise as tolerated 

## 2014-03-07 NOTE — Assessment & Plan Note (Signed)
Here today for hospital follow up secondary to an episode of Syncope. In retrospect he believes he had a viral stomach bug with fevers, which lead to decreased po intake and dehydration he is feeling better now. He is encouraged to eat small, frequent meals with lean proteins and intake 64 oz of clear fluids

## 2014-03-07 NOTE — Progress Notes (Signed)
Larry Rangel  [id number] 1950/08/27 03/07/2014      Progress Note-Follow Up  Subjective  Chief Complaint  Chief Complaint  Patient presents with  . Hospitalization Follow-up    HPI  Patient is a 64 y.o. male in today for routine medical care. Patient is in today for hospital follow-up. He presented to the emergency room after a fall during a syncopal episode. He believes he became dehydrated and lightheaded and passed out as a result. He had been struggling with nausea and vomiting and viral gastroenteritis but those symptoms of all resolved. Today he says he feels well. And offers no acute concerns. Denies CP/palp/SOB/HA/congestion/fevers/GI or GU c/o. Taking meds as prescribed Past Medical History  Diagnosis Date  . Anxiety   . Hypertension   . Low back pain   . Tobacco user   . CAD (coronary artery disease)     a. s/p CABG 2009. b. Cath 07/2013: 3/5 patent grafts (appear to have chronic occ grafts), mod diag/L PL branch, did not appear flow limiting, elevated filling pressures.  . Hyperlipidemia   . S/P colonoscopy   . Myocardial infarction   . Arthritis   . Paroxysmal atrial flutter 07-2012    a. s/p ablation by Dr Graciela Husbands 08-06-2012. b. Recurrence 07/2013.   Marland Kitchen Chicken pox as a child  . Measles as a child  . Mumps as a child  . Abnormal liver function 08/23/2010  . Chronic combined systolic and diastolic CHF (congestive heart failure)     a. Echo 6/14: Mild LVH, EF 30-35%, diffuse HK, MAC, mild BAE. b. Drop in EF to 20% by echo 07/2013.  . Diabetes mellitus   . PAF (paroxysmal atrial fibrillation)   . LBBB (left bundle branch block)     Past Surgical History  Procedure Laterality Date  . Shoulder surgery    . Coronary artery bypass graft  2009    x 5  . Tee without cardioversion N/A 07/15/2012    Procedure: TRANSESOPHAGEAL ECHOCARDIOGRAM (TEE);  Surgeon: Vesta Mixer, MD;  Location: [facility] ENDOSCOPY;  Service: Cardiovascular;  Laterality: N/A;  . Cardioversion N/A  07/15/2012    Procedure: CARDIOVERSION;  Surgeon: Vesta Mixer, MD;  Location: [facility] ENDOSCOPY;  Service: Cardiovascular;  Laterality: N/A;  . Ablation of dysrhythmic focus  08/06/2012    CTI ablation by Dr Graciela Husbands  . Tonsillectomy    . Cardiac catheterization    . Atrial flutter ablation N/A 08/06/2012    Procedure: ATRIAL FLUTTER ABLATION;  Surgeon: Duke Salvia, MD;  Location: [facility] CATH LAB;  Service: Cardiovascular;  Laterality: N/A;  . Left heart catheterization with coronary angiogram N/A 08/01/2013    Procedure: LEFT HEART CATHETERIZATION WITH CORONARY ANGIOGRAM;  Surgeon: Kathleene Hazel, MD;  Location: [facility] CATH LAB;  Service: Cardiovascular;  Laterality: N/A;  . Implantable cardioverter defibrillator implant N/A 11/10/2013    Procedure: SUB Q IMPLANTABLE CARDIOVERTER DEFIBRILLATOR IMPLANT;  Surgeon: Duke Salvia, MD;  Location: [facility] CATH LAB;  Service: Cardiovascular;  Laterality: N/A;  . Pocket revision N/A 12/16/2013    Procedure: POCKET REVISION;  Surgeon: Marinus Maw, MD;  Location: [facility] CATH LAB;  Service: Cardiovascular;  Laterality: N/A;    Family History  Problem Relation Age of Onset  . Alzheimer's disease Mother   . Heart failure Father 76  . Hypertension Father   . Hyperlipidemia Father   . Cancer Father     lung- took half of a young- smoker  . Heart disease Father  MI at 32  . Early death Neg Hx   . Kidney disease Neg Hx   . Stroke Neg Hx   . Leukemia Brother   . Drug abuse Daughter     heroine    History   Social History  . Marital Status: Married    Spouse Name: N/A  . Number of Children: N/A  . Years of Education: N/A   Occupational History  . Not on file.   Social History Main Topics  . Smoking status: Former Smoker -- 1.00 packs/day for 40 years    Types: Cigarettes    Quit date: 02/01/2011  . Smokeless tobacco: Never Used  . Alcohol Use: 14.4 oz/week    24 Glasses of wine per week     Comment: drinks 2 bottles of wine per week  .  Drug Use: No  . Sexual Activity: Yes   Other Topics Concern  . 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   He has been married four times previously, the first  For 25yrs and has two children by the marriage which are grown, the second time for 11 yrs and has 2 by that marriage.   Wife has custody in [place].   The third marriage was 4 1/2 yrs and most recent marriage was the past eight weeks and he is currently estranged from that person.    Current Outpatient Prescriptions on File Prior to Visit  Medication Sig Dispense Refill  . atorvastatin (LIPITOR) 20 MG tablet Take 1 tablet (20 mg total) by mouth daily. 90 tablet 1  . busPIRone (BUSPAR) 10 MG tablet Take 10 mg by mouth 2 (two) times daily.    . carvedilol (COREG) 12.5 MG tablet Take 1 tablet (12.5 mg total) by mouth 2 (two) times daily with a meal. 60 tablet 12  . furosemide (LASIX) 40 MG tablet TAKE 1 TABLET BY MOUTH DAILY. IF WEIGHT INCREASED > 3LBS OVERNIGHT INCREASE BY ONE TABLET 45 tablet 3  . glipiZIDE (GLUCOTROL) 2.5 mg TABS tablet Take 0.5 tablets (2.5 mg total) by mouth daily before breakfast. 30 tablet 12  . irbesartan (AVAPRO) 150 MG tablet Take 1 tablet (150 mg total) by mouth daily. 30 tablet 11  . metFORMIN (GLUCOPHAGE) 500 MG tablet Take 1 tablet (500 mg total) by mouth 2 (two) times daily with a meal. 60 tablet 6  . OVER THE COUNTER MEDICATION Place 1 drop into both eyes as needed (for dry eyes). "OTC Good Sense Eye Drops"    . rivaroxaban (XARELTO) 20 MG TABS tablet Take 1 tablet (20 mg total) by mouth daily with supper. 30 tablet 12  . spironolactone (ALDACTONE) 25 MG tablet Take 0.5 tablets (12.5 mg total) by mouth daily. 30 tablet 6   No current facility-administered medications on file prior to visit.    No Known Allergies  Review of Systems  Review of Systems    Constitutional: Negative for fever and malaise/fatigue.  HENT: Negative for congestion.   Eyes: Negative for discharge.  Respiratory: Negative for shortness of breath.   Cardiovascular: Negative for chest pain, palpitations and leg swelling.  Gastrointestinal: Negative for nausea, abdominal pain and diarrhea.  Genitourinary: Negative for dysuria.  Musculoskeletal: Negative for falls.  Skin: Negative for rash.  Neurological: Positive for loss of consciousness. Negative for headaches.  Endo/Heme/Allergies: Negative for polydipsia.  Psychiatric/Behavioral:  Negative for depression and suicidal ideas. The patient is not nervous/anxious and does not have insomnia.     Objective  BP 110/62 mmHg  Pulse 77  Temp(Src) 98.3 F (36.8 C) (Oral)  Ht  (1.88 m)  Wt 212 lb 2 oz (96.219 kg)  BMI 27.22 kg/m2  SpO2 97%  Physical Exam  Physical Exam  Constitutional: He is oriented to person, place, and time and well-developed, well-nourished, and in no distress. No distress.  HENT:  Head: Normocephalic and atraumatic.  Dry MM  Eyes: Conjunctivae are normal.  Neck: Neck supple. No thyromegaly present.  Cardiovascular: Normal rate, regular rhythm and normal heart sounds.   No murmur heard. Pulmonary/Chest: Effort normal and breath sounds normal. No respiratory distress.  Abdominal: He exhibits no distension and no mass. There is no tenderness.  Musculoskeletal: He exhibits no edema.  Neurological: He is alert and oriented to person, place, and time.  Skin: Skin is warm.  Psychiatric: Memory, affect and judgment normal.    Lab Results  Component Value Date   TSH 0.276* 02/13/2014   Lab Results  Component Value Date   WBC 8.0 02/14/2014   HGB 11.9* 02/14/2014   HCT 34.5* 02/14/2014   MCV 91.0 02/14/2014   PLT 155 02/14/2014   Lab Results  Component Value Date   CREATININE 1.47 02/20/2014   BUN 24* 02/20/2014   NA 138 02/20/2014   K 4.9 02/20/2014   CL 104 02/20/2014   CO2  24 02/20/2014   Lab Results  Component Value Date   ALT 45 11/29/2013   AST 37 11/29/2013   ALKPHOS 67 11/29/2013   BILITOT 0.5 11/29/2013   Lab Results  Component Value Date   CHOL 123 02/14/2014   Lab Results  Component Value Date   HDL 44 02/14/2014   Lab Results  Component Value Date   LDLCALC 59 02/14/2014   Lab Results  Component Value Date   TRIG 99 02/14/2014   Lab Results  Component Value Date   CHOLHDL 2.8 02/14/2014     Assessment & Plan  Essential hypertension Well controlled, no changes to meds. Encouraged heart healthy diet such as the DASH diet and exercise as tolerated.    Hyperlipidemia Tolerating statin, encouraged heart healthy diet, avoid trans fats, minimize simple carbs and saturated fats. Increase exercise as tolerated   Atrial flutter Tolerating Xarelto, pacer in place, in RRR today   Syncope Here today for hospital follow up secondary to an episode of Syncope. In retrospect he believes he had a viral stomach bug with fevers, which lead to decreased po intake and dehydration he is feeling better now. He is encouraged to eat small, frequent meals with lean proteins and intake 64 oz of clear fluids

## 2014-03-16 ENCOUNTER — Other Ambulatory Visit: Payer: Self-pay | Admitting: Internal Medicine

## 2014-03-17 NOTE — Telephone Encounter (Signed)
Patient needs to get refill from ordering physician or PCP

## 2014-03-20 ENCOUNTER — Telehealth: Payer: Self-pay | Admitting: Family Medicine

## 2014-03-20 ENCOUNTER — Encounter: Payer: Self-pay | Admitting: Family Medicine

## 2014-03-20 ENCOUNTER — Other Ambulatory Visit: Payer: Self-pay | Admitting: Family Medicine

## 2014-03-20 ENCOUNTER — Other Ambulatory Visit: Payer: Self-pay | Admitting: Internal Medicine

## 2014-03-20 MED ORDER — METFORMIN HCL 500 MG PO TABS: 500.0000 mg | ORAL_TABLET | Freq: Two times a day (BID) | ORAL | Status: DC

## 2014-03-20 NOTE — Telephone Encounter (Signed)
Patient informed refill done 

## 2014-03-20 NOTE — Telephone Encounter (Signed)
Caller name:Larry Rangel Relationship to patient:self Can be [phone] Pharmacy: [facility]  Reason for call: PT sent my chart message in regards to metFORMIN (GLUCOPHAGE) 500 MG tablet- out of meds completely. Requesting that RX be called in.

## 2014-03-24 ENCOUNTER — Other Ambulatory Visit (INDEPENDENT_AMBULATORY_CARE_PROVIDER_SITE_OTHER): Payer: Commercial Managed Care - HMO

## 2014-03-24 DIAGNOSIS — D649 Anemia, unspecified: Secondary | ICD-10-CM

## 2014-03-24 LAB — CBC
HEMATOCRIT: 38.4 % — AB (ref 39.0–52.0)
Hemoglobin: 13.1 g/dL (ref 13.0–17.0)
MCHC: 34.2 g/dL (ref 30.0–36.0)
MCV: 92.7 fl (ref 78.0–100.0)
Platelets: 170 10*3/uL (ref 150.0–400.0)
RBC: 4.14 Mil/uL — ABNORMAL LOW (ref 4.22–5.81)
RDW: 13.6 % (ref 11.5–15.5)
WBC: 7.1 10*3/uL (ref 4.0–10.5)

## 2014-03-28 ENCOUNTER — Encounter: Payer: Self-pay | Admitting: Family Medicine

## 2014-03-29 ENCOUNTER — Other Ambulatory Visit: Payer: Self-pay | Admitting: Family Medicine

## 2014-03-29 DIAGNOSIS — E669 Obesity, unspecified: Principal | ICD-10-CM

## 2014-03-29 DIAGNOSIS — E1169 Type 2 diabetes mellitus with other specified complication: Secondary | ICD-10-CM

## 2014-04-01 ENCOUNTER — Encounter: Payer: Self-pay | Admitting: Family Medicine

## 2014-04-06 DIAGNOSIS — E119 Type 2 diabetes mellitus without complications: Secondary | ICD-10-CM | POA: Diagnosis not present

## 2014-04-06 DIAGNOSIS — Q667 Congenital pes cavus: Secondary | ICD-10-CM | POA: Diagnosis not present

## 2014-04-12 ENCOUNTER — Other Ambulatory Visit: Payer: Self-pay | Admitting: Physician Assistant

## 2014-04-17 ENCOUNTER — Encounter: Payer: Self-pay | Admitting: Internal Medicine

## 2014-04-17 ENCOUNTER — Ambulatory Visit (INDEPENDENT_AMBULATORY_CARE_PROVIDER_SITE_OTHER): Payer: Commercial Managed Care - HMO | Admitting: Internal Medicine

## 2014-04-17 VITALS — BP 104/60 | HR 70 | Ht 74.0 in | Wt 208.4 lb

## 2014-04-17 DIAGNOSIS — I429 Cardiomyopathy, unspecified: Secondary | ICD-10-CM | POA: Diagnosis not present

## 2014-04-17 DIAGNOSIS — R55 Syncope and collapse: Secondary | ICD-10-CM | POA: Diagnosis not present

## 2014-04-17 LAB — MDC_IDC_ENUM_SESS_TYPE_INCLINIC: Implantable Pulse Generator Serial Number: 108164

## 2014-04-17 NOTE — Progress Notes (Signed)
CARDIOLOGY OFFICE NOTE  Date:  04/17/2014    Larry Rangel Date of Birth: 04/13/1950 Medical Record #[id number]  PCP:  Danise Edge, MD  Cardiologist:  Graciela Husbands    Chief Complaint  Patient presents with  . Appointment    ROV/Device check     History of Present Illness: Larry Rangel is a 64 y.o. male who presents today for a post hospital visit. Seen for Dr Graciela Husbands. He has a past medical history that includes CAD with prior CABG, ICM with S-ICD implant back in October of 2015, prior atrial flutter ablation in 2014, PAF, HLD and HTN.   Admitted on 02/13/2014 with a sudden unwitnessed syncopal spell without prodrome -it was felt to be possibly orthostatic as his creatinine was elevated; is also thought possibly could be related to a VT below detection rate is device was reprogrammed.  Aldactone and Avapro were down titrated.  Metabolic profile 4/09 was associated with a potassium of 5.2 and a creatinine of 1.5 3 repeat a week later the potassium was 4.9  No recurrent syncope  He has no chest pain or peripheral edema. He has no nocturnal dyspnea he has chronic stable dyspnea on exertion. He is excited that spring is here. He likes to play outside with tennis and racquetball and snorkeling.  sTudies reviewed ECG  Echocardiogram 9/15 demonstrated an estimated EF of 15% with mild MR and moderate RV dysfunction  Past Medical History  Diagnosis Date  . Anxiety   . Hypertension   . Low back pain   . Tobacco user   . CAD (coronary artery disease)     a. s/p CABG 2009. b. Cath 07/2013: 3/5 patent grafts (appear to have chronic occ grafts), mod diag/L PL branch, did not appear flow limiting, elevated filling pressures.  . Hyperlipidemia   . S/P colonoscopy   . Myocardial infarction   . Arthritis   . Paroxysmal atrial flutter 07-2012    a. s/p ablation by Dr Graciela Husbands 08-06-2012. b. Recurrence 07/2013.   Marland Kitchen Chicken pox as a child  . Measles as a child  . Mumps as a child  . Abnormal  liver function 08/23/2010  . Chronic combined systolic and diastolic CHF (congestive heart failure)     a. Echo 6/14: Mild LVH, EF 30-35%, diffuse HK, MAC, mild BAE. b. Drop in EF to 20% by echo 07/2013.  . Diabetes mellitus   . PAF (paroxysmal atrial fibrillation)   . LBBB (left bundle branch block)     Past Surgical History  Procedure Laterality Date  . Shoulder surgery    . Coronary artery bypass graft  2009    x 5  . Tee without cardioversion N/A 07/15/2012    Procedure: TRANSESOPHAGEAL ECHOCARDIOGRAM (TEE);  Surgeon: Vesta Mixer, MD;  Location: [facility] ENDOSCOPY;  Service: Cardiovascular;  Laterality: N/A;  . Cardioversion N/A 07/15/2012    Procedure: CARDIOVERSION;  Surgeon: Vesta Mixer, MD;  Location: [facility] ENDOSCOPY;  Service: Cardiovascular;  Laterality: N/A;  . Ablation of dysrhythmic focus  08/06/2012    CTI ablation by Dr Graciela Husbands  . Tonsillectomy    . Cardiac catheterization    . Atrial flutter ablation N/A 08/06/2012    Procedure: ATRIAL FLUTTER ABLATION;  Surgeon: Duke Salvia, MD;  Location: [facility] C[facility] LAB;  Service: Cardiovascular;  Laterality: N/A;  . Left heart catheterization with coronary angiogram N/A 08/01/2013    Procedure: LEFT HEART CATHETERIZATION WITH CORONARY ANGIOGRAM;  Surgeon: Kathleene Hazel, MD;  Location: [facility] CATH LAB;  Service: Cardiovascular;  Laterality: N/A;  . Implantable cardioverter defibrillator implant N/A 11/10/2013    Procedure: SUB Q IMPLANTABLE CARDIOVERTER DEFIBRILLATOR IMPLANT;  Surgeon: Duke Salvia, MD;  Location: [facility] CATH LAB;  Service: Cardiovascular;  Laterality: N/A;  . Pocket revision N/A 12/16/2013    Procedure: POCKET REVISION;  Surgeon: Marinus Maw, MD;  Location: [facility] CATH LAB;  Service: Cardiovascular;  Laterality: N/A;     Medications: Current Outpatient Prescriptions  Medication Sig Dispense Refill  . atorvastatin (LIPITOR) 20 MG tablet Take 1 tablet (20 mg total) by mouth daily. 90 tablet 1  . busPIRone (BUSPAR) 10 MG  tablet Take 10 mg by mouth 2 (two) times daily.    . carvedilol (COREG) 12.5 MG tablet Take 1 tablet (12.5 mg total) by mouth 2 (two) times daily with a meal. 60 tablet 12  . clonazePAM (KLONOPIN) 0.5 MG tablet Take 1 tablet (0.5 mg total) by mouth 3 (three) times daily. 90 tablet 2  . furosemide (LASIX) 40 MG tablet TAKE 1 TABLET BY MOUTH DAILY. IF WEIGHT INCREASED > 3LBS OVERNIGHT INCREASE BY ONE TABLET 45 tablet 3  . glipiZIDE (GLUCOTROL) 2.5 mg TABS tablet Take 0.5 tablets (2.5 mg total) by mouth daily before breakfast. 30 tablet 12  . irbesartan (AVAPRO) 150 MG tablet Take 1 tablet (150 mg total) by mouth daily. 30 tablet 11  . metFORMIN (GLUCOPHAGE) 500 MG tablet Take 1 tablet (500 mg total) by mouth 2 (two) times daily with a meal. 60 tablet 6  . OVER THE COUNTER MEDICATION Place 1 drop into both eyes as needed (for dry eyes). "OTC Good Sense Eye Drops"    . rivaroxaban (XARELTO) 20 MG TABS tablet Take 1 tablet (20 mg total) by mouth daily with supper. 30 tablet 12  . spironolactone (ALDACTONE) 25 MG tablet Take 0.5 tablets (12.5 mg total) by mouth daily. 30 tablet 6   No current facility-administered medications for this visit.    Allergies: No Known Allergies  Social History: The patient  reports that he quit smoking about 3 years ago. His smoking use included Cigarettes. He has a 40 pack-year smoking history. He has never used smokeless tobacco. He reports that he drinks about 14.4 oz of alcohol per week. He reports that he does not use illicit drugs.   Family History: The patient's family history includes Alzheimer's disease in his mother; Cancer in his father; Drug abuse in his daughter; Heart disease in his father; Heart failure (age of onset: 6) in his father; Hyperlipidemia in his father; Hypertension in his father; Leukemia in his brother. There is no history of Early death, Kidney disease, or Stroke.   Review of Systems: Please see the history of present illness.   All other  systems are reviewed and negative.   Physical Exam: VS:  BP 104/60 mmHg  Pulse 70  Ht 6\' 2"  (1.88 m)  Wt 208 lb 6.4 oz (94.53 kg)  BMI 26.75 kg/m2 .  BMI Body mass index is 26.75 kg/(m^2).  Wt Readings from Last 3 Encounters:  04/17/14 208 lb 6.4 oz (94.53 kg)  02/23/14 212 lb 2 oz (96.219 kg)  02/20/14 211 lb 3.2 oz (95.8 kg)    General: Pleasant. Well developed, well nourished and in no acute distress. Color a little sallow.  HEENT: Normal. Neck: Supple, no JVD, carotid bruits, or masses noted.  Cardiac: Regular rate and rhythm.  No edema.  Respiratory:  Lungs are clear to auscultation bilaterally with  normal work of breathing.  GI: Soft and nontender.  MS: No deformity or atrophy. Gait and ROM intact. Skin: Warm and dry. Color is normal.  Neuro:  Strength and sensation are intact and no gross focal deficits noted.  Psych: Alert, appropriate and with normal affect.   LABORATORY DATA:   EKG:  EKG is   ordered today.  ECG demonstrated sinus rhythm at 71 intervals 19/12/39 Prior septal infarction Repolarization abnormalities lead 1, L, V5-6  unchanged from 2/16  Lab Results  Component Value Date   WBC 7.1 03/24/2014   HGB 13.1 03/24/2014   HCT 38.4* 03/24/2014   PLT 170.0 03/24/2014   GLUCOSE 88 02/20/2014   CHOL 123 02/14/2014   TRIG 99 02/14/2014   HDL 44 02/14/2014   LDLDIRECT 52.0 03/17/2011   LDLCALC 59 02/14/2014   ALT 45 11/29/2013   AST 37 11/29/2013   NA 138 02/20/2014   K 4.9 02/20/2014   CL 104 02/20/2014   CREATININE 1.47 02/20/2014   BUN 24* 02/20/2014   CO2 24 02/20/2014   TSH 0.276* 02/13/2014   PSA 1.86 04/16/2012   INR 2.88* 02/14/2014   HGBA1C 5.6 02/13/2014   MICROALBUR 8.12* 06/07/2013    BNP (last 3 results)  Recent Labs  02/13/14 1131  BNP 20.5    ProBNP (last 3 results) No results for input(s): PROBNP in the last 8760 hours.   .   Assessment/Plan: 1. Syncope,    2. Ischemic CM, s/p MI, s/p CABG - 3. ILBBB  3.  PAF  4. H/o atrial flutter s/p ablation  5. CKD/Hyperkalemia -   6. High risk medication surveillance  7.  ICD  Subcutaneous   He has had no recurrent syncope  Blood work was borderline at last visit. We will check it again in 2 months time.  No significant intercurrent atrial arrhythmias.  Continue on Rivaroxaban   Without symptoms of ischemia  Need to followup on Cr and K as noted      Current medicines are reviewed with the patient today.  The patient does not have  concerns regarding medicines other than what has been noted above.  The following changes have been made:  N/A Labs/ tests ordered today include: BMET in 2 months      [facility] [address] [place], [place]  [address] Phone: [phone] Fax: [phone]

## 2014-04-17 NOTE — Patient Instructions (Signed)
Your physician recommends that you continue on your current medications as directed. Please refer to the Current Medication list given to you today.  Your physician recommends that you return for lab work in: 2 months for a BMET and Magnesium.  Remote monitoring is used to monitor your Pacemaker of ICD from home. This monitoring reduces the number of office visits required to check your device to one time per year. It allows [place] to keep an eye on the functioning of your device to ensure it is working properly. You are scheduled for a device check from home on 07/18/14. You may send your transmission at any time that day. If you have a wireless device, the transmission will be sent automatically. After your physician reviews your transmission, you will receive a postcard with your next transmission date.  Your physician wants you to follow-up in: 5 months with Gypsy Balsam, NP.  You will receive a reminder letter in the mail two months in advance. If you don't receive a letter, please call our office to schedule the follow-up appointment.  Your physician wants you to follow-up in: 1 year with Dr. Graciela Husbands.  You will receive a reminder letter in the mail two months in advance. If you don't receive a letter, please call our office to schedule the follow-up appointment.

## 2014-04-20 ENCOUNTER — Encounter: Payer: Medicare HMO | Admitting: Internal Medicine

## 2014-04-23 ENCOUNTER — Other Ambulatory Visit: Payer: Self-pay | Admitting: Family Medicine

## 2014-04-24 ENCOUNTER — Telehealth: Payer: Self-pay | Admitting: *Deleted

## 2014-04-24 NOTE — Telephone Encounter (Signed)
Received order for diabetic shoes from [employer]. Forms filled out as much as possible and forwarded to Dr. Abner Greenspan. JG//CMA

## 2014-04-27 ENCOUNTER — Other Ambulatory Visit: Payer: Self-pay | Admitting: Family Medicine

## 2014-05-14 ENCOUNTER — Encounter: Payer: Self-pay | Admitting: Family Medicine

## 2014-05-18 ENCOUNTER — Other Ambulatory Visit: Payer: Self-pay | Admitting: Family Medicine

## 2014-05-26 ENCOUNTER — Encounter: Payer: Self-pay | Admitting: Family Medicine

## 2014-05-26 NOTE — Telephone Encounter (Addendum)
Caller name: Renier,Maria Relation to pt: spouse  Call back number: [phone] Pharmacy:   Reason for call:  Spouse states [employer] never received faxed.

## 2014-05-29 NOTE — Telephone Encounter (Signed)
Forms are with Dr. Abner Greenspan.

## 2014-06-20 ENCOUNTER — Other Ambulatory Visit (INDEPENDENT_AMBULATORY_CARE_PROVIDER_SITE_OTHER): Payer: Commercial Managed Care - HMO

## 2014-06-20 DIAGNOSIS — R55 Syncope and collapse: Secondary | ICD-10-CM | POA: Diagnosis not present

## 2014-06-20 DIAGNOSIS — I429 Cardiomyopathy, unspecified: Secondary | ICD-10-CM

## 2014-06-20 LAB — BASIC METABOLIC PANEL
BUN: 33 mg/dL — ABNORMAL HIGH (ref 6–23)
CALCIUM: 10.3 mg/dL (ref 8.4–10.5)
CO2: 26 meq/L (ref 19–32)
CREATININE: 1.64 mg/dL — AB (ref 0.40–1.50)
Chloride: 101 mEq/L (ref 96–112)
GFR: 44.88 mL/min — AB (ref >60.00)
Glucose, Bld: 119 mg/dL — ABNORMAL HIGH (ref 70–99)
POTASSIUM: 5.1 meq/L (ref 3.5–5.1)
Sodium: 135 mEq/L (ref 135–145)

## 2014-06-20 LAB — MAGNESIUM: Magnesium: 2.3 mg/dL (ref 1.5–2.5)

## 2014-06-22 ENCOUNTER — Other Ambulatory Visit: Payer: Self-pay | Admitting: *Deleted

## 2014-06-22 DIAGNOSIS — I10 Essential (primary) hypertension: Secondary | ICD-10-CM

## 2014-06-22 DIAGNOSIS — Z79899 Other long term (current) drug therapy: Secondary | ICD-10-CM

## 2014-06-29 ENCOUNTER — Other Ambulatory Visit (INDEPENDENT_AMBULATORY_CARE_PROVIDER_SITE_OTHER): Payer: Commercial Managed Care - HMO | Admitting: *Deleted

## 2014-06-29 DIAGNOSIS — I10 Essential (primary) hypertension: Secondary | ICD-10-CM | POA: Diagnosis not present

## 2014-06-29 DIAGNOSIS — Z79899 Other long term (current) drug therapy: Secondary | ICD-10-CM | POA: Diagnosis not present

## 2014-06-29 LAB — BASIC METABOLIC PANEL
BUN: 27 mg/dL — ABNORMAL HIGH (ref 6–23)
CO2: 26 meq/L (ref 19–32)
CREATININE: 1.78 mg/dL — AB (ref 0.40–1.50)
Calcium: 9.6 mg/dL (ref 8.4–10.5)
Chloride: 107 mEq/L (ref 96–112)
GFR: 40.83 mL/min — ABNORMAL LOW (ref >60.00)
GLUCOSE: 103 mg/dL — AB (ref 70–99)
POTASSIUM: 4.5 meq/L (ref 3.5–5.1)
SODIUM: 139 meq/L (ref 135–145)

## 2014-07-11 ENCOUNTER — Other Ambulatory Visit: Payer: Self-pay | Admitting: Internal Medicine

## 2014-07-13 ENCOUNTER — Other Ambulatory Visit: Payer: Self-pay

## 2014-07-13 MED ORDER — FUROSEMIDE 40 MG PO TABS: ORAL_TABLET | ORAL | Status: DC

## 2014-07-13 NOTE — Telephone Encounter (Signed)
Per note 4.4.16 

## 2014-07-18 ENCOUNTER — Ambulatory Visit (INDEPENDENT_AMBULATORY_CARE_PROVIDER_SITE_OTHER): Payer: Commercial Managed Care - HMO | Admitting: *Deleted

## 2014-07-18 DIAGNOSIS — I429 Cardiomyopathy, unspecified: Secondary | ICD-10-CM

## 2014-07-18 NOTE — Progress Notes (Signed)
Remote ICD transmission.   

## 2014-07-23 ENCOUNTER — Other Ambulatory Visit: Payer: Self-pay | Admitting: Family Medicine

## 2014-07-23 LAB — CUP PACEART REMOTE DEVICE CHECK
Battery Remaining Percentage: 92 %
Date Time Interrogation Session: 20160705124600
MDC IDC SET ZONE DETECTION INTERVAL: 333 ms
Pulse Gen Serial Number: 108164
Zone Setting Detection Interval: 250 ms

## 2014-07-23 NOTE — Telephone Encounter (Signed)
He can have a 30 day refill on his Klonopin but August will be 6 months since he was seen so he will have to return to refill further

## 2014-07-24 NOTE — Telephone Encounter (Signed)
Printed and on counter for signature. Faxed hardcopy for Cl[facility]azep[facility] to [facility].  Patient informed to schedule appt.

## 2014-07-28 ENCOUNTER — Telehealth: Payer: Self-pay

## 2014-07-28 NOTE — Telephone Encounter (Signed)
Called to schedule Medicare Wellness Visit with [facility] Coach.  Left a message for call back.  

## 2014-08-04 ENCOUNTER — Other Ambulatory Visit: Payer: Self-pay | Admitting: Physician Assistant

## 2014-08-12 ENCOUNTER — Other Ambulatory Visit: Payer: Self-pay | Admitting: Physician Assistant

## 2014-08-15 NOTE — Telephone Encounter (Signed)
Appointment scheduled.  Pt is unsure of whether or not he can make it.  He just started a new job, but stated he would call back if appointment conflicts.

## 2014-08-18 ENCOUNTER — Encounter: Payer: Self-pay | Admitting: Cardiology

## 2014-08-21 ENCOUNTER — Other Ambulatory Visit: Payer: Self-pay | Admitting: Physician Assistant

## 2014-08-21 NOTE — Telephone Encounter (Signed)
Pt requesting Rx, [place] pt. 

## 2014-08-21 NOTE — Telephone Encounter (Signed)
Please review for refill, [place] pt. 

## 2014-08-24 ENCOUNTER — Other Ambulatory Visit: Payer: Self-pay | Admitting: Internal Medicine

## 2014-08-24 MED ORDER — RIVAROXABAN 20 MG PO TABS: 20.0000 mg | ORAL_TABLET | Freq: Every day | ORAL | Status: DC

## 2014-08-28 ENCOUNTER — Encounter: Payer: Self-pay | Admitting: Internal Medicine

## 2014-09-12 ENCOUNTER — Other Ambulatory Visit: Payer: Self-pay | Admitting: Family Medicine

## 2014-09-12 NOTE — Telephone Encounter (Signed)
Faxed hardcopy for clonazepam to [facility] [address] [place]

## 2014-09-25 ENCOUNTER — Telehealth: Payer: Self-pay | Admitting: Internal Medicine

## 2014-09-25 NOTE — Telephone Encounter (Signed)
Called pt back and he stated that his problem solved itself.

## 2014-09-25 NOTE — Telephone Encounter (Signed)
New message      Pt states he is having trouble with his remote transmission box.  Please call

## 2014-10-18 ENCOUNTER — Other Ambulatory Visit: Payer: Self-pay | Admitting: Family Medicine

## 2014-10-19 ENCOUNTER — Ambulatory Visit (INDEPENDENT_AMBULATORY_CARE_PROVIDER_SITE_OTHER): Payer: Commercial Managed Care - HMO | Admitting: *Deleted

## 2014-10-19 DIAGNOSIS — I429 Cardiomyopathy, unspecified: Secondary | ICD-10-CM

## 2014-10-19 NOTE — Progress Notes (Signed)
Remote ICD transmission.   

## 2014-11-03 ENCOUNTER — Telehealth: Payer: Self-pay

## 2014-11-03 NOTE — Telephone Encounter (Signed)
Patient called in requesting assistance with the cost of his Xarelto 20 mg.  I provided him with the patient assistance program paperwork and 4 bottles of samples.

## 2014-11-08 LAB — CUP PACEART REMOTE DEVICE CHECK
Battery Remaining Percentage: 89 %
Implantable Lead Location: 753862
MDC IDC LEAD IMPLANT DT: 20151029
MDC IDC LEAD MODEL: 3401
MDC IDC SESS DTM: 20161006125000
Pulse Gen Serial Number: 108164

## 2014-11-23 ENCOUNTER — Telehealth: Payer: Self-pay | Admitting: Behavioral Health

## 2014-11-23 NOTE — Telephone Encounter (Signed)
Patient confirmed appointment for Medicare Wellness Visit, 11/24/14 at 9:30 AM.

## 2014-11-24 ENCOUNTER — Ambulatory Visit (INDEPENDENT_AMBULATORY_CARE_PROVIDER_SITE_OTHER): Payer: Commercial Managed Care - HMO

## 2014-11-24 VITALS — BP 112/64 | HR 70 | Ht 74.0 in | Wt 216.0 lb

## 2014-11-24 DIAGNOSIS — E669 Obesity, unspecified: Secondary | ICD-10-CM

## 2014-11-24 DIAGNOSIS — Z0000 Encounter for general adult medical examination without abnormal findings: Secondary | ICD-10-CM

## 2014-11-24 DIAGNOSIS — Z0100 Encounter for examination of eyes and vision without abnormal findings: Secondary | ICD-10-CM

## 2014-11-24 DIAGNOSIS — E1169 Type 2 diabetes mellitus with other specified complication: Secondary | ICD-10-CM

## 2014-11-24 DIAGNOSIS — E119 Type 2 diabetes mellitus without complications: Secondary | ICD-10-CM

## 2014-11-24 NOTE — Patient Instructions (Addendum)
Follow up with Dr. Abner Greenspan as scheduled.  Continue to eat heart healthy diet (full of fruits, vegetables, whole grains, lean protein, water--limit salt, fat, and sugar intake) and increase physical activity as tolerated.  Schedule Eye exam.  You should here back from [place] regarding referral.    Health Maintenance, Male A healthy lifestyle and preventative care can promote health and wellness.  Maintain regular health, dental, and eye exams.  Eat a healthy diet. Foods like vegetables, fruits, whole grains, low-fat dairy products, and lean protein foods contain the nutrients you need and are low in calories. Decrease your intake of foods high in solid fats, added sugars, and salt. Get information about a proper diet from your health care provider, if necessary.  Regular physical exercise is one of the most important things you can do for your health. Most adults should get at least 150 minutes of moderate-intensity exercise (any activity that increases your heart rate and causes you to sweat) each week. In addition, most adults need muscle-strengthening exercises on 2 or more days a week.   Maintain a healthy weight. The body mass index (BMI) is a screening tool to identify possible weight problems. It provides an estimate of body fat based on height and weight. Your health care provider can find your BMI and can help you achieve or maintain a healthy weight. For males 20 years and older:  A BMI below 18.5 is considered underweight.  A BMI of 18.5 to 24.9 is normal.  A BMI of 25 to 29.9 is considered overweight.  A BMI of 30 and above is considered obese.  Maintain normal blood lipids and cholesterol by exercising and minimizing your intake of saturated fat. Eat a balanced diet with plenty of fruits and vegetables. Blood tests for lipids and cholesterol should begin at age 12 and be repeated every 5 years. If your lipid or cholesterol levels are high, you are over age 33, or you are at high risk  for heart disease, you may need your cholesterol levels checked more frequently.Ongoing high lipid and cholesterol levels should be treated with medicines if diet and exercise are not working.  If you smoke, find out from your health care provider how to quit. If you do not use tobacco, do not start.  Lung cancer screening is recommended for adults aged 55-80 years who are at high risk for developing lung cancer because of a history of smoking. A yearly low-dose CT scan of the lungs is recommended for people who have at least a 30-pack-year history of smoking and are current smokers or have quit within the past 15 years. A pack year of smoking is smoking an average of 1 pack of cigarettes a day for 1 year (for example, a 30-pack-year history of smoking could mean smoking 1 pack a day for 30 years or 2 packs a day for 15 years). Yearly screening should continue until the smoker has stopped smoking for at least 15 years. Yearly screening should be stopped for people who develop a health problem that would prevent them from having lung cancer treatment.  If you choose to drink alcohol, do not have more than 2 drinks per day. One drink is considered to be 12 oz (360 mL) of beer, 5 oz (150 mL) of wine, or 1.5 oz (45 mL) of liquor.  Avoid the use of street drugs. Do not share needles with anyone. Ask for help if you need support or instructions about stopping the use of drugs.  High  blood pressure causes heart disease and increases the risk of stroke. High blood pressure is more likely to develop in:  People who have blood pressure in the end of the normal range (100-139/85-89 mm Hg).  People who are overweight or obese.  People who are African American.  If you are 77-52 years of age, have your blood pressure checked every 3-5 years. If you are 68 years of age or older, have your blood pressure checked every year. You should have your blood pressure measured twice--once when you are at a hospital or  clinic, and once when you are not at a hospital or clinic. Record the average of the two measurements. To check your blood pressure when you are not at a hospital or clinic, you can use:  An automated blood pressure machine at a pharmacy.  A home blood pressure monitor.  If you are 60-76 years old, ask your health care provider if you should take aspirin to prevent heart disease.  Diabetes screening involves taking a blood sample to check your fasting blood sugar level. This should be done once every 3 years after age 2 if you are at a normal weight and without risk factors for diabetes. Testing should be considered at a younger age or be carried out more frequently if you are overweight and have at least 1 risk factor for diabetes.  Colorectal cancer can be detected and often prevented. Most routine colorectal cancer screening begins at the age of 58 and continues through age 42. However, your health care provider may recommend screening at an earlier age if you have risk factors for colon cancer. On a yearly basis, your health care provider may provide home test kits to check for hidden blood in the stool. A small camera at the end of a tube may be used to directly examine the colon (sigmoidoscopy or colonoscopy) to detect the earliest forms of colorectal cancer. Talk to your health care provider about this at age 71 when routine screening begins. A direct exam of the colon should be repeated every 5-10 years through age 33, unless early forms of precancerous polyps or small growths are found.  People who are at an increased risk for hepatitis B should be screened for this virus. You are considered at high risk for hepatitis B if:  You were born in a country where hepatitis B occurs often. Talk with your health care provider about which countries are considered high risk.  Your parents were born in a high-risk country and you have not received a shot to protect against hepatitis B (hepatitis B  vaccine).  You have HIV or AIDS.  You use needles to inject street drugs.  You live with, or have sex with, someone who has hepatitis B.  You are a man who has sex with other men (MSM).  You get hemodialysis treatment.  You take certain medicines for conditions like cancer, organ transplantation, and autoimmune conditions.  Hepatitis C blood testing is recommended for all people born from [birth year] through 1967 and any individual with known risk factors for hepatitis C.  Healthy men should no longer receive prostate-specific antigen (PSA) blood tests as part of routine cancer screening. Talk to your health care provider about prostate cancer screening.  Testicular cancer screening is not recommended for adolescents or adult males who have no symptoms. Screening includes self-exam, a health care provider exam, and other screening tests. Consult with your health care provider about any symptoms you have or any concerns  you have about testicular cancer.  Practice safe sex. Use condoms and avoid high-risk sexual practices to reduce the spread of sexually transmitted infections (STIs).  You should be screened for STIs, including gonorrhea and chlamydia if:  You are sexually active and are younger than 24 years.  You are older than 24 years, and your health care provider tells you that you are at risk for this type of infection.  Your sexual activity has changed since you were last screened, and you are at an increased risk for chlamydia or gonorrhea. Ask your health care provider if you are at risk.  If you are at risk of being infected with HIV, it is recommended that you take a prescription medicine daily to prevent HIV infection. This is called pre-exposure prophylaxis (PrEP). You are considered at risk if:  You are a man who has sex with other men (MSM).  You are a heterosexual man who is sexually active with multiple partners.  You take drugs by injection.  You are sexually active  with a partner who has HIV.  Talk with your health care provider about whether you are at high risk of being infected with HIV. If you choose to begin PrEP, you should first be tested for HIV. You should then be tested every 3 months for as long as you are taking PrEP.  Use sunscreen. Apply sunscreen liberally and repeatedly throughout the day. You should seek shade when your shadow is shorter than you. Protect yourself by wearing long sleeves, pants, a wide-brimmed hat, and sunglasses year round whenever you are outdoors.  Tell your health care provider of new moles or changes in moles, especially if there is a change in shape or color. Also, tell your health care provider if a mole is larger than the size of a pencil eraser.  A one-time screening for abdominal aortic aneurysm (AAA) and surgical repair of large AAAs by ultrasound is recommended for men aged 41-75 years who are current or former smokers.  Stay current with your vaccines (immunizations).   This information is not intended to replace advice given to you by your health care provider. Make sure you discuss any questions you have with your health care provider.   Document Released: 06/28/2007 Document Revised: 01/20/2014 Document Reviewed: 05/27/2010 Elsevier Interactive Patient Education [employer].

## 2014-11-24 NOTE — Progress Notes (Signed)
Pre visit review using our clinic review tool, if applicable. No additional management support is needed unless otherwise documented below in the visit note. 

## 2014-11-24 NOTE — Progress Notes (Signed)
Subjective:   Larry Rangel is a 64 y.o. male who presents for an Initial Medicare Annual Wellness Visit.  Describes overall health as pretty good, however says he has been experiencing shortness of breath at rest on and off x 2 months.   Review of Systems: No ROS  Cardiac Risk Factors include: advanced age (>76men, >40 women);diabetes mellitus;hypertension;male gender;family history of premature cardiovascular disease Sleep patterns:  Sleeps 7-8 hours per night/occasionally has to get up once to void.   Home Safety/Smoke Alarms:  Feels safe at home. Lives with wife in 2 story home.  Smoke alarms present.   Firearm Safety:  Kept in safe place.   Seat Belt Safety/Bike Helmet:  Always wears seat belt.    Counseling:   Eye Exam- 2-3 years ago--Ophthalmology referral placed.   Dental- Once a year.   Male:  CCS-01/13/06-normal; repeat in 10 year.    PSA- 04/26/12-1.86     Objective:    Today's Vitals   11/24/14 0929  BP: 112/64  Pulse: 70  Height:  (1.88 m)  Weight: 216 lb (97.977 kg)  SpO2: 97%  PainSc: 0-No pain    Current Medications (verified) Outpatient Encounter Prescriptions as of 11/24/2014  Medication Sig  . atorvastatin (LIPITOR) 20 MG tablet TAKE 1 TABLET (20 MG TOTAL) BY MOUTH DAILY.  . busPIRone (BUSPAR) 10 MG tablet TAKE 1 TABLET BY MOUTH TWICE A DAY  . carvedilol (COREG) 12.5 MG tablet TAKE 1 TABLET BY MOUTH TWICE DAILY WITH A MEAL  . clonazePAM (KLONOPIN) 0.5 MG tablet TAKE 1 TABLET BY MOUTH 3 TIMES A DAY  . furosemide (LASIX) 40 MG tablet TAKE 1 TABLET BY MOUTH DAILY. IF WEIGHT INCREASED > 3LBS OVERNIGHT INCREASE BY ONE TABLET  . glipiZIDE (GLUCOTROL) 5 MG tablet TAKE 1/2 TABLET (2.5 MG TOTAL) BY MOUTH ONCE A DAY BEFORE BREAKFAST  . irbesartan (AVAPRO) 150 MG tablet Take 1 tablet (150 mg total) by mouth daily.  . metFORMIN (GLUCOPHAGE) 500 MG tablet Take 1 tablet (500 mg total) by mouth 2 (two) times daily with a meal.  . OVER THE COUNTER MEDICATION Place  1 drop into both eyes as needed (for dry eyes). "OTC Good Sense Eye Drops"  . rivaroxaban (XARELTO) 20 MG TABS tablet Take 1 tablet (20 mg total) by mouth daily with supper.  . [DISCONTINUED] spironolactone (ALDACTONE) 25 MG tablet Take 0.5 tablets (12.5 mg total) by mouth daily. (Patient not taking: Reported on 11/24/2014)   No facility-administered encounter medications on file as of 11/24/2014.    Allergies (verified) Review of patient's allergies indicates no known allergies.   History: Past Medical History  Diagnosis Date  . Anxiety   . Hypertension   . Low back pain   . Tobacco user   . CAD (coronary artery disease)     a. s/p CABG 2009. b. Cath 07/2013: 3/5 patent grafts (appear to have chronic occ grafts), mod diag/L PL branch, did not appear flow limiting, elevated filling pressures.  . Hyperlipidemia   . S/P colonoscopy   . Myocardial infarction (HCC)   . Arthritis   . Paroxysmal atrial flutter (HCC) 07-2012    a. s/p ablation by Dr Graciela Husbands 08-06-2012. b. Recurrence 07/2013.   Marland Kitchen Chicken pox as a child  . Measles as a child  . Mumps as a child  . Abnormal liver function 08/23/2010  . Chronic combined systolic and diastolic CHF (congestive heart failure) (HCC)     a. Echo 6/14: Mild LVH, EF 30-35%,  diffuse HK, MAC, mild BAE. b. Drop in EF to 20% by echo 07/2013.  . Diabetes mellitus (HCC)   . PAF (paroxysmal atrial fibrillation) (HCC)   . LBBB (left bundle branch block)    Past Surgical History  Procedure Laterality Date  . Shoulder surgery    . Coronary artery bypass graft  2009    x 5  . Tee without cardioversion N/A 07/15/2012    Procedure: TRANSESOPHAGEAL ECHOCARDIOGRAM (TEE);  Surgeon: Vesta Mixer, MD;  Location: [facility] ENDOSCOPY;  Service: Cardiovascular;  Laterality: N/A;  . Cardioversion N/A 07/15/2012    Procedure: CARDIOVERSION;  Surgeon: Vesta Mixer, MD;  Location: [facility] ENDOSCOPY;  Service: Cardiovascular;  Laterality: N/A;  . Ablation of dysrhythmic focus   08/06/2012    CTI ablation by Dr Graciela Husbands  . Tonsillectomy    . Cardiac catheterization    . Atrial flutter ablation N/A 08/06/2012    Procedure: ATRIAL FLUTTER ABLATION;  Surgeon: Duke Salvia, MD;  Location: [facility] CATH LAB;  Service: Cardiovascular;  Laterality: N/A;  . Left heart catheterization with coronary angiogram N/A 08/01/2013    Procedure: LEFT HEART CATHETERIZATION WITH CORONARY ANGIOGRAM;  Surgeon: Kathleene Hazel, MD;  Location: [facility] CATH LAB;  Service: Cardiovascular;  Laterality: N/A;  . Implantable cardioverter defibrillator implant N/A 11/10/2013    Procedure: SUB Q IMPLANTABLE CARDIOVERTER DEFIBRILLATOR IMPLANT;  Surgeon: Duke Salvia, MD;  Location: [facility] CATH LAB;  Service: Cardiovascular;  Laterality: N/A;  . Pocket revision N/A 12/16/2013    Procedure: POCKET REVISION;  Surgeon: Marinus Maw, MD;  Location: [facility] CATH LAB;  Service: Cardiovascular;  Laterality: N/A;   Family History  Problem Relation Age of Onset  . Alzheimer's disease Mother   . Heart failure Father 17  . Hypertension Father   . Hyperlipidemia Father   . Cancer Father     lung- took half of a young- smoker  . Heart disease Father     MI at 77  . Early death Neg Hx   . Kidney disease Neg Hx   . Stroke Neg Hx   . Leukemia Brother   . Drug abuse Daughter     heroine   Social History   Occupational History  . Not on file.   Social History Main Topics  . Smoking status: Former Smoker -- 1.00 packs/day for 40 years    Types: Cigarettes    Quit date: 02/01/2011  . Smokeless tobacco: Never Used  . Alcohol Use: 14.4 oz/week    24 Glasses of wine per week     Comment: drinks 2 bottles of wine per week  . Drug Use: No  . Sexual Activity: Yes   Tobacco Counseling Counseling given: Yes   Activities of Daily Living In your present state of health, do you have any difficulty performing the following activities: 11/24/2014 02/14/2014  Hearing? N -  Vision? N -  Difficulty concentrating or  making decisions? N -  Walking or climbing stairs? N -  Dressing or bathing? N -  Doing errands, shopping? N N  Preparing Food and eating ? N -  Using the Toilet? N -  In the past six months, have you accidently leaked urine? N -  Do you have problems with loss of bowel control? N -  Managing your Medications? N -  Managing your Finances? N -  Housekeeping or managing your Housekeeping? N -    Immunizations and Health Maintenance Immunization History  Administered Date(s) Administered  . Td  01/13/2009   Health Maintenance Due  Topic Date Due  . OPHTHALMOLOGY EXAM  04/16/2013  . URINE MICROALBUMIN  06/08/2014  . HEMOGLOBIN A1C  08/14/2014    Patient Care Team: Bradd Canary, MD as PCP - General (Family Medicine) Duke Salvia, MD as Consulting Physician (Cardiology)  Indicate any recent Medical Services you may have received from other than [facility] providers in the past year (date may be approximate).    Assessment:  This is a routine wellness examination for Sricharan.   Diabetes mellitus- Last A1C-5.6 on 02/13/14; on glipizide and metformin; Does not monitor blood sugars at home.  Encouraged to follow up with Dr. Abner Greenspan.  Hearing/Vision screen  Hearing Screening           Right ear:   Pass Pass Pass Pass   Left ear:   Pass Pass Pass Pass   Comments: No hearing difficulties.    Vision Screening Comments: No changes in vision Wears prescriptive glasses when driving at night particularly during rain storms.   Last Eye Exam: 2-3 years ago  Dietary issues and exercise activities discussed: Current Exercise Habits:: The patient has a physically strenous job, but has no regular exercise apart from work. (Stays active--working part time )   Diet:  2 medium size, well balanced meals per day. Mostly cooks.  Rarely eats out.  Drinks plenty of water.    Goals    . Complete Eye Exam by the end of next year.        Depression Screen PHQ 2/9  Scores 11/24/2014 06/12/2013 06/07/2013  PHQ - 2 Score 0 0 0    Fall Risk Fall Risk  11/24/2014 06/12/2013 06/07/2013  Falls in the past year? No No No    Cognitive Function: MMSE - Mini Mental State Exam 11/24/2014  Orientation to time 5  Orientation to Place 5  Registration 3  Attention/ Calculation 5  Recall 2  Language- name 2 objects 2  Language- repeat 1  Language- follow 3 step command 3  Language- read & follow direction 1  Write a sentence 1  Copy design 1  Total score 29    Screening Tests Health Maintenance  Topic Date Due  . OPHTHALMOLOGY EXAM  04/16/2013  . URINE MICROALBUMIN  06/08/2014  . HEMOGLOBIN A1C  08/14/2014  . FOOT EXAM  12/03/2014 (Originally 11/12/2013)  . INFLUENZA VACCINE  01/12/2015 (Originally 08/14/2014)  . ZOSTAVAX  01/12/2015 (Originally 05/09/2008)  . PNA vac Low Risk Adult (1 of 2 - PCV13) 01/12/2015 (Originally 05/09/2013)  . COLONOSCOPY  01/14/2016  . TETANUS/TDAP  01/14/2019  . Hepatitis C Screening  Completed        Plan:  Follow up with Dr. Abner Greenspan as scheduled.  Continue to eat heart healthy diet (full of fruits, vegetables, whole grains, lean protein, water--limit salt, fat, and sugar intake) and increase physical activity as tolerated.  Schedule Eye exam.  You should here back from [place] regarding referral.     During the course of the visit Levell was educated and counseled about the following appropriate screening and preventive services:   Vaccines to include Pneumoccal, Influenza, Hepatitis B, Td, Zostavax, HCV  Electrocardiogram  Colorectal cancer screening  Cardiovascular disease screening  Diabetes screening  Glaucoma screening  Nutrition counseling  Prostate cancer screening  Smoking cessation counseling  Patient Instructions (the written plan) were given to the patient.   Tylene Fantasia, RN   11/24/2014

## 2014-11-26 ENCOUNTER — Other Ambulatory Visit: Payer: Self-pay | Admitting: Family Medicine

## 2014-11-26 NOTE — Telephone Encounter (Signed)
OK to refill Klonopin without additional rf til seen

## 2014-11-27 NOTE — Telephone Encounter (Signed)
Phoned in refill for Klonopin 0.5 mg #90 with 0 refills to [facility] [address] [place].

## 2014-12-03 ENCOUNTER — Other Ambulatory Visit: Payer: Self-pay | Admitting: Family Medicine

## 2014-12-04 ENCOUNTER — Telehealth: Payer: Self-pay

## 2014-12-04 NOTE — Telephone Encounter (Signed)
Left message for patient to return call regarding Pre-Visit

## 2014-12-04 NOTE — Telephone Encounter (Signed)
Previsit  Call completed.

## 2014-12-05 ENCOUNTER — Encounter: Payer: Self-pay | Admitting: Family Medicine

## 2014-12-05 ENCOUNTER — Ambulatory Visit (INDEPENDENT_AMBULATORY_CARE_PROVIDER_SITE_OTHER): Payer: Commercial Managed Care - HMO | Admitting: Family Medicine

## 2014-12-05 VITALS — BP 142/80 | HR 82 | Temp 98.5°F | Ht 74.0 in | Wt 220.0 lb

## 2014-12-05 DIAGNOSIS — E1169 Type 2 diabetes mellitus with other specified complication: Secondary | ICD-10-CM

## 2014-12-05 DIAGNOSIS — I10 Essential (primary) hypertension: Secondary | ICD-10-CM

## 2014-12-05 DIAGNOSIS — I480 Paroxysmal atrial fibrillation: Secondary | ICD-10-CM

## 2014-12-05 DIAGNOSIS — E785 Hyperlipidemia, unspecified: Secondary | ICD-10-CM | POA: Diagnosis not present

## 2014-12-05 DIAGNOSIS — E119 Type 2 diabetes mellitus without complications: Secondary | ICD-10-CM

## 2014-12-05 DIAGNOSIS — R0602 Shortness of breath: Secondary | ICD-10-CM | POA: Diagnosis not present

## 2014-12-05 DIAGNOSIS — Z87891 Personal history of nicotine dependence: Secondary | ICD-10-CM

## 2014-12-05 DIAGNOSIS — I4892 Unspecified atrial flutter: Secondary | ICD-10-CM

## 2014-12-05 DIAGNOSIS — R945 Abnormal results of liver function studies: Secondary | ICD-10-CM

## 2014-12-05 DIAGNOSIS — E669 Obesity, unspecified: Secondary | ICD-10-CM

## 2014-12-05 DIAGNOSIS — Z0000 Encounter for general adult medical examination without abnormal findings: Secondary | ICD-10-CM

## 2014-12-05 LAB — CBC
HCT: 45 % (ref 39.0–52.0)
Hemoglobin: 14.9 g/dL (ref 13.0–17.0)
MCHC: 33 g/dL (ref 30.0–36.0)
MCV: 94.3 fl (ref 78.0–100.0)
Platelets: 169 10*3/uL (ref 150.0–400.0)
RBC: 4.77 Mil/uL (ref 4.22–5.81)
RDW: 13.6 % (ref 11.5–15.5)
WBC: 9.4 10*3/uL (ref 4.0–10.5)

## 2014-12-05 LAB — LIPID PANEL
CHOLESTEROL: 115 mg/dL (ref 0–200)
HDL: 30 mg/dL — ABNORMAL LOW (ref >39.00)
NonHDL: 85.04
Total CHOL/HDL Ratio: 4
Triglycerides: 252 mg/dL — ABNORMAL HIGH (ref 0.0–149.0)
VLDL: 50.4 mg/dL — ABNORMAL HIGH (ref 0.0–40.0)

## 2014-12-05 LAB — COMPREHENSIVE METABOLIC PANEL
ALBUMIN: 4 g/dL (ref 3.5–5.2)
ALT: 45 U/L (ref 0–53)
AST: 62 U/L — AB (ref 0–37)
Alkaline Phosphatase: 73 U/L (ref 39–117)
BILIRUBIN TOTAL: 0.9 mg/dL (ref 0.2–1.2)
BUN: 9 mg/dL (ref 6–23)
CALCIUM: 9.4 mg/dL (ref 8.4–10.5)
CO2: 28 mEq/L (ref 19–32)
CREATININE: 1.09 mg/dL (ref 0.40–1.50)
Chloride: 103 mEq/L (ref 96–112)
GFR: 71.81 mL/min (ref >60.00)
Glucose, Bld: 152 mg/dL — ABNORMAL HIGH (ref 70–99)
Potassium: 3.7 mEq/L (ref 3.5–5.1)
Sodium: 139 mEq/L (ref 135–145)
TOTAL PROTEIN: 7.2 g/dL (ref 6.0–8.3)

## 2014-12-05 LAB — TSH: TSH: 0.86 u[IU]/mL (ref 0.35–4.50)

## 2014-12-05 LAB — LDL CHOLESTEROL, DIRECT: Direct LDL: 62 mg/dL

## 2014-12-05 LAB — HEMOGLOBIN A1C: HEMOGLOBIN A1C: 5.8 % (ref 4.6–6.5)

## 2014-12-05 LAB — MICROALBUMIN / CREATININE URINE RATIO
CREATININE, U: 275.3 mg/dL
MICROALB UR: 3.3 mg/dL — AB (ref 0.0–1.9)
Microalb Creat Ratio: 1.2 mg/g (ref 0.0–30.0)

## 2014-12-05 MED ORDER — ALBUTEROL SULFATE HFA 108 (90 BASE) MCG/ACT IN AERS: 2.0000 | INHALATION_SPRAY | Freq: Four times a day (QID) | RESPIRATORY_TRACT | Status: AC | PRN

## 2014-12-05 NOTE — Patient Instructions (Signed)

## 2014-12-05 NOTE — Progress Notes (Signed)
Pre visit review using our clinic review tool, if applicable. No additional management support is needed unless otherwise documented below in the visit note. 

## 2014-12-06 ENCOUNTER — Encounter: Payer: Self-pay | Admitting: *Deleted

## 2014-12-10 ENCOUNTER — Encounter: Payer: Self-pay | Admitting: Family Medicine

## 2014-12-10 DIAGNOSIS — Z0000 Encounter for general adult medical examination without abnormal findings: Secondary | ICD-10-CM

## 2014-12-10 DIAGNOSIS — Z7189 Other specified counseling: Secondary | ICD-10-CM | POA: Insufficient documentation

## 2014-12-10 HISTORY — DX: Encounter for general adult medical examination without abnormal findings: Z00.00

## 2014-12-10 NOTE — Assessment & Plan Note (Signed)
Patient denies any difficulties at home. No trouble with ADLs, depression or falls. See EMR for functional status screen and depression screen. No recent changes to vision or hearing. Is UTD with immunizations. Is UTD with screening. Discussed Advanced Directives. Encouraged heart healthy diet, exercise as tolerated and adequate sleep. See patient's problem list for health risk factors to monitor. See AVS for preventative healthcare recommendation schedule.labs ordered.   Immunization Status: reviewd  Prompted for insurance verification: Yes Flu vaccine--Refused Tdap--Refuses PNA--Ref Shingles--  A/P:  Changes to FH, PSH or Personal Hx: CCS: PSA: Bone Density:  Care Teams Updated: ED/Hospital/Urgent Care Visits: Prompted for: Updated insurance, contact information, forms: Remind to bring: DPR information, advance directives:

## 2014-12-10 NOTE — Assessment & Plan Note (Signed)
Tolerating statin, encouraged heart healthy diet, avoid trans fats, minimize simple carbs and saturated fats. Increase exercise as tolerated 

## 2014-12-10 NOTE — Assessment & Plan Note (Signed)
Tolerating Xarelto. Rate controlled 

## 2014-12-10 NOTE — Assessment & Plan Note (Signed)
hgba1c acceptable, minimize simple carbs. Increase exercise as tolerated. Continue current meds 

## 2014-12-10 NOTE — Progress Notes (Signed)
Subjective:    Patient ID: Larry Rangel, male    DOB: 10/07/1950, 64 y.o.   MRN: [id number]  Chief Complaint  Patient presents with  . Annual Exam    HPI Patient is in today for annual exam and follow up. No recent illness and is trying to exercise more. Notes feels SOB after just 5 minutes of exertion. Is tolerating Xarelto. No acute concerns. He has retired. Colonoscopy last in 2008 repeat in 2018. Is doing well with ADLs at home. Denies CP/palp/SOB/HA/congestion/fevers/GI or GU c/o. Taking meds as prescribed  Past Medical History  Diagnosis Date  . Anxiety   . Hypertension   . Low back pain   . Tobacco user   . CAD (coronary artery disease)     a. s/p CABG 2009. b. Cath 07/2013: 3/5 patent grafts (appear to have chronic occ grafts), mod diag/L PL branch, did not appear flow limiting, elevated filling pressures.  . Hyperlipidemia   . S/P colonoscopy   . Myocardial infarction (HCC)   . Arthritis   . Paroxysmal atrial flutter (HCC) 07-2012    a. s/p ablation by Dr Graciela Husbands 08-06-2012. b. Recurrence 07/2013.   Marland Kitchen Chicken pox as a child  . Measles as a child  . Mumps as a child  . Abnormal liver function 08/23/2010  . Chronic combined systolic and diastolic CHF (congestive heart failure) (HCC)     a. Echo 6/14: Mild LVH, EF 30-35%, diffuse HK, MAC, mild BAE. b. Drop in EF to 20% by echo 07/2013.  . Diabetes mellitus (HCC)   . PAF (paroxysmal atrial fibrillation) (HCC)   . LBBB (left bundle branch block)   . Preventative health care 12/10/2014    Past Surgical History  Procedure Laterality Date  . Shoulder surgery    . Coronary artery bypass graft  2009    x 5  . Tee without cardioversion N/A 07/15/2012    Procedure: TRANSESOPHAGEAL ECHOCARDIOGRAM (TEE);  Surgeon: Vesta Mixer, MD;  Location: [facility] ENDOSCOPY;  Service: Cardiovascular;  Laterality: N/A;  . Cardioversion N/A 07/15/2012    Procedure: CARDIOVERSION;  Surgeon: Vesta Mixer, MD;  Location: [facility] ENDOSCOPY;  Service:  Cardiovascular;  Laterality: N/A;  . Ablation of dysrhythmic focus  08/06/2012    CTI ablation by Dr Graciela Husbands  . Tonsillectomy    . Cardiac catheterization    . Atrial flutter ablation N/A 08/06/2012    Procedure: ATRIAL FLUTTER ABLATION;  Surgeon: Duke Salvia, MD;  Location: [facility] CATH LAB;  Service: Cardiovascular;  Laterality: N/A;  . Left heart catheterization with coronary angiogram N/A 08/01/2013    Procedure: LEFT HEART CATHETERIZATION WITH CORONARY ANGIOGRAM;  Surgeon: Kathleene Hazel, MD;  Location: [facility] CATH LAB;  Service: Cardiovascular;  Laterality: N/A;  . Implantable cardioverter defibrillator implant N/A 11/10/2013    Procedure: SUB Q IMPLANTABLE CARDIOVERTER DEFIBRILLATOR IMPLANT;  Surgeon: Duke Salvia, MD;  Location: [facility] CATH LAB;  Service: Cardiovascular;  Laterality: N/A;  . Pocket revision N/A 12/16/2013    Procedure: POCKET REVISION;  Surgeon: Marinus Maw, MD;  Location: [facility] CATH LAB;  Service: Cardiovascular;  Laterality: N/A;    Family History  Problem Relation Age of Onset  . Alzheimer's disease Mother   . Heart failure Father 14  . Hypertension Father   . Hyperlipidemia Father   . Cancer Father     lung- took half of a young- smoker  . Heart disease Father     MI at 35  . Early death  Neg Hx   . Kidney disease Neg Hx   . Stroke Neg Hx   . Leukemia Brother   . Drug abuse Daughter     heroine    Social History   Social History  . Marital Status: Married    Spouse Name: N/A  . Number of Children: N/A  . Years of Education: N/A   Occupational History  . Not on file.   Social History Main Topics  . Smoking status: Former Smoker -- 1.00 packs/day for 40 years    Types: Cigarettes    Quit date: 02/01/2011  . Smokeless tobacco: Never Used  . Alcohol Use: 14.4 oz/week    24 Glasses of wine per week     Comment: drinks 2 bottles of wine per week  . Drug Use: No  . Sexual Activity: Yes    Birth Control/ Protection: Coitus interruptus   Other  Topics Concern  . 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   He has been married four times previously, the first  For 66yrs and has two children by the marriage which are grown, the second time for 11 yrs and has 2 by that marriage.   Wife has custody in [place].   The third marriage was 4 1/2 yrs and most recent marriage was the past eight weeks and he is currently estranged from that person.    Outpatient Prescriptions Prior to Visit  Medication Sig Dispense Refill  . atorvastatin (LIPITOR) 20 MG tablet TAKE 1 TABLET (20 MG TOTAL) BY MOUTH DAILY. 90 tablet 1  . busPIRone (BUSPAR) 10 MG tablet TAKE 1 TABLET BY MOUTH TWICE A DAY 180 tablet 1  . carvedilol (COREG) 12.5 MG tablet TAKE 1 TABLET BY MOUTH TWICE DAILY WITH A MEAL 60 tablet 8  . clonazePAM (KLONOPIN) 0.5 MG tablet TAKE 1 TABLET THREE TIMES A DAY 90 tablet 0  . furosemide (LASIX) 40 MG tablet TAKE 1 TABLET BY MOUTH DAILY. IF WEIGHT INCREASED > 3LBS OVERNIGHT INCREASE BY ONE TABLET 45 tablet 11  . glipiZIDE (GLUCOTROL) 5 MG tablet TAKE 1/2 TABLET (2.5 MG TOTAL) BY MOUTH ONCE A DAY BEFORE BREAKFAST 15 tablet 12  . irbesartan (AVAPRO) 150 MG tablet Take 1 tablet (150 mg total) by mouth daily. 30 tablet 11  . metFORMIN (GLUCOPHAGE) 500 MG tablet Take 1 tablet (500 mg total) by mouth 2 (two) times daily with a meal. 60 tablet 6  . OVER THE COUNTER MEDICATION Place 1 drop into both eyes as needed (for dry eyes). "OTC Good Sense Eye Drops"    . rivaroxaban (XARELTO) 20 MG TABS tablet Take 1 tablet (20 mg total) by mouth daily with supper. 30 tablet 4   No facility-administered medications prior to visit.    No Known Allergies  Review of Systems  Constitutional: Negative for fever and malaise/fatigue.  HENT: Negative for congestion.   Eyes: Negative for discharge.  Respiratory: Positive for  shortness of breath.   Cardiovascular: Negative for chest pain, palpitations and leg swelling.  Gastrointestinal: Negative for nausea and abdominal pain.  Genitourinary: Negative for dysuria.  Musculoskeletal: Negative for falls.  Skin: Negative for rash.  Neurological: Negative for loss of consciousness and headaches.  Endo/Heme/Allergies: Negative for environmental allergies.  Psychiatric/Behavioral: Negative for depression. The patient is not nervous/anxious.        Objective:  Physical Exam  Constitutional: He is oriented to person, place, and time. He appears well-developed and well-nourished. No distress.  HENT:  Head: Normocephalic and atraumatic.  Eyes: Conjunctivae are normal.  Neck: Neck supple. No thyromegaly present.  Cardiovascular: Normal rate, regular rhythm and normal heart sounds.   No murmur heard. Pulmonary/Chest: Effort normal and breath sounds normal. No respiratory distress. He has no wheezes.  Abdominal: Soft. Bowel sounds are normal. He exhibits no mass. There is no tenderness.  Musculoskeletal: He exhibits no edema.  Lymphadenopathy:    He has no cervical adenopathy.  Neurological: He is alert and oriented to person, place, and time.  Skin: Skin is warm and dry.  Psychiatric: He has a normal mood and affect. His behavior is normal.    BP 142/80 mmHg  Pulse 82  Temp(Src) 98.5 F (36.9 C) (Oral)  Ht  (1.88 m)  Wt 220 lb (99.791 kg)  BMI 28.23 kg/m2  SpO2 97% Wt Readings from Last 3 Encounters:  12/05/14 220 lb (99.791 kg)  11/24/14 216 lb (97.977 kg)  04/17/14 208 lb 6.4 oz (94.53 kg)     Lab Results  Component Value Date   WBC 9.4 12/05/2014   HGB 14.9 12/05/2014   HCT 45.0 12/05/2014   PLT 169.0 12/05/2014   GLUCOSE 152* 12/05/2014   CHOL 115 12/05/2014   TRIG 252.0* 12/05/2014   HDL 30.00* 12/05/2014   LDLDIRECT 62.0 12/05/2014   LDLCALC 59 02/14/2014   ALT 45 12/05/2014   AST 62* 12/05/2014   NA 139 12/05/2014   K 3.7  12/05/2014   CL 103 12/05/2014   CREATININE 1.09 12/05/2014   BUN 9 12/05/2014   CO2 28 12/05/2014   TSH 0.86 12/05/2014   PSA 1.86 04/16/2012   INR 2.88* 02/14/2014   HGBA1C 5.8 12/05/2014   MICROALBUR 3.3* 12/05/2014    Lab Results  Component Value Date   TSH 0.86 12/05/2014   Lab Results  Component Value Date   WBC 9.4 12/05/2014   HGB 14.9 12/05/2014   HCT 45.0 12/05/2014   MCV 94.3 12/05/2014   PLT 169.0 12/05/2014   Lab Results  Component Value Date   NA 139 12/05/2014   K 3.7 12/05/2014   CO2 28 12/05/2014   GLUCOSE 152* 12/05/2014   BUN 9 12/05/2014   CREATININE 1.09 12/05/2014   BILITOT 0.9 12/05/2014   ALKPHOS 73 12/05/2014   AST 62* 12/05/2014   ALT 45 12/05/2014   PROT 7.2 12/05/2014   ALBUMIN 4.0 12/05/2014   CALCIUM 9.4 12/05/2014   ANIONGAP 9 02/14/2014   GFR 71.81 12/05/2014   Lab Results  Component Value Date   CHOL 115 12/05/2014   Lab Results  Component Value Date   HDL 30.00* 12/05/2014   Lab Results  Component Value Date   LDLCALC 59 02/14/2014   Lab Results  Component Value Date   TRIG 252.0* 12/05/2014   Lab Results  Component Value Date   CHOLHDL 4 12/05/2014   Lab Results  Component Value Date   HGBA1C 5.8 12/05/2014       Assessment & Plan:   Problem List Items Addressed This Visit    Abnormal liver function   Relevant Orders   TSH   CBC   Comprehensive metabolic panel   Lipid panel   Hemoglobin A1c   Atrial flutter (HCC)   Relevant Orders   TSH   CBC   Comprehensive metabolic panel   Lipid panel   Hemoglobin A1c  Diabetes mellitus type 2 in obese (HCC)    hgba1c acceptable, minimize simple carbs. Increase exercise as tolerated. Continue current meds      Relevant Orders   Hemoglobin A1c (Completed)   Comprehensive metabolic panel (Completed)   Microalbumin / creatinine urine ratio (Completed)   TSH   CBC   Comprehensive metabolic panel   Lipid panel   Hemoglobin A1c   Essential hypertension      Well controlled, no changes to meds. Encouraged heart healthy diet such as the DASH diet and exercise as tolerated.       Relevant Orders   TSH (Completed)   CBC (Completed)   TSH   CBC   Comprehensive metabolic panel   Lipid panel   Hemoglobin A1c   Hyperlipidemia - Primary    Tolerating statin, encouraged heart healthy diet, avoid trans fats, minimize simple carbs and saturated fats. Increase exercise as tolerated      Relevant Orders   Lipid panel (Completed)   Comprehensive metabolic panel (Completed)   TSH   CBC   Comprehensive metabolic panel   Lipid panel   Hemoglobin A1c   Medicare welcome exam    Patient denies any difficulties at home. No trouble with ADLs, depression or falls. See EMR for functional status screen and depression screen. No recent changes to vision or hearing. Is UTD with immunizations. Is UTD with screening. Discussed Advanced Directives. Encouraged heart healthy diet, exercise as tolerated and adequate sleep. See patient's problem list for health risk factors to monitor. See AVS for preventative healthcare recommendation schedule.labs ordered.   Immunization Status: reviewd  Prompted for insurance verification: Yes Flu vaccine--Refused Tdap--Refuses PNA--Ref Shingles--  A/P:  Changes to FH, PSH or Personal Hx: CCS: PSA: Bone Density:  Care Teams Updated: ED/Hospital/Urgent Care Visits: Prompted for: Updated insurance, contact information, forms: Remind to bring: DPR information, advance directives:       Paroxysmal atrial fibrillation (HCC)    Tolerating Xarelto. Rate controlled      Preventative health care    Other Visit Diagnoses    SOB (shortness of breath)        Relevant Orders    TSH    CBC    Comprehensive metabolic panel    Lipid panel    Hemoglobin A1c    Ambulatory referral to Pulmonology    H/O tobacco use, presenting hazards to health        Relevant Orders    TSH    CBC    Comprehensive metabolic panel     Lipid panel    Hemoglobin A1c    Ambulatory referral to Pulmonology       I am having Mr. Dollard start on albuterol. I am also having him maintain his OVER THE COUNTER MEDICATION, irbesartan, metFORMIN, furosemide, carvedilol, glipiZIDE, rivaroxaban, atorvastatin, clonazePAM, and busPIRone.  Meds ordered this encounter  Medications  . albuterol (PROVENTIL HFA;VENTOLIN HFA) 108 (90 BASE) MCG/ACT inhaler    Sig: Inhale 2 puffs into the lungs every 6 (six) hours as needed for wheezing or shortness of breath.    Dispense:  1 Inhaler    Refill:  2     Danise Edge, MD

## 2014-12-10 NOTE — Assessment & Plan Note (Signed)
Well controlled, no changes to meds. Encouraged heart healthy diet such as the DASH diet and exercise as tolerated.  °

## 2014-12-12 DIAGNOSIS — Z7984 Long term (current) use of oral hypoglycemic drugs: Secondary | ICD-10-CM | POA: Diagnosis not present

## 2014-12-12 DIAGNOSIS — H02833 Dermatochalasis of right eye, unspecified eyelid: Secondary | ICD-10-CM | POA: Diagnosis not present

## 2014-12-12 DIAGNOSIS — H2513 Age-related nuclear cataract, bilateral: Secondary | ICD-10-CM | POA: Diagnosis not present

## 2014-12-12 DIAGNOSIS — H11153 Pinguecula, bilateral: Secondary | ICD-10-CM | POA: Diagnosis not present

## 2014-12-12 LAB — HM DIABETES EYE EXAM

## 2014-12-15 ENCOUNTER — Encounter: Payer: Self-pay | Admitting: Family Medicine

## 2014-12-18 ENCOUNTER — Encounter: Payer: Self-pay | Admitting: Internal Medicine

## 2014-12-19 NOTE — Progress Notes (Signed)
AWV documentation reviewed  

## 2014-12-19 NOTE — Progress Notes (Signed)
Patient has been seen in follow up

## 2014-12-29 ENCOUNTER — Other Ambulatory Visit: Payer: Self-pay | Admitting: Internal Medicine

## 2015-01-04 ENCOUNTER — Telehealth: Payer: Self-pay | Admitting: Family Medicine

## 2015-01-04 ENCOUNTER — Telehealth: Payer: Self-pay | Admitting: *Deleted

## 2015-01-04 NOTE — Telephone Encounter (Signed)
Caller name: Terryl  Relationship to patient: Self  Can be reached: [phone]  Reason for call: pt would like to have CMA give him a call back directly. I asked why? Pt says that he would rather speak with CMA.

## 2015-01-04 NOTE — Telephone Encounter (Signed)
samples placed up front, sending to LR  for pt asst., LVM for pt (xarelto 20 mg)

## 2015-01-04 NOTE — Telephone Encounter (Signed)
Patient received xarelto samples from Cardiology today 01/04/2015.

## 2015-01-04 NOTE — Telephone Encounter (Signed)
Called the patient left a detailed message that we are aware he received Xarelto Samples at his cardiology office today and to call back if in need of anything else.

## 2015-01-04 NOTE — Telephone Encounter (Signed)
Requesting samples of xarelto 20 mg. Advise if ok, thanks

## 2015-01-16 ENCOUNTER — Encounter: Payer: Self-pay | Admitting: Pulmonary Disease

## 2015-01-16 ENCOUNTER — Ambulatory Visit (INDEPENDENT_AMBULATORY_CARE_PROVIDER_SITE_OTHER)
Admission: RE | Admit: 2015-01-16 | Discharge: 2015-01-16 | Disposition: A | Discharge status: Home / Self Care | Payer: Commercial Managed Care - HMO | Source: Ambulatory Visit | Attending: Pulmonary Disease | Admitting: Pulmonary Disease

## 2015-01-16 ENCOUNTER — Ambulatory Visit (INDEPENDENT_AMBULATORY_CARE_PROVIDER_SITE_OTHER): Payer: Commercial Managed Care - HMO | Admitting: Pulmonary Disease

## 2015-01-16 VITALS — BP 138/98 | HR 74 | Ht 74.0 in | Wt 218.8 lb

## 2015-01-16 DIAGNOSIS — R0689 Other abnormalities of breathing: Secondary | ICD-10-CM

## 2015-01-16 DIAGNOSIS — R0600 Dyspnea, unspecified: Secondary | ICD-10-CM

## 2015-01-16 DIAGNOSIS — R0602 Shortness of breath: Secondary | ICD-10-CM | POA: Diagnosis not present

## 2015-01-16 NOTE — Patient Instructions (Signed)
We'll start you on Spiriva We'll schedule you for pulmonary function tests, chest x-ray Return to clinic in 3 months to assess response to therapy.

## 2015-01-16 NOTE — Progress Notes (Signed)
Subjective:    Patient ID: Larry Rangel, male    DOB: February 13, 1950, 65 y.o.   MRN: [id number]  HPI Consult for evaluation of dyspnea.  Larry Rangel is a 65 year old with extensive medical history including CAD, chronic systolic heart failure (LVEF 15% in 2015), CABG 5, AICD. Complains of dyspnea and exertion for the past 1 month. 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 He has increasing dyspnea on exertion. He can't climb more than a flight of stairs without getting short of breath. He denies any cough, sputum production, wheezing. He was on inhaler many years in the past but does not recall its name or if it was effective. He used to smoke a pack a day for 40 years. He quit in 2013.   Past Medical History  Diagnosis Date  . Anxiety   . Hypertension   . Low back pain   . Tobacco user   . CAD (coronary artery disease)     a. s/p CABG 2009. b. Cath 07/2013: 3/5 patent grafts (appear to have chronic occ grafts), mod diag/L PL branch, did not appear flow limiting, elevated filling pressures.  . Hyperlipidemia   . S/P colonoscopy   . Myocardial infarction (HCC)   . Arthritis   . Paroxysmal atrial flutter (HCC) 07-2012    a. s/p ablation by Dr Graciela Husbands 08-06-2012. b. Recurrence 07/2013.   Marland Kitchen Chicken pox as a child  . Measles as a child  . Mumps as a child  . Abnormal liver function 08/23/2010  . Chronic combined systolic and diastolic CHF (congestive heart failure) (HCC)     a. Echo 6/14: Mild LVH, EF 30-35%, diffuse HK, MAC, mild BAE. b. Drop in EF to 20% by echo 07/2013.  . Diabetes mellitus (HCC)   . PAF (paroxysmal atrial fibrillation) (HCC)   . LBBB (left bundle branch block)   . Preventative health care 12/10/2014     Current outpatient prescriptions:  .  atorvastatin (LIPITOR) 20 MG tablet, TAKE 1 TABLET (20 MG TOTAL) BY MOUTH DAILY., Disp: 90 tablet, Rfl: 1 .  busPIRone (BUSPAR) 10 MG tablet, TAKE 1 TABLET BY MOUTH TWICE A DAY, Disp: 180  tablet, Rfl: 1 .  carvedilol (COREG) 12.5 MG tablet, TAKE 1 TABLET BY MOUTH TWICE DAILY WITH A MEAL, Disp: 60 tablet, Rfl: 8 .  clonazePAM (KLONOPIN) 0.5 MG tablet, TAKE 1 TABLET THREE TIMES A DAY, Disp: 90 tablet, Rfl: 0 .  furosemide (LASIX) 40 MG tablet, TAKE 1 TABLET BY MOUTH DAILY. IF WEIGHT INCREASED > 3LBS OVERNIGHT INCREASE BY ONE TABLET, Disp: 45 tablet, Rfl: 3 .  glipiZIDE (GLUCOTROL) 5 MG tablet, TAKE 1/2 TABLET (2.5 MG TOTAL) BY MOUTH ONCE A DAY BEFORE BREAKFAST, Disp: 15 tablet, Rfl: 12 .  irbesartan (AVAPRO) 150 MG tablet, Take 1 tablet (150 mg total) by mouth daily., Disp: 30 tablet, Rfl: 11 .  metFORMIN (GLUCOPHAGE) 500 MG tablet, Take 1 tablet (500 mg total) by mouth 2 (two) times daily with a meal., Disp: 60 tablet, Rfl: 6 .  OVER THE COUNTER MEDICATION, Place 1 drop into both eyes as needed (for dry eyes). "OTC Good Sense Eye Drops", Disp: , Rfl:  .  rivaroxaban (XARELTO) 20 MG TABS tablet, Take 1 tablet (20 mg total) by mouth daily with supper., Disp: 30 tablet, Rfl: 4 .  albuterol (PROVENTIL HFA;VENTOLIN HFA) 108 (90 BASE) MCG/ACT inhaler, Inhale 2 puffs into the lungs every 6 (six) hours as  needed for wheezing or shortness of breath. (Patient not taking: Reported on 01/16/2015), Disp: 1 Inhaler, Rfl: 2  Review of Systems Dyspnea on exertion, denies cough, sputum production, wheezing, hemoptysis. Denies any chest pain, palpitations. Denies any nausea, vomiting, diarrhea, constipation. All other review of systems are negative.    Objective:   Physical Exam Blood pressure 138/98, pulse 74, height 6\' 2"  (1.88 m), weight 218 lb 12.8 oz (99.247 kg), SpO2 97 %.  Gen: No apparent distress Neuro: No gross focal deficits. Neck: No JVD, lymphadenopathy, thyromegaly. RS: Clear, No wheeze, crackles.  CVS: S1-S2 heard, no murmurs rubs gallops. Abdomen: Soft, positive bowel sounds. Extremities: No edema.    Assessment & Plan:  Dyspnea and exertion.  This could be secondary to  COPD given his extensive smoking history. He's never had pulmonary function tests and is not on inhalers. He also has extensive cardiac history with EF 15%, systolic heart failure. Although he does not appear volume overloaded today his heart disease may be contributing to her symptoms as well.  I will evaluate by getting pulmonary function test, chest x-ray and start him on Spiriva. He'll return to clinic in 3 months to assess response to therapy.  Plan: - PFTs, CXR - Start Spiriva.  Return to clinic in 3 months.  Chilton Greathouse MD [facility] Pager [phone] If no answer or after 3pm call: [phone] 01/16/2015, 3:29 PM

## 2015-01-18 ENCOUNTER — Ambulatory Visit (INDEPENDENT_AMBULATORY_CARE_PROVIDER_SITE_OTHER): Payer: Commercial Managed Care - HMO | Admitting: *Deleted

## 2015-01-18 DIAGNOSIS — I429 Cardiomyopathy, unspecified: Secondary | ICD-10-CM

## 2015-01-19 NOTE — Progress Notes (Signed)
Remote ICD transmission.   

## 2015-01-25 ENCOUNTER — Telehealth: Payer: Self-pay | Admitting: Internal Medicine

## 2015-01-25 ENCOUNTER — Telehealth: Payer: Self-pay | Admitting: Pulmonary Disease

## 2015-01-25 MED ORDER — TIOTROPIUM BROMIDE MONOHYDRATE 18 MCG IN CAPS: 18.0000 ug | ORAL_CAPSULE | Freq: Every day | RESPIRATORY_TRACT | Status: DC

## 2015-01-25 NOTE — Telephone Encounter (Signed)
Follow up      Patient calling the office for samples of medication:   1.  What medication and dosage are you requesting samples for? xarelto 20mg   2.  Are you currently out of this medication? yes

## 2015-01-25 NOTE — Telephone Encounter (Signed)
Pt called back and I informed him that we needed him to bring the application for assistance to get Xarelto 20 mg tablet and that I was leaving him 2 bottles of Xarelto 20 mg tablets at the front desk and pt is aware that he needs to bring the application in asap. Pt verbalized understanding.

## 2015-01-25 NOTE — Telephone Encounter (Signed)
Spiriva handihaler. 1puff daily

## 2015-01-25 NOTE — Telephone Encounter (Signed)
Pt was advised that OV to start Spiriva. Which dose? Assessment & Plan:  Dyspnea and exertion.  This could be secondary to COPD given his extensive smoking history. He's never had pulmonary function tests and is not on inhalers. He also has extensive cardiac history with EF 15%, systolic heart failure. Although he does not appear volume overloaded today his heart disease may be contributing to her symptoms as well.  I will evaluate by getting pulmonary function test, chest x-ray and start him on Spiriva. He'll return to clinic in 3 months to assess response to therapy.  Plan: - PFTs, CXR - Start Spiriva.  Return to clinic in 3 months.       Please advise Dr Isaiah Serge. Thanks.

## 2015-01-25 NOTE — Telephone Encounter (Signed)
Called pt and left message informing him that we provided an application for assistance with his medication Xarelto 20 mg tablet in 10/2014 and he has not returned that application and has gotten samples in 12/2014. I explained to the pt that the samples are limited and are pts that are just starting this medication. I asked the pt to return our call to get this matter taken care of.

## 2015-01-25 NOTE — Telephone Encounter (Signed)
New message     Calling to see if Dr Graciela Husbands will change his xarelto to something he can afford.  He is out of xarelto---need to get samples until Dr Graciela Husbands lets him know if he can switch to another blood thinner.  Please call

## 2015-01-25 NOTE — Telephone Encounter (Signed)
LM that rx for Spiriva was sent to pharmacy.

## 2015-01-26 ENCOUNTER — Other Ambulatory Visit: Payer: Self-pay | Admitting: Family Medicine

## 2015-01-26 NOTE — Telephone Encounter (Signed)
Requesting:  clonazepam Contract  02/23/2014 UDS  Low risk Last OV   12/05/2014 Last Refill   #90 with 0 refills on 11/27/2014  Please Advise

## 2015-01-26 NOTE — Telephone Encounter (Signed)
Faxed hardcopy for Clonazepam to

## 2015-01-26 NOTE — Telephone Encounter (Signed)
[facility] [address] [place] 

## 2015-02-06 LAB — CUP PACEART REMOTE DEVICE CHECK
Battery Remaining Percentage: 86 %
Date Time Interrogation Session: 20170105122400
MDC IDC LEAD IMPLANT DT: 20151029
MDC IDC LEAD LOCATION: 753862
MDC IDC LEAD MODEL: 3401
MDC IDC PG SERIAL: 108164

## 2015-02-07 ENCOUNTER — Other Ambulatory Visit: Payer: Self-pay | Admitting: Physician Assistant

## 2015-02-09 ENCOUNTER — Encounter: Payer: Self-pay | Admitting: Cardiology

## 2015-02-11 ENCOUNTER — Other Ambulatory Visit: Payer: Self-pay | Admitting: Physician Assistant

## 2015-02-12 ENCOUNTER — Telehealth: Payer: Self-pay | Admitting: Internal Medicine

## 2015-02-12 ENCOUNTER — Telehealth: Payer: Self-pay

## 2015-02-12 NOTE — Telephone Encounter (Signed)
Patient calling the office for samples of medication:   1.  What medication and dosage are you requesting samples for? Xarelto  2.  Are you currently out of this medication? No,have not had any in 3 days

## 2015-02-12 NOTE — Telephone Encounter (Signed)
New Message  Patient calling the office for samples of medication:   1.  What medication and dosage are you requesting samples for? Xarelto   2.  Are you currently out of this medication? Yes and has been for 2 days   Request a call back to discuss an alternative as well. Because the medication is too expensive.

## 2015-02-12 NOTE — Telephone Encounter (Signed)
Pt is waiting to see what medicine he can take in the place of Xarelto?

## 2015-02-12 NOTE — Telephone Encounter (Signed)
Patient Assistance Forms for Xarelto faxed to [employer].

## 2015-02-12 NOTE — Telephone Encounter (Signed)
Patient picked samples up.

## 2015-02-12 NOTE — Telephone Encounter (Signed)
Tried to reach patient to let him know that I would place xarelto samples at the front desk.

## 2015-02-13 NOTE — Telephone Encounter (Signed)
I called and spoke with the patient's wife. I advised her that the alternatives to Xarelto 20 mg daily are:  Eliquis 5 mg BID/ Pradaxa 150 mg BID/  Warfarin as directed  I have advised they review the cost of each with the pharmacy, but have advised that warfarin requires frequent blood checks/ diet control. They have also applied for patient assistance for Xarelto. I will forward a message to Odette Horns, LPN to see if she has heard anything back on this patient as they mailed the form about 2 weeks ago.

## 2015-02-14 ENCOUNTER — Telehealth: Payer: Self-pay | Admitting: Family Medicine

## 2015-02-14 NOTE — Telephone Encounter (Signed)
Pt declined at OV 12/05/14

## 2015-02-20 NOTE — Telephone Encounter (Signed)
Left message for patient to call back. His application was faxed 02/12/2015

## 2015-02-22 ENCOUNTER — Telehealth: Payer: Self-pay | Admitting: Internal Medicine

## 2015-02-22 NOTE — Telephone Encounter (Signed)
Follow Up:  Pt is returning your call from 2/7 in regards to prior authorization for Xarelto.

## 2015-02-23 NOTE — Telephone Encounter (Signed)
Spoke with patient about Assistance with Xarelto. Informed him we have faxed his application on 02/12/2015. Neither he nor I have heard anything yet. Will try to provide him with a few samples, if possible.

## 2015-04-04 ENCOUNTER — Other Ambulatory Visit: Payer: Self-pay | Admitting: Family Medicine

## 2015-04-05 NOTE — Telephone Encounter (Signed)
Faxed hardcopy for clonazepam to [facility] [address].

## 2015-04-15 ENCOUNTER — Other Ambulatory Visit: Payer: Self-pay | Admitting: Family Medicine

## 2015-04-16 ENCOUNTER — Ambulatory Visit: Payer: Self-pay | Admitting: Pulmonary Disease

## 2015-04-17 ENCOUNTER — Ambulatory Visit (INDEPENDENT_AMBULATORY_CARE_PROVIDER_SITE_OTHER): Payer: Commercial Managed Care - HMO | Admitting: Pulmonary Disease

## 2015-04-17 ENCOUNTER — Encounter: Payer: Self-pay | Admitting: Pulmonary Disease

## 2015-04-17 VITALS — BP 142/92 | HR 71 | Ht 74.0 in | Wt 215.0 lb

## 2015-04-17 DIAGNOSIS — R0689 Other abnormalities of breathing: Secondary | ICD-10-CM | POA: Diagnosis not present

## 2015-04-17 DIAGNOSIS — J439 Emphysema, unspecified: Secondary | ICD-10-CM | POA: Diagnosis not present

## 2015-04-17 DIAGNOSIS — R0600 Dyspnea, unspecified: Secondary | ICD-10-CM

## 2015-04-17 LAB — PULMONARY FUNCTION TEST
DL/VA % pred: 91 %
DL/VA: 4.37 ml/min/mmHg/L
DLCO COR % PRED: 69 %
DLCO COR: 25.2 ml/min/mmHg
DLCO UNC: 25.68 ml/min/mmHg
DLCO unc % pred: 70 %
FEF 25-75 POST: 2.01 L/s
FEF 25-75 PRE: 1.43 L/s
FEF2575-%Change-Post: 40 %
FEF2575-%PRED-PRE: 49 %
FEF2575-%Pred-Post: 69 %
FEV1-%Change-Post: 11 %
FEV1-%PRED-POST: 64 %
FEV1-%Pred-Pre: 58 %
FEV1-POST: 2.44 L
FEV1-Pre: 2.2 L
FEV1FVC-%Change-Post: 4 %
FEV1FVC-%PRED-PRE: 93 %
FEV6-%Change-Post: 6 %
FEV6-%PRED-POST: 70 %
FEV6-%Pred-Pre: 65 %
FEV6-POST: 3.36 L
FEV6-Pre: 3.16 L
FEV6FVC-%CHANGE-POST: 0 %
FEV6FVC-%PRED-POST: 104 %
FEV6FVC-%Pred-Pre: 104 %
FVC-%Change-Post: 6 %
FVC-%Pred-Post: 67 %
FVC-%Pred-Pre: 62 %
FVC-PRE: 3.18 L
FVC-Post: 3.39 L
PRE FEV1/FVC RATIO: 69 %
Post FEV1/FVC ratio: 72 %
Post FEV6/FVC ratio: 99 %
Pre FEV6/FVC Ratio: 99 %
RV % pred: 105 %
RV: 2.69 L
TLC % PRED: 77 %
TLC: 5.94 L

## 2015-04-17 NOTE — Patient Instructions (Addendum)
Continue using the spiriva.  Return to clinic in 6 months.

## 2015-04-17 NOTE — Progress Notes (Signed)
Subjective:    Patient ID: Larry Rangel, male    DOB: 17-Dec-1950, 65 y.o.   MRN: [id number]  HPI Follow up for COPD  Larry Rangel is a 65 year old with extensive medical history including CAD, chronic systolic heart failure (LVEF 15% in 2015), CABG 5, AICD. Complains of dyspnea and exertion. 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 He has increasing dyspnea on exertion. He can't climb more than a flight of stairs without getting short of breath. He denies any cough, sputum production, wheezing.   Started on Spiriva inhaler at last visit. He notes an marked improvement in symptoms. He stopped taking it as he is feeling better.  DATA: CXR 01/16/15 No acute cardiopulmonary process.  PFTs 04/17/15 FVC 3.18 (62%) FEV1 2.2 (58%) F/F 69 TLC 77% DLCO 70%. Moderate obstructive defect mild reduction in lung volumes and diffusion. Airtrapping noted.  Social History: He used to smoke a pack a day for 40 years. He quit in 2013.   Family History: Father- lung cancer, heart disease, hyperlipidemia, hypertension. Mother- alzheimers disease Brother- leukemia  Past Medical History  Diagnosis Date  . Anxiety   . Hypertension   . Low back pain   . Tobacco user   . CAD (coronary artery disease)     a. s/p CABG 2009. b. Cath 07/2013: 3/5 patent grafts (appear to have chronic occ grafts), mod diag/L PL branch, did not appear flow limiting, elevated filling pressures.  . Hyperlipidemia   . S/P colonoscopy   . Myocardial infarction (HCC)   . Arthritis   . Paroxysmal atrial flutter (HCC) 07-2012    a. s/p ablation by Dr Graciela Husbands 08-06-2012. b. Recurrence 07/2013.   Marland Kitchen Chicken pox as a child  . Measles as a child  . Mumps as a child  . Abnormal liver function 08/23/2010  . Chronic combined systolic and diastolic CHF (congestive heart failure) (HCC)     a. Echo 6/14: Mild LVH, EF 30-35%, diffuse HK, MAC, mild BAE. b. Drop in EF to 20% by echo 07/2013.  .  Diabetes mellitus (HCC)   . PAF (paroxysmal atrial fibrillation) (HCC)   . LBBB (left bundle branch block)   . Preventative health care 12/10/2014     Current outpatient prescriptions:  .  albuterol (PROVENTIL HFA;VENTOLIN HFA) 108 (90 BASE) MCG/ACT inhaler, Inhale 2 puffs into the lungs every 6 (six) hours as needed for wheezing or shortness of breath., Disp: 1 Inhaler, Rfl: 2 .  atorvastatin (LIPITOR) 20 MG tablet, TAKE 1 TABLET (20 MG TOTAL) BY MOUTH DAILY., Disp: 90 tablet, Rfl: 1 .  busPIRone (BUSPAR) 10 MG tablet, TAKE 1 TABLET BY MOUTH TWICE A DAY, Disp: 180 tablet, Rfl: 1 .  carvedilol (COREG) 12.5 MG tablet, TAKE 1 TABLET BY MOUTH TWICE DAILY WITH A MEAL, Disp: 60 tablet, Rfl: 8 .  clonazePAM (KLONOPIN) 0.5 MG tablet, TAKE 1 TABLET BY MOUTH 3 TIMES A DAY, Disp: 90 tablet, Rfl: 1 .  furosemide (LASIX) 40 MG tablet, TAKE 1 TABLET BY MOUTH DAILY. IF WEIGHT INCREASED > 3LBS OVERNIGHT INCREASE BY ONE TABLET, Disp: 45 tablet, Rfl: 3 .  glipiZIDE (GLUCOTROL) 5 MG tablet, TAKE 1/2 TABLET (2.5 MG TOTAL) BY MOUTH ONCE A DAY BEFORE BREAKFAST, Disp: 15 tablet, Rfl: 12 .  irbesartan (AVAPRO) 150 MG tablet, TAKE 1 TABLET (150 MG TOTAL) BY MOUTH DAILY., Disp: 30 tablet, Rfl: 3 .  metFORMIN (GLUCOPHAGE) 500 MG tablet, TAKE 1 TABLET BY  MOUTH TWICE DAILY WITH A MEAL, Disp: 60 tablet, Rfl: 1 .  OVER THE COUNTER MEDICATION, Place 1 drop into both eyes as needed (for dry eyes). "OTC Good Sense Eye Drops", Disp: , Rfl:  .  rivaroxaban (XARELTO) 20 MG TABS tablet, Take 1 tablet (20 mg total) by mouth daily with supper., Disp: 30 tablet, Rfl: 4 .  tiotropium (SPIRIVA HANDIHALER) 18 MCG inhalation capsule, Place 1 capsule (18 mcg total) into inhaler and inhale daily. (Patient not taking: Reported on 04/17/2015), Disp: 30 capsule, Rfl: 3  Review of Systems Dyspnea on exertion, denies cough, sputum production, wheezing, hemoptysis. Denies any chest pain, palpitations. Denies any nausea, vomiting, diarrhea,  constipation. All other review of systems are negative.    Objective:   Physical Exam Blood pressure 142/92, pulse 71, height  (1.88 m), weight 215 lb (97.523 kg), SpO2 98 %. Gen: No apparent distress Neuro: No gross focal deficits. Neck: No JVD, lymphadenopathy, thyromegaly. RS: Clear, No wheeze, crackles.  CVS: S1-S2 heard, no murmurs rubs gallops. Abdomen: Soft, positive bowel sounds. Extremities: No edema.    Assessment & Plan:  Moderate COPD, GOLD II    Although his symptoms improved on Spiriva he has stopped taking it as he feels he does not need this anymore. He will continue to monitor his symptoms and resume the inhaler in case of worsening. PFTs and chest x-ray were reviewed with him. They show moderate emphysema. I discussed further evaluation with alpha-1 antitrypsin levels and starting screening CT of the chest. He does not want to get any of these tests done. He has appointment with his primary care physician and prefers to discuss this then.  He also has extensive cardiac history with EF 15%, systolic heart failure. Although he does not appear volume overloaded today his heart disease may be contributing to her symptoms as well.  Plan: - Continue spiriva  Return to clinic in 6 months.  Chilton Greathouse MD [facility] Pager [phone] If no answer or after 3pm call: [phone] 04/17/2015, 11:22 AM

## 2015-04-17 NOTE — Progress Notes (Signed)
PFT done today. 

## 2015-05-20 ENCOUNTER — Other Ambulatory Visit: Payer: Self-pay | Admitting: Family Medicine

## 2015-06-05 ENCOUNTER — Other Ambulatory Visit: Payer: Self-pay | Admitting: Internal Medicine

## 2015-06-06 ENCOUNTER — Other Ambulatory Visit: Payer: Self-pay | Admitting: Internal Medicine

## 2015-06-08 ENCOUNTER — Encounter: Payer: Self-pay | Admitting: Family Medicine

## 2015-06-08 ENCOUNTER — Other Ambulatory Visit: Payer: Self-pay | Admitting: Internal Medicine

## 2015-06-08 ENCOUNTER — Ambulatory Visit (INDEPENDENT_AMBULATORY_CARE_PROVIDER_SITE_OTHER): Payer: Commercial Managed Care - HMO | Admitting: Family Medicine

## 2015-06-08 VITALS — BP 142/86 | HR 63 | Temp 98.2°F | Ht 74.0 in | Wt 209.1 lb

## 2015-06-08 DIAGNOSIS — E785 Hyperlipidemia, unspecified: Secondary | ICD-10-CM | POA: Diagnosis not present

## 2015-06-08 DIAGNOSIS — E119 Type 2 diabetes mellitus without complications: Secondary | ICD-10-CM

## 2015-06-08 DIAGNOSIS — E1169 Type 2 diabetes mellitus with other specified complication: Secondary | ICD-10-CM

## 2015-06-08 DIAGNOSIS — E669 Obesity, unspecified: Secondary | ICD-10-CM

## 2015-06-08 DIAGNOSIS — I10 Essential (primary) hypertension: Secondary | ICD-10-CM | POA: Diagnosis not present

## 2015-06-08 DIAGNOSIS — I2510 Atherosclerotic heart disease of native coronary artery without angina pectoris: Secondary | ICD-10-CM | POA: Diagnosis not present

## 2015-06-08 NOTE — Patient Instructions (Signed)
Hypertension Hypertension, commonly called high blood pressure, is when the force of blood pumping through your arteries is too strong. Your arteries are the blood vessels that carry blood from your heart throughout your body. A blood pressure reading consists of a higher number over a lower number, such as 110/72. The higher number (systolic) is the pressure inside your arteries when your heart pumps. The lower number (diastolic) is the pressure inside your arteries when your heart relaxes. Ideally you want your blood pressure below 120/80. Hypertension forces your heart to work harder to pump blood. Your arteries may become narrow or stiff. Having untreated or uncontrolled hypertension can cause heart attack, stroke, kidney disease, and other problems. RISK FACTORS Some risk factors for high blood pressure are controllable. Others are not.  Risk factors you cannot control include:   Race. You may be at higher risk if you are African American.  Age. Risk increases with age.  Gender. Men are at higher risk than women before age 45 years. After age 65, women are at higher risk than men. Risk factors you can control include:  Not getting enough exercise or physical activity.  Being overweight.  Getting too much fat, sugar, calories, or salt in your diet.  Drinking too much alcohol. SIGNS AND SYMPTOMS Hypertension does not usually cause signs or symptoms. Extremely high blood pressure (hypertensive crisis) may cause headache, anxiety, shortness of breath, and nosebleed. DIAGNOSIS To check if you have hypertension, your health care provider will measure your blood pressure while you are seated, with your arm held at the level of your heart. It should be measured at least twice using the same arm. Certain conditions can cause a difference in blood pressure between your right and left arms. A blood pressure reading that is higher than normal on one occasion does not mean that you need treatment. If  it is not clear whether you have high blood pressure, you may be asked to return on a different day to have your blood pressure checked again. Or, you may be asked to monitor your blood pressure at home for 1 or more weeks. TREATMENT Treating high blood pressure includes making lifestyle changes and possibly taking medicine. Living a healthy lifestyle can help lower high blood pressure. You may need to change some of your habits. Lifestyle changes may include:  Following the DASH diet. This diet is high in fruits, vegetables, and whole grains. It is low in salt, red meat, and added sugars.  Keep your sodium intake below 2,300 mg per day.  Getting at least 30-45 minutes of aerobic exercise at least 4 times per week.  Losing weight if necessary.  Not smoking.  Limiting alcoholic beverages.  Learning ways to reduce stress. Your health care provider may prescribe medicine if lifestyle changes are not enough to get your blood pressure under control, and if one of the following is true:  You are 18-59 years of age and your systolic blood pressure is above 140.  You are 60 years of age or older, and your systolic blood pressure is above 150.  Your diastolic blood pressure is above 90.  You have diabetes, and your systolic blood pressure is over 140 or your diastolic blood pressure is over 90.  You have kidney disease and your blood pressure is above 140/90.  You have heart disease and your blood pressure is above 140/90. Your personal target blood pressure may vary depending on your medical conditions, your age, and other factors. HOME CARE INSTRUCTIONS    Have your blood pressure rechecked as directed by your health care provider.   Take medicines only as directed by your health care provider. Follow the directions carefully. Blood pressure medicines must be taken as prescribed. The medicine does not work as well when you skip doses. Skipping doses also puts you at risk for  problems.  Do not smoke.   Monitor your blood pressure at home as directed by your health care provider. SEEK MEDICAL CARE IF:   You think you are having a reaction to medicines taken.  You have recurrent headaches or feel dizzy.  You have swelling in your ankles.  You have trouble with your vision. SEEK IMMEDIATE MEDICAL CARE IF:  You develop a severe headache or confusion.  You have unusual weakness, numbness, or feel faint.  You have severe chest or abdominal pain.  You vomit repeatedly.  You have trouble breathing. MAKE SURE YOU:   Understand these instructions.  Will watch your condition.  Will get help right away if you are not doing well or get worse.   This information is not intended to replace advice given to you by your health care provider. Make sure you discuss any questions you have with your health care provider.   Document Released: 12/30/2004 Document Revised: 05/16/2014 Document Reviewed: 10/22/2012 Elsevier Interactive Patient Education 2016 Elsevier Inc.  

## 2015-06-08 NOTE — Progress Notes (Signed)
Pre visit review using our clinic review tool, if applicable. No additional management support is needed unless otherwise documented below in the visit note. 

## 2015-06-17 NOTE — Assessment & Plan Note (Signed)
Well controlled, no changes to meds. Encouraged heart healthy diet such as the DASH diet and exercise as tolerated.  °

## 2015-06-17 NOTE — Assessment & Plan Note (Signed)
Follows with cardiology, asymptomatic today

## 2015-06-17 NOTE — Assessment & Plan Note (Signed)
hgba1c acceptable, minimize simple carbs. Increase exercise as tolerated. Continue current meds 

## 2015-06-17 NOTE — Progress Notes (Signed)
Patient ID: Larry Rangel, male   DOB: 25-Apr-1950, 65 y.o.   MRN: [id number]   Subjective:    Patient ID: Larry Rangel, male    DOB: 1950-12-25, 65 y.o.   MRN: [id number]  Chief Complaint  Patient presents with  . Follow-up    HPI Patient is in today for follow up. Feels well today. No recent illness. Has not smoked in almost 4 years. Does still use a vaporizer at times. No hospitalization. Denies CP/palp/SOB/HA/congestion/fevers/GI or GU c/o. Taking meds as prescribed  Past Medical History  Diagnosis Date  . Anxiety   . Hypertension   . Low back pain   . Tobacco user   . CAD (coronary artery disease)     a. s/p CABG 2009. b. Cath 07/2013: 3/5 patent grafts (appear to have chronic occ grafts), mod diag/L PL branch, did not appear flow limiting, elevated filling pressures.  . Hyperlipidemia   . S/P colonoscopy   . Myocardial infarction (HCC)   . Arthritis   . Paroxysmal atrial flutter (HCC) 07-2012    a. s/p ablation by Dr Graciela Husbands 08-06-2012. b. Recurrence 07/2013.   Marland Kitchen Chicken pox as a child  . Measles as a child  . Mumps as a child  . Abnormal liver function 08/23/2010  . Chronic combined systolic and diastolic CHF (congestive heart failure) (HCC)     a. Echo 6/14: Mild LVH, EF 30-35%, diffuse HK, MAC, mild BAE. b. Drop in EF to 20% by echo 07/2013.  . Diabetes mellitus (HCC)   . PAF (paroxysmal atrial fibrillation) (HCC)   . LBBB (left bundle branch block)   . Preventative health care 12/10/2014    Past Surgical History  Procedure Laterality Date  . Shoulder surgery    . Coronary artery bypass graft  2009    x 5  . Tee without cardioversion N/A 07/15/2012    Procedure: TRANSESOPHAGEAL ECHOCARDIOGRAM (TEE);  Surgeon: Vesta Mixer, MD;  Location: [facility] ENDOSCOPY;  Service: Cardiovascular;  Laterality: N/A;  . Cardioversion N/A 07/15/2012    Procedure: CARDIOVERSION;  Surgeon: Vesta Mixer, MD;  Location: [facility] ENDOSCOPY;  Service: Cardiovascular;  Laterality: N/A;  .  Ablation of dysrhythmic focus  08/06/2012    CTI ablation by Dr Graciela Husbands  . Tonsillectomy    . Cardiac catheterization    . Atrial flutter ablation N/A 08/06/2012    Procedure: ATRIAL FLUTTER ABLATION;  Surgeon: Duke Salvia, MD;  Location: [facility] CATH LAB;  Service: Cardiovascular;  Laterality: N/A;  . Left heart catheterization with coronary angiogram N/A 08/01/2013    Procedure: LEFT HEART CATHETERIZATION WITH CORONARY ANGIOGRAM;  Surgeon: Kathleene Hazel, MD;  Location: [facility] CATH LAB;  Service: Cardiovascular;  Laterality: N/A;  . Implantable cardioverter defibrillator implant N/A 11/10/2013    Procedure: SUB Q IMPLANTABLE CARDIOVERTER DEFIBRILLATOR IMPLANT;  Surgeon: Duke Salvia, MD;  Location[facility] C[facility] LAB;  Service: Cardiovascular;  Laterality: N/A;  . Pocket revision N/A 12/16/2013    Procedure: POCKET REVISION;  Surgeon: Marinus Maw, MD;  Location: [facility] CATH LAB;  Service: Cardiovascular;  Laterality: N/A;    Family History  Problem Relation Age of Onset  . Alzheimer's disease Mother   . Heart failure Father 66  . Hypertension Father   . Hyperlipidemia Father   . Cancer Father     lung- took half of a young- smoker  . Heart disease Father     MI at 89  . Early death Neg Hx   . Kidney  disease Neg Hx   . Stroke Neg Hx   . Leukemia Brother   . Drug abuse Daughter     heroine    Social History   Social History  . Marital Status: Married    Spouse Name: N/A  . Number of Children: N/A  . Years of Education: N/A   Occupational History  . Not on file.   Social History Main Topics  . Smoking status: Former Smoker -- 1.00 packs/day for 40 years    Types: Cigarettes    Quit date: 02/01/2011  . Smokeless tobacco: Never Used  . Alcohol Use: 14.4 oz/week    24 Glasses of wine per week     Comment: drinks 2 bottles of wine per week  . Drug Use: No  . Sexual Activity: Yes    Birth Control/ Protection: Coitus interruptus   Other Topics Concern  . 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   He has been married four times previously, the first  For 34yrs and has two children by the marriage which are grown, the second time for 11 yrs and has 2 by that marriage.   Wife has custody in [place].   The third marriage was 4 1/2 yrs and most recent marriage was the past eight weeks and he is currently estranged from that person.    Outpatient Prescriptions Prior to Visit  Medication Sig Dispense Refill  . albuterol (PROVENTIL HFA;VENTOLIN HFA) 108 (90 BASE) MCG/ACT inhaler Inhale 2 puffs into the lungs every 6 (six) hours as needed for wheezing or shortness of breath. 1 Inhaler 2  . atorvastatin (LIPITOR) 20 MG tablet TAKE 1 TABLET (20 MG TOTAL) BY MOUTH DAILY. 90 tablet 1  . busPIRone (BUSPAR) 10 MG tablet TAKE 1 TABLET BY MOUTH TWICE A DAY 180 tablet 1  . carvedilol (COREG) 12.5 MG tablet TAKE 1 TABLET BY MOUTH TWICE DAILY WITH A MEAL 60 tablet 8  . clonazePAM (KLONOPIN) 0.5 MG tablet TAKE 1 TABLET BY MOUTH 3 TIMES A DAY 90 tablet 1  . furosemide (LASIX) 40 MG tablet TAKE 1 TABLET BY MOUTH DAILY. IF WEIGHT INCREASED > 3LBS OVERNIGHT INCREASE BY ONE TABLET 45 tablet 3  . glipiZIDE (GLUCOTROL) 5 MG tablet TAKE 1/2 TABLET (2.5 MG TOTAL) BY MOUTH ONCE A DAY BEFORE BREAKFAST 15 tablet 12  . irbesartan (AVAPRO) 150 MG tablet TAKE 1 TABLET (150 MG TOTAL) BY MOUTH DAILY. 30 tablet 1  . metFORMIN (GLUCOPHAGE) 500 MG tablet TAKE 1 TABLET BY MOUTH TWICE DAILY WITH A MEAL 60 tablet 0  . OVER THE COUNTER MEDICATION Place 1 drop into both eyes as needed (for dry eyes). "OTC Good Sense Eye Drops"    . rivaroxaban (XARELTO) 20 MG TABS tablet Take 1 tablet (20 mg total) by mouth daily with supper. 30 tablet 4  . tiotropium (SPIRIVA HANDIHALER) 18 MCG inhalation capsule Place 1 capsule (18 mcg total) into inhaler and inhale daily. (Patient not  taking: Reported on 04/17/2015) 30 capsule 3   No facility-administered medications prior to visit.    No Known Allergies  Review of Systems  Constitutional: Negative for fever and malaise/fatigue.  HENT: Negative for congestion.   Eyes: Negative for blurred vision.  Respiratory: Negative for shortness of breath.   Cardiovascular: Negative for chest pain, palpitations and leg swelling.  Gastrointestinal: Negative  for nausea, abdominal pain and blood in stool.  Genitourinary: Negative for dysuria and frequency.  Musculoskeletal: Negative for falls.  Skin: Negative for rash.  Neurological: Negative for dizziness, loss of consciousness and headaches.  Endo/Heme/Allergies: Negative for environmental allergies.  Psychiatric/Behavioral: Negative for depression. The patient is not nervous/anxious.        Objective:    Physical Exam  Constitutional: He is oriented to person, place, and time. He appears well-developed and well-nourished. No distress.  HENT:  Head: Normocephalic and atraumatic.  Nose: Nose normal.  Eyes: Right eye exhibits no discharge. Left eye exhibits no discharge.  Neck: Normal range of motion. Neck supple.  Cardiovascular: Normal rate and regular rhythm.   No murmur heard. Pulmonary/Chest: Effort normal and breath sounds normal.  Abdominal: Soft. Bowel sounds are normal. There is no tenderness.  Musculoskeletal: He exhibits no edema.  Neurological: He is alert and oriented to person, place, and time.  Skin: Skin is warm and dry.  Psychiatric: He has a normal mood and affect.  Nursing note and vitals reviewed.   BP 142/86 mmHg  Pulse 63  Temp(Src) 98.2 F (36.8 C) (Oral)  Ht 6\' 2"  (1.88 m)  Wt 209 lb 2 oz (94.858 kg)  BMI 26.84 kg/m2  SpO2 98% Wt Readings from Last 3 Encounters:  06/08/15 209 lb 2 oz (94.858 kg)  04/17/15 215 lb (97.523 kg)  01/16/15 218 lb 12.8 oz (99.247 kg)     Lab Results  Component Value Date   WBC 9.4 12/05/2014   HGB 14.9  12/05/2014   HCT 45.0 12/05/2014   PLT 169.0 12/05/2014   GLUCOSE 152* 12/05/2014   CHOL 115 12/05/2014   TRIG 252.0* 12/05/2014   HDL 30.00* 12/05/2014   LDLDIRECT 62.0 12/05/2014   LDLCALC 59 02/14/2014   ALT 45 12/05/2014   AST 62* 12/05/2014   NA 139 12/05/2014   K 3.7 12/05/2014   CL 103 12/05/2014   CREATININE 1.09 12/05/2014   BUN 9 12/05/2014   CO2 28 12/05/2014   TSH 0.86 12/05/2014   PSA 1.86 04/16/2012   INR 2.88* 02/14/2014   HGBA1C 5.8 12/05/2014   MICROALBUR 3.3* 12/05/2014    Lab Results  Component Value Date   TSH 0.86 12/05/2014   Lab Results  Component Value Date   WBC 9.4 12/05/2014   HGB 14.9 12/05/2014   HCT 45.0 12/05/2014   MCV 94.3 12/05/2014   PLT 169.0 12/05/2014   Lab Results  Component Value Date   NA 139 12/05/2014   K 3.7 12/05/2014   CO2 28 12/05/2014   GLUCOSE 152* 12/05/2014   BUN 9 12/05/2014   CREATININE 1.09 12/05/2014   BILITOT 0.9 12/05/2014   ALKPHOS 73 12/05/2014   AST 62* 12/05/2014   ALT 45 12/05/2014   PROT 7.2 12/05/2014   ALBUMIN 4.0 12/05/2014   CALCIUM 9.4 12/05/2014   ANIONGAP 9 02/14/2014   GFR 71.81 12/05/2014   Lab Results  Component Value Date   CHOL 115 12/05/2014   Lab Results  Component Value Date   HDL 30.00* 12/05/2014   Lab Results  Component Value Date   LDLCALC 59 02/14/2014   Lab Results  Component Value Date   TRIG 252.0* 12/05/2014   Lab Results  Component Value Date   CHOLHDL 4 12/05/2014   Lab Results  Component Value Date   HGBA1C 5.8 12/05/2014       Assessment & Plan:   Problem List Items Addressed This Visit  Hyperlipidemia - Primary    Encouraged heart healthy diet, increase exercise, avoid trans fats, consider a krill oil cap daily      Relevant Orders   Hemoglobin A1c   Lipid panel   Comprehensive metabolic panel   TSH   CBC   Microalbumin / creatinine urine ratio   Essential hypertension    Well controlled, no changes to meds. Encouraged heart  healthy diet such as the DASH diet and exercise as tolerated.       Relevant Orders   Hemoglobin A1c   Lipid panel   Comprehensive metabolic panel   TSH   CBC   Microalbumin / creatinine urine ratio   Diabetes mellitus type 2 in obese (HCC)    hgba1c acceptable, minimize simple carbs. Increase exercise as tolerated. Continue current meds      Relevant Orders   Hemoglobin A1c   Lipid panel   Comprehensive metabolic panel   TSH   CBC   Microalbumin / creatinine urine ratio   CAD (coronary artery disease)    Follows with cardiology, asymptomatic today      Relevant Orders   Hemoglobin A1c   Lipid panel   Comprehensive metabolic panel   TSH   CBC   Microalbumin / creatinine urine ratio      I have discontinued Mr. Sarra tiotropium. I am also having him maintain his OVER THE COUNTER MEDICATION, carvedilol, glipiZIDE, busPIRone, albuterol, furosemide, clonazePAM, atorvastatin, metFORMIN, and irbesartan.  No orders of the defined types were placed in this encounter.     Danise Edge, MD

## 2015-06-17 NOTE — Assessment & Plan Note (Signed)
Encouraged heart healthy diet, increase exercise, avoid trans fats, consider a krill oil cap daily 

## 2015-06-23 ENCOUNTER — Other Ambulatory Visit: Payer: Self-pay | Admitting: Internal Medicine

## 2015-06-23 ENCOUNTER — Other Ambulatory Visit: Payer: Self-pay | Admitting: Family Medicine

## 2015-06-28 ENCOUNTER — Telehealth: Payer: Self-pay | Admitting: Family Medicine

## 2015-06-28 NOTE — Telephone Encounter (Signed)
Can be reached: [phone]   Reason for call: pt called stating he received a msg to come in for labs and is inquiring why they are needed and when he needs to come in. No notes in system.

## 2015-06-28 NOTE — Telephone Encounter (Signed)
Future orders were placed at pt's last OV. Unsure why labs were not drawn during OV. However, pt has been scheduled for lab appt 07/03/15.

## 2015-07-03 ENCOUNTER — Other Ambulatory Visit (INDEPENDENT_AMBULATORY_CARE_PROVIDER_SITE_OTHER): Payer: Commercial Managed Care - HMO

## 2015-07-03 DIAGNOSIS — I2510 Atherosclerotic heart disease of native coronary artery without angina pectoris: Secondary | ICD-10-CM

## 2015-07-03 DIAGNOSIS — E785 Hyperlipidemia, unspecified: Secondary | ICD-10-CM

## 2015-07-03 DIAGNOSIS — E119 Type 2 diabetes mellitus without complications: Secondary | ICD-10-CM | POA: Diagnosis not present

## 2015-07-03 DIAGNOSIS — I10 Essential (primary) hypertension: Secondary | ICD-10-CM | POA: Diagnosis not present

## 2015-07-03 DIAGNOSIS — E1169 Type 2 diabetes mellitus with other specified complication: Secondary | ICD-10-CM

## 2015-07-03 DIAGNOSIS — E669 Obesity, unspecified: Secondary | ICD-10-CM

## 2015-07-03 LAB — LIPID PANEL
CHOLESTEROL: 120 mg/dL (ref 0–200)
HDL: 31.2 mg/dL — ABNORMAL LOW (ref >39.00)
LDL Cholesterol: 57 mg/dL (ref 0–99)
NonHDL: 89.19
TRIGLYCERIDES: 161 mg/dL — AB (ref 0.0–149.0)
Total CHOL/HDL Ratio: 4
VLDL: 32.2 mg/dL (ref 0.0–40.0)

## 2015-07-03 LAB — COMPREHENSIVE METABOLIC PANEL
ALK PHOS: 79 U/L (ref 39–117)
ALT: 29 U/L (ref 0–53)
AST: 29 U/L (ref 0–37)
Albumin: 4 g/dL (ref 3.5–5.2)
BUN: 11 mg/dL (ref 6–23)
CALCIUM: 9.7 mg/dL (ref 8.4–10.5)
CO2: 28 mEq/L (ref 19–32)
Chloride: 104 mEq/L (ref 96–112)
Creatinine, Ser: 1.02 mg/dL (ref 0.40–1.50)
GFR: 77.39 mL/min (ref >60.00)
GLUCOSE: 185 mg/dL — AB (ref 70–99)
POTASSIUM: 3.6 meq/L (ref 3.5–5.1)
Sodium: 138 mEq/L (ref 135–145)
TOTAL PROTEIN: 7.3 g/dL (ref 6.0–8.3)
Total Bilirubin: 0.9 mg/dL (ref 0.2–1.2)

## 2015-07-03 LAB — CBC
HCT: 47.2 % (ref 39.0–52.0)
Hemoglobin: 15.9 g/dL (ref 13.0–17.0)
MCHC: 33.8 g/dL (ref 30.0–36.0)
MCV: 90.3 fl (ref 78.0–100.0)
PLATELETS: 170 10*3/uL (ref 150.0–400.0)
RBC: 5.22 Mil/uL (ref 4.22–5.81)
RDW: 14.1 % (ref 11.5–15.5)
WBC: 9.3 10*3/uL (ref 4.0–10.5)

## 2015-07-03 LAB — TSH: TSH: 0.62 u[IU]/mL (ref 0.35–4.50)

## 2015-07-03 LAB — MICROALBUMIN / CREATININE URINE RATIO
CREATININE, U: 106.4 mg/dL
MICROALB/CREAT RATIO: 4.2 mg/g (ref 0.0–30.0)
Microalb, Ur: 4.5 mg/dL — ABNORMAL HIGH (ref 0.0–1.9)

## 2015-07-03 LAB — HEMOGLOBIN A1C: Hgb A1c MFr Bld: 6 % (ref 4.6–6.5)

## 2015-07-16 ENCOUNTER — Encounter: Payer: Commercial Managed Care - HMO | Admitting: Internal Medicine

## 2015-08-07 ENCOUNTER — Other Ambulatory Visit: Payer: Self-pay | Admitting: Internal Medicine

## 2015-08-08 ENCOUNTER — Other Ambulatory Visit: Payer: Self-pay | Admitting: Internal Medicine

## 2015-08-18 ENCOUNTER — Other Ambulatory Visit: Payer: Self-pay | Admitting: Internal Medicine

## 2015-08-20 ENCOUNTER — Ambulatory Visit (INDEPENDENT_AMBULATORY_CARE_PROVIDER_SITE_OTHER): Payer: Commercial Managed Care - HMO | Admitting: *Deleted

## 2015-08-20 DIAGNOSIS — I429 Cardiomyopathy, unspecified: Secondary | ICD-10-CM

## 2015-08-20 NOTE — Progress Notes (Signed)
Remote ICD transmission.   

## 2015-08-22 ENCOUNTER — Other Ambulatory Visit: Payer: Self-pay | Admitting: Family Medicine

## 2015-08-22 ENCOUNTER — Encounter: Payer: Self-pay | Admitting: Cardiology

## 2015-08-23 ENCOUNTER — Other Ambulatory Visit: Payer: Self-pay | Admitting: Family Medicine

## 2015-08-23 NOTE — Telephone Encounter (Signed)
Faxed hardcopy for clonazepam to [facility] [address] [place].

## 2015-08-28 LAB — CUP PACEART REMOTE DEVICE CHECK
Battery Remaining Percentage: 79 %
Date Time Interrogation Session: 20170807124400
Implantable Lead Implant Date: 20151029
Implantable Lead Location: 753862
Implantable Lead Model: 3401
Pulse Gen Serial Number: 108164

## 2015-09-04 ENCOUNTER — Other Ambulatory Visit: Payer: Self-pay | Admitting: Family Medicine

## 2015-09-04 ENCOUNTER — Other Ambulatory Visit: Payer: Self-pay | Admitting: Internal Medicine

## 2015-09-07 ENCOUNTER — Encounter: Payer: Self-pay | Admitting: Cardiology

## 2015-09-10 ENCOUNTER — Telehealth: Payer: Self-pay | Admitting: Internal Medicine

## 2015-09-10 NOTE — Telephone Encounter (Signed)
Pt's wife states [employer] is asking him to work more hours and he does not feel he can work more than 6 hours per day. Pt's wife asking to pick up letter today. Pt's wife advised that letter will not be available today.  Pt's wife advised I will forward to Dr Graciela Husbands for review.

## 2015-09-10 NOTE — Telephone Encounter (Signed)
New message     Pt called stating he needs a note stating that he can't work more than 6 hours a day and no more than 26 hrs a week. He works for [employer]. Pt would like to pick up the note today at 2pm. [phone]. Please call.

## 2015-09-10 NOTE — Telephone Encounter (Signed)
I spoke with pt's wife. Pt's wife states pt has been working at [employer] in the evening. Pt's wife states pt is requesting a letter stating he is unable to work more than 6 hours per day.

## 2015-09-11 NOTE — Telephone Encounter (Signed)
Follow Up:    Pt checking to see if his letter is ready,he needs this asap please.

## 2015-09-11 NOTE — Telephone Encounter (Signed)
I called and spoke with the patient's wife (DPR) to inquire if Mr. Raczkowski was having any change in cardiac symptoms that he his requesting the note for work. Per Mrs. Gerhold, the patient works part time for [employer] (6 hours). They have been asking him to work more hours (not mandatory). He works in Sports administrator. He is being asked periodically to work an extra 2-3 hours. I advised Mrs. Pennino that I would need to review with Dr. Graciela Husbands, however, since we have not seen him in over a year, I am uncertain if Dr. Graciela Husbands will do a letter for him. She is aware I will call her back today to confirm.  Discussed with Dr. Graciela Husbands- per MD, there is no cardiac reason that he can give at this time to restrict the patient's hours of work.  He will need an office visit for re-evaluation and can discuss at that time.  I attempted to call Mrs. Donahey back- I left a message on her identified voice mail of Dr. Odessa Fleming recommendations and to call back with any questions. I also advised I will forward to Melissa to be in touch to schedule his follow up.

## 2015-09-12 ENCOUNTER — Telehealth: Payer: Self-pay | Admitting: Internal Medicine

## 2015-09-12 ENCOUNTER — Encounter: Payer: Self-pay | Admitting: Family Medicine

## 2015-09-12 NOTE — Telephone Encounter (Signed)
New message   Pt wife verbalized that she is calling to get a note for the pt job

## 2015-09-12 NOTE — Telephone Encounter (Signed)
I left a message on the patient's wife's identified voice mail again, that Dr. Graciela Husbands is unable to do a letter for the patient to restrict his hours from work as there is no cardiac reason for this. As well, we have not seen him in over a year.  I left a message for her to call back with any further questions/ concerns.

## 2015-09-14 NOTE — Telephone Encounter (Signed)
Letter printed, stamped and placed at front desk for pick up. Mychart response sent to pt.

## 2015-09-15 ENCOUNTER — Other Ambulatory Visit: Payer: Self-pay | Admitting: Family Medicine

## 2015-09-16 ENCOUNTER — Other Ambulatory Visit: Payer: Self-pay | Admitting: Family Medicine

## 2015-09-18 NOTE — Telephone Encounter (Signed)
Faxed hardcopy to clonazepam to

## 2015-09-18 NOTE — Telephone Encounter (Signed)
[facility] [address]

## 2015-09-19 ENCOUNTER — Other Ambulatory Visit: Payer: Self-pay | Admitting: Internal Medicine

## 2015-09-21 ENCOUNTER — Other Ambulatory Visit: Payer: Self-pay | Admitting: Internal Medicine

## 2015-09-22 ENCOUNTER — Other Ambulatory Visit: Payer: Self-pay | Admitting: Internal Medicine

## 2015-09-28 ENCOUNTER — Encounter: Payer: Self-pay | Admitting: Nurse Practitioner

## 2015-10-01 ENCOUNTER — Ambulatory Visit (INDEPENDENT_AMBULATORY_CARE_PROVIDER_SITE_OTHER): Payer: Commercial Managed Care - HMO | Admitting: Physician Assistant

## 2015-10-01 ENCOUNTER — Encounter: Payer: Self-pay | Admitting: Physician Assistant

## 2015-10-01 ENCOUNTER — Other Ambulatory Visit: Payer: Self-pay | Admitting: Internal Medicine

## 2015-10-01 VITALS — BP 142/92 | HR 52 | Ht 74.0 in | Wt 201.1 lb

## 2015-10-01 DIAGNOSIS — I10 Essential (primary) hypertension: Secondary | ICD-10-CM | POA: Diagnosis not present

## 2015-10-01 DIAGNOSIS — I2510 Atherosclerotic heart disease of native coronary artery without angina pectoris: Secondary | ICD-10-CM | POA: Diagnosis not present

## 2015-10-01 DIAGNOSIS — Z7901 Long term (current) use of anticoagulants: Secondary | ICD-10-CM | POA: Diagnosis not present

## 2015-10-01 DIAGNOSIS — I4892 Unspecified atrial flutter: Secondary | ICD-10-CM | POA: Diagnosis not present

## 2015-10-01 DIAGNOSIS — I480 Paroxysmal atrial fibrillation: Secondary | ICD-10-CM

## 2015-10-01 DIAGNOSIS — I255 Ischemic cardiomyopathy: Secondary | ICD-10-CM

## 2015-10-01 LAB — CBC WITH DIFFERENTIAL/PLATELET
BASOS PCT: 1 %
Basophils Absolute: 75 cells/uL (ref 0–200)
EOS ABS: 300 {cells}/uL (ref 15–500)
Eosinophils Relative: 4 %
HCT: 45.2 % (ref 38.5–50.0)
Hemoglobin: 15.5 g/dL (ref 13.2–17.1)
LYMPHS ABS: 1950 {cells}/uL (ref 850–3900)
Lymphocytes Relative: 26 %
MCH: 30.6 pg (ref 27.0–33.0)
MCHC: 34.3 g/dL (ref 32.0–36.0)
MCV: 89.3 fL (ref 80.0–100.0)
MONO ABS: 675 {cells}/uL (ref 200–950)
MONOS PCT: 9 %
MPV: 10.2 fL (ref 7.5–12.5)
NEUTROS ABS: 4500 {cells}/uL (ref 1500–7800)
Neutrophils Relative %: 60 %
PLATELETS: 159 10*3/uL (ref 140–400)
RBC: 5.06 MIL/uL (ref 4.20–5.80)
RDW: 13.8 % (ref 11.0–15.0)
WBC: 7.5 10*3/uL (ref 3.8–10.8)

## 2015-10-01 NOTE — Patient Instructions (Addendum)
Medication Instructions:  Your physician recommends that you continue on your current medications as directed. Please refer to the Current Medication list given to you today.   Labwork: Cbc Today  Testing/Procedures: Your physician has requested that you have an echocardiogram. Echocardiography is a painless test that uses sound waves to create images of your heart. It provides your doctor with information about the size and shape of your heart and how well your heart's chambers and valves are working. This procedure takes approximately one hour. There are no restrictions for this procedure.    Follow-Up: Your physician wants you to follow-up in: 6 months with Dr.Klein You will receive a reminder letter in the mail two months in advance. If you don't receive a letter, please call our office to schedule the follow-up appointment.   Any Other Special Instructions Will Be Listed Below (If Applicable).     If you need a refill on your cardiac medications before your next appointment, please call your pharmacy.

## 2015-10-01 NOTE — Progress Notes (Signed)
Cardiology Office Note    Date:  10/01/2015   ID:  Larry Rangel, DOB 1950/03/02, MRN [id number]  PCP:  Larry Edge, MD  Cardiologist:  Dr. Graciela Husbands  Chief Complaint: Yearly follow up  History of Present Illness:   Larry Rangel is a 65 y.o. male CAD with prior CABG, ICM (Ef of 15% on echo 09/2013) with S-ICD implant back in October of 2015, prior atrial flutter ablation in 2014, PAF, HLD and HTN who presented for follow up.   Admitted on 02/13/2014 with a sudden unwitnessed syncopal spell without prodrome -it was felt to be possibly orthostatic as his creatinine was elevated; is also thought possibly could be related to a VT below detection rate is device was reprogrammed. Aldactone and Avapro were down titrated. Off aldactone due to hyperkalemia.   He was doing well on cardiac stand point when last seen by Dr. Graciela Husbands 04/17/14.  Device function was normal on last check 08/20/15.   Here today for follow up. He works at [employer] 5 hours/day. He dose lifting (about 10 lb) and moving. He denies any exertional CP or dyspnea. He states that he never received appointment letter. The patient denies nausea, vomiting, fever, chest pain, palpitations, shortness of breath, orthopnea, PND, dizziness, syncope, cough, congestion, abdominal pain, hematochezia, melena, lower extremity edema. Noted scatted bruise on extremity. States his BP always runs in 140-160s. He feels dizzy when in 120-130s ranges.   Past Medical History:  Diagnosis Date  . Abnormal liver function 08/23/2010  . Anxiety   . Arthritis   . CAD (coronary artery disease)    a. s/p CABG 2009. b. Cath 07/2013: 3/5 patent grafts (appear to have chronic occ grafts), mod diag/L PL branch, did not appear flow limiting, elevated filling pressures.  . Chicken pox as a child  . Chronic combined systolic and diastolic CHF (congestive heart failure) (HCC)    a. Echo 6/14: Mild LVH, EF 30-35%, diffuse HK, MAC, mild BAE. b. Drop in EF to 20% by echo  07/2013.  . Diabetes mellitus (HCC)   . Hyperlipidemia   . Hypertension   . LBBB (left bundle branch block)   . Low back pain   . Measles as a child  . Mumps as a child  . Myocardial infarction (HCC)   . PAF (paroxysmal atrial fibrillation) (HCC)   . Paroxysmal atrial flutter (HCC) 07-2012   a. s/p ablation by Dr Graciela Husbands 08-06-2012. b. Recurrence 07/2013.   [facility] 12/10/2014  . S/P colonoscopy   . Tobacco user     Past Surgical History:  Procedure Laterality Date  . ABLATION OF DYSRHYTHMIC FOCUS  08/06/2012   CTI ablation by Dr Graciela Husbands  . ATRIAL FLUTTER ABLATION N/A 08/06/2012   Procedure: ATRIAL FLUTTER ABLATION;  Surgeon: Duke Salvia, MD;  Location: [facility] CATH LAB;  Service: Cardiovascular;  Laterality: N/A;  . CARDIAC CATHETERIZATION    . CARDIOVERSION N/A 07/15/2012   Procedure: CARDIOVERSION;  Surgeon: Vesta Mixer, MD;  Location: [facility] ENDOSCOPY;  Service: Cardiovascular;  Laterality: N/A;  . CORONARY ARTERY BYPASS GRAFT  2009   x 5  . IMPLANTABLE CARDIOVERTER DEFIBRILLATOR IMPLANT N/A 11/10/2013   Procedure: SUB Q IMPLANTABLE CARDIOVERTER DEFIBRILLATOR IMPLANT;  Surgeon: Duke Salvia, MD;  Location: [facility] CATH LAB;  Service: Cardiovascular;  Laterality: N/A;  . LEFT HEART CATHETERIZATION WITH CORONARY ANGIOGRAM N/A 08/01/2013   Procedure: LEFT HEART CATHETERIZATION WITH CORONARY ANGIOGRAM;  Surgeon: Kathleene Hazel, MD;  Location: [facility] CATH  LAB;  Service: Cardiovascular;  Laterality: N/A;  . POCKET REVISION N/A 12/16/2013   Procedure: POCKET REVISION;  Surgeon: Marinus Maw, MD;  Location: [facility] CATH LAB;  Service: Cardiovascular;  Laterality: N/A;  . SHOULDER SURGERY    . TEE WITHOUT CARDIOVERSION N/A 07/15/2012   Procedure: TRANSESOPHAGEAL ECHOCARDIOGRAM (TEE);  Surgeon: Vesta Mixer, MD;  Location: [facility] ENDOSCOPY;  Service: Cardiovascular;  Laterality: N/A;  . TONSILLECTOMY      Current Medications: Prior to Admission medications   Medication Sig Start  Date End Date Taking? Authorizing Provider  albuterol (PROVENTIL HFA;VENTOLIN HFA) 108 (90 BASE) MCG/ACT inhaler Inhale 2 puffs into the lungs every 6 (six) hours as needed for wheezing or shortness of breath. 12/05/14   Bradd Canary, MD  atorvastatin (LIPITOR) 20 MG tablet TAKE 1 TABLET (20 MG TOTAL) BY MOUTH DAILY. 04/15/15   Bradd Canary, MD  busPIRone (BUSPAR) 10 MG tablet TAKE 1 TABLET BY MOUTH TWICE A DAY 09/04/15   Bradd Canary, MD  carvedilol (COREG) 12.5 MG tablet TAKE 1 TABLET BY MOUTH TWICE DAILY WITH A MEAL 09/21/15   Duke Salvia, MD  clonazePAM (KLONOPIN) 0.5 MG tablet TAKE 1 TABLET BY MOUTH 3 TIMES A DAY 09/17/15   Bradd Canary, MD  furosemide (LASIX) 40 MG tablet TAKE 1 TABLET BY MOUTH DAILY. IF WEIGHT INCREASED > 3LBS OVERNIGHT INCREASE BY ONE TABLET 09/24/15   Duke Salvia, MD  glipiZIDE (GLUCOTROL) 5 MG tablet TAKE 1/2 TABLET (2.5 MG TOTAL) BY MOUTH ONCE A DAY BEFORE BREAKFAST 09/04/15   Bradd Canary, MD  irbesartan (AVAPRO) 150 MG tablet TAKE 1 TABLET (150 MG TOTAL) BY MOUTH DAILY. 08/07/15   Duke Salvia, MD  irbesartan (AVAPRO) 150 MG tablet TAKE 1 TABLET (150 MG TOTAL) BY MOUTH DAILY. 08/08/15   Duke Salvia, MD  metFORMIN (GLUCOPHAGE) 500 MG tablet TAKE 1 TABLET BY MOUTH TWICE DAILY WITH A MEAL 09/17/15   Bradd Canary, MD  OVER THE COUNTER MEDICATION Place 1 drop into both eyes as needed (for dry eyes). "OTC Good Sense Eye Drops"    Historical Provider, MD  rivaroxaban (XARELTO) 20 MG TABS tablet Take 1 tablet (20 mg total) by mouth daily with supper. 09/19/15   Duke Salvia, MD    Allergies:   Review of patient's allergies indicates no known allergies.   Social History   Social History  . Marital status: Married    Spouse name: N/A  . Number of children: N/A  . Years of education: N/A   Social History Main Topics  . Smoking status: Former Smoker    Packs/day: 1.00    Years: 40.00    Types: Cigarettes    Quit date: 02/01/2011  . Smokeless tobacco: Never  Used  . Alcohol use 14.4 oz/week    24 Glasses of wine per week     Comment: drinks 2 bottles of wine per week  . Drug use: No  . Sexual activity: Yes    Birth control/ protection: Coitus interruptus   Other Topics Concern  . 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   He has been married four times previously, the first  For 60yrs and has two children by the marriage which are grown, the second time for 11 yrs and has 2 by that marriage.  Wife has custody in [place].   The third marriage was 4 1/2 yrs and most recent marriage was the past eight weeks and he is currently estranged from that person.     Family History:  The patient's family history includes Alzheimer's disease in his mother; Cancer in his father; Drug abuse in his daughter; Heart disease in his father; Heart failure (age of onset: 70) in his father; Hyperlipidemia in his father; Hypertension in his father; Leukemia in his brother.   ROS:   Please see the history of present illness.    ROS All other systems reviewed and are negative.   PHYSICAL EXAM:   VS:  BP (!) 142/92   Pulse (!) 52   Ht 6\' 2"  (1.88 m)   Wt 201 lb 1.9 oz (91.2 kg)   BMI 25.82 kg/m    GEN: Well nourished, well developed, in no acute distress  HEENT: normal  Neck: no JVD, carotid bruits, or masses Cardiac: RRR; no murmurs, rubs, or gallops,no edema  Respiratory:  clear to auscultation bilaterally, normal work of breathing GI: soft, nontender, nondistended, + BS MS: no deformity or atrophy  Skin: Multiple scattered bruise Neuro:  Alert and Oriented x 3, Strength and sensation are intact Psych: euthymic mood, full affect  Wt Readings from Last 3 Encounters:  10/01/15 201 lb 1.9 oz (91.2 kg)  06/08/15 209 lb 2 oz (94.9 kg)  04/17/15 215 lb (97.5 kg)      Studies/Labs Reviewed:   EKG:  EKG is  ordered today.  The ekg ordered today demonstrates sinus rhythm with PACs. Repolarization abnormality in inferor lateral leads. ? More prominent than prior ekg of 04/2014.  Recent Labs: 07/03/2015: ALT 29; BUN 11; Creatinine, Ser 1.02; Hemoglobin 15.9; Platelets 170.0; Potassium 3.6; Sodium 138; TSH 0.62   Lipid Panel    Component Value Date/Time   CHOL 120 07/03/2015 0958   TRIG 161.0 (H) 07/03/2015 0958   HDL 31.20 (L) 07/03/2015 0958   CHOLHDL 4 07/03/2015 0958   VLDL 32.2 07/03/2015 0958   LDLCALC 57 07/03/2015 0958   LDLDIRECT 62.0 12/05/2014 1203    Additional studies/ records that were reviewed today include:   Echocardiogram: 09/2013 LV EF: 15%  ------------------------------------------------------------------- Indications:   425.4 Other primary Cardiomyopathies.  ------------------------------------------------------------------- History:  PMH: Hyperlipidemia. Atrial fibrillation. Coronary artery disease.  ------------------------------------------------------------------- Study Conclusions  - Left ventricle: The cavity size was severely dilated. Wall thickness was increased in a pattern of mild LVH. The estimated ejection fraction was 15%. Diffuse hypokinesis. Doppler parameters are consistent with abnormal left ventricular relaxation (grade 1 diastolic dysfunction). - Mitral valve: Calcified annulus. There was mild regurgitation. - Left atrium: The atrium was mildly dilated. - Right ventricle: Systolic function was moderately reduced.  Impressions:  - Severe global reduction in LV function; grade 1 diastolic dysfunction; mild LAE; mild MR; moderately reduced RV function. Compared to 08/03/13, LV function remains severely reduced; diastolic dysfunction now grade 1.   Cath 07/2013 Hemodynamic Findings: Ao:  107/69             LV: 101/28/35 RA: 17        RV: 53/11/20 PA:  45/21/ (mean 34)       PCWP:  32 Fick Cardiac Output: 3.46  L/min Fick Cardiac Index: 1.51 L/min/m2 Central Aortic Saturation: 93% Pulmonary Artery Saturation: 49%  Angiographic Findings:  Left main: No obstructive disease.   Left Anterior Descending Artery: Large caliber vessel that courses to the apex. 50% proximal stenosis. 50% mid  stenosis with competitive filling seen distally from graft. Mid and distal vessel fills from the patent IMA graft. The diagonal branch is patent, moderate in caliber with proximal 60% stenosis. The graft to the diagonal is occluded.   Circumflex Artery: Large caliber vessel with 50% mid stenosis. There is a large caliber obtuse marginal branch with 100% occlusion, fills from patent vein graft. The left posterolateral branch has a focal 60-70% stenosis. The vein graft that supplied the PL branch (sequential limb) is occluded.   Right Coronary Artery: Large caliber dominant vessel with 99% mid stenosis. The mid and distal vessel fills from the patent free RIMA graft.   Graft Anatomy:  SVG to OM1/sequential to left posterolateral branch is patent to the OM but the sequential limb to the PL is occluded SVG to Diagonal is occluded LIMA to LAD is patent Free RIMA to RCA is patent  Left Ventricular Angiogram: Deferred.   Impression: 1. Severe triple vessel CAD s/p 5V CABG with 3/5 patent bypass grafts. The graft to the left sided PL is occluded and the graft to the diagonal is occluded (both appear to be chronic occlusions) 2. Moderate disease in diagonal and left sided posterolateral branch, does not appear to be flow limiting.  3. Elevated filling pressures  Recommendations: Continue medical management of CAD. He appears to be volume overloaded. This is most likely contributing to his dyspnea and hypoxia. Continue diuresis with IV Lasix.    ASSESSMENT & PLAN:    1. CAD - No anginal pain. Continue BB, statin and ARB. Not on ASA due to need of anticoagulation. Last cath as above.   2. ICM/chronic systolic  CHG - EF of 15% on echo 09/2013 s/p ICD implantation 10/2013. Euvolemic. Will update echo. Continue BB, ARB and lasix. Discontinued aldactone due to hyperkalemia in past. Work note given. For further question will defer to Dr. Graciela HusbandsKlein.   3. HTN - States that his BP always runs in upper high limit. Today 142/92. He feels dizzy in 120s range. Will not change any medication currently.   4. HLD -07/03/2015: Cholesterol 120; HDL 31.20; LDL Cholesterol 57; Triglycerides 161.0; VLDL 32.2./ Continue statin.   5. H/o of atrial flutter s/p ablation  - sinus rhythm today. Continue Xarelto for anticoagulation. Noted scatted bruise. Will get CBC.     Medication Adjustments/Labs and Tests Ordered: Current medicines are reviewed at length with the patient today.  Concerns regarding medicines are outlined above.  Medication changes, Labs and Tests ordered today are listed in the Patient Instructions below. Patient Instructions  Medication Instructions:  Your physician recommends that you continue on your current medications as directed. Please refer to the Current Medication list given to you today.   Labwork: Cbc Today  Testing/Procedures: Your physician has requested that you have an echocardiogram. Echocardiography is a painless test that uses sound waves to create images of your heart. It provides your doctor with information about the size and shape of your heart and how well your heart's chambers and valves are working. This procedure takes approximately one hour. There are no restrictions for this procedure.    Follow-Up: Your physician wants you to follow-up in: 6 months with Dr.Klein You will receive a reminder letter in the mail two months in advance. If you don't receive a letter, please call our office to schedule the follow-up appointment.   Any Other Special Instructions Will Be Listed Below (If Applicable).     If you need a refill on your cardiac medications  before your next  appointment, please call your pharmacy.      Lorelei Pont, [place]  10/01/2015 10:21 AM    [facility] [address] [place], [place], [place]  [address] Phone: [phone]; Fax: [phone]

## 2015-10-03 ENCOUNTER — Ambulatory Visit (HOSPITAL_COMMUNITY): Payer: Commercial Managed Care - HMO | Attending: Cardiology

## 2015-10-03 ENCOUNTER — Other Ambulatory Visit: Payer: Self-pay

## 2015-10-03 DIAGNOSIS — R931 Abnormal findings on diagnostic imaging of heart and coronary circulation: Secondary | ICD-10-CM | POA: Insufficient documentation

## 2015-10-03 DIAGNOSIS — I255 Ischemic cardiomyopathy: Secondary | ICD-10-CM | POA: Insufficient documentation

## 2015-10-05 ENCOUNTER — Telehealth: Payer: Self-pay | Admitting: Physician Assistant

## 2015-10-05 NOTE — Telephone Encounter (Signed)
Pt aware of his echo results and has once again been advised to check his blood pressure and keep a log of it and bring it in with him to his next o/v with Dr. Graciela Husbands. Pt agreeable with this plan and verbalized understanding.

## 2015-10-05 NOTE — Telephone Encounter (Signed)
Per pt returning this office for his results please give him a call back.

## 2015-10-05 NOTE — Telephone Encounter (Signed)
Returned pts call, and had to leave another message for pt to call back.

## 2015-10-05 NOTE — Telephone Encounter (Signed)
Per pt returning this office for his results please give him a call back. 

## 2015-10-05 NOTE — Telephone Encounter (Signed)
F/u message ° °Pt returning RN call. Please call back to discuss  °

## 2015-10-12 ENCOUNTER — Other Ambulatory Visit: Payer: Self-pay | Admitting: Family Medicine

## 2015-10-12 NOTE — Telephone Encounter (Signed)
Requesting:   Clonazepam Contract   02/23/2014 UDS  Low risk Last OV   06/08/2015 Last Refill   #30 with 0 refills on 09/17/2015  Please Advise

## 2015-10-12 NOTE — Telephone Encounter (Signed)
Faxed hardcopy [facility] to [facility] [address] [place]

## 2015-10-15 ENCOUNTER — Other Ambulatory Visit: Payer: Self-pay | Admitting: Family Medicine

## 2015-11-08 ENCOUNTER — Other Ambulatory Visit: Payer: Self-pay | Admitting: Internal Medicine

## 2015-11-09 ENCOUNTER — Ambulatory Visit (INDEPENDENT_AMBULATORY_CARE_PROVIDER_SITE_OTHER): Payer: Commercial Managed Care - HMO | Admitting: Pulmonary Disease

## 2015-11-09 ENCOUNTER — Encounter: Payer: Self-pay | Admitting: Pulmonary Disease

## 2015-11-09 VITALS — BP 132/74 | HR 61 | Ht 74.0 in | Wt 201.0 lb

## 2015-11-09 DIAGNOSIS — J439 Emphysema, unspecified: Secondary | ICD-10-CM

## 2015-11-09 NOTE — Progress Notes (Signed)
Larry Rangel    [id number]    04/10/1950  Primary Care Physician:BLYTH, Misty StanleySTACEY, MD  Referring Physician: Bradd CanaryStacey A Blyth, MD [address] [place], [place] [id number]  Chief complaint:  Follow-up for moderate COPD  HPI: Mr Larry Rangel is a 65 year old with extensive medical history including CAD, chronic systolic heart failure (LVEF 15% in 2015), CABG 5, AICD. Complains of dyspnea and exertion. 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 He has increasing dyspnea on exertion. He can't climb more than a flight of stairs without getting short of breath. He denies any cough, sputum production, wheezing.   Started on Spiriva inhaler this year. He notes an marked improvement in symptoms but stopped taking it as he is feeling better. He feels well in office today with no respiratory complaints.   Outpatient Encounter Prescriptions as of 11/09/2015  Medication Sig  . albuterol (PROVENTIL HFA;VENTOLIN HFA) 108 (90 BASE) MCG/ACT inhaler Inhale 2 puffs into the lungs every 6 (six) hours as needed for wheezing or shortness of breath.  Marland Kitchen. atorvastatin (LIPITOR) 20 MG tablet TAKE 1 TABLET (20 MG TOTAL) BY MOUTH DAILY.  . busPIRone (BUSPAR) 10 MG tablet TAKE 1 TABLET BY MOUTH TWICE A DAY  . carvedilol (COREG) 12.5 MG tablet TAKE 1 TABLET BY MOUTH TWICE DAILY WITH A MEAL  . clonazePAM (KLONOPIN) 0.5 MG tablet TAKE 1 TABLET BY MOUTH 3 TIMES A DAY  . furosemide (LASIX) 40 MG tablet TAKE 1 TABLET BY MOUTH DAILY. IF WEIGHT INCREASED > 3LBS OVERNIGHT INCREASE BY ONE TABLET  . glipiZIDE (GLUCOTROL) 5 MG tablet TAKE 1/2 TABLET (2.5 MG TOTAL) BY MOUTH ONCE A DAY BEFORE BREAKFAST  . irbesartan (AVAPRO) 150 MG tablet TAKE 1 TABLET (150 MG TOTAL) BY MOUTH DAILY.  . metFORMIN (GLUCOPHAGE) 500 MG tablet Take 500 mg by mouth daily with breakfast.  . OVER THE COUNTER MEDICATION Place 1 drop into both eyes as needed (for dry eyes). "OTC Good Sense Eye  Drops"  . XARELTO 20 MG TABS tablet TAKE 1 TABLET BY MOUTH EVERY DAY WITH SUPPER   No facility-administered encounter medications on file as of 11/09/2015.     Allergies as of 11/09/2015  . (No Known Allergies)    Past Medical History:  Diagnosis Date  . Abnormal liver function 08/23/2010  . Anxiety   . Arthritis   . CAD (coronary artery disease)    a. s/p CABG 2009. b. Cath 07/2013: 3/5 patent grafts (appear to have chronic occ grafts), mod diag/L PL branch, did not appear flow limiting, elevated filling pressures.  . Chicken pox as a child  . Chronic combined systolic and diastolic CHF (congestive heart failure) (HCC)    a. Echo 6/14: Mild LVH, EF 30-35%, diffuse HK, MAC, mild BAE. b. Drop in EF to 20% by echo 07/2013.  . Diabetes mellitus (HCC)   . Hyperlipidemia   . Hypertension   . LBBB (left bundle branch block)   . Low back pain   . Measles as a child  . Mumps as a child  . Myocardial infarction   . PAF (paroxysmal atrial fibrillation) (HCC)   . Paroxysmal atrial flutter (HCC) 07-2012   a. s/p ablation by Dr Graciela HusbandsKlein 08-06-2012. b. Recurrence 07/2013.   Marland Kitchen. Preventative health care 12/10/2014  . S/P colonoscopy   . Tobacco user     Past Surgical History:  Procedure Laterality Date  .  ABLATION OF DYSRHYTHMIC FOCUS  08/06/2012   CTI ablation by Dr Graciela Husbands  . ATRIAL FLUTTER ABLATION N/A 08/06/2012   Procedure: ATRIAL FLUTTER ABLATION;  Surgeon: Duke Salvia, MD;  Location: [facility] CATH LAB;  Service: Cardiovascular;  Laterality: N/A;  . CARDIAC CATHETERIZATION    . CARDIOVERSION N/A 07/15/2012   Procedure: CARDIOVERSION;  Surgeon: Vesta Mixer, MD;  Location: [facility] ENDOSCOPY;  Service: Cardiovascular;  Laterality: N/A;  . CORONARY ARTERY BYPASS GRAFT  2009   x 5  . IMPLANTABLE CARDIOVERTER DEFIBRILLATOR IMPLANT N/A 11/10/2013   Procedure: SUB Q IMPLANTABLE CARDIOVERTER DEFIBRILLATOR IMPLANT;  Surgeon: Duke Salvia, MD;  Location: [facility] CATH LAB;  Service: Cardiovascular;  Laterality:  N/A;  . LEFT HEART CATHETERIZATION WITH CORONARY ANGIOGRAM N/A 08/01/2013   Procedure: LEFT HEART CATHETERIZATION WITH CORONARY ANGIOGRAM;  Surgeon: Kathleene Hazel, MD;  Location: [facility] CATH LAB;  Service: Cardiovascular;  Laterality: N/A;  . POCKET REVISION N/A 12/16/2013   Procedure: POCKET REVISION;  Surgeon: Marinus Maw, MD;  Location: [facility] CATH LAB;  Service: Cardiovascular;  Laterality: N/A;  . SHOULDER SURGERY    . TEE WITHOUT CARDIOVERSION N/A 07/15/2012   Procedure: TRANSESOPHAGEAL ECHOCARDIOGRAM (TEE);  Surgeon: Vesta Mixer, MD;  Location: [facility] ENDOSCOPY;  Service: Cardiovascular;  Laterality: N/A;  . TONSILLECTOMY      Family History  Problem Relation Age of Onset  . Alzheimer's disease Mother   . Heart failure Father 49  . Hypertension Father   . Hyperlipidemia Father   . Cancer Father     lung- took half of a young- smoker  . Heart disease Father     MI at 76  . Leukemia Brother   . Drug abuse Daughter     heroine  . Early death Neg Hx   . Kidney disease Neg Hx   . Stroke Neg Hx     Social History   Social History  . Marital status: Married    Spouse name: N/A  . Number of children: N/A  . Years of education: N/A   Occupational History  . Not on file.   Social History Main Topics  . Smoking status: Former Smoker    Packs/day: 1.00    Years: 40.00    Types: Cigarettes    Quit date: 02/01/2011  . Smokeless tobacco: Never Used  . Alcohol use 14.4 oz/week    24 Glasses of wine per week     Comment: drinks 2 bottles of wine per week  . Drug use: No  . Sexual activity: Yes    Birth control/ protection: Coitus interruptus   Other Topics Concern  . 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   He has been married four times previously, the first  For 103yrs and has two children by the marriage which are grown, the second  time for 11 yrs and has 2 by that marriage.   Wife has custody in [place].   The third marriage was 4 1/2 yrs and most recent marriage was the past eight weeks and he is currently estranged from that person.     Review of systems: Review of Systems  Constitutional: Negative for fever and chills.  HENT: Negative.   Eyes: Negative for blurred vision.  Respiratory: as per HPI  Cardiovascular: Negative for chest pain and palpitations.  Gastrointestinal: Negative for  vomiting, diarrhea, blood per rectum. Genitourinary: Negative for dysuria, urgency, frequency and hematuria.  Musculoskeletal: Negative for myalgias, back pain and joint pain.  Skin: Negative for itching and rash.  Neurological: Negative for dizziness, tremors, focal weakness, seizures and loss of consciousness.  Endo/Heme/Allergies: Negative for environmental allergies.  Psychiatric/Behavioral: Negative for depression, suicidal ideas and hallucinations.  All other systems reviewed and are negative.   Physical Exam: Blood pressure 132/74, pulse 61, height 6\' 2"  (1.88 m), weight 201 lb (91.2 kg), SpO2 99 %. Gen:      No acute distress HEENT:  EOMI, sclera anicteric Neck:     No masses; no thyromegaly Lungs:    Clear to auscultation bilaterally; normal respiratory effort CV:         Regular rate and rhythm; no murmurs Abd:      + bowel sounds; soft, non-tender; no palpable masses, no distension Ext:    No edema; adequate peripheral perfusion Skin:      Warm and dry; no rash Neuro: alert and oriented x 3 Psych: normal mood and affect  Data Reviewed: CXR 01/16/15 No acute cardiopulmonary process.  PFTs 04/17/15 FVC 3.18 (62%) FEV1 2.2 (58%) F/F 69 TLC 77% DLCO 70%. Moderate obstructive defect mild reduction in lung volumes and diffusion. Airtrapping noted.  Assessment:  Moderate COPD, GOLD II    Although his symptoms improved on Spiriva he has stopped taking it as he feels he does not need this anymore. He  will continue to monitor his symptoms and resume the inhaler in case of worsening. PFTs and chest x-ray show moderate emphysema. I discussed further evaluation with alpha-1 antitrypsin levels and starting screening CT of the chest. He does not want to get any of these tests done.   He also has extensive cardiac history with EF 15%, systolic heart failure. Although he does not appear volume overloaded today his heart disease may be contributing to her symptoms as well.  Plan/Recommendations: - Observe off inhalers  Return in 1 year.   Chilton Greathouse MD [facility] Pager [phone] 11/09/2015, 9:56 AM  CC: Bradd Canary, MD

## 2015-11-09 NOTE — Patient Instructions (Signed)
Return to clinic in 1 year. Please call us sooner if you develop any respiratory issues.

## 2015-11-10 ENCOUNTER — Other Ambulatory Visit: Payer: Self-pay | Admitting: Internal Medicine

## 2015-11-10 ENCOUNTER — Other Ambulatory Visit: Payer: Self-pay | Admitting: Family Medicine

## 2015-11-12 ENCOUNTER — Telehealth: Payer: Self-pay | Admitting: Family Medicine

## 2015-11-12 NOTE — Telephone Encounter (Signed)
Caller name: Relationship to patient: Self Can be reached: [phone]  Pharmacy:  [id number] - [place], [place] - [address] [phone] (Phone) [phone] (Fax)     Reason for call: Refill on clonazePAM (KLONOPIN) 0.5 MG tablet [[id number]]

## 2015-11-12 NOTE — Telephone Encounter (Signed)
Called the patient informed refill faxed already and to check with pharmac

## 2015-11-12 NOTE — Telephone Encounter (Signed)
[facility] hardcopy [facility]

## 2015-11-23 ENCOUNTER — Other Ambulatory Visit: Payer: Self-pay | Admitting: Family Medicine

## 2015-12-10 ENCOUNTER — Ambulatory Visit (INDEPENDENT_AMBULATORY_CARE_PROVIDER_SITE_OTHER): Payer: Commercial Managed Care - HMO | Admitting: *Deleted

## 2015-12-10 ENCOUNTER — Other Ambulatory Visit: Payer: Self-pay | Admitting: Family Medicine

## 2015-12-10 DIAGNOSIS — I255 Ischemic cardiomyopathy: Secondary | ICD-10-CM | POA: Diagnosis not present

## 2015-12-10 NOTE — Telephone Encounter (Signed)
PCP approve this refill on 12/10/15, on counter for signature once she arrives in the office on 12/11/15 Will then [facility] [address] on 12/11/2015 (tuesday)

## 2015-12-11 ENCOUNTER — Other Ambulatory Visit (INDEPENDENT_AMBULATORY_CARE_PROVIDER_SITE_OTHER): Payer: Commercial Managed Care - HMO

## 2015-12-11 ENCOUNTER — Other Ambulatory Visit: Payer: Self-pay | Admitting: Family Medicine

## 2015-12-11 DIAGNOSIS — E785 Hyperlipidemia, unspecified: Secondary | ICD-10-CM

## 2015-12-11 DIAGNOSIS — R739 Hyperglycemia, unspecified: Secondary | ICD-10-CM

## 2015-12-11 LAB — COMPREHENSIVE METABOLIC PANEL
ALT: 19 U/L (ref 0–53)
AST: 24 U/L (ref 0–37)
Albumin: 4.1 g/dL (ref 3.5–5.2)
Alkaline Phosphatase: 71 U/L (ref 39–117)
BUN: 14 mg/dL (ref 6–23)
CALCIUM: 10 mg/dL (ref 8.4–10.5)
CHLORIDE: 103 meq/L (ref 96–112)
CO2: 30 meq/L (ref 19–32)
Creatinine, Ser: 1.05 mg/dL (ref 0.40–1.50)
GFR: 74.75 mL/min (ref >60.00)
GLUCOSE: 118 mg/dL — AB (ref 70–99)
POTASSIUM: 4.2 meq/L (ref 3.5–5.1)
Sodium: 140 mEq/L (ref 135–145)
Total Bilirubin: 1.4 mg/dL — ABNORMAL HIGH (ref 0.2–1.2)
Total Protein: 7 g/dL (ref 6.0–8.3)

## 2015-12-11 LAB — LIPID PANEL
CHOL/HDL RATIO: 3
CHOLESTEROL: 137 mg/dL (ref 0–200)
HDL: 48 mg/dL (ref >39.00)
LDL CALC: 62 mg/dL (ref 0–99)
NonHDL: 88.81
TRIGLYCERIDES: 132 mg/dL (ref 0.0–149.0)
VLDL: 26.4 mg/dL (ref 0.0–40.0)

## 2015-12-11 LAB — HEMOGLOBIN A1C: Hgb A1c MFr Bld: 5.1 % (ref 4.6–6.5)

## 2015-12-11 NOTE — Progress Notes (Signed)
Remote ICD transmission.   

## 2015-12-11 NOTE — Telephone Encounter (Signed)
Faxed [facility]copy [facility] [address]

## 2015-12-13 ENCOUNTER — Encounter: Payer: Self-pay | Admitting: Cardiology

## 2015-12-18 ENCOUNTER — Ambulatory Visit (INDEPENDENT_AMBULATORY_CARE_PROVIDER_SITE_OTHER): Payer: Commercial Managed Care - HMO | Admitting: Family Medicine

## 2015-12-18 VITALS — BP 160/82 | HR 75 | Temp 97.8°F | Wt 199.8 lb

## 2015-12-18 DIAGNOSIS — R945 Abnormal results of liver function studies: Secondary | ICD-10-CM

## 2015-12-18 DIAGNOSIS — E669 Obesity, unspecified: Secondary | ICD-10-CM

## 2015-12-18 DIAGNOSIS — E1169 Type 2 diabetes mellitus with other specified complication: Secondary | ICD-10-CM

## 2015-12-18 DIAGNOSIS — R350 Frequency of micturition: Secondary | ICD-10-CM

## 2015-12-18 DIAGNOSIS — Z1211 Encounter for screening for malignant neoplasm of colon: Secondary | ICD-10-CM | POA: Diagnosis not present

## 2015-12-18 DIAGNOSIS — I480 Paroxysmal atrial fibrillation: Secondary | ICD-10-CM

## 2015-12-18 DIAGNOSIS — K7689 Other specified diseases of liver: Secondary | ICD-10-CM

## 2015-12-18 DIAGNOSIS — E785 Hyperlipidemia, unspecified: Secondary | ICD-10-CM

## 2015-12-18 DIAGNOSIS — G4733 Obstructive sleep apnea (adult) (pediatric): Secondary | ICD-10-CM

## 2015-12-18 DIAGNOSIS — Z0000 Encounter for general adult medical examination without abnormal findings: Secondary | ICD-10-CM | POA: Diagnosis not present

## 2015-12-18 DIAGNOSIS — I10 Essential (primary) hypertension: Secondary | ICD-10-CM | POA: Diagnosis not present

## 2015-12-18 DIAGNOSIS — I2510 Atherosclerotic heart disease of native coronary artery without angina pectoris: Secondary | ICD-10-CM

## 2015-12-18 NOTE — Assessment & Plan Note (Signed)
Well controlled, no changes to meds. Encouraged heart healthy diet such as the DASH diet and exercise as tolerated.  °

## 2015-12-18 NOTE — Progress Notes (Signed)
Patient ID: Larry Rangel, male   DOB: [birth year], 65 y.o.   MRN: [id number]   Subjective:    Patient ID: Larry Rangel, male    DOB: 07-23-1950, 65 y.o.   MRN: [id number]  Chief Complaint  Patient presents with  . Annual Exam    HPI Patient is in today for annual preventative exam and follow up on chronic medical conditions. He reports feeling well since retirement. No recent hospitalization or acute concerns. He is doing well with ADLs and his family has been doing well. Denies CP/palp/SOB/HA/congestion/fevers/GI or GU c/o. Taking meds as prescribed. No polyuria or polydipsia. He is trying to maintain a heart healthy and stay active.   Past Medical History:  Diagnosis Date  . Abnormal liver function 08/23/2010  . Anxiety   . Arthritis   . CAD (coronary artery disease)    a. s/p CABG 2009. b. Cath 07/2013: 3/5 patent grafts (appear to have chronic occ grafts), mod diag/L PL branch, did not appear flow limiting, elevated filling pressures.  . Chicken pox as a child  . Chronic combined systolic and diastolic CHF (congestive heart failure) (HCC)    a. Echo 6/14: Mild LVH, EF 30-35%, diffuse HK, MAC, mild BAE. b. Drop in EF to 20% by echo 07/2013.  . Diabetes mellitus (HCC)   . Hyperlipidemia   . Hypertension   . LBBB (left bundle branch block)   . Low back pain   . Measles as a child  . Mumps as a child  . Myocardial infarction   . PAF (paroxysmal atrial fibrillation) (HCC)   . Paroxysmal atrial flutter (HCC) 07-2012   a. s/p ablation by Dr Graciela Husbands 08-06-2012. b. Recurrence 07/2013.   Marland Kitchen Preventative health care 12/10/2014  . S/P colonoscopy   . Tobacco user     Past Surgical History:  Procedure Laterality Date  . ABLATION OF DYSRHYTHMIC FOCUS  08/06/2012   CTI ablation by Dr Graciela Husbands  . ATRIAL FLUTTER ABLATION N/A 08/06/2012   Procedure: ATRIAL FLUTTER ABLATION;  Surgeon: Duke Salvia, MD;  Location: [facility] CATH LAB;  Service: Cardiovascular;  Laterality: N/A;  . CARDIAC  CATHETERIZATION    . CARDIOVERSION N/A 07/15/2012   Procedure: CARDIOVERSION;  Surgeon: Vesta Mixer, MD;  Location: [facility] ENDOSCOPY;  Service: Cardiovascular;  Laterality: N/A;  . CORONARY ARTERY BYPASS GRAFT  2009   x 5  . IMPLANTABLE CARDIOVERTER DEFIBRILLATOR IMPLANT N/A 11/10/2013   Procedure: SUB Q IMPLANTABLE CARDIOVERTER DEFIBRILLATOR IMPLANT;  Surgeon: Duke Salvia, MD;  Location: [facility] CATH LAB;  Service: Cardiovascular;  Laterality: N/A;  . LEFT HEART CATHETERIZATION WITH CORONARY ANGIOGRAM N/A 08/01/2013   Procedure: LEFT HEART CATHETERIZATION WITH CORONARY ANGIOGRAM;  Surgeon: Kathleene Hazel, MD;  Location: [facility] CATH LAB;  Service: Cardiovascular;  Laterality: N/A;  . POCKET REVISION N/A 12/16/2013   Procedure: POCKET REVISION;  Surgeon: Marinus Maw, MD;  Location: [facility] CATH LAB;  Service: Cardiovascular;  Laterality: N/A;  . SHOULDER SURGERY    . TEE WITHOUT CARDIOVERSION N/A 07/15/2012   Procedure: TRANSESOPHAGEAL ECHOCARDIOGRAM (TEE);  Surgeon: Vesta Mixer, MD;  Location: [facility] ENDOSCOPY;  Service: Cardiovascular;  Laterality: N/A;  . TONSILLECTOMY      Family History  Problem Relation Age of Onset  . Alzheimer's disease Mother   . Heart failure Father 4  . Hypertension Father   . Hyperlipidemia Father   . Cancer Father     lung- took half of a young- smoker  . Heart disease Father  MI at [id number]  . Leukemia Brother   . Drug abuse Daughter     heroine  . Early death Neg Hx   . Kidney disease Neg Hx   . Stroke Neg Hx     Social History   Social History  . Marital status: Married    Spouse name: N/A  . Number of children: N/A  . Years of education: N/A   Occupational History  . Not on file.   Social History Main Topics  . Smoking status: Former Smoker    Packs/day: 1.00    Years: 40.00    Types: Cigarettes    Quit date: 02/01/2011  . Smokeless tobacco: Never Used  . Alcohol use 14.4 oz/week    24 Glasses of wine per week     Comment: drinks 2  bottles of wine per week  . Drug use: No  . Sexual activity: Yes    Birth control/ protection: Coitus interruptus   Other Topics Concern  . 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   He has been married four times previously, the first  For 7424yrs and has two children by the marriage which are grown, the second time for 11 yrs and has 2 by that marriage.   [place] custody [place].   The third marriage was 4 1/2 yrs and most recent marriage was the past eight weeks and he is currently estranged from that person.    Outpatient Medications Prior to Visit  Medication Sig Dispense Refill  . albuterol (PROVENTIL HFA;VENTOLIN HFA) 108 (90 BASE) MCG/ACT inhaler Inhale 2 puffs into the lungs every 6 (six) hours as needed for wheezing or shortness of breath. 1 Inhaler 2  . atorvastatin (LIPITOR) 20 MG tablet TAKE 1 TABLET (20 MG TOTAL) BY MOUTH DAILY. 90 tablet 1  . busPIRone (BUSPAR) 10 MG tablet TAKE 1 TABLET BY MOUTH TWICE A DAY 180 tablet 1  . carvedilol (COREG) 12.5 MG tablet TAKE 1 TABLET BY MOUTH TWICE DAILY WITH A MEAL 60 tablet 10  . clonazePAM (KLONOPIN) 0.5 MG tablet TAKE 1 TABLET BY MOUTH 3 TIMES A DAY 30 tablet 1  . furosemide (LASIX) 40 MG tablet TAKE 1 TABLET BY MOUTH DAILY. IF WEIGHT INCREASED > 3LBS OVERNIGHT INCREASE BY ONE TABLET 45 tablet 10  . glipiZIDE (GLUCOTROL) 5 MG tablet TAKE 1/2 TABLET (2.5 MG TOTAL) BY MOUTH ONCE A DAY BEFORE BREAKFAST 15 tablet 12  . irbesartan (AVAPRO) 150 MG tablet TAKE 1 TABLET (150 MG TOTAL) BY MOUTH DAILY. 30 tablet 11  . metFORMIN (GLUCOPHAGE) 500 MG tablet Take 500 mg by mouth daily with breakfast.    . metFORMIN (GLUCOPHAGE) 500 MG tablet TAKE 1 TABLET BY MOUTH TWICE DAILY WITH A MEAL 60 tablet 0  . OVER THE COUNTER MEDICATION Place 1 drop into both eyes as needed (for dry eyes). "OTC Good Sense Eye  Drops"    . XARELTO 20 MG TABS tablet TAKE 1 TABLET BY MOUTH EVERY DAY WITH SUPPER 30 tablet 11   No facility-administered medications prior to visit.     No Known Allergies  Review of Systems  Constitutional: Negative for fever.  HENT: Negative for congestion.   Eyes: Negative.  Negative for blurred vision.  Respiratory: Negative for cough and shortness of breath.   Cardiovascular: Negative for chest pain and palpitations.  Gastrointestinal: Negative for vomiting.  Musculoskeletal: Negative for back pain.  Skin: Negative for rash.  Neurological: Negative for tingling, loss of consciousness and headaches.  Endo/Heme/Allergies: Negative for environmental allergies.       Objective:    Physical Exam  Constitutional: He is oriented to person, place, and time. He appears well-developed and well-nourished. No distress.  HENT:  Head: Normocephalic and atraumatic.  Eyes: Conjunctivae are normal.  Neck: Normal range of motion. No thyromegaly present.  Cardiovascular: Normal rate and regular rhythm.   Pulmonary/Chest: Effort normal and breath sounds normal. He has no wheezes.  Abdominal: Soft. Bowel sounds are normal. There is no tenderness.  Musculoskeletal: Normal range of motion. He exhibits no edema or deformity.  Lymphadenopathy:    He has no cervical adenopathy.  Neurological: He is alert and oriented to person, place, and time.  Skin: Skin is warm and dry. He is not diaphoretic.  Psychiatric: He has a normal mood and affect.    There were no vitals taken for this visit. Wt Readings from Last 3 Encounters:  11/09/15 201 lb (91.2 kg)  10/01/15 201 lb 1.9 oz (91.2 kg)  06/08/15 209 lb 2 oz (94.9 kg)     Lab Results  Component Value Date   WBC 7.5 10/01/2015   HGB 15.5 10/01/2015   HCT 45.2 10/01/2015   PLT 159 10/01/2015   GLUCOSE 118 (H) 12/11/2015   CHOL 137 12/11/2015   TRIG 132.0 12/11/2015   HDL 48.00 12/11/2015   LDLDIRECT 62.0 12/05/2014   LDLCALC 62  12/11/2015   ALT 19 12/11/2015   AST 24 12/11/2015   NA 140 12/11/2015   K 4.2 12/11/2015   CL 103 12/11/2015   CREATININE 1.05 12/11/2015   BUN 14 12/11/2015   CO2 30 12/11/2015   TSH 0.62 07/03/2015   PSA 1.86 04/16/2012   INR 2.88 (H) 02/14/2014   HGBA1C 5.1 12/11/2015   MICROALBUR 4.5 (H) 07/03/2015    Lab Results  Component Value Date   TSH 0.62 07/03/2015   Lab Results  Component Value Date   WBC 7.5 10/01/2015   HGB 15.5 10/01/2015   HCT 45.2 10/01/2015   MCV 89.3 10/01/2015   PLT 159 10/01/2015   Lab Results  Component Value Date   NA 140 12/11/2015   K 4.2 12/11/2015   CO2 30 12/11/2015   GLUCOSE 118 (H) 12/11/2015   BUN 14 12/11/2015   CREATININE 1.05 12/11/2015   BILITOT 1.4 (H) 12/11/2015   ALKPHOS 71 12/11/2015   AST 24 12/11/2015   ALT 19 12/11/2015   PROT 7.0 12/11/2015   ALBUMIN 4.1 12/11/2015   CALCIUM 10.0 12/11/2015   ANIONGAP 9 02/14/2014   GFR 74.75 12/11/2015   Lab Results  Component Value Date   CHOL 137 12/11/2015   Lab Results  Component Value Date   HDL 48.00 12/11/2015   Lab Results  Component Value Date   LDLCALC 62 12/11/2015   Lab Results  Component Value Date   TRIG 132.0 12/11/2015   Lab Results  Component Value Date   CHOLHDL 3 12/11/2015   Lab Results  Component Value Date   HGBA1C 5.1 12/11/2015   I acted as a Neurosurgeon for Dr. Abner Greenspan. Princess, RMA     Assessment & Plan:   Problem List Items Addressed This Visit    None      I am having Mr. Dowtin maintain his OVER THE COUNTER MEDICATION, albuterol, glipiZIDE, busPIRone, metFORMIN, irbesartan, XARELTO, atorvastatin, furosemide, carvedilol,  metFORMIN, and clonazePAM.  No orders of the defined types were placed in this encounter.    Crissie Sickles, [place]

## 2015-12-18 NOTE — Assessment & Plan Note (Signed)
Follows with cardiology and doing well

## 2015-12-18 NOTE — Assessment & Plan Note (Signed)
Follows with pulmonology doing well 

## 2015-12-18 NOTE — Progress Notes (Signed)
Pre visit review using our clinic review tool, if applicable. No additional management support is needed unless otherwise documented below in the visit note. 

## 2015-12-18 NOTE — Assessment & Plan Note (Signed)
Patient encouraged to maintain heart healthy diet, regular exercise, adequate sleep. Consider daily probiotics. Take medications as prescribed. Has ACP encouraged to bring [place] a copy

## 2015-12-18 NOTE — Assessment & Plan Note (Signed)
Tolerating statin, encouraged heart healthy diet, avoid trans fats, minimize simple carbs and saturated fats. Increase exercise as tolerated 

## 2015-12-18 NOTE — Assessment & Plan Note (Signed)
hgba1c acceptable, minimize simple carbs. Increase exercise as tolerated. Continue current meds 

## 2015-12-18 NOTE — Patient Instructions (Signed)
Preventive Care 67 Years and Older, Male Preventive care refers to lifestyle choices and visits with your health care provider that can promote health and wellness. What does preventive care include?  A yearly physical exam. This is also called an annual well check.  Dental exams once or twice a year.  Routine eye exams. Ask your health care provider how often you should have your eyes checked.  Personal lifestyle choices, including:  Daily care of your teeth and gums.  Regular physical activity.  Eating a healthy diet.  Avoiding tobacco and drug use.  Limiting alcohol use.  Practicing safe sex.  Taking low doses of aspirin every day.  Taking vitamin and mineral supplements as recommended by your health care provider. What happens during an annual well check? The services and screenings done by your health care provider during your annual well check will depend on your age, overall health, lifestyle risk factors, and family history of disease. Counseling  Your health care provider may ask you questions about your:  Alcohol use.  Tobacco use.  Drug use.  Emotional well-being.  Home and relationship well-being.  Sexual activity.  Eating habits.  History of falls.  Memory and ability to understand (cognition).  Work and work environment. Screening  You may have the following tests or measurements:  Height, weight, and BMI.  Blood pressure.  Lipid and cholesterol levels. These may be checked every 5 years, or more frequently if you are over 50 years old.  Skin check.  Lung cancer screening. You may have this screening every year starting at age 55 if you have a 30-pack-year history of smoking and currently smoke or have quit within the past 15 years.  Fecal occult blood test (FOBT) of the stool. You may have this test every year starting at age 50.  Flexible sigmoidoscopy or colonoscopy. You may have a sigmoidoscopy every 5 years or a colonoscopy every 10  years starting at age 50.  Prostate cancer screening. Recommendations will vary depending on your family history and other risks.  Hepatitis C blood test.  Hepatitis B blood test.  Sexually transmitted disease (STD) testing.  Diabetes screening. This is done by checking your blood sugar (glucose) after you have not eaten for a while (fasting). You may have this done every 1-3 years.  Abdominal aortic aneurysm (AAA) screening. You may need this if you are a current or former smoker.  Osteoporosis. You may be screened starting at age 70 if you are at high risk. Talk with your health care provider about your test results, treatment options, and if necessary, the need for more tests. Vaccines  Your health care provider may recommend certain vaccines, such as:  Influenza vaccine. This is recommended every year.  Tetanus, diphtheria, and acellular pertussis (Tdap, Td) vaccine. You may need a Td booster every 10 years.  Varicella vaccine. You may need this if you have not been vaccinated.  Zoster vaccine. You may need this after age 60.  Measles, mumps, and rubella (MMR) vaccine. You may need at least one dose of MMR if you were born in 1957 or later. You may also need a second dose.  Pneumococcal 13-valent conjugate (PCV13) vaccine. One dose is recommended after age 67.  Pneumococcal polysaccharide (PPSV23) vaccine. One dose is recommended after age 67.  Meningococcal vaccine. You may need this if you have certain conditions.  Hepatitis A vaccine. You may need this if you have certain conditions or if you travel or work in places where   you may be exposed to hepatitis A.  Hepatitis B vaccine. You may need this if you have certain conditions or if you travel or work in places where you may be exposed to hepatitis B.  Haemophilus influenzae type b (Hib) vaccine. You may need this if you have certain risk factors. Talk to your health care provider about which screenings and vaccines you  need and how often you need them. This information is not intended to replace advice given to you by your health care provider. Make sure you discuss any questions you have with your health care provider. Document Released: 01/26/2015 Document Revised: 09/19/2015 Document Reviewed: 10/31/2014 Elsevier Interactive Patient Education  2017 [employer].

## 2016-01-08 ENCOUNTER — Telehealth: Payer: Self-pay | Admitting: *Deleted

## 2016-01-08 NOTE — Telephone Encounter (Signed)
called pt to inform him that xareltro SHOULD go back down, pt expressed understanding. Left one box of samples at front desk for pt.

## 2016-01-10 ENCOUNTER — Other Ambulatory Visit: Payer: Self-pay | Admitting: Family Medicine

## 2016-01-10 NOTE — Telephone Encounter (Signed)
Faxed hardcopy for [facility]epam to [facility] [address] [place] 

## 2016-01-10 NOTE — Telephone Encounter (Signed)
Requesting:  Clonazepam Contract   02/23/2014 UDS  Low risk Last OV     12/18/2015 Last Refill    #30 with 1 refill 12/11/2015  Please Advise

## 2016-01-15 LAB — CUP PACEART REMOTE DEVICE CHECK
Battery Remaining Percentage: 76 %
Date Time Interrogation Session: 20171127124800
Implantable Lead Implant Date: 20151029
Implantable Lead Model: 3401
Implantable Pulse Generator Implant Date: 20151029
MDC IDC LEAD LOCATION: 753862
Pulse Gen Serial Number: 108164

## 2016-01-18 ENCOUNTER — Encounter: Payer: Self-pay | Admitting: Family Medicine

## 2016-01-20 ENCOUNTER — Other Ambulatory Visit: Payer: Self-pay | Admitting: Family Medicine

## 2016-01-23 ENCOUNTER — Other Ambulatory Visit: Payer: Self-pay | Admitting: Family Medicine

## 2016-01-23 NOTE — Telephone Encounter (Signed)
Seems too quick for a refill of this med. Last filled 01/10/2016. Also he does not have another appt til next December if he wants to refill this med routinely he needs to be seen more often. Every 3-6 months.

## 2016-01-23 NOTE — Telephone Encounter (Signed)
Last Rf: 12/31/2015 Last Ov: 12/18/2015 Next Ov: 12/22/2016 UDS: 02/23/14 controlled substance contract signed, uds sample given, Low risk, next screen 08/24/14.  Forwarded to the Provider for review, approval or denial.

## 2016-01-25 ENCOUNTER — Other Ambulatory Visit: Payer: Self-pay

## 2016-01-25 MED ORDER — CLONAZEPAM 0.5 MG PO TABS: ORAL_TABLET | ORAL | 0 refills | Status: DC

## 2016-01-25 NOTE — Telephone Encounter (Signed)
Patient called to follow up on this medication refill request. Please advise  Patient phone: [phone]

## 2016-01-25 NOTE — Progress Notes (Signed)
Patient is to schedule with PCP in order to receive more refills.   PC

## 2016-01-29 NOTE — Telephone Encounter (Signed)
No one spoke with this patient. Larry Rangel was the CMA to send the first request she must have not followed up or it was not sent to her.  I did not send the first message even though it said I did.  That is not how I send RX request for control substances.  PC

## 2016-01-31 ENCOUNTER — Ambulatory Visit: Payer: Commercial Managed Care - HMO | Admitting: Family Medicine

## 2016-02-04 ENCOUNTER — Other Ambulatory Visit: Payer: Self-pay

## 2016-02-04 ENCOUNTER — Other Ambulatory Visit: Payer: Self-pay | Admitting: Family Medicine

## 2016-02-04 NOTE — Telephone Encounter (Signed)
Faxed a hard [id number]: [phone] for Xanax 0.5mg  #10, 0RF.  Per Dr. Abner Greenspan, the Patient will need to be seen in the Office before any additional refills will be given.  Patient doesn't have an appointment until December 2018. For any controlled substance, Patient must be seen at least once every six months.  Last Ov: 12/18/2015 Next Ov: 12/22/2016

## 2016-02-04 NOTE — Telephone Encounter (Signed)
Requesting: clonazepam Contract   02/23/2014 UDS   Low risk Last OV    12/18/2015 Last Refill   #10 with 0 refills on 01/25/2016  Please Advise

## 2016-02-04 NOTE — Telephone Encounter (Signed)
[facility] [address] [place]

## 2016-02-04 NOTE — Telephone Encounter (Signed)
Prescription faxed today to

## 2016-02-06 ENCOUNTER — Other Ambulatory Visit: Payer: Self-pay | Admitting: Family Medicine

## 2016-02-06 NOTE — Telephone Encounter (Signed)
Last OV: 12/18/15 Last filled: 02/04/16, #10, 0 RF Sig: TAKE 1 TABLET BY MOUTH 3 TIMES A DAY AS NEEDED  UDS: last 02/23/14, positive for klonopin, low risk

## 2016-02-07 NOTE — Telephone Encounter (Signed)
He has been calling in for refill frequently which he has not done in the past. I can increase the number of tabs but he has to help me document why he has bbeen needing it more. Can give him #60 for one month but then at the end of that month he will need to come in to discuss how he is doing and develop a long term plan about what to do for him

## 2016-02-08 NOTE — Telephone Encounter (Signed)
Medication phoned to pharmacy as requested. Called patient and left message to return call.

## 2016-02-11 ENCOUNTER — Telehealth: Payer: Self-pay | Admitting: Family Medicine

## 2016-02-11 NOTE — Telephone Encounter (Signed)
Caller name: Relationship to patient: Self Can be reached: [phone]  Pharmacy:  [id number] [place], [place] - [address] [phone] (Phone) [phone] (Fax)     Reason for call: Request refill on clonazePAM (KLONOPIN) 0.5 MG tablet. Appt rescheduled for follow up due to snow

## 2016-02-12 NOTE — Telephone Encounter (Signed)
See telephone note dated 02/06/2016--Prescription has been phoned in and patient called left message done.

## 2016-02-14 NOTE — Telephone Encounter (Signed)
Called patient and left message to return call.  Pt has medication f/u appt w/ PCP 03/14/16. Current rx will not last him until that appt if he taking TID, so may need sooner appt.

## 2016-02-25 ENCOUNTER — Other Ambulatory Visit: Payer: Self-pay | Admitting: Family Medicine

## 2016-03-11 ENCOUNTER — Ambulatory Visit: Payer: Commercial Managed Care - HMO | Admitting: Family Medicine

## 2016-03-14 ENCOUNTER — Ambulatory Visit (INDEPENDENT_AMBULATORY_CARE_PROVIDER_SITE_OTHER): Payer: Medicare HMO | Admitting: Family Medicine

## 2016-03-14 ENCOUNTER — Encounter: Payer: Self-pay | Admitting: Family Medicine

## 2016-03-14 DIAGNOSIS — I2510 Atherosclerotic heart disease of native coronary artery without angina pectoris: Secondary | ICD-10-CM

## 2016-03-14 DIAGNOSIS — E669 Obesity, unspecified: Secondary | ICD-10-CM

## 2016-03-14 DIAGNOSIS — I509 Heart failure, unspecified: Secondary | ICD-10-CM

## 2016-03-14 DIAGNOSIS — I10 Essential (primary) hypertension: Secondary | ICD-10-CM

## 2016-03-14 DIAGNOSIS — E1169 Type 2 diabetes mellitus with other specified complication: Secondary | ICD-10-CM

## 2016-03-14 DIAGNOSIS — E785 Hyperlipidemia, unspecified: Secondary | ICD-10-CM | POA: Diagnosis not present

## 2016-03-14 NOTE — Patient Instructions (Signed)
Carbohydrate Counting for Diabetes Mellitus, Adult Carbohydrate counting is a method for keeping track of how many carbohydrates you eat. Eating carbohydrates naturally increases the amount of sugar (glucose) in the blood. Counting how many carbohydrates you eat helps keep your blood glucose within normal limits, which helps you manage your diabetes (diabetes mellitus). It is important to know how many carbohydrates you can safely have in each meal. This is different for every person. A diet and nutrition specialist (registered dietitian) can help you make a meal plan and calculate how many carbohydrates you should have at each meal and snack. Carbohydrates are found in the following foods:  Grains, such as breads and cereals.  Dried beans and soy products.  Starchy vegetables, such as potatoes, peas, and corn.  Fruit and fruit juices.  Milk and yogurt.  Sweets and snack foods, such as cake, cookies, candy, chips, and soft drinks. How do I count carbohydrates? There are two ways to count carbohydrates in food. You can use either of the methods or a combination of both. Reading "Nutrition Facts" on packaged food  The "Nutrition Facts" list is included on the labels of almost all packaged foods and beverages in the [place] It includes:  The serving size.  Information about nutrients in each serving, including the grams (g) of carbohydrate per serving. To use the "Nutrition Facts":  Decide how many servings you will have.  Multiply the number of servings by the number of carbohydrates per serving.  The resulting number is the total amount of carbohydrates that you will be having. Learning standard serving sizes of other foods  When you eat foods containing carbohydrates that are not packaged or do not include "Nutrition Facts" on the label, you need to measure the servings in order to count the amount of carbohydrates:  Measure the foods that you will eat with a food scale or measuring  cup, if needed.  Decide how many standard-size servings you will eat.  Multiply the number of servings by 15. Most carbohydrate-rich foods have about 15 g of carbohydrates per serving.  For example, if you eat 8 oz (170 g) of strawberries, you will have eaten 2 servings and 30 g of carbohydrates (2 servings x 15 g = 30 g).  For foods that have more than one food mixed, such as soups and casseroles, you must count the carbohydrates in each food that is included. The following list contains standard serving sizes of common carbohydrate-rich foods. Each of these servings has about 15 g of carbohydrates:   hamburger bun or  English muffin.   oz (15 mL) syrup.   oz (14 g) jelly.  1 slice of bread.  1 six-inch tortilla.  3 oz (85 g) cooked rice or pasta.  4 oz (113 g) cooked dried beans.  4 oz (113 g) starchy vegetable, such as peas, corn, or potatoes.  4 oz (113 g) hot cereal.  4 oz (113 g) mashed potatoes or  of a large baked potato.  4 oz (113 g) canned or frozen fruit.  4 oz (120 mL) fruit juice.  4-6 crackers.  6 chicken nuggets.  6 oz (170 g) unsweetened dry cereal.  6 oz (170 g) plain fat-free yogurt or yogurt sweetened with artificial sweeteners.  8 oz (240 mL) milk.  8 oz (170 g) fresh fruit or one small piece of fruit.  24 oz (680 g) popped popcorn. Example of carbohydrate counting Sample meal  3 oz (85 g) chicken breast.  6 oz (  170 g) brown rice.  4 oz (113 g) corn.  8 oz (240 mL) milk.  8 oz (170 g) strawberries with sugar-free whipped topping. Carbohydrate calculation 1. Identify the foods that contain carbohydrates:  Rice.  Corn.  Milk.  Strawberries. 2. Calculate how many servings you have of each food:  2 servings rice.  1 serving corn.  1 serving milk.  1 serving strawberries. 3. Multiply each number of servings by 15 g:  2 servings rice x 15 g = 30 g.  1 serving corn x 15 g = 15 g.  1 serving milk x 15 g = 15  g.  1 serving strawberries x 15 g = 15 g. 4. Add together all of the amounts to find the total grams of carbohydrates eaten:  30 g + 15 g + 15 g + 15 g = 75 g of carbohydrates total. This information is not intended to replace advice given to you by your health care provider. Make sure you discuss any questions you have with your health care provider. Document Released: 12/30/2004 Document Revised: 07/20/2015 Document Reviewed: 06/13/2015 Elsevier Interactive Patient Education  2017 Elsevier Inc.  

## 2016-03-14 NOTE — Progress Notes (Signed)
Pre visit review using our clinic review tool, if applicable. No additional management support is needed unless otherwise documented below in the visit note. 

## 2016-03-14 NOTE — Assessment & Plan Note (Signed)
Well controlled, no changes to meds. Encouraged heart healthy diet such as the DASH diet and exercise as tolerated.  °

## 2016-03-14 NOTE — Assessment & Plan Note (Signed)
hgba1c acceptable, minimize simple carbs. Increase exercise as tolerated. Continue current meds 

## 2016-03-14 NOTE — Assessment & Plan Note (Signed)
Tolerating statin, encouraged heart healthy diet, avoid trans fats, minimize simple carbs and saturated fats. Increase exercise as tolerated 

## 2016-03-14 NOTE — Assessment & Plan Note (Signed)
Doing great works on [employer] and stress reduction. No recent concerns.

## 2016-03-14 NOTE — Assessment & Plan Note (Signed)
No recent flares, minimize sodium

## 2016-03-14 NOTE — Progress Notes (Signed)
Subjective:  I acted as a Education administrator for Dr. Charlett Blake. [place], [place]   Patient ID: Larry Rangel, male    DOB: 08-Dec-1950, 66 y.o.   MRN: [id number]  Chief Complaint  Patient presents with  . Follow-up  . Medication Refill  . Hypertension  . Diabetes    Hypertension  The problem is controlled. Pertinent negatives include no blurred vision, malaise/fatigue, palpitations or shortness of breath.  Diabetes  He presents for his follow-up diabetic visit. There are no hypoglycemic associated symptoms. Pertinent negatives for diabetes include no blurred vision, no fatigue and no weakness. Symptoms are stable.    Patient is in today for follow up on hypertension, diabetes and other medical conditions, he continues to feel well. No recent febrile illness or hospitalizations. 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 ,Denies CP/palp/SOB/HA/congestion/fevers/GI or GU c/o. Taking meds as prescribed  Patient Care Team: Mosie Lukes, MD as PCP - General (Family Medicine) Deboraha Sprang, MD as Consulting Physician (Cardiology)   Past Medical History:  Diagnosis Date  . Abnormal liver function 08/23/2010  . Anxiety   . Arthritis   . CAD (coronary artery disease)    a. s/p CABG 2009. b. Cath 07/2013: 3/5 patent grafts (appear to have chronic occ grafts), mod diag/L PL branch, did not appear flow limiting, elevated filling pressures.  . CHF (congestive heart failure) ([place]) 07/30/2013  . Chicken pox as a child  . Chronic combined systolic and diastolic CHF (congestive heart failure) ([place])    a. Echo 6/14: Mild LVH, EF 30-35%, diffuse HK, MAC, mild BAE. b. Drop in EF to 20% by echo 07/2013.  . Diabetes mellitus ([place])   . Hyperlipidemia   . Hypertension   . LBBB (left bundle branch block)   . Low back pain   . Measles as a child  . Mumps as a child  . Myocardial infarction   . PAF (paroxysmal atrial fibrillation) ([place])   .  Paroxysmal atrial flutter ([place]) 07-2012   a. s/p ablation by Dr [name] 08-06-2012. b. Recurrence 07/2013.   Marland Kitchen Preventative health care 12/10/2014  . S/P colonoscopy   . Tobacco user     Past Surgical History:  Procedure Laterality Date  . ABLATION OF DYSRHYTHMIC FOCUS  08/06/2012   CTI ablation by Dr [name]  . ATRIAL FLUTTER ABLATION N/A 08/06/2012   Procedure: ATRIAL FLUTTER ABLATION;  Surgeon: Deboraha Sprang, MD;  Location: [facility] CATH LAB;  Service: Cardiovascular;  Laterality: N/A;  . CARDIAC CATHETERIZATION    . CARDIOVERSION N/A 07/15/2012   Procedure: CARDIOVERSION;  Surgeon: Thayer Headings, MD;  Location: [facility];  Service: Cardiovascular;  Laterality: N/A;  . CORONARY ARTERY BYPASS GRAFT  2009   x 5  . IMPLANTABLE CARDIOVERTER DEFIBRILLATOR IMPLANT N/A 11/10/2013   Procedure: SUB Q IMPLANTABLE CARDIOVERTER DEFIBRILLATOR IMPLANT;  Surgeon: Deboraha Sprang, MD;  Location: [facility] CATH LAB;  Service: Cardiovascular;  Laterality: N/A;  . LEFT HEART CATHETERIZATION WITH CORONARY ANGIOGRAM N/A 08/01/2013   Procedure: LEFT HEART CATHETERIZATION WITH CORONARY ANGIOGRAM;  Surgeon: Burnell Blanks, MD;  Location: [facility] CATH LAB;  Service: Cardiovascular;  Laterality: N/A;  . POCKET REVISION N/A 12/16/2013   Procedure: POCKET REVISION;  Surgeon: Evans Lance, MD;  Location: [facility] CATH LAB;  Service: Cardiovascular;  Laterality: N/A;  . SHOULDER SURGERY    . TEE WITHOUT CARDIOVERSION N/A 07/15/2012   Procedure: TRANSESOPHAGEAL ECHOCARDIOGRAM (TEE);  Surgeon: Thayer Headings, MD;  Location: [facility] ENDOSCOPY;  Service: Cardiovascular;  Laterality: N/A;  . TONSILLECTOMY      Family History  Problem Relation Age of Onset  . Alzheimer's disease Mother   . Heart failure Father 50  . Hypertension Father   . Hyperlipidemia Father   . Cancer Father     lung- took half of a young- smoker  . Heart disease Father     MI at 93  . Leukemia Brother   . Drug abuse Daughter     heroine  . Early death Neg Hx     . Kidney disease Neg Hx   . Stroke Neg Hx     Social History   Social History  . Marital status: Married    Spouse name: N/A  . Number of children: N/A  . Years of education: N/A   Occupational History  . Not on file.   Social History Main Topics  . Smoking status: Former Smoker    Packs/day: 1.00    Years: 40.00    Types: Cigarettes    Quit date: 02/01/2011  . Smokeless tobacco: Never Used  . Alcohol use 14.4 oz/week    24 Glasses of wine per week     Comment: drinks 2 bottles of wine per week  . Drug use: No  . Sexual activity: Yes    Birth control/ protection: Coitus interruptus   Other Topics Concern  . 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   He has been married four times previously, the first  For 45yr and has two children by the marriage which are grown, the second time for 11 yrs and has 2 by that marriage.   Wife has custody in [place]   The third marriage was 4 1/2 yrs and most recent marriage was the past eight weeks and he is currently estranged from that person.    Outpatient Medications Prior to Visit  Medication Sig Dispense Refill  . albuterol (PROVENTIL HFA;VENTOLIN HFA) 108 (90 BASE) MCG/ACT inhaler Inhale 2 puffs into the lungs every 6 (six) hours as needed for wheezing or shortness of breath. 1 Inhaler 2  . atorvastatin (LIPITOR) 20 MG tablet TAKE 1 TABLET (20 MG TOTAL) BY MOUTH DAILY. 90 tablet 1  . busPIRone (BUSPAR) 10 MG tablet TAKE 1 TABLET BY MOUTH TWICE A DAY 180 tablet 1  . carvedilol (COREG) 12.5 MG tablet TAKE 1 TABLET BY MOUTH TWICE DAILY WITH A MEAL 60 tablet 10  . clonazePAM (KLONOPIN) 0.5 MG tablet TAKE 1 TABLET BY MOUTH 3 TIMES A DAY AS NEEDED 60 tablet 0  . furosemide (LASIX) 40 MG tablet TAKE 1 TABLET BY MOUTH DAILY. IF WEIGHT INCREASED > 3LBS OVERNIGHT INCREASE BY ONE TABLET 45 tablet 10   . glipiZIDE (GLUCOTROL) 5 MG tablet TAKE 1/2 TABLET (2.5 MG TOTAL) BY MOUTH ONCE A DAY BEFORE BREAKFAST 15 tablet 12  . irbesartan (AVAPRO) 150 MG tablet TAKE 1 TABLET (150 MG TOTAL) BY MOUTH DAILY. 30 tablet 11  . metFORMIN (GLUCOPHAGE) 500 MG tablet Take 500 mg by mouth daily with breakfast.    . metFORMIN (GLUCOPHAGE) 500 MG tablet TAKE 1 TABLET BY MOUTH TWICE DAILY WITH A MEAL 60 tablet 0  . OVER THE COUNTER MEDICATION Place 1 drop into both eyes as needed (for dry eyes). "OTC Good  Sense Eye Drops"    . XARELTO 20 MG TABS tablet TAKE 1 TABLET BY MOUTH EVERY DAY WITH SUPPER 30 tablet 11   No facility-administered medications prior to visit.     No Known Allergies  Review of Systems  Constitutional: Negative for fatigue and malaise/fatigue.  Eyes: Negative for blurred vision.  Respiratory: Negative for shortness of breath.   Cardiovascular: Negative for palpitations and leg swelling.  Genitourinary: Negative for flank pain.  Musculoskeletal: Negative for back pain.  Neurological: Negative for loss of consciousness and weakness.       Objective:    Physical Exam  Constitutional: He is oriented to person, place, and time. He appears well-developed and well-nourished. No distress.  HENT:  Head: Normocephalic and atraumatic.  Eyes: Conjunctivae are normal.  Neck: Normal range of motion. No thyromegaly present.  Cardiovascular: Normal rate and regular rhythm.   Pulmonary/Chest: Effort normal and breath sounds normal. He has no wheezes.  Abdominal: Soft. Bowel sounds are normal. There is no tenderness.  Musculoskeletal: Normal range of motion. He exhibits no edema or deformity.  Neurological: He is alert and oriented to person, place, and time.  Skin: Skin is warm and dry. He is not diaphoretic.  Psychiatric: He has a normal mood and affect.    BP 128/80 (BP Location: Left Arm, Patient Position: Sitting, Cuff Size: Normal)   Pulse 60   Temp 98.1 F (36.7 C) (Oral)   Resp 18    Wt 199 lb 3.2 oz (90.4 kg)   SpO2 98%   BMI 25.58 kg/m  Wt Readings from Last 3 Encounters:  03/14/16 199 lb 3.2 oz (90.4 kg)  12/18/15 199 lb 12.8 oz (90.6 kg)  11/09/15 201 lb (91.2 kg)     Lab Results  Component Value Date   WBC 7.5 10/01/2015   HGB 15.5 10/01/2015   HCT 45.2 10/01/2015   PLT 159 10/01/2015   GLUCOSE 118 (H) 12/11/2015   CHOL 137 12/11/2015   TRIG 132.0 12/11/2015   HDL 48.00 12/11/2015   LDLDIRECT 62.0 12/05/2014   LDLCALC 62 12/11/2015   ALT 19 12/11/2015   AST 24 12/11/2015   NA 140 12/11/2015   K 4.2 12/11/2015   CL 103 12/11/2015   CREATININE 1.05 12/11/2015   BUN 14 12/11/2015   CO2 30 12/11/2015   TSH 0.62 07/03/2015   PSA 1.86 04/16/2012   INR 2.88 (H) 02/14/2014   HGBA1C 5.1 12/11/2015   MICROALBUR 4.5 (H) 07/03/2015    Lab Results  Component Value Date   TSH 0.62 07/03/2015   Lab Results  Component Value Date   WBC 7.5 10/01/2015   HGB 15.5 10/01/2015   HCT 45.2 10/01/2015   MCV 89.3 10/01/2015   PLT 159 10/01/2015   Lab Results  Component Value Date   NA 140 12/11/2015   K 4.2 12/11/2015   CO2 30 12/11/2015   GLUCOSE 118 (H) 12/11/2015   BUN 14 12/11/2015   CREATININE 1.05 12/11/2015   BILITOT 1.4 (H) 12/11/2015   ALKPHOS 71 12/11/2015   AST 24 12/11/2015   ALT 19 12/11/2015   PROT 7.0 12/11/2015   ALBUMIN 4.1 12/11/2015   CALCIUM 10.0 12/11/2015   ANIONGAP 9 02/14/2014   GFR 74.75 12/11/2015   Lab Results  Component Value Date   CHOL 137 12/11/2015   Lab Results  Component Value Date   HDL 48.00 12/11/2015   Lab Results  Component Value Date   LDLCALC 62 12/11/2015   Lab Results  Component Value Date   TRIG 132.0 12/11/2015   Lab Results  Component Value Date   CHOLHDL 3 12/11/2015   Lab Results  Component Value Date   HGBA1C 5.1 12/11/2015       Assessment & Plan:   Problem List Items Addressed This Visit    Hyperlipidemia    Tolerating statin, encouraged heart healthy diet, avoid  trans fats, minimize simple carbs and saturated fats. Increase exercise as tolerated      Relevant Orders   Lipid panel   Essential hypertension    Well controlled, no changes to meds. Encouraged heart healthy diet such as the DASH diet and exercise as tolerated.       Relevant Orders   CBC   Comprehensive metabolic panel   TSH   Diabetes mellitus type 2 in obese (HCC)    hgba1c acceptable, minimize simple carbs. Increase exercise as tolerated. Continue current meds      Relevant Orders   Hemoglobin A1c   CHF (congestive heart failure) (HCC)    No recent flares, minimize sodium       CAD (coronary artery disease)    Doing great works on mindfullness and stress reduction. No recent concerns.          I am having Mr. Esquivel maintain his OVER THE COUNTER MEDICATION, albuterol, glipiZIDE, metFORMIN, irbesartan, XARELTO, atorvastatin, furosemide, carvedilol, metFORMIN, clonazePAM, and busPIRone.  No orders of the defined types were placed in this encounter.   CMA served as Education administrator during this visit. History, Physical and Plan performed by medical provider. Documentation and orders reviewed and attested to.  Penni Homans, MD

## 2016-03-15 ENCOUNTER — Other Ambulatory Visit: Payer: Self-pay | Admitting: Family Medicine

## 2016-04-09 ENCOUNTER — Other Ambulatory Visit: Payer: Self-pay | Admitting: Family Medicine

## 2016-04-09 NOTE — Telephone Encounter (Signed)
Requesting:   clonazepam Contract    02/23/2014 UDS    Low risk done on 08/24/2014 Last OV     03/14/2016----next scheduled appt is on 12/22/2016 Last Refill   #60 on 02/08/2016  Please Advise

## 2016-04-10 NOTE — Telephone Encounter (Signed)
F[facility]ed [facility]

## 2016-04-15 ENCOUNTER — Other Ambulatory Visit: Payer: Self-pay | Admitting: Family Medicine

## 2016-05-14 ENCOUNTER — Other Ambulatory Visit: Payer: Self-pay | Admitting: Family Medicine

## 2016-05-15 MED ORDER — CLONAZEPAM 0.5 MG PO TABS: 0.5000 mg | ORAL_TABLET | Freq: Three times a day (TID) | ORAL | 0 refills | Status: DC | PRN

## 2016-05-15 NOTE — Telephone Encounter (Signed)
Requesting:   clonaepam Contract    02/23/2014 UDS    Low risk due Last OV    03/14/2016----future 12/22/2016 Last Refill  #60 on 04/09/2016  Please Advise

## 2016-05-15 NOTE — Telephone Encounter (Signed)
[facility] [address]

## 2016-05-15 NOTE — Addendum Note (Signed)
Addended by: EWING, ROBIN B on: 05/15/2016 01:27 PM   Modules accepted: Orders  

## 2016-05-16 ENCOUNTER — Telehealth: Payer: Self-pay | Admitting: Family Medicine

## 2016-05-16 NOTE — Telephone Encounter (Signed)
Called patient to schedule awv. Lvm for patient to call office to schedule appt.  °

## 2016-05-16 NOTE — Telephone Encounter (Signed)
Patient called office regarding awv. Patient stated that he would like to schedule awv next year.

## 2016-05-18 ENCOUNTER — Other Ambulatory Visit: Payer: Self-pay | Admitting: Family Medicine

## 2016-05-20 ENCOUNTER — Telehealth: Payer: Self-pay | Admitting: Family Medicine

## 2016-05-20 ENCOUNTER — Other Ambulatory Visit: Payer: Self-pay | Admitting: Family Medicine

## 2016-05-20 NOTE — Telephone Encounter (Signed)
Refill was sent on 05/15/16   PC

## 2016-05-20 NOTE — Telephone Encounter (Signed)
Spoke with patient to schedule AWV. Patient asked for a  refill on clonazepam. Informed patient that message would be sent to nurse for a return call.

## 2016-05-26 ENCOUNTER — Ambulatory Visit (INDEPENDENT_AMBULATORY_CARE_PROVIDER_SITE_OTHER): Payer: Medicare HMO | Admitting: *Deleted

## 2016-05-26 DIAGNOSIS — I255 Ischemic cardiomyopathy: Secondary | ICD-10-CM | POA: Diagnosis not present

## 2016-05-26 NOTE — Progress Notes (Signed)
Remote ICD transmission.   

## 2016-05-27 ENCOUNTER — Encounter: Payer: Self-pay | Admitting: Cardiology

## 2016-05-27 LAB — CUP PACEART REMOTE DEVICE CHECK
Battery Remaining Percentage: 71 %
Implantable Lead Implant Date: 20151029
Implantable Pulse Generator Implant Date: 20151029
MDC IDC LEAD LOCATION: 753862
MDC IDC SESS DTM: 20180514132900
Pulse Gen Serial Number: 108164

## 2016-05-27 NOTE — Progress Notes (Signed)
Subjective:   Larry Rangel is a 66 y.o. male who presents for Medicare Annual/Subsequent preventive examination.  Review of Systems:  No ROS.  Medicare Wellness Visit. Cardiac Risk Factors include: advanced age (>42mn, >>72women);diabetes mellitus;dyslipidemia;hypertension;male gender Sleep patterns: Sleeps 7-8 hrs. Feels rested. Home Safety/Smoke Alarms:  Feels safe in home. Smoke alarms in place.  Living environment; residence and Firearm Safety: Lives with wife. 2 stories. Guns safely stored  Seat Belt Safety/Bike Helmet: Wears seat belt.   Counseling:   Eye Exam- Pt states he will schedule eye appt.  Dental- Dentist annually.  Male:   CCS- last reported 01/13/06-normal   Cologuard  Ordered. PSA-  Lab Results  Component Value Date   PSA 1.86 04/16/2012   PSA 0.85 08/09/2010       Objective:    Vitals: BP 138/88 (BP Location: Right Arm, Patient Position: Sitting, Cuff Size: Normal)   Pulse 77   Ht _0  (1.88 m)   Wt 198 lb 6.4 oz (90 kg)   SpO2 99%   BMI 25.47 kg/m   Body mass index is 25.47 kg/m.  Tobacco History  Smoking Status  . Former Smoker  . Packs/day: 1.00  . Years: 40.00  . Types: Cigarettes  . Quit date: 02/01/2011  Smokeless Tobacco  . Never Used     Counseling given: No   Past Medical History:  Diagnosis Date  . Abnormal liver function 08/23/2010  . Anxiety   . Arthritis   . CAD (coronary artery disease)    a. s/p CABG 2009. b. Cath 07/2013: 3/5 patent grafts (appear to have chronic occ grafts), mod diag/L PL branch, did not appear flow limiting, elevated filling pressures.  . CHF (congestive heart failure) ([place] 07/30/2013  . Chicken pox as a child  . Chronic combined systolic and diastolic CHF (congestive heart failure) ([facility]    a. Echo 6/14: Mild LVH, EF 30-35%, diffuse HK, MAC, mild BAE. b. Drop in EF to 20% by echo 07/2013.  . Diabetes mellitus ([place]   . Hyperlipidemia   . Hypertension   . LBBB (left bundle branch block)   .  Low back pain   . Measles as a child  . Mumps as a child  . Myocardial infarction ([facility]   . PAF (paroxysmal atrial fibrillation) ([facility]   . Paroxysmal atrial flutter ([facility] 07-2012   a. s/p ablation by Dr KCaryl Comes7-25-2014. b. Recurrence 07/2013.   .Marland KitchenPreventative health care 12/10/2014  . S/P colonoscopy   . Tobacco user    Past Surgical History:  Procedure Laterality Date  . ABLATION OF DYSRHYTHMIC FOCUS  08/06/2012   CTI ablation by Dr KCaryl Comes . ATRIAL FLUTTER ABLATION N/A 08/06/2012   Procedure: ATRIAL FLUTTER ABLATION;  Surgeon: SDeboraha Sprang MD;  [facility];  Service: Cardiovascular;  Laterality: N/A;  . CARDIAC CATHETERIZATION    . CARDIOVERSION N/A 07/15/2012   Procedure: CARDIOVERSION;  Surgeon: PThayer Headings MD;  [place] [facility]  Service: Cardiovascular;  Laterality: N/A;  . CORONARY ARTERY BYPASS GRAFT  2009   x 5  . IMPLANTABLE CARDIOVERTER DEFIBRILLATOR IMPLANT N/A 11/10/2013   Procedure: SUB Q IMPLANTABLE CARDIOVERTER DEFIBRILLATOR IMPLANT;  Surgeon: SDeboraha Sprang MD;  [facility];  Service: Cardiovascular;  Laterality: N/A;  . LEFT HEART CATHETERIZATION WITH CORONARY ANGIOGRAM N/A 08/01/2013   Procedure: LEFT HEART CATHETERIZATION WITH CORONARY ANGIOGRAM;  Surgeon: CBurnell Blanks MD;  [facility];  Service: Cardiovascular;  Laterality:  N/A;  . POCKET REVISION N/A 12/16/2013   Procedure: POCKET REVISION;  Surgeon: Evans Lance, MD;  [facility];  Service: Cardiovascular;  Laterality: N/A;  . SHOULDER SURGERY    . TEE WITHOUT CARDIOVERSION N/A 07/15/2012   Procedure: TRANSESOPHAGEAL ECHOCARDIOGRAM (TEE);  Surgeon: Thayer Headings, MD;  [facility] ENDOSCOPY;  Service: Cardiovascular;  Laterality: N/A;  . TONSILLECTOMY     Family History  Problem Relation Age of Onset  . Alzheimer's disease Mother   . Heart failure Father 44  . Hypertension Father   . Hyperlipidemia Father   . Cancer Father        lung- took half  of a young- smoker  . Heart disease Father        MI at 41  . Leukemia Brother   . Drug abuse Daughter        heroine  . Early death Neg Hx   . Kidney disease Neg Hx   . Stroke Neg Hx    History  Sexual Activity  . Sexual activity: Yes  . Birth control/ protection: Coitus interruptus    Outpatient Encounter Prescriptions as of 05/29/2016  Medication Sig  . atorvastatin (LIPITOR) 20 MG tablet TAKE 1 TABLET (20 MG TOTAL) BY MOUTH DAILY.  . busPIRone (BUSPAR) 10 MG tablet TAKE 1 TABLET BY MOUTH TWICE A DAY  . carvedilol (COREG) 12.5 MG tablet TAKE 1 TABLET BY MOUTH TWICE DAILY WITH A MEAL  . clonazePAM (KLONOPIN) 0.5 MG tablet Take 1 tablet (0.5 mg total) by mouth 3 (three) times daily as needed.  . furosemide (LASIX) 40 MG tablet TAKE 1 TABLET BY MOUTH DAILY. IF WEIGHT INCREASED > 3LBS OVERNIGHT INCREASE BY ONE TABLET  . glipiZIDE (GLUCOTROL) 5 MG tablet TAKE 1/2 TABLET (2.5 MG TOTAL) BY MOUTH ONCE A DAY BEFORE BREAKFAST  . irbesartan (AVAPRO) 150 MG tablet TAKE 1 TABLET (150 MG TOTAL) BY MOUTH DAILY.  . metFORMIN (GLUCOPHAGE) 500 MG tablet TAKE 1 TABLET BY MOUTH TWICE DAILY WITH A MEAL  . OVER THE COUNTER MEDICATION Place 1 drop into both eyes as needed (for dry eyes). "OTC Good Sense Eye Drops"  . XARELTO 20 MG TABS tablet TAKE 1 TABLET BY MOUTH EVERY DAY WITH SUPPER  . albuterol (PROVENTIL HFA;VENTOLIN HFA) 108 (90 BASE) MCG/ACT inhaler Inhale 2 puffs into the lungs every 6 (six) hours as needed for wheezing or shortness of breath. (Patient not taking: Reported on 05/29/2016)   No facility-administered encounter medications on file as of 05/29/2016.     Activities of Daily Living In your present state of health, do you have any difficulty performing the following activities: 05/29/2016  Hearing? N  Vision? N  Difficulty concentrating or making decisions? N  Walking or climbing stairs? N  Dressing or bathing? N  Doing errands, shopping? N  Preparing Food and eating ? N  Using  the Toilet? N  In the past six months, have you accidently leaked urine? N  Do you have problems with loss of bowel control? N  Managing your Medications? N  Managing your Finances? N  Housekeeping or managing your Housekeeping? N  Some recent data might be hidden    Patient Care Team: Mosie Lukes, MD as PCP - General (Family Medicine) Deboraha Sprang, MD as Consulting Physician (Cardiology)   Assessment:    Physical assessment deferred to PCP.  Exercise Activities and Dietary recommendations Current Exercise Habits: Structured exercise class, Time (Minutes): 60, Frequency (Times/Week): 7, Weekly Exercise (  Minutes/Week): 420, Intensity: Moderate   Diet (meal preparation, eat out, water intake, caffeinated beverages, dairy products, fruits and vegetables): in general, a "healthy" diet  . Pt states he drinks plenty of water.     Goals      Patient Stated   . Complete Eye Exam by the end of next year.   (pt-stated)      Fall Risk Fall Risk  05/29/2016 12/18/2015 11/24/2014 06/12/2013 06/07/2013  Falls in the past year? _0    Depression Screen PHQ 2/9 Scores 05/29/2016 12/18/2015 11/24/2014 06/12/2013  PHQ - 2 Score 0 0 0 0    Cognitive Function MMSE - Mini Mental State Exam 11/24/2014  Orientation to time 5  Orientation to Place 5  Registration 3  Attention/ Calculation 5  Recall 2  Language- name 2 objects 2  Language- repeat 1  Language- follow 3 step command 3  Language- read & follow direction 1  Write a sentence 1  Copy design 1  Total score 29        Immunization History  Administered Date(s) Administered  . Td 01/13/2009   Screening Tests Health Maintenance  Topic Date Due  . FOOT EXAM  11/12/2013  . OPHTHALMOLOGY EXAM  12020-06-815  . COLONOSCOPY  01/14/2016  . PNA vac Low Risk Adult (1 of 2 - PCV13) 12/17/2016 (Originally 05/09/2013)  . HEMOGLOBIN A1C  06/09/2016  . INFLUENZA VACCINE  08/13/2016  . TETANUS/TDAP  01/14/2019  . Hepatitis C  Screening  Completed      Plan:   Follow up with Dr.Blyth as scheduled  Continue to eat heart healthy diet (full of fruits, vegetables, whole grains, lean protein, water--limit salt, fat, and sugar intake) and increase physical activity as tolerated.  Your cologuard (screening for colon cancer) has been ordered today and will be mailed to your home.  I have personally reviewed and noted the following in the patient's chart:   . Medical and social history . Use of alcohol, tobacco or illicit drugs  . Current medications and supplements . Functional ability and status . Nutritional status . Physical activity . Advanced directives . List of other physicians . Hospitalizations, surgeries, and ER visits in previous 12 months . Vitals . Screenings to include cognitive, depression, and falls . Referrals and appointments  In addition, I have reviewed and discussed with patient certain preventive protocols, quality metrics, and best practice recommendations. A written personalized care plan for preventive services as well as general preventive health recommendations were provided to patient.     Shela Nevin, [place]  05/29/2016

## 2016-05-29 ENCOUNTER — Ambulatory Visit (INDEPENDENT_AMBULATORY_CARE_PROVIDER_SITE_OTHER): Payer: Medicare HMO | Admitting: *Deleted

## 2016-05-29 ENCOUNTER — Encounter: Payer: Self-pay | Admitting: *Deleted

## 2016-05-29 VITALS — BP 138/88 | HR 77 | Ht 74.0 in | Wt 198.4 lb

## 2016-05-29 DIAGNOSIS — Z0000 Encounter for general adult medical examination without abnormal findings: Secondary | ICD-10-CM

## 2016-05-29 NOTE — Patient Instructions (Signed)
  [name] , Thank you for taking time to come for your Medicare Wellness Visit. I appreciate your ongoing commitment to your health goals. Please review the following plan we discussed and let me know if I can assist you in the future.   These are the goals we discussed: Goals      Patient Stated   . Complete Eye Exam by the end of next year.   (pt-stated)       This is a list of the screening recommended for you and due dates:  Health Maintenance  Topic Date Due  . Complete foot exam   11/12/2013  . Eye exam for diabetics  [id number]  . Colon Cancer Screening  01/14/2016  . Pneumonia vaccines (1 of 2 - PCV13) 12/17/2016*  . Hemoglobin A1C  06/09/2016  . Flu Shot  08/13/2016  . Tetanus Vaccine  01/14/2019  .  Hepatitis C: One time screening is recommended by Center for Disease Control  (CDC) for  adults born from [birth year] through 1967.   Completed  *Topic was postponed. The date shown is not the original due date.     Follow up with Dr.Blyth as scheduled  Continue to eat heart healthy diet (full of fruits, vegetables, whole grains, lean protein, water--limit salt, fat, and sugar intake) and increase physical activity as tolerated.  Your cologuard (screening for colon cancer) has been ordered today and will be mailed to your home.  Schedule your eye exam please!  KEEP UP THE GREAT WORK!!!!

## 2016-06-10 ENCOUNTER — Encounter: Payer: Self-pay | Admitting: Cardiology

## 2016-07-08 ENCOUNTER — Other Ambulatory Visit: Payer: Self-pay | Admitting: Family Medicine

## 2016-07-10 NOTE — Telephone Encounter (Signed)
Rx printed, awaiting MD signature.  

## 2016-07-10 NOTE — Telephone Encounter (Signed)
Pt is requesting refill on clonazepam 0.5mg .  Last OV: 05/29/2016 Last Fill: 05/15/2016 #60 and 0RF Pt sig: 1 tab tid prn UDS: 02/23/2014 Low risk  Please advise.

## 2016-07-10 NOTE — Telephone Encounter (Signed)
Can have refill but needs contract and uds

## 2016-07-10 NOTE — Telephone Encounter (Signed)
Pt informed via MyCHart that Rx has been placed at front desk (must update contract and UDS).

## 2016-07-30 ENCOUNTER — Encounter (INDEPENDENT_AMBULATORY_CARE_PROVIDER_SITE_OTHER): Payer: Self-pay

## 2016-07-30 ENCOUNTER — Ambulatory Visit (INDEPENDENT_AMBULATORY_CARE_PROVIDER_SITE_OTHER): Payer: Medicare HMO | Admitting: Internal Medicine

## 2016-07-30 ENCOUNTER — Encounter: Payer: Self-pay | Admitting: Internal Medicine

## 2016-07-30 VITALS — BP 130/80 | HR 56 | Ht 74.0 in | Wt 192.0 lb

## 2016-07-30 DIAGNOSIS — I480 Paroxysmal atrial fibrillation: Secondary | ICD-10-CM | POA: Diagnosis not present

## 2016-07-30 DIAGNOSIS — I255 Ischemic cardiomyopathy: Secondary | ICD-10-CM | POA: Diagnosis not present

## 2016-07-30 DIAGNOSIS — Z95810 Presence of automatic (implantable) cardiac defibrillator: Secondary | ICD-10-CM | POA: Diagnosis not present

## 2016-07-30 LAB — CBC WITH DIFFERENTIAL/PLATELET
BASOS ABS: 0 10*3/uL (ref 0.0–0.2)
Basos: 1 %
EOS (ABSOLUTE): 0.3 10*3/uL (ref 0.0–0.4)
Eos: 3 %
Hematocrit: 46.6 % (ref 37.5–51.0)
Hemoglobin: 16.2 g/dL (ref 13.0–17.7)
Immature Grans (Abs): 0 10*3/uL (ref 0.0–0.1)
Immature Granulocytes: 0 %
LYMPHS ABS: 1.7 10*3/uL (ref 0.7–3.1)
LYMPHS: 20 %
MCH: 30.9 pg (ref 26.6–33.0)
MCHC: 34.8 g/dL (ref 31.5–35.7)
MCV: 89 fL (ref 79–97)
Monocytes Absolute: 0.7 10*3/uL (ref 0.1–0.9)
Monocytes: 9 %
NEUTROS ABS: 5.6 10*3/uL (ref 1.4–7.0)
Neutrophils: 67 %
PLATELETS: 161 10*3/uL (ref 150–379)
RBC: 5.24 x10E6/uL (ref 4.14–5.80)
RDW: 13.7 % (ref 12.3–15.4)
WBC: 8.4 10*3/uL (ref 3.4–10.8)

## 2016-07-30 LAB — BASIC METABOLIC PANEL
BUN / CREAT RATIO: 15 (ref 10–24)
BUN: 16 mg/dL (ref 8–27)
CALCIUM: 9.8 mg/dL (ref 8.6–10.2)
CHLORIDE: 100 mmol/L (ref 96–106)
CO2: 23 mmol/L (ref 20–29)
Creatinine, Ser: 1.05 mg/dL (ref 0.76–1.27)
GFR calc non Af Amer: 73 mL/min/{1.73_m2} (ref >59)
GFR, EST AFRICAN AMERICAN: 84 mL/min/{1.73_m2} (ref >59)
GLUCOSE: 103 mg/dL — AB (ref 65–99)
Potassium: 4.1 mmol/L (ref 3.5–5.2)
Sodium: 140 mmol/L (ref 134–144)

## 2016-07-30 LAB — CUP PACEART INCLINIC DEVICE CHECK
Date Time Interrogation Session: 20180718101603
Implantable Lead Implant Date: 20151029
Implantable Lead Location: 753862
Implantable Lead Model: 3401
MDC IDC PG IMPLANT DT: 20151029
MDC IDC PG SERIAL: 108164

## 2016-07-30 NOTE — Patient Instructions (Signed)
Medication Instructions: - Your physician recommends that you continue on your current medications as directed. Please refer to the Current Medication list given to you today.  Labwork: - Your physician recommends that you have lab work today: BMP/ CBC  Procedures/Testing: - none ordered  Follow-Up: - Remote monitoring is used to monitor your Pacemaker of ICD from home. This monitoring reduces the number of office visits required to check your device to one time per year. It allows [place] to keep an eye on the functioning of your device to ensure it is working properly. You are scheduled for a device check from home on 08/25/16. You may send your transmission at any time that day. If you have a wireless device, the transmission will be sent automatically. After your physician reviews your transmission, you will receive a postcard with your next transmission date.  - Your physician wants you to follow-up in: 1 year with Dr. Graciela Husbands. You will receive a reminder letter in the mail two months in advance. If you don't receive a letter, please call our office to schedule the follow-up appointment.  Any Additional Special Instructions Will Be Listed Below (If Applicable).     If you need a refill on your cardiac medications before your next appointment, please call your pharmacy.

## 2016-07-30 NOTE — Progress Notes (Signed)
Patient Care Team: Mosie Lukes, MD as PCP - General (Family Medicine) Deboraha Sprang, MD as Consulting Physician (Cardiology)   HPI  Larry Rangel is a 66 y.o. male Seen in followup for SICD implanted 2015 for ICM with prior CABG. Hx of Flutter s/p ablation and  Afib  On Rivaroxaban   DATE TEST          9/15  Echo   EF 15 %   9/17 Echo  EF 25%     Date Cr Hgb  11/17 1.05 15.5 (9/17)        The patient denies chest pain, shortness of breath, nocturnal dyspnea, orthopnea or peripheral edema.  There have been no palpitations, lightheadedness or syncope.    No bleeding   11/17  LDL 64  Records and Results Reviewed   Past Medical History:  Diagnosis Date  . Abnormal liver function 08/23/2010  . Anxiety   . Arthritis   . CAD (coronary artery disease)    a. s/p CABG 2009. b. Cath 07/2013: 3/5 patent grafts (appear to have chronic occ grafts), mod diag/L PL branch, did not appear flow limiting, elevated filling pressures.  . CHF (congestive heart failure) ([place]) 07/30/2013  . Chicken pox as a child  . Chronic combined systolic and diastolic CHF (congestive heart failure) ([place])    a. Echo 6/14: Mild LVH, EF 30-35%, diffuse HK, MAC, mild BAE. b. Drop in EF to 20% by echo 07/2013.  . Diabetes mellitus ([place])   . Hyperlipidemia   . Hypertension   . LBBB (left bundle branch block)   . Low back pain   . Measles as a child  . Mumps as a child  . Myocardial infarction ([place])   . PAF (paroxysmal atrial fibrillation) ([place])   . Paroxysmal atrial flutter ([place]) 07-2012   a. s/p ablation by Dr [name] 08-06-2012. b. Recurrence 07/2013.   Marland Kitchen Preventative health care 12/10/2014  . S/P colonoscopy   . Tobacco user     Past Surgical History:  Procedure Laterality Date  . ABLATION OF DYSRHYTHMIC FOCUS  08/06/2012   CTI ablation by Dr [name]  . ATRIAL FLUTTER ABLATION N/A 08/06/2012   Procedure: ATRIAL FLUTTER ABLATION;  Surgeon: Deboraha Sprang, MD;  [facility]: [facility] CATH LAB;  Service:  Cardiovascular;  Laterality: N/A;  . CARDIAC CATHETERIZATION    . CARDIOVERSION N/A 07/15/2012   Procedure: CARDIOVERSION;  Surgeon: Thayer Headings, MD;  Location: [place];  Service: Cardiovascular;  Laterality: N/A;  . CORONARY ARTERY BYPASS GRAFT  2009   x 5  . IMPLANTABLE CARDIOVERTER DEFIBRILLATOR IMPLANT N/A 11/10/2013   Procedure: SUB Q IMPLANTABLE CARDIOVERTER DEFIBRILLATOR IMPLANT;  Surgeon: Deboraha Sprang, MD;  Location: [facility] CATH LAB;  Service: Cardiovascular;  Laterality: N/A;  . LEFT HEART CATHETERIZATION WITH CORONARY ANGIOGRAM N/A 08/01/2013   Procedure: LEFT HEART CATHETERIZATION WITH CORONARY ANGIOGRAM;  Surgeon: Burnell Blanks, MD;  Location: [facility] CATH LAB;  Service: Cardiovascular;  Laterality: N/A;  . POCKET REVISION N/A 12/16/2013   Procedure: POCKET REVISION;  Surgeon: Evans Lance, MD;  Location: [facility] CATH LAB;  Service: Cardiovascular;  Laterality: N/A;  . SHOULDER SURGERY    . TEE WITHOUT CARDIOVERSION N/A 07/15/2012   Procedure: TRANSESOPHAGEAL ECHOCARDIOGRAM (TEE);  Surgeon: Thayer Headings, MD;  Location: [place];  Service: Cardiovascular;  Laterality: N/A;  . TONSILLECTOMY      Current Outpatient Prescriptions  Medication Sig Dispense Refill  . albuterol (PROVENTIL HFA;VENTOLIN HFA)  108 (90 BASE) MCG/ACT inhaler Inhale 2 puffs into the lungs every 6 (six) hours as needed for wheezing or shortness of breath. 1 Inhaler 2  . atorvastatin (LIPITOR) 20 MG tablet TAKE 1 TABLET (20 MG TOTAL) BY MOUTH DAILY. 90 tablet 1  . busPIRone (BUSPAR) 10 MG tablet TAKE 1 TABLET BY MOUTH TWICE A DAY 180 tablet 1  . carvedilol (COREG) 12.5 MG tablet TAKE 1 TABLET BY MOUTH TWICE DAILY WITH A MEAL 60 tablet 10  . clonazePAM (KLONOPIN) 0.5 MG tablet Take 1 tablet (0.5 mg total) by mouth 3 (three) times daily as needed. 60 tablet 0  . furosemide (LASIX) 40 MG tablet TAKE 1 TABLET BY MOUTH DAILY. IF WEIGHT INCREASED > 3LBS OVERNIGHT INCREASE BY ONE TABLET 45 tablet 10  .  glipiZIDE (GLUCOTROL) 5 MG tablet TAKE 1/2 TABLET (2.5 MG TOTAL) BY MOUTH ONCE A DAY BEFORE BREAKFAST 15 tablet 12  . irbesartan (AVAPRO) 150 MG tablet TAKE 1 TABLET (150 MG TOTAL) BY MOUTH DAILY. 30 tablet 11  . metFORMIN (GLUCOPHAGE) 500 MG tablet TAKE 1 TABLET BY MOUTH TWICE DAILY WITH A MEAL 60 tablet 5  . OVER THE COUNTER MEDICATION Place 1 drop into both eyes as needed (for dry eyes). "OTC Good Sense Eye Drops"    . XARELTO 20 MG TABS tablet TAKE 1 TABLET BY MOUTH EVERY DAY WITH SUPPER 30 tablet 11   No current facility-administered medications for this visit.     No Known Allergies    Review of Systems negative except from HPI and PMH  Physical Exam BP 130/80   Pulse (!) 56   Ht _0  (1.88 m)   Wt 192 lb (87.1 kg)   SpO2 98%   BMI 24.65 kg/m  Well developed and well nourished in no acute distress HENT normal E scleral and icterus clear Neck Supple JVP flat; carotids brisk and full Clear to ausculation Device pocket well healed; without hematoma or erythema.  There is no tethering  Regular rate and rhythm, no murmurs gallops or rub Soft with active bowel sounds No clubbing cyanosis  Edema Alert and oriented, grossly normal motor and sensory function Skin Warm and Dry  ECG  NSR @ 54 19/12/46 LVH REPOL  Assessment and  Plan  Ischemic cardiomyopathy  Hyperlipidemia  Implantable defibrillator-[employer]  Atrial fibrillation  Without symptoms of ischemia  No intercurrent atrial fibrillation or flutter  On Anticoagulation;  No bleeding issues   Will check Hgb on anticoagulation  LDL at target   Recheck with PCP     Current medicines are reviewed at length with the patient today .  The patient does not  have concerns regarding medicines.

## 2016-08-04 ENCOUNTER — Telehealth: Payer: Self-pay | Admitting: Internal Medicine

## 2016-08-04 ENCOUNTER — Telehealth: Payer: Self-pay

## 2016-08-04 NOTE — Telephone Encounter (Signed)
Notes recorded by Duke Salvia, MD on 08/04/2016 at 8:53 AM EDT Please Inform Patient that labs are normal Thanks  Pt made aware of normal lab results per Dr Graciela Husbands.  Pt verbalized understanding.

## 2016-08-04 NOTE — Telephone Encounter (Signed)
lmtcb for lab results

## 2016-08-04 NOTE — Telephone Encounter (Signed)
New message  ° ° ° °Pt is returning call for lab results  °

## 2016-08-05 NOTE — Telephone Encounter (Signed)
Noted  

## 2016-08-19 ENCOUNTER — Other Ambulatory Visit: Payer: Self-pay | Admitting: Family Medicine

## 2016-08-25 ENCOUNTER — Ambulatory Visit (INDEPENDENT_AMBULATORY_CARE_PROVIDER_SITE_OTHER): Payer: Medicare HMO | Admitting: *Deleted

## 2016-08-25 DIAGNOSIS — I255 Ischemic cardiomyopathy: Secondary | ICD-10-CM

## 2016-08-25 DIAGNOSIS — Z95810 Presence of automatic (implantable) cardiac defibrillator: Secondary | ICD-10-CM

## 2016-08-26 LAB — CUP PACEART REMOTE DEVICE CHECK
Date Time Interrogation Session: 20180813212500
Implantable Lead Location: 753862
Implantable Pulse Generator Implant Date: 20151029
MDC IDC LEAD IMPLANT DT: 20151029
MDC IDC MSMT BATTERY REMAINING PERCENTAGE: 68 %
Pulse Gen Serial Number: 108164

## 2016-08-26 NOTE — Progress Notes (Signed)
Remote defibrillator check.  

## 2016-09-01 ENCOUNTER — Other Ambulatory Visit: Payer: Self-pay | Admitting: Internal Medicine

## 2016-09-04 ENCOUNTER — Encounter: Payer: Self-pay | Admitting: Cardiology

## 2016-09-13 ENCOUNTER — Other Ambulatory Visit: Payer: Self-pay | Admitting: Family Medicine

## 2016-09-26 ENCOUNTER — Other Ambulatory Visit: Payer: Self-pay | Admitting: *Deleted

## 2016-09-26 MED ORDER — RIVAROXABAN 20 MG PO TABS: ORAL_TABLET | ORAL | 1 refills | Status: DC

## 2016-10-11 ENCOUNTER — Other Ambulatory Visit: Payer: Self-pay | Admitting: Family Medicine

## 2016-10-16 ENCOUNTER — Other Ambulatory Visit: Payer: Self-pay | Admitting: Family Medicine

## 2016-10-17 ENCOUNTER — Other Ambulatory Visit: Payer: Self-pay | Admitting: Family Medicine

## 2016-10-17 ENCOUNTER — Other Ambulatory Visit: Payer: Self-pay | Admitting: Emergency Medicine

## 2016-10-17 MED ORDER — METFORMIN HCL 500 MG PO TABS: ORAL_TABLET | ORAL | 0 refills | Status: DC

## 2016-10-17 NOTE — Telephone Encounter (Signed)
This was already faxed in today/thx dmf 

## 2016-10-28 ENCOUNTER — Telehealth: Payer: Self-pay | Admitting: Internal Medicine

## 2016-10-28 NOTE — Telephone Encounter (Signed)
I spoke with patient's wife--recent refill was more than $400, she thinks because he is in the donut hole, pt has been given toll free number, [phone],  to call to see if he qualifies for assistance with Xarelto, he has been given 2 weeks  samples and instructed to take Xarelto 20 mg daily in the evening with supper, instructed to call the office if he is unable to continue Xarelto and he can  switch to warfarin, she verbalized understanding.

## 2016-10-28 NOTE — Telephone Encounter (Signed)
Pt is likely in the donut hole. He can try calling for patient assistance at [phone] to see if he qualifies for extra assistance with Xarelto while in the donut hole. Otherwise, would be ok to switch to warfarin 5mg  daily. Would recommend 3 day overlap with Xarelto and warfarin and new Coumadin appt after pt has had 5 doses of warfarin.

## 2016-10-28 NOTE — Telephone Encounter (Signed)
New message   Pt wife verbalized that she is calling because she want -pt to try coumadin because his medication is so expensive

## 2016-11-12 ENCOUNTER — Other Ambulatory Visit: Payer: Self-pay | Admitting: Family Medicine

## 2016-11-24 ENCOUNTER — Ambulatory Visit (INDEPENDENT_AMBULATORY_CARE_PROVIDER_SITE_OTHER): Payer: Medicare HMO | Admitting: *Deleted

## 2016-11-24 DIAGNOSIS — I255 Ischemic cardiomyopathy: Secondary | ICD-10-CM | POA: Diagnosis not present

## 2016-11-24 NOTE — Progress Notes (Signed)
Remote ICD transmission.   

## 2016-11-26 LAB — CUP PACEART REMOTE DEVICE CHECK
Battery Remaining Percentage: 66 %
Implantable Pulse Generator Implant Date: 20151029
MDC IDC LEAD IMPLANT DT: 20151029
MDC IDC LEAD LOCATION: 753862
MDC IDC SESS DTM: 20181112154600
Pulse Gen Serial Number: 108164

## 2016-11-28 ENCOUNTER — Encounter: Payer: Self-pay | Admitting: Cardiology

## 2016-12-04 ENCOUNTER — Other Ambulatory Visit: Payer: Self-pay | Admitting: Internal Medicine

## 2016-12-05 ENCOUNTER — Other Ambulatory Visit: Payer: Self-pay | Admitting: Internal Medicine

## 2016-12-09 ENCOUNTER — Other Ambulatory Visit: Payer: Self-pay

## 2016-12-09 MED ORDER — METFORMIN HCL 500 MG PO TABS: ORAL_TABLET | ORAL | 0 refills | Status: DC

## 2016-12-15 ENCOUNTER — Other Ambulatory Visit: Payer: Medicare HMO

## 2016-12-22 ENCOUNTER — Encounter: Payer: Commercial Managed Care - HMO | Admitting: Family Medicine

## 2016-12-30 ENCOUNTER — Encounter: Payer: Medicare HMO | Admitting: Family Medicine

## 2017-01-07 ENCOUNTER — Telehealth: Payer: Self-pay | Admitting: Family Medicine

## 2017-01-07 MED ORDER — ATORVASTATIN CALCIUM 20 MG PO TABS: ORAL_TABLET | ORAL | 0 refills | Status: DC

## 2017-01-07 NOTE — Telephone Encounter (Signed)
Copied from CRM [id number]. Topic: Quick Communication - See Telephone Encounter >> Jan 07, 2017  3:01 PM Arlyss Gandy, NT wrote: CRM for notification. See Telephone encounter for: [facility] [address] pharmacy calling to check on refill of Lipitor.   01/07/17.

## 2017-01-07 NOTE — Telephone Encounter (Signed)
Last refill: 10/13/16 90 tabs  OV 03/14/16

## 2017-01-09 ENCOUNTER — Other Ambulatory Visit: Payer: Self-pay | Admitting: Family Medicine

## 2017-01-27 ENCOUNTER — Ambulatory Visit: Payer: Self-pay

## 2017-01-27 NOTE — Telephone Encounter (Signed)
  Reason for Disposition . [1] MODERATE longstanding difficulty breathing (e.g., speaks in phrases, SOB even at rest, pulse 100-120) AND [2] SAME as normal  Answer Assessment - Initial Assessment Questions 1. RESPIRATORY STATUS: "Describe your breathing?" (e.g., wheezing, shortness of breath, unable to speak, severe coughing)      Shortness of breath with walking up steps 2. ONSET: "When did this breathing problem begin?"      Started last week 3. PATTERN "Does the difficult breathing come and go, or has it been constant since it started?"      Comes and goes 4. SEVERITY: "How bad is your breathing?" (e.g., mild, moderate, severe)    - MILD: No SOB at rest, mild SOB with walking, speaks normally in sentences, can lay down, no retractions, pulse < 100.    - MODERATE: SOB at rest, SOB with minimal exertion and prefers to sit, cannot lie down flat, speaks in phrases, mild retractions, audible wheezing, pulse 100-120.    - SEVERE: Very SOB at rest, speaks in single words, struggling to breathe, sitting hunched forward, retractions, pulse > 120      Moderate 5. RECURRENT SYMPTOM: "Have you had difficulty breathing before?" If so, ask: "When was the last time?" and "What happened that time?"      No 6. CARDIAC HISTORY: "Do you have any history of heart disease?" (e.g., heart attack, angina, bypass surgery, angioplasty)      Yes - CHF 7. LUNG HISTORY: "Do you have any history of lung disease?"  (e.g., pulmonary embolus, asthma, emphysema)     No 8. CAUSE: "What do you think is causing the breathing problem?"      Unsure 9. OTHER SYMPTOMS: "Do you have any other symptoms? (e.g., dizziness, runny nose, cough, chest pain, fever)     No 10. PREGNANCY: "Is there any chance you are pregnant?" "When was your last menstrual period?"       No 11. TRAVEL: "Have you traveled out of the country in the last month?" (e.g., travel history, exposures)       No  Protocols used: BREATHING DIFFICULTY-A-AH Pt.  Has heart history including CHF. Denies any swelling. Instructed if breathing becomes more difficult or develops chest pain to call 911. Verbalizes understanding. Appointment made for tomorrow.

## 2017-01-28 ENCOUNTER — Telehealth: Payer: Self-pay | Admitting: Family Medicine

## 2017-01-28 ENCOUNTER — Ambulatory Visit (HOSPITAL_BASED_OUTPATIENT_CLINIC_OR_DEPARTMENT_OTHER)
Admission: RE | Admit: 2017-01-28 | Discharge: 2017-01-28 | Disposition: A | Discharge status: Home / Self Care | Payer: Medicare HMO | Source: Ambulatory Visit | Attending: Internal Medicine | Admitting: Internal Medicine

## 2017-01-28 ENCOUNTER — Telehealth: Payer: Self-pay | Admitting: Internal Medicine

## 2017-01-28 ENCOUNTER — Ambulatory Visit (INDEPENDENT_AMBULATORY_CARE_PROVIDER_SITE_OTHER): Payer: Medicare HMO | Admitting: Internal Medicine

## 2017-01-28 ENCOUNTER — Encounter: Payer: Self-pay | Admitting: Internal Medicine

## 2017-01-28 VITALS — BP 132/82 | HR 72 | Temp 98.2°F | Resp 16 | Ht 74.0 in | Wt 196.5 lb

## 2017-01-28 DIAGNOSIS — F17210 Nicotine dependence, cigarettes, uncomplicated: Secondary | ICD-10-CM | POA: Diagnosis not present

## 2017-01-28 DIAGNOSIS — R0602 Shortness of breath: Secondary | ICD-10-CM | POA: Diagnosis not present

## 2017-01-28 DIAGNOSIS — J9811 Atelectasis: Secondary | ICD-10-CM | POA: Diagnosis not present

## 2017-01-28 DIAGNOSIS — I255 Ischemic cardiomyopathy: Secondary | ICD-10-CM | POA: Diagnosis not present

## 2017-01-28 DIAGNOSIS — R0609 Other forms of dyspnea: Secondary | ICD-10-CM | POA: Diagnosis not present

## 2017-01-28 DIAGNOSIS — I5023 Acute on chronic systolic (congestive) heart failure: Secondary | ICD-10-CM | POA: Diagnosis not present

## 2017-01-28 DIAGNOSIS — E669 Obesity, unspecified: Secondary | ICD-10-CM | POA: Diagnosis not present

## 2017-01-28 DIAGNOSIS — J90 Pleural effusion, not elsewhere classified: Secondary | ICD-10-CM

## 2017-01-28 DIAGNOSIS — I447 Left bundle-branch block, unspecified: Secondary | ICD-10-CM | POA: Diagnosis not present

## 2017-01-28 DIAGNOSIS — I252 Old myocardial infarction: Secondary | ICD-10-CM | POA: Diagnosis not present

## 2017-01-28 DIAGNOSIS — J438 Other emphysema: Secondary | ICD-10-CM | POA: Diagnosis present

## 2017-01-28 DIAGNOSIS — I480 Paroxysmal atrial fibrillation: Secondary | ICD-10-CM | POA: Diagnosis not present

## 2017-01-28 DIAGNOSIS — Z9114 Patient's other noncompliance with medication regimen: Secondary | ICD-10-CM | POA: Diagnosis not present

## 2017-01-28 DIAGNOSIS — I2510 Atherosclerotic heart disease of native coronary artery without angina pectoris: Secondary | ICD-10-CM | POA: Diagnosis not present

## 2017-01-28 DIAGNOSIS — Z7901 Long term (current) use of anticoagulants: Secondary | ICD-10-CM | POA: Diagnosis not present

## 2017-01-28 DIAGNOSIS — I10 Essential (primary) hypertension: Secondary | ICD-10-CM | POA: Diagnosis not present

## 2017-01-28 DIAGNOSIS — R918 Other nonspecific abnormal finding of lung field: Secondary | ICD-10-CM

## 2017-01-28 DIAGNOSIS — G4733 Obstructive sleep apnea (adult) (pediatric): Secondary | ICD-10-CM | POA: Diagnosis not present

## 2017-01-28 DIAGNOSIS — Z716 Tobacco abuse counseling: Secondary | ICD-10-CM | POA: Diagnosis not present

## 2017-01-28 DIAGNOSIS — I509 Heart failure, unspecified: Secondary | ICD-10-CM | POA: Diagnosis not present

## 2017-01-28 DIAGNOSIS — N179 Acute kidney failure, unspecified: Secondary | ICD-10-CM | POA: Diagnosis not present

## 2017-01-28 DIAGNOSIS — R7989 Other specified abnormal findings of blood chemistry: Secondary | ICD-10-CM | POA: Diagnosis not present

## 2017-01-28 DIAGNOSIS — F411 Generalized anxiety disorder: Secondary | ICD-10-CM | POA: Diagnosis not present

## 2017-01-28 DIAGNOSIS — R0989 Other specified symptoms and signs involving the circulatory and respiratory systems: Secondary | ICD-10-CM

## 2017-01-28 DIAGNOSIS — F17290 Nicotine dependence, other tobacco product, uncomplicated: Secondary | ICD-10-CM | POA: Diagnosis present

## 2017-01-28 DIAGNOSIS — I420 Dilated cardiomyopathy: Secondary | ICD-10-CM | POA: Diagnosis not present

## 2017-01-28 DIAGNOSIS — I361 Nonrheumatic tricuspid (valve) insufficiency: Secondary | ICD-10-CM | POA: Diagnosis not present

## 2017-01-28 DIAGNOSIS — E1169 Type 2 diabetes mellitus with other specified complication: Secondary | ICD-10-CM | POA: Diagnosis not present

## 2017-01-28 DIAGNOSIS — R0902 Hypoxemia: Secondary | ICD-10-CM | POA: Diagnosis present

## 2017-01-28 DIAGNOSIS — I110 Hypertensive heart disease with heart failure: Secondary | ICD-10-CM | POA: Diagnosis not present

## 2017-01-28 DIAGNOSIS — R0603 Acute respiratory distress: Secondary | ICD-10-CM | POA: Diagnosis present

## 2017-01-28 DIAGNOSIS — I214 Non-ST elevation (NSTEMI) myocardial infarction: Secondary | ICD-10-CM | POA: Diagnosis not present

## 2017-01-28 DIAGNOSIS — E119 Type 2 diabetes mellitus without complications: Secondary | ICD-10-CM | POA: Diagnosis not present

## 2017-01-28 DIAGNOSIS — R748 Abnormal levels of other serum enzymes: Secondary | ICD-10-CM | POA: Diagnosis not present

## 2017-01-28 DIAGNOSIS — Z951 Presence of aortocoronary bypass graft: Secondary | ICD-10-CM | POA: Diagnosis not present

## 2017-01-28 DIAGNOSIS — Z8249 Family history of ischemic heart disease and other diseases of the circulatory system: Secondary | ICD-10-CM | POA: Diagnosis not present

## 2017-01-28 DIAGNOSIS — F1010 Alcohol abuse, uncomplicated: Secondary | ICD-10-CM | POA: Diagnosis present

## 2017-01-28 DIAGNOSIS — I517 Cardiomegaly: Secondary | ICD-10-CM

## 2017-01-28 DIAGNOSIS — I4891 Unspecified atrial fibrillation: Secondary | ICD-10-CM | POA: Diagnosis not present

## 2017-01-28 DIAGNOSIS — I5043 Acute on chronic combined systolic (congestive) and diastolic (congestive) heart failure: Secondary | ICD-10-CM | POA: Diagnosis not present

## 2017-01-28 DIAGNOSIS — E785 Hyperlipidemia, unspecified: Secondary | ICD-10-CM | POA: Diagnosis not present

## 2017-01-28 DIAGNOSIS — I248 Other forms of acute ischemic heart disease: Secondary | ICD-10-CM | POA: Diagnosis not present

## 2017-01-28 LAB — COMPREHENSIVE METABOLIC PANEL
ALT: 12 U/L (ref 0–53)
AST: 15 U/L (ref 0–37)
Albumin: 3.8 g/dL (ref 3.5–5.2)
Alkaline Phosphatase: 62 U/L (ref 39–117)
BILIRUBIN TOTAL: 2.4 mg/dL — AB (ref 0.2–1.2)
BUN: 12 mg/dL (ref 6–23)
CO2: 29 meq/L (ref 19–32)
Calcium: 9 mg/dL (ref 8.4–10.5)
Chloride: 99 mEq/L (ref 96–112)
Creatinine, Ser: 0.91 mg/dL (ref 0.40–1.50)
GFR: 87.87 mL/min (ref >60.00)
GLUCOSE: 85 mg/dL (ref 70–99)
POTASSIUM: 4.7 meq/L (ref 3.5–5.1)
SODIUM: 134 meq/L — AB (ref 135–145)
TOTAL PROTEIN: 6.5 g/dL (ref 6.0–8.3)

## 2017-01-28 LAB — CBC WITH DIFFERENTIAL/PLATELET
BASOS ABS: 0 10*3/uL (ref 0.0–0.1)
BASOS PCT: 0.5 % (ref 0.0–3.0)
Eosinophils Absolute: 0.1 10*3/uL (ref 0.0–0.7)
Eosinophils Relative: 0.8 % (ref 0.0–5.0)
HCT: 46.8 % (ref 39.0–52.0)
Hemoglobin: 15.2 g/dL (ref 13.0–17.0)
LYMPHS ABS: 0.9 10*3/uL (ref 0.7–4.0)
Lymphocytes Relative: 13.1 % (ref 12.0–46.0)
MCHC: 32.4 g/dL (ref 30.0–36.0)
MCV: 96 fl (ref 78.0–100.0)
MONOS PCT: 8.8 % (ref 3.0–12.0)
Monocytes Absolute: 0.6 10*3/uL (ref 0.1–1.0)
NEUTROS ABS: 5.4 10*3/uL (ref 1.4–7.7)
NEUTROS PCT: 76.8 % (ref 43.0–77.0)
PLATELETS: 166 10*3/uL (ref 150.0–400.0)
RBC: 4.88 Mil/uL (ref 4.22–5.81)
RDW: 16.3 % — AB (ref 11.5–15.5)
WBC: 7 10*3/uL (ref 4.0–10.5)

## 2017-01-28 LAB — BRAIN NATRIURETIC PEPTIDE: Pro B Natriuretic peptide (BNP): 1187 pg/mL — ABNORMAL HIGH (ref 0.0–100.0)

## 2017-01-28 NOTE — Progress Notes (Signed)
Subjective:    Patient ID: Larry Rangel, male    DOB: 1950/10/05, 67 y.o.   MRN: [id number]  DOS:  01/28/2017 Type of visit - description : Acute visit Interval history: His CC is SOB, started a few weeks ago, reports that he feels sometimes that he is not getting enough oxygen. States he walks without problems although today we walked him around the office, he reports some shortness of breath but O2 sat did not drop. There are no associated symptoms. Good compliance with medications, has not taken albuterol in a while.  Wt Readings from Last 3 Encounters:  01/28/17 196 lb 8 oz (89.1 kg)  07/30/16 192 lb (87.1 kg)  05/29/16 198 lb 6.4 oz (90 kg)    Review of Systems Denies fever chills No runny nose or sore throat No recent airplane trips or prolonged car trips.  No lower extremity edema or calf pain No chest pain or palpitations. Continues smoking half pack a day. No nausea, vomiting or change in the color of the stools  Past Medical History:  Diagnosis Date  . Abnormal liver function 08/23/2010  . Anxiety   . Arthritis   . CAD (coronary artery disease)    a. s/p CABG 2009. b. Cath 07/2013: 3/5 patent grafts (appear to have chronic occ grafts), mod diag/L PL branch, did not appear flow limiting, elevated filling pressures.  . CHF (congestive heart failure) ([place]) 07/30/2013  . Chicken pox as a child  . Chronic combined systolic and diastolic CHF (congestive heart failure) ([place])    a. Echo 6/14: Mild LVH, EF 30-35%, diffuse HK, MAC, mild BAE. b. Drop in EF to 20% by echo 07/2013.  . Diabetes mellitus ([place])   . Hyperlipidemia   . Hypertension   . LBBB (left bundle branch block)   . Low back pain   . Measles as a child  . Mumps as a child  . Myocardial infarction ([place])   . PAF (paroxysmal atrial fibrillation) ([place])   . Paroxysmal atrial flutter ([place]) 07-2012   a. s/p ablation by Dr [name] 08-06-2012. b. Recurrence 07/2013.   Mar[facility] health care 12/10/2014  . S/P  colonoscopy   . Tobacco user     Past Surgical History:  Procedure Laterality Date  . ABLATION OF DYSRHYTHMIC FOCUS  08/06/2012   CTI ablation by Dr [name]  . ATRIAL FLUTTER ABLATION N/A 08/06/2012   Procedure: ATRIAL FLUTTER ABLATION;  Surgeon: Deboraha Sprang, MD;  Location: [facility] CATH LAB;  Service: Cardiovascular;  Laterality: N/A;  . CARDIAC CATHETERIZATION    . CARDIOVERSION N/A 07/15/2012   Procedure: CARDIOVERSION;  Surgeon: Thayer Headings, MD;  Location: [place];  Service: Cardiovascular;  Laterality: N/A;  . CORONARY ARTERY BYPASS GRAFT  2009   x 5  . IMPLANTABLE CARDIOVERTER DEFIBRILLATOR IMPLANT N/A 11/10/2013   Procedure: SUB Q IMPLANTABLE CARDIOVERTER DEFIBRILLATOR IMPLANT;  Surgeon: Deboraha Sprang, MD;  Location: [facility] CATH LAB;  Service: Cardiovascular;  Laterality: N/A;  . LEFT HEART CATHETERIZATION WITH CORONARY ANGIOGRAM N/A 08/01/2013   Procedure: LEFT HEART CATHETERIZATION WITH CORONARY ANGIOGRAM;  Surgeon: Burnell Blanks, MD;  Location[facility] C[facility] LAB;  Service: Cardiovascular;  Laterality: N/A;  . POCKET REVISION N/A 12/16/2013   Procedure: POCKET REVISION;  Surgeon: Evans Lance, MD;  Location: [facility] CATH LAB;  Service: Cardiovascular;  Laterality: N/A;  . SHOULDER SURGERY    . TEE WITHOUT CARDIOVERSION N/A 07/15/2012   Procedure: TRANSESOPHAGEAL ECHOCARDIOGRAM (TEE);  Surgeon: Wonda Cheng Nahser,  MD;  Location: [place];  Service: Cardiovascular;  Laterality: N/A;  . TONSILLECTOMY      Social History   Socioeconomic History  . Marital status: Married    Spouse name: Not on file  . Number of children: Not on file  . Years of education: Not on file  . Highest education level: Not on file  Social Needs  . Financial resource strain: Not on file  . Food insecurity - worry: Not on file  . Food insecurity - inability: Not on file  . Transportation needs - medical: Not on file  . Transportation needs - non-medical: Not on file  Occupational History  . Not on  file  Tobacco Use  . Smoking status: Former Smoker    Packs/day: 1.00    Years: 40.00    Pack years: 40.00    Types: Cigarettes    Last attempt to quit: 02/01/2011    Years since quitting: 5.9  . Smokeless tobacco: Never Used  Substance and Sexual Activity  . Alcohol use: Yes    Comment: drinks 2 bottles of wine per week  . Drug use: No  . Sexual activity: Yes    Birth control/protection: Coitus interruptus  Other Topics Concern  . 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   He has been married four times previously, the first  For 77yr and has two children by the marriage which are grown, the second time for 11 yrs and has 2 by that marriage.   Wife has custody in [place]   The third marriage was 4 1/2 yrs and most recent marriage was the past eight weeks and he is currently estranged from that person.      Allergies as of 01/28/2017   No Known Allergies     Medication List        Accurate as of 01/28/17 10:39 AM. Always use your most recent med list.          albuterol 108 (90 Base) MCG/ACT inhaler Commonly known as:  PROVENTIL HFA;VENTOLIN HFA Inhale 2 puffs into the lungs every 6 (six) hours as needed for wheezing or shortness of breath.   atorvastatin 20 MG tablet Commonly known as:  LIPITOR TAKE 1 TABLET (20 MG TOTAL) BY MOUTH DAILY.   busPIRone 10 MG tablet Commonly known as:  BUSPAR TAKE 1 TABLET BY MOUTH TWICE A DAY   carvedilol 12.5 MG tablet Commonly known as:  COREG TAKE 1 TABLET BY MOUTH TWICE DAILY WITH A MEAL   clonazePAM 0.5 MG tablet Commonly known as:  KLONOPIN Take 1 tablet (0.5 mg total) by mouth 3 (three) times daily as needed.   furosemide 40 MG tablet Commonly known as:  LASIX TAKE 1 TABLET BY MOUTH DAILY. IF WEIGHT INCREASED > 3LBS OVERNIGHT INCREASE BY ONE TABLET   glipiZIDE 5 MG  tablet Commonly known as:  GLUCOTROL TAKE 1/2 TABLET (2.5 MG TOTAL) BY MOUTH ONCE A DAY BEFORE BREAKFAST   irbesartan 150 MG tablet Commonly known as:  AVAPRO TAKE 1 TABLET (150 MG TOTAL) BY MOUTH DAILY.   metFORMIN 500 MG tablet Commonly known as:  GLUCOPHAGE TAKE 1 TABLET BY MOUTH TWICE DAILY WITH A MEAL   OVER THE COUNTER MEDICATION Place 1 drop into both eyes as needed (for dry eyes). "OTC Good Sense Eye Drops"   rivaroxaban 20 MG  Tabs tablet Commonly known as:  XARELTO TAKE 1 TABLET BY MOUTH EVERY DAY WITH SUPPER          Objective:   Physical Exam BP 132/82 (BP Location: Left Arm, Patient Position: Sitting, Cuff Size: Normal)   Pulse 72   Temp 98.2 F (36.8 C) (Oral)   Resp 16   Ht _0  (1.88 m)   Wt 196 lb 8 oz (89.1 kg)   SpO2 93%   BMI 25.23 kg/m  General:   Well developed, well nourished . NAD.  Slightly pale? HEENT:  Normocephalic . Face symmetric, atraumatic Neck : No JVD Lungs:  Slightly decreased breath sounds but otherwise clear Normal respiratory effort, no intercostal retractions, no accessory muscle use. Heart: Regular?,  no murmur.  no pretibial edema bilaterally.  Calves symmetric Abdomen:  Not distended, soft, non-tender. No rebound or rigidity.   Skin:   Not jaundice Neurologic:  alert & oriented X3.  Speech normal, gait appropriate for age and unassisted Psych--  Cognition and judgment appear intact.  Cooperative with normal attention span and concentration.  Behavior appropriate. No anxious or depressed appearing.     Assessment & Plan:    67 year old gentleman with multiple medical problems including  CAD, ischemic cardiomyopathy, has a defibrillator , HTN, paroxysmal A. Fib on Xarelto, sleep apnea, COPD, DM, anxiety,presents w/:  SOB/DOE:  EKG: A. fib, previous EKG sinus rhythm, that is likely playing a role in his symptoms.  No evidence of volume overload.  He is also noted to be slightly pale, GI ROS negative.  He denies  chest pain.  Will contact cardiology today for further advice.  In the meantime will do the following: Chest x-ray, BNP, CMP, CBC.  ER if symptoms severe.  Further advised results. Addendum: I discussed the EKG with cardiology, labs and chest x-ray pending, they are concerned about him being on atrial fibrillation and short of breath and recommend ER. Called the patient, explained him the situation, he agreed with my advice and will go to the ER at [facility].  Will have to see cardiology there.

## 2017-01-28 NOTE — Progress Notes (Signed)
Pre visit review using our clinic review tool, if applicable. No additional management support is needed unless otherwise documented below in the visit note. 

## 2017-01-28 NOTE — Patient Instructions (Signed)
GO TO THE LAB : Get the blood work      STOP BY THE FIRST FLOOR:  get the XR    Go to the ER if severe symptoms, chest pain, palpitations.

## 2017-01-28 NOTE — Telephone Encounter (Signed)
F/u Message  Pt wife call back. Pt is currently SOB and feels like his going to pass out

## 2017-01-28 NOTE — Telephone Encounter (Signed)
Pt c/o Shortness Of Breath: STAT if SOB developed within the last 24 hours or pt is noticeably SOB on the phone  1. Are you currently SOB (can you hear that pt is SOB on the phone)?Mrs. Grahn called ( Wife)   2. How long have you been experiencing SOB? 1week   3. Are you SOB when sitting or when up moving around? Both  4. Are you currently experiencing any other symptoms?He is not eating or drinking in 3 days

## 2017-01-28 NOTE — Telephone Encounter (Signed)
Spoke with pt's wife, pt is very sob ,some swelling noted to feet.  Per wife pt has hardly anything to eat or drink in 3 days and also pt has stopped Xarelto is unable to afford ,not sure how long has been without. Instructed wife that pt needs to go to ER for eval and tx .Dr Drue Novel called and spoke with Dr Katrinka Blazing and they have agreed wil sent pt to ED .Zack Seal

## 2017-01-28 NOTE — Telephone Encounter (Signed)
Pt called to state he will go to the ER in the morning, contact if needed

## 2017-01-28 NOTE — Telephone Encounter (Signed)
Pt called and was informed of the information and states he will go to the ED in the morning, contact pt if needed

## 2017-01-28 NOTE — Telephone Encounter (Signed)
Copied from CRM [id number]. Topic: Referral - Request >> Jan 28, 2017  3:45 PM Guinevere Ferrari, NT wrote: CRM for notification. See Telephone encounter for: Patient is calling to see if the doctor can give him a referral to go to a Pulmonary doctor and would like a call back.  01/28/17.

## 2017-01-29 ENCOUNTER — Inpatient Hospital Stay (HOSPITAL_COMMUNITY)
Admission: EM | Admit: 2017-01-29 | Discharge: 2017-02-02 | DRG: 292 | Disposition: A | Discharge status: Home / Self Care | Payer: Medicare HMO | Attending: Internal Medicine | Admitting: Internal Medicine

## 2017-01-29 ENCOUNTER — Other Ambulatory Visit: Payer: Self-pay | Admitting: Family Medicine

## 2017-01-29 ENCOUNTER — Encounter (HOSPITAL_COMMUNITY): Payer: Self-pay | Admitting: Internal Medicine

## 2017-01-29 ENCOUNTER — Other Ambulatory Visit: Payer: Self-pay

## 2017-01-29 ENCOUNTER — Emergency Department (HOSPITAL_COMMUNITY): Payer: Medicare HMO

## 2017-01-29 DIAGNOSIS — F17210 Nicotine dependence, cigarettes, uncomplicated: Secondary | ICD-10-CM | POA: Diagnosis not present

## 2017-01-29 DIAGNOSIS — E785 Hyperlipidemia, unspecified: Secondary | ICD-10-CM | POA: Diagnosis present

## 2017-01-29 DIAGNOSIS — Z9114 Patient's other noncompliance with medication regimen: Secondary | ICD-10-CM | POA: Diagnosis not present

## 2017-01-29 DIAGNOSIS — E119 Type 2 diabetes mellitus without complications: Secondary | ICD-10-CM | POA: Diagnosis present

## 2017-01-29 DIAGNOSIS — F17290 Nicotine dependence, other tobacco product, uncomplicated: Secondary | ICD-10-CM | POA: Diagnosis present

## 2017-01-29 DIAGNOSIS — E669 Obesity, unspecified: Secondary | ICD-10-CM | POA: Diagnosis not present

## 2017-01-29 DIAGNOSIS — R0603 Acute respiratory distress: Secondary | ICD-10-CM | POA: Diagnosis present

## 2017-01-29 DIAGNOSIS — I447 Left bundle-branch block, unspecified: Secondary | ICD-10-CM | POA: Diagnosis present

## 2017-01-29 DIAGNOSIS — I509 Heart failure, unspecified: Secondary | ICD-10-CM

## 2017-01-29 DIAGNOSIS — F1010 Alcohol abuse, uncomplicated: Secondary | ICD-10-CM | POA: Diagnosis present

## 2017-01-29 DIAGNOSIS — Z7901 Long term (current) use of anticoagulants: Secondary | ICD-10-CM

## 2017-01-29 DIAGNOSIS — I5043 Acute on chronic combined systolic (congestive) and diastolic (congestive) heart failure: Secondary | ICD-10-CM | POA: Diagnosis present

## 2017-01-29 DIAGNOSIS — I255 Ischemic cardiomyopathy: Secondary | ICD-10-CM | POA: Diagnosis present

## 2017-01-29 DIAGNOSIS — N179 Acute kidney failure, unspecified: Secondary | ICD-10-CM | POA: Diagnosis present

## 2017-01-29 DIAGNOSIS — I420 Dilated cardiomyopathy: Secondary | ICD-10-CM | POA: Diagnosis present

## 2017-01-29 DIAGNOSIS — E1169 Type 2 diabetes mellitus with other specified complication: Secondary | ICD-10-CM | POA: Diagnosis not present

## 2017-01-29 DIAGNOSIS — F411 Generalized anxiety disorder: Secondary | ICD-10-CM | POA: Diagnosis present

## 2017-01-29 DIAGNOSIS — Z79899 Other long term (current) drug therapy: Secondary | ICD-10-CM

## 2017-01-29 DIAGNOSIS — Z95810 Presence of automatic (implantable) cardiac defibrillator: Secondary | ICD-10-CM

## 2017-01-29 DIAGNOSIS — I361 Nonrheumatic tricuspid (valve) insufficiency: Secondary | ICD-10-CM | POA: Diagnosis not present

## 2017-01-29 DIAGNOSIS — I252 Old myocardial infarction: Secondary | ICD-10-CM

## 2017-01-29 DIAGNOSIS — I10 Essential (primary) hypertension: Secondary | ICD-10-CM | POA: Diagnosis not present

## 2017-01-29 DIAGNOSIS — I110 Hypertensive heart disease with heart failure: Principal | ICD-10-CM | POA: Diagnosis present

## 2017-01-29 DIAGNOSIS — Z9119 Patient's noncompliance with other medical treatment and regimen: Secondary | ICD-10-CM

## 2017-01-29 DIAGNOSIS — G4733 Obstructive sleep apnea (adult) (pediatric): Secondary | ICD-10-CM | POA: Diagnosis present

## 2017-01-29 DIAGNOSIS — Z716 Tobacco abuse counseling: Secondary | ICD-10-CM | POA: Diagnosis not present

## 2017-01-29 DIAGNOSIS — R0602 Shortness of breath: Secondary | ICD-10-CM

## 2017-01-29 DIAGNOSIS — Z951 Presence of aortocoronary bypass graft: Secondary | ICD-10-CM | POA: Diagnosis not present

## 2017-01-29 DIAGNOSIS — R0902 Hypoxemia: Secondary | ICD-10-CM | POA: Diagnosis present

## 2017-01-29 DIAGNOSIS — I2510 Atherosclerotic heart disease of native coronary artery without angina pectoris: Secondary | ICD-10-CM | POA: Diagnosis present

## 2017-01-29 DIAGNOSIS — I480 Paroxysmal atrial fibrillation: Secondary | ICD-10-CM | POA: Diagnosis present

## 2017-01-29 DIAGNOSIS — I248 Other forms of acute ischemic heart disease: Secondary | ICD-10-CM | POA: Diagnosis present

## 2017-01-29 DIAGNOSIS — R778 Other specified abnormalities of plasma proteins: Secondary | ICD-10-CM

## 2017-01-29 DIAGNOSIS — J438 Other emphysema: Secondary | ICD-10-CM | POA: Diagnosis present

## 2017-01-29 DIAGNOSIS — Z7984 Long term (current) use of oral hypoglycemic drugs: Secondary | ICD-10-CM

## 2017-01-29 DIAGNOSIS — R0989 Other specified symptoms and signs involving the circulatory and respiratory systems: Secondary | ICD-10-CM

## 2017-01-29 DIAGNOSIS — I4891 Unspecified atrial fibrillation: Secondary | ICD-10-CM

## 2017-01-29 DIAGNOSIS — Z7141 Alcohol abuse counseling and surveillance of alcoholic: Secondary | ICD-10-CM

## 2017-01-29 DIAGNOSIS — Z8249 Family history of ischemic heart disease and other diseases of the circulatory system: Secondary | ICD-10-CM | POA: Diagnosis not present

## 2017-01-29 DIAGNOSIS — I214 Non-ST elevation (NSTEMI) myocardial infarction: Secondary | ICD-10-CM

## 2017-01-29 DIAGNOSIS — R7989 Other specified abnormal findings of blood chemistry: Secondary | ICD-10-CM

## 2017-01-29 DIAGNOSIS — R748 Abnormal levels of other serum enzymes: Secondary | ICD-10-CM | POA: Diagnosis not present

## 2017-01-29 HISTORY — DX: Presence of automatic (implantable) cardiac defibrillator: Z95.810

## 2017-01-29 HISTORY — DX: Type 2 diabetes mellitus without complications: E11.9

## 2017-01-29 LAB — CBC WITH DIFFERENTIAL/PLATELET
BASOS ABS: 0 10*3/uL (ref 0.0–0.1)
BASOS PCT: 0 %
EOS ABS: 0.1 10*3/uL (ref 0.0–0.7)
EOS PCT: 2 %
HCT: 43.9 % (ref 39.0–52.0)
Hemoglobin: 14.5 g/dL (ref 13.0–17.0)
Lymphocytes Relative: 19 %
Lymphs Abs: 1.6 10*3/uL (ref 0.7–4.0)
MCH: 31.1 pg (ref 26.0–34.0)
MCHC: 33 g/dL (ref 30.0–36.0)
MCV: 94.2 fL (ref 78.0–100.0)
MONO ABS: 0.7 10*3/uL (ref 0.1–1.0)
Monocytes Relative: 8 %
Neutro Abs: 5.9 10*3/uL (ref 1.7–7.7)
Neutrophils Relative %: 71 %
PLATELETS: 172 10*3/uL (ref 150–400)
RBC: 4.66 MIL/uL (ref 4.22–5.81)
RDW: 14.5 % (ref 11.5–15.5)
WBC: 8.3 10*3/uL (ref 4.0–10.5)

## 2017-01-29 LAB — BASIC METABOLIC PANEL
Anion gap: 10 (ref 5–15)
BUN: 12 mg/dL (ref 6–20)
CALCIUM: 9.1 mg/dL (ref 8.9–10.3)
CO2: 22 mmol/L (ref 22–32)
CREATININE: 0.86 mg/dL (ref 0.61–1.24)
Chloride: 101 mmol/L (ref 101–111)
GFR calc Af Amer: >60 mL/min (ref >60)
GLUCOSE: 92 mg/dL (ref 65–99)
Potassium: 4.1 mmol/L (ref 3.5–5.1)
Sodium: 133 mmol/L — ABNORMAL LOW (ref 135–145)

## 2017-01-29 LAB — HEMOGLOBIN A1C
Hgb A1c MFr Bld: 4.8 % (ref 4.8–5.6)
Mean Plasma Glucose: 91.06 mg/dL

## 2017-01-29 LAB — D-DIMER, QUANTITATIVE: D-Dimer, Quant: 1.03 ug/mL-FEU — ABNORMAL HIGH (ref 0.00–0.50)

## 2017-01-29 LAB — GLUCOSE, CAPILLARY: Glucose-Capillary: 132 mg/dL — ABNORMAL HIGH (ref 65–99)

## 2017-01-29 LAB — BRAIN NATRIURETIC PEPTIDE: B NATRIURETIC PEPTIDE 5: 1151.4 pg/mL — AB (ref 0.0–100.0)

## 2017-01-29 LAB — TROPONIN I
TROPONIN I: 0.46 ng/mL — AB (ref <0.03)
Troponin I: 0.42 ng/mL (ref <0.03)
Troponin I: 0.47 ng/mL (ref <0.03)

## 2017-01-29 LAB — CBG MONITORING, ED: Glucose-Capillary: 90 mg/dL (ref 65–99)

## 2017-01-29 LAB — I-STAT TROPONIN, ED: Troponin i, poc: 0.23 ng/mL (ref 0.00–0.08)

## 2017-01-29 LAB — TSH: TSH: 0.855 u[IU]/mL (ref 0.350–4.500)

## 2017-01-29 MED ORDER — RIVAROXABAN 20 MG PO TABS
20.0000 mg | ORAL_TABLET | Freq: Every day | ORAL | Status: DC
  Administered 2017-01-29 – 2017-02-01 (×4): 20 mg via ORAL
  Filled 2017-01-29 (×4): qty 1

## 2017-01-29 MED ORDER — ACETAMINOPHEN 650 MG RE SUPP: 650.0000 mg | Freq: Four times a day (QID) | RECTAL | Status: DC | PRN

## 2017-01-29 MED ORDER — CARVEDILOL 12.5 MG PO TABS
12.5000 mg | ORAL_TABLET | Freq: Two times a day (BID) | ORAL | Status: DC
  Administered 2017-01-29 – 2017-02-02 (×8): 12.5 mg via ORAL
  Filled 2017-01-29 (×8): qty 1

## 2017-01-29 MED ORDER — BUSPIRONE HCL 10 MG PO TABS
10.0000 mg | ORAL_TABLET | Freq: Two times a day (BID) | ORAL | Status: DC
  Administered 2017-01-29 – 2017-02-02 (×8): 10 mg via ORAL
  Filled 2017-01-29 (×8): qty 1

## 2017-01-29 MED ORDER — FUROSEMIDE 10 MG/ML IJ SOLN: 40.0000 mg | Freq: Every day | INTRAMUSCULAR | Status: DC

## 2017-01-29 MED ORDER — ONDANSETRON HCL 4 MG/2ML IJ SOLN: 4.0000 mg | Freq: Four times a day (QID) | INTRAMUSCULAR | Status: DC | PRN

## 2017-01-29 MED ORDER — SENNOSIDES-DOCUSATE SODIUM 8.6-50 MG PO TABS: 1.0000 | ORAL_TABLET | Freq: Every evening | ORAL | Status: DC | PRN

## 2017-01-29 MED ORDER — FUROSEMIDE 10 MG/ML IJ SOLN
40.0000 mg | Freq: Two times a day (BID) | INTRAMUSCULAR | Status: DC
  Administered 2017-01-29 – 2017-01-31 (×4): 40 mg via INTRAVENOUS
  Filled 2017-01-29 (×4): qty 4

## 2017-01-29 MED ORDER — THIAMINE HCL 100 MG/ML IJ SOLN
100.0000 mg | Freq: Every day | INTRAMUSCULAR | Status: DC
  Filled 2017-01-29: qty 2

## 2017-01-29 MED ORDER — ALBUTEROL SULFATE (2.5 MG/3ML) 0.083% IN NEBU: 2.5000 mg | INHALATION_SOLUTION | Freq: Four times a day (QID) | RESPIRATORY_TRACT | Status: DC | PRN

## 2017-01-29 MED ORDER — INSULIN ASPART 100 UNIT/ML ~~LOC~~ SOLN
0.0000 [IU] | Freq: Three times a day (TID) | SUBCUTANEOUS | Status: DC
  Administered 2017-01-31 – 2017-02-02 (×3): 2 [IU] via SUBCUTANEOUS

## 2017-01-29 MED ORDER — FUROSEMIDE 20 MG PO TABS: 40.0000 mg | ORAL_TABLET | Freq: Every day | ORAL | Status: DC

## 2017-01-29 MED ORDER — ACETAMINOPHEN 325 MG PO TABS: 650.0000 mg | ORAL_TABLET | Freq: Four times a day (QID) | ORAL | Status: DC | PRN

## 2017-01-29 MED ORDER — ADULT MULTIVITAMIN W/MINERALS CH
1.0000 | ORAL_TABLET | Freq: Every day | ORAL | Status: DC
  Administered 2017-01-30 – 2017-02-02 (×4): 1 via ORAL
  Filled 2017-01-29 (×4): qty 1

## 2017-01-29 MED ORDER — HYDROCODONE-ACETAMINOPHEN 5-325 MG PO TABS: 1.0000 | ORAL_TABLET | ORAL | Status: DC | PRN

## 2017-01-29 MED ORDER — ONDANSETRON HCL 4 MG PO TABS: 4.0000 mg | ORAL_TABLET | Freq: Four times a day (QID) | ORAL | Status: DC | PRN

## 2017-01-29 MED ORDER — VITAMIN B-1 100 MG PO TABS
100.0000 mg | ORAL_TABLET | Freq: Every day | ORAL | Status: DC
  Administered 2017-01-30 – 2017-02-02 (×4): 100 mg via ORAL
  Filled 2017-01-29 (×4): qty 1

## 2017-01-29 MED ORDER — IRBESARTAN 150 MG PO TABS
150.0000 mg | ORAL_TABLET | Freq: Every day | ORAL | Status: DC
  Administered 2017-01-29 – 2017-02-02 (×5): 150 mg via ORAL
  Filled 2017-01-29 (×6): qty 1

## 2017-01-29 MED ORDER — LORAZEPAM 1 MG PO TABS
1.0000 mg | ORAL_TABLET | Freq: Four times a day (QID) | ORAL | Status: DC | PRN
  Administered 2017-01-30: 1 mg via ORAL
  Filled 2017-01-29 (×2): qty 1

## 2017-01-29 MED ORDER — CLONAZEPAM 0.5 MG PO TABS
0.5000 mg | ORAL_TABLET | Freq: Three times a day (TID) | ORAL | Status: DC | PRN
  Administered 2017-01-29 – 2017-02-01 (×5): 0.5 mg via ORAL
  Filled 2017-01-29 (×5): qty 1

## 2017-01-29 MED ORDER — LORAZEPAM 2 MG/ML IJ SOLN: 1.0000 mg | Freq: Four times a day (QID) | INTRAMUSCULAR | Status: DC | PRN

## 2017-01-29 MED ORDER — ATORVASTATIN CALCIUM 20 MG PO TABS
20.0000 mg | ORAL_TABLET | Freq: Every day | ORAL | Status: DC
  Administered 2017-01-29 – 2017-02-01 (×4): 20 mg via ORAL
  Filled 2017-01-29 (×4): qty 1

## 2017-01-29 MED ORDER — ASPIRIN EC 81 MG PO TBEC
81.0000 mg | DELAYED_RELEASE_TABLET | Freq: Every day | ORAL | Status: DC
  Administered 2017-01-30 – 2017-02-02 (×4): 81 mg via ORAL
  Filled 2017-01-29 (×4): qty 1

## 2017-01-29 MED ORDER — BISACODYL 10 MG RE SUPP: 10.0000 mg | Freq: Every day | RECTAL | Status: DC | PRN

## 2017-01-29 MED ORDER — FOLIC ACID 1 MG PO TABS
1.0000 mg | ORAL_TABLET | Freq: Every day | ORAL | Status: DC
  Administered 2017-01-30 – 2017-02-02 (×4): 1 mg via ORAL
  Filled 2017-01-29 (×4): qty 1

## 2017-01-29 NOTE — Telephone Encounter (Signed)
Please advise 

## 2017-01-29 NOTE — ED Notes (Signed)
Checked patient blood sugar it was 90 notified RN of blood sugar

## 2017-01-29 NOTE — ED Triage Notes (Signed)
Patient states SOB and mild chest pain on excertion only x 2-3 days. Patient states no pain or SOB when not moving. Patient has slept in chair for last few nights due to being too tired when he walks up stairs.

## 2017-01-29 NOTE — ED Notes (Signed)
ED Provider at bedside. 

## 2017-01-29 NOTE — Telephone Encounter (Signed)
FYI

## 2017-01-29 NOTE — ED Notes (Signed)
Attempted report 

## 2017-01-29 NOTE — ED Notes (Addendum)
MD notified of troponin results.

## 2017-01-29 NOTE — ED Notes (Signed)
Patient transported to X-ray 

## 2017-01-29 NOTE — Consult Note (Signed)
Cardiology Consultation:   Patient ID: MYKLE PASCUA; [id number]; Oct 02, 1950   Admit date: 01/29/2017 Date of Consult: 01/29/2017  Primary Care Provider: Mosie Lukes, MD Primary Cardiologist: Virl Axe, MD   Patient Profile:   ZAQUAN DUFFNER is a 67 y.o. male with a hx of CAD with prior CABG 2009, ICM (EF 15% by echo 2015, 25% in 09/2016) with S-ICD 2015, PAF, prior aflutter ablation 2014, HTN, HLD, syncope in 02/2014, LBBB who is being seen today for the evaluation of atrial fibrillation at the request of Dr. Lorin Mercy.  History of Present Illness:   Mr. Ramson was in his usual state of health until about 3 days ago. He was able to work on Tuesday. He works outside in a very physical job. He developed some shortness of breath and mild chest pressure. The dyspnea has progressively worsenened with dyspnea and PND. He tells me one time that he has had no chest discomfort then admitted that this is what prompted him to come to the ED. He says that the chest discomfort is very mild pressure and is relatively constant. He says this is common for him. The dyspnea is new. He denies edema but has 1+ pitting edema to mid calf. He denies cough or recent illness.   There is a phone note that states that his wife called into the office to report his shortness of breath and edema and also said that the patient had not been eating or drinking for 3 days and was not taking his Xarelto due to cost. Mr. Harr denies any knowledge of this and says that he is taking his Xarelto with last dose last night. Upon my assessment the patient is sitting up leaning on his knees to assist with breathing.   Mr. Noxon continues to smoke vaping that he reports to be equivalent to about 1/2 PPD. He says that the nicotine level is lower than cigarettes in the brand he uses. He drinks 1-2 glasses of red wine per day.    Mr. Resende has a history of CAD s/p CABG in 2009. Cath in 07/2013 showed 3 out of 5 grafts patent.  She had elevated troponin of 2.01 in 07/2013.   Mr Space had  sudden unwitnessed syncopal episode in 2/206 thought to be orthostatic vs VT below detection threshold of device. Also aldactone was discontinued in the past due to hyperkalemia.   Last office visit was on 07/29/2016 with Dr. [name] and was maintaining sinus rhythm on coreg and pt anticoagulated with Xarelto.    Past Medical History:  Diagnosis Date  . Abnormal liver function 08/23/2010  . Anxiety   . Arthritis   . CAD (coronary artery disease)    a. s/p CABG 2009. b. Cath 07/2013: 3/5 patent grafts (appear to have chronic occ grafts), mod diag/L PL branch, did not appear flow limiting, elevated filling pressures.  . CHF (congestive heart failure) ([place]) 07/30/2013  . Chicken pox as a child  . Chronic combined systolic and diastolic CHF (congestive heart failure) ([place])    a. Echo 6/14: Mild LVH, EF 30-35%, diffuse HK, MAC, mild BAE. b. Drop in EF to 20% by echo 07/2013.  . Diabetes mellitus ([place])   . Hyperlipidemia   . Hypertension   . LBBB (left bundle branch block)   . Low back pain   . Measles as a child  . Mumps as a child  . Myocardial infarction ([place])   . Paroxysmal atrial flutter ([place]) 07-2012   a.  s/p ablation by Dr [name] 08-06-2012. b. Recurrence 07/2013.   Mar[facility] 12/10/2014  . S/P colonoscopy   . Tobacco user     Past Surgical History:  Procedure Laterality Date  . ABLATION OF DYSRHYTHMIC FOCUS  08/06/2012   CTI ablation by Dr [name]  . ATRIAL FLUTTER ABLATION N/A 08/06/2012   Procedure: ATRIAL FLUTTER ABLATION;  Surgeon: Deboraha Sprang, MD;  Location: [facility] CATH LAB;  Service: Cardiovascular;  Laterality: N/A;  . CARDIAC CATHETERIZATION    . CARDIOVERSION N/A 07/15/2012   Procedure: CARDIOVERSION;  Surgeon: Thayer Headings, MD;  Location: [facility];  Service: Cardiovascular;  Laterality: N/A;  . CORONARY ARTERY BYPASS GRAFT  2009   x 5  . IMPLANTABLE CARDIOVERTER DEFIBRILLATOR IMPLANT N/A  11/10/2013   Procedure: SUB Q IMPLANTABLE CARDIOVERTER DEFIBRILLATOR IMPLANT;  Surgeon: Deboraha Sprang, MD;  Location[facility] CATH LAB;  Service: Cardiovascular;  Laterality: N/A;  . LEFT HEART CATHETERIZATION WITH CORONARY ANGIOGRAM N/A 08/01/2013   Procedure: LEFT HEART CATHETERIZATION WITH CORONARY ANGIOGRAM;  Surgeon: Burnell Blanks, MD;  Location: [facility] CATH LAB;  Service: Cardiovascular;  Laterality: N/A;  . POCKET REVISION N/A 12/16/2013   Procedure: POCKET REVISION;  Surgeon: Evans Lance, MD;  Location: [facility] CATH LAB;  Service: Cardiovascular;  Laterality: N/A;  . SHOULDER SURGERY    . TEE WITHOUT CARDIOVERSION N/A 07/15/2012   Procedure: TRANSESOPHAGEAL ECHOCARDIOGRAM (TEE);  Surgeon: Thayer Headings, MD;  Location: [place];  Service: Cardiovascular;  Laterality: N/A;  . TONSILLECTOMY       Home Medications:  Prior to Admission medications   Medication Sig Start Date End Date Taking? Authorizing Provider  albuterol (PROVENTIL HFA;VENTOLIN HFA) 108 (90 BASE) MCG/ACT inhaler Inhale 2 puffs into the lungs every 6 (six) hours as needed for wheezing or shortness of breath. 12/05/14  Yes Mosie Lukes, MD  atorvastatin (LIPITOR) 20 MG tablet TAKE 1 TABLET (20 MG TOTAL) BY MOUTH DAILY. 01/07/17  Yes Mosie Lukes, MD  busPIRone (BUSPAR) 10 MG tablet TAKE 1 TABLET BY MOUTH TWICE A DAY 02/25/16  Yes Mosie Lukes, MD  carvedilol (COREG) 12.5 MG tablet TAKE 1 TABLET BY MOUTH TWICE DAILY WITH A MEAL 12/08/16  Yes Deboraha Sprang, MD  clonazePAM (KLONOPIN) 0.5 MG tablet Take 1 tablet (0.5 mg total) by mouth 3 (three) times daily as needed. Patient taking differently: Take 0.5 mg by mouth 3 (three) times daily as needed for anxiety.  07/10/16  Yes Mosie Lukes, MD  furosemide (LASIX) 40 MG tablet TAKE 1 TABLET BY MOUTH DAILY. IF WEIGHT INCREASED > 3LBS OVERNIGHT INCREASE BY ONE TABLET 12/08/16  Yes Deboraha Sprang, MD  glipiZIDE (GLUCOTROL) 5 MG tablet TAKE 1/2 TABLET (2.5 MG TOTAL) BY  MOUTH ONCE A DAY BEFORE BREAKFAST Patient taking differently: 2.5MG BY MOUTH ONCE DAILY BEFORE BREAKFAST 09/16/16  Yes Mosie Lukes, MD  irbesartan (AVAPRO) 150 MG tablet TAKE 1 TABLET (150 MG TOTAL) BY MOUTH DAILY. 09/01/16  Yes Deboraha Sprang, MD  metFORMIN (GLUCOPHAGE) 500 MG tablet TAKE 1 TABLET BY MOUTH TWICE DAILY WITH A MEAL 01/09/17  Yes Mosie Lukes, MD  rivaroxaban (XARELTO) 20 MG TABS tablet TAKE 1 TABLET BY MOUTH EVERY DAY WITH SUPPER 09/26/16  Yes Deboraha Sprang, MD    Inpatient Medications: Scheduled Meds: . aspirin EC  81 mg Oral Daily  . atorvastatin  20 mg Oral q1800  . busPIRone  10 mg Oral BID  . carvedilol  12.5  mg Oral BID WC  . furosemide  40 mg Intravenous BID  . insulin aspart  0-9 Units Subcutaneous TID WC  . irbesartan  150 mg Oral Daily  . rivaroxaban  20 mg Oral Q supper   Continuous Infusions:  PRN Meds: acetaminophen **OR** acetaminophen, albuterol, bisacodyl, clonazePAM, HYDROcodone-acetaminophen, ondansetron **OR** ondansetron (ZOFRAN) IV, senna-docusate  Allergies:   No Known Allergies  Social History:   Social History   Socioeconomic History  . Marital status: Married    Spouse name: Not on file  . Number of children: Not on file  . Years of education: Not on file  . Highest education level: Not on file  Social Needs  . Financial resource strain: Not on file  . Food insecurity - worry: Not on file  . Food insecurity - inability: Not on file  . Transportation needs - medical: Not on file  . Transportation needs - non-medical: Not on file  Occupational History  . Not on file  Tobacco Use  . Smoking status: Former Smoker    Packs/day: 1.00    Years: 40.00    Pack years: 40.00    Types: Cigarettes    Last attempt to quit: 02/01/2011    Years since quitting: 5.9  . Smokeless tobacco: Never Used  Substance and Sexual Activity  . Alcohol use: Yes    Alcohol/week: 1.2 oz    Types: 2 Glasses of wine per week    Comment: drinks 2  bottles of wine per week  . Drug use: No  . Sexual activity: Yes    Birth control/protection: Coitus interruptus  Other Topics Concern  . 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   He has been married four times previously, the first  For 66yr and has two children by the marriage which are grown, the second time for 11 yrs and has 2 by that marriage.   Wife has custody in [place]   The third marriage was 4 1/2 yrs and most recent marriage was the past eight weeks and he is currently estranged from that person.    Family History:    Family History  Problem Relation Age of Onset  . Alzheimer's disease Mother   . Heart failure Father 628 . Hypertension Father   . Hyperlipidemia Father   . Cancer Father        lung- took half of a young- smoker  . Heart disease Father        MI at 560 . Leukemia Brother   . Drug abuse Daughter        heroine  . Early death Neg Hx   . Kidney disease Neg Hx   . Stroke Neg Hx      ROS:  Please see the history of present illness.  ROS  All other ROS reviewed and negative.     Physical Exam/Data:   Vitals:   01/29/17 1215 01/29/17 1230 01/29/17 1245 01/29/17 1300  BP: (!) 126/99 (!) 145/96 125/82 (!) 142/103  Pulse: 90 91 89 85  Resp: (!) 30 19 (!) 39 (!) 22  Temp:      TempSrc:      SpO2: 96% 97% 96% 97%  Weight:      Height:       No intake or output data in the 24 hours ending 01/29/17  [place]   01/29/17 0900  Weight: 198 lb (89.8 kg)   Body mass index is 25.42 kg/m.  General:  Well nourished, well developed, in no acute distress HEENT: normal Lymph: no adenopathy Neck: 1 cm JVD Endocrine:  No thryomegaly Vascular: No carotid bruits; FA pulses 2+ bilaterally without bruits  Cardiac:  normal S1, S2; Irregularly irregular rhythm; no murmur  Lungs:  clear to auscultation  bilaterally, no wheezing, rhonchi or rales  Abd: soft, nontender, no hepatomegaly  Ext: 1+ edema up to mid calf Musculoskeletal:  No deformities, BUE and BLE strength normal and equal Skin: warm and dry  Neuro:  CNs 2-12 intact, no focal abnormalities noted Psych:  Normal affect   EKG:  The EKG was personally reviewed and demonstrates:  Atrial fibrillation/flutter, 83 bpm, IVCD, consider atypical LBBB Telemetry:  Telemetry was personally reviewed and demonstrates:  Atrial fib in the 80's-90's  Relevant CV Studies:  Echocardiogram 10/03/2015 Study Conclusions - Left ventricle: The cavity size was normal. Wall thickness was   increased in a pattern of mild LVH. The estimated ejection   fraction was 25%. Diffuse hypokinesis. Septal-lateral   dyssynchrony. Features are consistent with a pseudonormal left   ventricular filling pattern, with concomitant abnormal relaxation   and increased filling pressure (grade 2 diastolic dysfunction). - Aortic valve: There was no stenosis. - Mitral valve: Mildly calcified annulus. There was trivial   regurgitation. - Left atrium: The atrium was mildly dilated. - Right ventricle: The cavity size was normal. Systolic function   was mildly reduced. - Pulmonary arteries: No complete TR doppler jet so unable to   estimate PA systolic pressure. - Inferior vena cava: The vessel was normal in size. The   respirophasic diameter changes were in the normal range (= 50%),   consistent with normal central venous pressure.  Impressions: - Normal LV size with mild LV hypertrophy. Septal-lateral   dyssynchrony consistent with bundle branch block. EF 25%, diffuse   hypokinesis. Moderate diastolic dysfunction. Normal RV size with   mildly decreased systolic function. No significant valvular   abnormalities.  Cardiac cath 08/01/2013 Impression: 1. Severe triple vessel CAD s/p 5V CABG with 3/5 patent bypass grafts. The graft to the left sided PL is occluded and the  graft to the diagonal is occluded (both appear to be chronic occlusions) 2. Moderate disease in diagonal and left sided posterolateral branch, does not appear to be flow limiting.  3. Elevated filling pressures  Recommendations: Continue medical management of CAD. He appears to be volume overloaded. This is most likely contributing to his dyspnea and hypoxia. Continue diuresis with IV Lasix.           Laboratory Data:  Chemistry Recent Labs  Lab 01/28/17 1134 01/29/17 0907  NA 134* 133*  K 4.7 4.1  CL 99 101  CO2 29 22  GLUCOSE 85 92  BUN 12 12  CREATININE 0.91 0.86  CALCIUM 9.0 9.1  GFRNONAA  --  >60  GFRAA  --  >60  ANIONGAP  --  10    Recent Labs  Lab 01/28/17 1134  PROT 6.5  ALBUMIN 3.8  AST 15  ALT 12  ALKPHOS 62  BILITOT 2.4*   Hematology Recent Labs  Lab 01/28/17 1134 01/29/17 0907  WBC 7.0 8.3  RBC 4.88 4.66  HGB 15.2 14.5  HCT 46.8 43.9  MCV 96.0 94.2  MCH  --  31.1  MCHC 32.4 33.0  RDW 16.3* 14.5  PLT 166.0 172  Cardiac Enzymes Recent Labs  Lab 01/29/17 1116  TROPONINI 0.46*    Recent Labs  Lab 01/29/17 0919  TROPIPOC 0.23*    BNP Recent Labs  Lab 01/28/17 1134 01/29/17 0907  BNP  --  1,151.4*  PROBNP 1,187.0*  --     DDimer  Recent Labs  Lab 01/29/17 0907  DDIMER 1.03*    Radiology/Studies:  Dg Chest 2 View  Result Date: 01/29/2017 CLINICAL DATA:  Short of breath EXAM: CHEST  2 VIEW COMPARISON:  01/28/2017 FINDINGS: CABG. Electrical stimulator overlying the sternum in the subcutaneous tissues unchanged Cardiac enlargement with mild vascular congestion. Negative for pulmonary edema. Small bilateral effusions unchanged. Bibasilar atelectasis unchanged IMPRESSION: Pulmonary vascular congestion and small pleural effusions unchanged. Negative for edema. Electronically Signed   By: Franchot Gallo M.D.   On: 01/29/2017 09:44   Dg Chest 2 View  Result Date: 01/28/2017 CLINICAL DATA:  Occasional shortness of breath, smoking  history EXAM: CHEST  2 VIEW COMPARISON:  Chest x-ray of 01/16/2015 FINDINGS: There is little change in mild cardiomegaly. However there are now small bilateral pleural effusions present with pulmonary vascular congestion, indicative of mild CHF. Median sternotomy sutures are noted from prior CABG. A single AICD lead overlies the anterior chest. There are degenerative changes in the thoracic spine IMPRESSION: Probable mild CHF with cardiomegaly, small pleural effusions, and mild pulmonary vascular congestion. Electronically Signed   By: Ivar Drape M.D.   On: 01/28/2017 16:38    Assessment and Plan:   1. Atrial fibrillation -Hx aflutter ablation 2014. Was maintaining sinus rhythm at last office appt in 07/2016. -Currently in afib/flutter  -CHA2DS2/VAS Stroke Risk Score 5 (CHF, CAD, HTN, Age, DM). Pt on Xarelto for stroke risk reduction.  Hgb 14.5.  -Apparently pt has not been taking some or all of his medications including the Xarelto. -Being in afib may be causing his CHF exacerbation. Will continue current BB. Get him back on his Xarelto. Hopefully his rhythm will improve on his meds.  -Unable to do DCCV at present due to not taking his anticoagulation for at least a few days.    2. Elevated troponin -Troponins  0.23, 0.46, 0.42 -May be demand ischemia related to heart failure and atrial fib, but pt has hx of CAD with CABG and occluded 3/5 grafts (chronic occlusions by cath 07/2013) and non flow limiting moderate disease in diagonal and left PL branch.  -Troponin elevation in 07/2013 to 2.01 was thought to be related volume overload.  -Will try to remove some fluid and follow his symptoms. If his symptoms resolve with improved fluid status, will not likely  Not pursue any further workup. If pt continues to have chest discomfort with improved fluid status may need diagnostic cath to assess coronary arteries and grafts.    3. Acute on chronic systolic and diastolic heart failure -Pt likely has not  taken his meds and is now in afib.  -BNP 1187 -CXR showed pulmonary vascular congestion and small pleural effusions. Negative for edema.  -Treated with Lasix, carvedilol, ARB as outpatient- not taking, unsure how long.  -Admission wt is 198. Wts in office: 192 in July, 198 in May.  -Ordered lasix 40 mg IV BID. None given yet.  -Will diurese and reassess symptoms.   4. CAD -Hx CAD s/p CABG in 2009. Cath in 07/2013 showed 3 out of 5 grafts patent.  -On BB, statin, no aspirin as it on anticoagulation, Although pt not taking his meds  5. Hypertension -Treated with  ARB, BB, lasix -BP mildly elevated. Will diurese and get back on his meds.  6. Hyperlipidemia -Treated with atorvastatin 20 mg daily. Last LDL in Epic was 62 in 11/2015. Was at goal of <70.   7. diabetes type 2 not on insulin -Metformin on hold while hospitalized. -Well controlled with Hgb A1c 4.8   For questions or updates, please contact [place] Please consult [email or web address] for contact info under Cardiology/STEMI.   Signed, Daune Perch, NP  01/29/2017 70:59 PM   67 year old male with ischemic cardiomyopathy, coronary artery disease with 3 out of 5 grafts patent on last cardiac cath, EF ranging from 15-25% with subcutaneous defibrillator placement in 2015, diabetes, hypertension, hyperlipidemia, medication noncompliance, paroxysmal atrial fibrillation with noncompliance on Xarelto here with symptoms of early satiety, orthopnea, chest discomfort and elevated troponin of 0.4.  Currently chest pain-free but short of breath.  Challenging to get a clear story from him.  When I asked him if he was taking his Xarelto he stated that he was not taking any of his medicines.  Exam: Sitting up in bed, jawline JVD, irregularly irregular rhythm with heart rate in the 80s-90s, subcutaneous defibrillator left flank, 1+ edema mid calf  Labs: Troponin elevated 0.4 EKG: Atrial fibrillation heart rate 83 with left bundle branch  block  Assessment and plan:  Paroxysmal atrial fibrillation -This may have tipped him over into symptoms of heart failure.  Currently rate controlled.  Resume Xarelto.  If his symptoms persist after adequate diuresis, we will consider TEE cardioversion to help him resume his atrial kick.  Past clinic visit was in sinus rhythm.  Understands stroke risks.  Acute on chronic systolic heart failure with ischemic cardia myopathy, dilated cardiomyopathy -We will give him IV Lasix 40 mg twice a day.  Goal diuresis is 1-2 L a day.  Maintain a watch on electrolytes.  In the past he has had hyperkalemia with spironolactone.  We will avoid.  There is some question about medication compliance.  If he deteriorates after using carvedilol, we may need to discontinue until euvolemic.  We shall see.  Seems hypertensive at this point.  Should be able to tolerate.  Elevated troponin/demand ischemia -Troponin is increased to 0.4.  This was previously seen at a prior hospitalization where his troponin went up to 2.  He was cathed.  3 out of 5 grafts were patent.  Medical management ensued.  This is likely a similar scenario where he is having troponin elevation in the setting of acute systolic heart failure.  He did however state that he had some chest discomfort at one point.  Continue to trend troponin.  If necessary, we can always proceed with cardiac catheterization.  We would have to hold Xarelto at that point.  Subcutaneous ICD -Implanted in 2015, [employer], Dr. [name].  Likely implanted because of multiple skin excoriations noted.  He denies psoriasis.  Candee Furbish, MD

## 2017-01-29 NOTE — H&P (Signed)
History and Physical    [name] LHT:[id number] DOB: [birth year] DOA: 01/29/2017   PCP: Mosie Lukes, MD   Patient coming from:  Home    Chief Complaint: Shortness of Breath  HPI: Larry Rangel is a 67 y.o. male with medical history significant for diabetes, hypertension, anxiety, sleep apnea, PAF on Xarelto, history of CHF,  brought via EMS from home, for evaluation of 3 or 5-day history of progressive shortness of breath, especially walking upstairs, now even lying down.  The patient sleeps in the recliner, sometimes finding himself upright, leaning forward to help with his breathing.  He denies any significant cough, or mucus production, occasionally she has some wheezing.  He denies any chest pain, or palpitations.  He denies any fever, or chills.  He denies any lightheadedness, dizziness, vertigo, syncope or presyncope.  He denies any hemoptysis.  He denies any abdominal pain, nausea or vomiting, he noticed a couple of pounds of increased weight gain.  He denies any recent long distance trips.  He denies any history of PE or DVT.  He continues to smoke.  He denies any significant amount of alcohol, or recreational drug use.  He denies any recent hospitalization, has been compliant with his meds, however, on last December, he stopped his Xarelto dose for 1 week, due to cost, but resumed the medication on January 1.  ED Course:  BP 140/78   Pulse 86   Temp 98.6 F (37 C) (Oral)   Resp 20   Ht _0  (1.88 m)   Wt 89.8 kg (198 lb)   SpO2 97%   BMI 25.42 kg/m   Troponin  0.23 White count 8.3, hemoglobin 14.5, platelets 172 Chest x-ray with vascular congestion, small pleural effusion, essentially unchanged from prior. Sodium 134 otherwise normal Creatinine 0.91 Glu  85, hemoglobin A1c 5.1 20 November 2015. EKG atrial fibrillation without any changes from prior. Weight 196 pounds as of 116, today 198   Review of Systems:  As per HPI otherwise all other systems reviewed  and are negative  Past Medical History:  Diagnosis Date  . Abnormal liver function 08/23/2010  . Anxiety   . Arthritis   . CAD (coronary artery disease)    a. s/p CABG 2009. b. Cath 07/2013: 3/5 patent grafts (appear to have chronic occ grafts), mod diag/L PL branch, did not appear flow limiting, elevated filling pressures.  . CHF (congestive heart failure) ([place]) 07/30/2013  . Chicken pox as a child  . Chronic combined systolic and diastolic CHF (congestive heart failure) ([place])    a. Echo 6/14: Mild LVH, EF 30-35%, diffuse HK, MAC, mild BAE. b. Drop in EF to 20% by echo 07/2013.  . Diabetes mellitus ([place])   . Hyperlipidemia   . Hypertension   . LBBB (left bundle branch block)   . Low back pain   . Measles as a child  . Mumps as a child  . Myocardial infarction ([place])   . Paroxysmal atrial flutter ([place]) 07-2012   a. s/p ablation by Dr [name] 08-06-2012. b. Recurrence 07/2013.   [facility] 12/10/2014  . S/P colonoscopy   . Tobacco user     Past Surgical History:  Procedure Laterality Date  . ABLATION OF DYSRHYTHMIC FOCUS  08/06/2012   CTI ablation by Dr [name]  . ATRIAL FLUTTER ABLATION N/A 08/06/2012   Procedure: ATRIAL FLUTTER ABLATION;  Surgeon: Deboraha Sprang, MD;  Location: [facility] CATH LAB;  Service: Cardiovascular;  Laterality: N/A;  .  CARDIAC CATHETERIZATION    . CARDIOVERSION N/A 07/15/2012   Procedure: CARDIOVERSION;  Surgeon: Thayer Headings, MD;  Location: [facility];  Service: Cardiovascular;  Laterality: N/A;  . CORONARY ARTERY BYPASS GRAFT  2009   x 5  . IMPLANTABLE CARDIOVERTER DEFIBRILLATOR IMPLANT N/A 11/10/2013   Procedure: SUB Q IMPLANTABLE CARDIOVERTER DEFIBRILLATOR IMPLANT;  Surgeon: Deboraha Sprang, MD;  Location: [facility] CATH LAB;  Service: Cardiovascular;  Laterality: N/A;  . LEFT HEART CATHETERIZATION WITH CORONARY ANGIOGRAM N/A 08/01/2013   Procedure: LEFT HEART CATHETERIZATION WITH CORONARY ANGIOGRAM;  Surgeon: Burnell Blanks, MD;  Location: [facility]  CATH LAB;  Service: Cardiovascular;  Laterality: N/A;  . POCKET REVISION N/A 12/16/2013   Procedure: POCKET REVISION;  Surgeon: Evans Lance, MD;  Location: [facility] CATH LAB;  Service: Cardiovascular;  Laterality: N/A;  . SHOULDER SURGERY    . TEE WITHOUT CARDIOVERSION N/A 07/15/2012   Procedure: TRANSESOPHAGEAL ECHOCARDIOGRAM (TEE);  Surgeon: Thayer Headings, MD;  Location: [facility];  Service: Cardiovascular;  Laterality: N/A;  . TONSILLECTOMY      Social History Social History   Socioeconomic History  . Marital status: Married    Spouse name: Not on file  . Number of children: Not on file  . Years of education: Not on file  . Highest education level: Not on file  Social Needs  . Financial resource strain: Not on file  . Food insecurity - worry: Not on file  . Food insecurity - inability: Not on file  . Transportation needs - medical: Not on file  . Transportation needs - non-medical: Not on file  Occupational History  . Not on file  Tobacco Use  . Smoking status: Former Smoker    Packs/day: 1.00    Years: 40.00    Pack years: 40.00    Types: Cigarettes    Last attempt to quit: 02/01/2011    Years since quitting: 5.9  . Smokeless tobacco: Never Used  Substance and Sexual Activity  . Alcohol use: Yes    Alcohol/week: 1.2 oz    Types: 2 Glasses of wine per week    Comment: drinks 2 bottles of wine per week  . Drug use: No  . Sexual activity: Yes    Birth control/protection: Coitus interruptus  Other Topics Concern  . 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   He has been married four times previously, the first  For 25yr and has two children by the marriage which are grown, the second time for 11 yrs and has 2 by that marriage.   Wife has custody in [place]   The third marriage was 4 1/2 yrs and most recent marriage was the past  eight weeks and he is currently estranged from that person.     No Known Allergies  Family History  Problem Relation Age of Onset  . Alzheimer's disease Mother   . Heart failure Father 63 . Hypertension Father   . Hyperlipidemia Father   . Cancer Father        lung- took half of a young- smoker  . Heart disease Father        MI at 569 . Leukemia Brother   . Drug abuse Daughter        heroine  . Early death Neg Hx   . Kidney disease Neg Hx   .  Stroke Neg Hx       Prior to Admission medications   Medication Sig Start Date End Date Taking? Authorizing Provider  albuterol (PROVENTIL HFA;VENTOLIN HFA) 108 (90 BASE) MCG/ACT inhaler Inhale 2 puffs into the lungs every 6 (six) hours as needed for wheezing or shortness of breath. 12/05/14   Mosie Lukes, MD  atorvastatin (LIPITOR) 20 MG tablet TAKE 1 TABLET (20 MG TOTAL) BY MOUTH DAILY. 01/07/17   Mosie Lukes, MD  busPIRone (BUSPAR) 10 MG tablet TAKE 1 TABLET BY MOUTH TWICE A DAY 02/25/16   Mosie Lukes, MD  carvedilol (COREG) 12.5 MG tablet TAKE 1 TABLET BY MOUTH TWICE DAILY WITH A MEAL 12/08/16   Deboraha Sprang, MD  clonazePAM (KLONOPIN) 0.5 MG tablet Take 1 tablet (0.5 mg total) by mouth 3 (three) times daily as needed. 07/10/16   Mosie Lukes, MD  furosemide (LASIX) 40 MG tablet TAKE 1 TABLET BY MOUTH DAILY. IF WEIGHT INCREASED > 3LBS OVERNIGHT INCREASE BY ONE TABLET 12/08/16   Deboraha Sprang, MD  glipiZIDE (GLUCOTROL) 5 MG tablet TAKE 1/2 TABLET (2.5 MG TOTAL) BY MOUTH ONCE A DAY BEFORE BREAKFAST 09/16/16   Mosie Lukes, MD  irbesartan (AVAPRO) 150 MG tablet TAKE 1 TABLET (150 MG TOTAL) BY MOUTH DAILY. 09/01/16   Deboraha Sprang, MD  metFORMIN (GLUCOPHAGE) 500 MG tablet TAKE 1 TABLET BY MOUTH TWICE DAILY WITH A MEAL 01/09/17   Mosie Lukes, MD  OVER THE COUNTER MEDICATION Place 1 drop into both eyes as needed (for dry eyes). "OTC Good Sense Eye Drops"    [provider]  rivaroxaban (XARELTO) 20 MG TABS  tablet TAKE 1 TABLET BY MOUTH EVERY DAY WITH SUPPER 09/26/16   Deboraha Sprang, MD    Physical Exam:  Vitals:   01/29/17 0945 01/29/17 1000 01/29/17 1015 01/29/17 1030  BP: (!) 141/92 123/90 (!) 148/91 140/78  Pulse: 75 81 84 86  Resp: 14 18 (!) 22 20  Temp:      TempSrc:      SpO2: 97% 97% 97% 97%  Weight:      Height:       Constitutional: NAD, calm, comfortable, looks younger than his stated age.   Eyes: PERRL, lids and conjunctivae normal ENMT: Mucous membranes are moist, without exudate or lesions  Neck: normal, supple, no masses, no thyromegaly.  No JVD noted Respiratory: Essentially clear to auscultation bilaterally, for a few crackles at the bases.  No wheezing or rhonchi  normal respiratory effort  Cardiovascular: Irregularly irregular rate and rhythm,  murmur, rubs or gallops. LLE>RLW 1 +  extremity edema. 2+ pedal pulses. No carotid bruits.  Abdomen: Soft, non tender, No hepatosplenomegaly. Bowel sounds positive.  Musculoskeletal: no clubbing / cyanosis. Moves all extremities Skin: no jaundice, No lesions. Multiple nevi Neurologic: Sensation intact  Strength equal in all extremities Psychiatric:   Alert and oriented x 3. Normal mood.     Labs on Admission: I have personally reviewed following labs and imaging studies  CBC: Recent Labs  Lab 01/28/17 1134 01/29/17 0907  WBC 7.0 8.3  NEUTROABS 5.4 5.9  HGB 15.2 14.5  HCT 46.8 43.9  MCV 96.0 94.2  PLT 166.0 732    Basic Metabolic Panel: Recent Labs  Lab 01/28/17 1134 01/29/17 0907  NA 134* 133*  K 4.7 4.1  CL 99 101  CO2 29 22  GLUCOSE 85 92  BUN 12 12  CREATININE 0.91 0.86  CALCIUM 9.0 9.1  GFR: Estimated Creatinine Clearance: 95.6 mL/min (by C-G formula based on SCr of 0.86 mg/dL).  Liver Function Tests: Recent Labs  Lab 01/28/17 1134  AST 15  ALT 12  ALKPHOS 62  BILITOT 2.4*  PROT 6.5  ALBUMIN 3.8   No results for input(s): LIPASE, AMYLASE in the last 168 hours. No results for  input(s): AMMONIA in the last 168 hours.  Coagulation Profile: No results for input(s): INR, PROTIME in the last 168 hours.  Cardiac Enzymes: No results for input(s): CKTOTAL, CKMB, CKMBINDEX, TROPONINI in the last 168 hours.  BNP (last 3 results) Recent Labs    01/28/17 1134  PROBNP 1,187.0*    HbA1C: No results for input(s): HGBA1C in the last 72 hours.  CBG: No results for input(s): GLUCAP in the last 168 hours.  Lipid Profile: No results for input(s): CHOL, HDL, LDLCALC, TRIG, CHOLHDL, LDLDIRECT in the last 72 hours.  Thyroid Function Tests: No results for input(s): TSH, T4TOTAL, FREET4, T3FREE, THYROIDAB in the last 72 hours.  Anemia Panel: No results for input(s): VITAMINB12, FOLATE, FERRITIN, TIBC, IRON, RETICCTPCT in the last 72 hours.  Urine analysis:    Component Value Date/Time   COLORURINE YELLOW 02/13/2014 1441   APPEARANCEUR CLEAR 02/13/2014 1441   LABSPEC 1.015 02/13/2014 1441   PHURINE 5.0 02/13/2014 1441   GLUCOSEU 100 (A) 02/13/2014 1441   GLUCOSEU NEGATIVE 11/12/2012 1648   HGBUR NEGATIVE 02/13/2014 1441   BILIRUBINUR NEGATIVE 02/13/2014 1441   KETONESUR NEGATIVE 02/13/2014 1441   PROTEINUR NEGATIVE 02/13/2014 1441   UROBILINOGEN 0.2 02/13/2014 1441   NITRITE NEGATIVE 02/13/2014 1441   LEUKOCYTESUR NEGATIVE 02/13/2014 1441    Sepsis Labs: _0 (procalcitonin:4,lacticidven:4) )No results found for this or any previous visit (from the past 240 hour(s)).   Radiological Exams on Admission: Dg Chest 2 View  Result Date: 01/29/2017 CLINICAL DATA:  Short of breath EXAM: CHEST  2 VIEW COMPARISON:  01/28/2017 FINDINGS: CABG. Electrical stimulator overlying the sternum in the subcutaneous tissues unchanged Cardiac enlargement with mild vascular congestion. Negative for pulmonary edema. Small bilateral effusions unchanged. Bibasilar atelectasis unchanged IMPRESSION: Pulmonary vascular congestion and small pleural effusions unchanged. Negative for  edema. Electronically Signed   By: Franchot Gallo M.D.   On: 01/29/2017 09:44   Dg Chest 2 View  Result Date: 01/28/2017 CLINICAL DATA:  Occasional shortness of breath, smoking history EXAM: CHEST  2 VIEW COMPARISON:  Chest x-ray of 01/16/2015 FINDINGS: There is little change in mild cardiomegaly. However there are now small bilateral pleural effusions present with pulmonary vascular congestion, indicative of mild CHF. Median sternotomy sutures are noted from prior CABG. A single AICD lead overlies the anterior chest. There are degenerative changes in the thoracic spine IMPRESSION: Probable mild CHF with cardiomegaly, small pleural effusions, and mild pulmonary vascular congestion. Electronically Signed   By: Ivar Drape M.D.   On: 01/28/2017 16:38    EKG: Independently reviewed.  Assessment/Plan Active Problems:   Acute on chronic combined systolic and diastolic CHF (congestive heart failure) (HCC)   Hyperlipidemia   Essential hypertension   Diabetes mellitus type 2 in obese (HCC)   OSA (obstructive sleep apnea)   Anxiety state   CHF (congestive heart failure) (HCC)   Paroxysmal atrial fibrillation (HCC)   LBBB (left bundle branch block)   Ischemic cardiomyopathy   Acute on chronic combined systolic (congestive) and diastolic (congestive) heart failure (HCC)   Acute on chronic  combined systolic and diastolic CHF in a patient with a history of ICM and atrial  fibrillation, presenting with increasing shortness of breath over the last 3 or 4 days, Has gained about 2 pounds since his last weight 24 hours ago.  Troponin 0 0.23.  X-ray shows vascular congestion, with small pleural effusion, EKG with atrial fibrillation, but without any changes from prior.  BNP 1100.  Receiving Lasix x1 at the ED.  Last 2D echo in 2017 showed EF 25%, ICM, grade 2 diastolic, mildly decreased systolic, moderate decrease diastolic. Admit to inpatient telemetry IV Lasix 40 mg bid IV Continue Coreg,  monitor I/Os and  daily weights prn 02 2 D echo Check TSH Cycle troponin due to elevation  Cardiology to see, appreciate their consultation     Atrial Fibrillation CHA2DS2-VASc score 5 , on anticoagulation with Xarelto. He missed 1 week of dose in the last week of December, resume in January 1.  He is compliant with the meds.  There is low suspicion for PE.  However, the EDP ordered a d-dimer, which currently is pending.  Rate controlled Continue Xarelto   Hypertension BP 145/101   Pulse 87   Controlled Continue home anti-hypertensive medications    Hyperlipidemia Continue home statins  Tobacco abuse without nicotine withdrawal Nicotine offered, patient declined Counseled cessation   OSA Continue CPAP nightly Continue inhalers.  Type II Diabetes Current blood sugar level is 92  hemoglobin A1c 5.1 20 November 2015. Lab Results  Component Value Date   HGBA1C 5.1 12/11/2015   Hgb A1C Hold home oral diabetic medications.  SSI   Anxiety Continue home Buspar and Klonopin    DVT prophylaxis: Xarelto Code Status:    Full Family Communication:  Discussed with patient wife Disposition Plan: Expect patient to be discharged to home after condition improves Consults called:    Cardiology, per EDP. Admission status: Inpatient telemetry   Sharene Butters, [facility]   Amion text  [phone]   01/29/2017, 10:56 AM

## 2017-01-29 NOTE — ED Provider Notes (Signed)
[facility] DEPARTMENT Provider Note   CSN: [id number] Arrival date & time: 01/29/17  0854     History   Chief Complaint Chief Complaint  Patient presents with  . Shortness of Breath    HPI Larry Rangel is a 67 y.o. male.  HPI   67 year old male with history of CHF, diabetes, hypertension, paroxysmal atrial fibrillation currently on Xarelto, brought here via EMS from home for evaluation of shortness of breath.  History obtained through wife who is at bedside.  For the past 3-4 days patient has progressive worsening shortness of breath.  Shortness of breath brought on by exertion especially walking up the stairs or even with laying down.  He sleeps in a recliner and sometimes finding himself sitting upright leaning forward to help with his breathing.  Endorsed occasional cough similar to prior, states he occasional wheeze similar to prior.  In the nursing note it mentioned that patient was having some chest pain however patient denies having any pain at all. He denies having fever, chills, lightheadedness, dizziness, diaphoresis, hemoptysis, abdominal pain or back pain.  Patient does weigh himself is not on a regular basis but has not noticed any increased weight gain.  States he has been compliant with his medication.  Denies any recent hospitalization. Wife did report he did stop taking his Xarelto for about a week due to cost but resume the medication on Jan 1st.    Past Medical History:  Diagnosis Date  . Abnormal liver function 08/23/2010  . Anxiety   . Arthritis   . CAD (coronary artery disease)    a. s/p CABG 2009. b. Cath 07/2013: 3/5 patent grafts (appear to have chronic occ grafts), mod diag/L PL branch, did not appear flow limiting, elevated filling pressures.  . CHF (congestive heart failure) ([place]) 07/30/2013  . Chicken pox as a child  . Chronic combined systolic and diastolic CHF (congestive heart failure) ([place])    a. Echo 6/14: Mild LVH, EF  30-35%, diffuse HK, MAC, mild BAE. b. Drop in EF to 20% by echo 07/2013.  . Diabetes mellitus ([place])   . Hyperlipidemia   . Hypertension   . LBBB (left bundle branch block)   . Low back pain   . Measles as a child  . Mumps as a child  . Myocardial infarction ([place])   . PAF (paroxysmal atrial fibrillation) ([place])   . Paroxysmal atrial flutter ([place]) 07-2012   a. s/p ablation by Dr [name] 08-06-2012. b. Recurrence 07/2013.   Marland Kitchen Preventative health care 12/10/2014  . S/P colonoscopy   . Tobacco user     Patient Active Problem List   Diagnosis Date Noted  . Pulmonary emphysema ([place]) 04/17/2015  . Preventative health care 12/10/2014  . Hematoma of implantable cardioverter-defibrillator (ICD) pocket 12/16/2013  . Ischemic cardiomyopathy 11/10/2013  . LBBB (left bundle branch block) 08/15/2013  . CAD (coronary artery disease)   . Paroxysmal atrial fibrillation ([place]) 08/02/2013  . Demand ischemia ([place]) 08/02/2013  . CHF (congestive heart failure) ([place]) 07/30/2013  . Medicare welcome exam 06/12/2013  . Microalbuminuria 11/13/2012  . ANXIETY 11/13/2012  . OSA (obstructive sleep apnea) 07/22/2012  . Diabetes mellitus type 2 in obese ([place]) 03/17/2011  . Abnormal liver function 08/23/2010  . Essential hypertension 10/04/2009  . Hyperlipidemia 09/10/2009    Past Surgical History:  Procedure Laterality Date  . ABLATION OF DYSRHYTHMIC FOCUS  08/06/2012   CTI ablation by Dr [name]  . ATRIAL FLUTTER ABLATION N/A 08/06/2012  Procedure: ATRIAL FLUTTER ABLATION;  Surgeon: Deboraha Sprang, MD;  Location: [facility] C[facility] LAB;  Service: Cardiovascular;  Laterality: N/A;  . CARDIAC CATHETERIZATION    . CARDIOVERSION N/A 07/15/2012   Procedure: CARDIOVERSION;  Surgeon: Thayer Headings, MD;  Location: [facility];  Service: Cardiovascular;  Laterality: N/A;  . CORONARY ARTERY BYPASS GRAFT  2009   x 5  . IMPLANTABLE CARDIOVERTER DEFIBRILLATOR IMPLANT N/A 11/10/2013   Procedure: SUB Q IMPLANTABLE CARDIOVERTER  DEFIBRILLATOR IMPLANT;  Surgeon: Deboraha Sprang, MD;  Location: [facility] CATH LAB;  Service: Cardiovascular;  Laterality: N/A;  . LEFT HEART CATHETERIZATION WITH CORONARY ANGIOGRAM N/A 08/01/2013   Procedure: LEFT HEART CATHETERIZATION WITH CORONARY ANGIOGRAM;  Surgeon: Burnell Blanks, MD;  Location: [facility] CATH LAB;  Service: Cardiovascular;  Laterality: N/A;  . POCKET REVISION N/A 12/16/2013   Procedure: POCKET REVISION;  Surgeon: Evans Lance, MD;  Location: [facility] CATH LAB;  Service: Cardiovascular;  Laterality: N/A;  . SHOULDER SURGERY    . TEE WITHOUT CARDIOVERSION N/A 07/15/2012   Procedure: TRANSESOPHAGEAL ECHOCARDIOGRAM (TEE);  Surgeon: Thayer Headings, MD;  Location: [facility];  Service: Cardiovascular;  Laterality: N/A;  . TONSILLECTOMY         Home Medications    Prior to Admission medications   Medication Sig Start Date End Date Taking? Authorizing Provider  albuterol (PROVENTIL HFA;VENTOLIN HFA) 108 (90 BASE) MCG/ACT inhaler Inhale 2 puffs into the lungs every 6 (six) hours as needed for wheezing or shortness of breath. 12/05/14   Mosie Lukes, MD  atorvastatin (LIPITOR) 20 MG tablet TAKE 1 TABLET (20 MG TOTAL) BY MOUTH DAILY. 01/07/17   Mosie Lukes, MD  busPIRone (BUSPAR) 10 MG tablet TAKE 1 TABLET BY MOUTH TWICE A DAY 02/25/16   Mosie Lukes, MD  carvedilol (COREG) 12.5 MG tablet TAKE 1 TABLET BY MOUTH TWICE DAILY WITH A MEAL 12/08/16   Deboraha Sprang, MD  clonazePAM (KLONOPIN) 0.5 MG tablet Take 1 tablet (0.5 mg total) by mouth 3 (three) times daily as needed. 07/10/16   Mosie Lukes, MD  furosemide (LASIX) 40 MG tablet TAKE 1 TABLET BY MOUTH DAILY. IF WEIGHT INCREASED > 3LBS OVERNIGHT INCREASE BY ONE TABLET 12/08/16   Deboraha Sprang, MD  glipiZIDE (GLUCOTROL) 5 MG tablet TAKE 1/2 TABLET (2.5 MG TOTAL) BY MOUTH ONCE A DAY BEFORE BREAKFAST 09/16/16   Mosie Lukes, MD  irbesartan (AVAPRO) 150 MG tablet TAKE 1 TABLET (150 MG TOTAL) BY MOUTH DAILY. 09/01/16   Deboraha Sprang, MD  metFORMIN (GLUCOPHAGE) 500 MG tablet TAKE 1 TABLET BY MOUTH TWICE DAILY WITH A MEAL 01/09/17   Mosie Lukes, MD  OVER THE COUNTER MEDICATION Place 1 drop into both eyes as needed (for dry eyes). "OTC Good Sense Eye Drops"    [provider]  rivaroxaban (XARELTO) 20 MG TABS tablet TAKE 1 TABLET BY MOUTH EVERY DAY WITH SUPPER 09/26/16   Deboraha Sprang, MD    Family History Family History  Problem Relation Age of Onset  . Alzheimer's disease Mother   . Heart failure Father 100  . Hypertension Father   . Hyperlipidemia Father   . Cancer Father        lung- took half of a young- smoker  . Heart disease Father        MI at 78  . Leukemia Brother   . Drug abuse Daughter        heroine  . Early death Neg Hx   .  Kidney disease Neg Hx   . Stroke Neg Hx     Social History Social History   Tobacco Use  . Smoking status: Former Smoker    Packs/day: 1.00    Years: 40.00    Pack years: 40.00    Types: Cigarettes    Last attempt to quit: 02/01/2011    Years since quitting: 5.9  . Smokeless tobacco: Never Used  Substance Use Topics  . Alcohol use: Yes    Comment: drinks 2 bottles of wine per week  . Drug use: No     Allergies   Patient has no known allergies.   Review of Systems Review of Systems  All other systems reviewed and are negative.    Physical Exam Updated Vital Signs BP (!) 146/84 (BP Location: Right Arm)   Pulse 85   Temp 98.6 F (37 C) (Oral)   Resp (!) 25   Ht 6' 2"  (1.88 m)   Wt 89.8 kg (198 lb)   SpO2 99%   BMI 25.42 kg/m   Physical Exam  Constitutional: He appears well-developed and well-nourished. No distress.  HENT:  Head: Atraumatic.  Eyes: Conjunctivae are normal.  Neck: Neck supple. No JVD present.  Cardiovascular:  Irregularly irregular heart rhythm without murmur rubs gallops  Pulmonary/Chest: Effort normal. He has rales (Very faint crackles heard at lung bases).  Abdominal: Soft. He exhibits no distension.  There is no tenderness.  Musculoskeletal:       Right lower leg: He exhibits edema (1+ pitting edema).       Left lower leg: He exhibits edema (1+ pitting edema).  Neurological: He is alert.  Skin: Skin is warm. Capillary refill takes less than 2 seconds. No rash noted.  Psychiatric: He has a normal mood and affect.  Nursing note and vitals reviewed.    ED Treatments / Results  Labs (all labs ordered are listed, but only abnormal results are displayed) Labs Reviewed  BASIC METABOLIC PANEL - Abnormal; Notable for the following components:      Result Value   Sodium 133 (*)    All other components within normal limits  I-STAT TROPONIN, ED - Abnormal; Notable for the following components:   Troponin i, poc 0.23 (*)    All other components within normal limits  CBC WITH DIFFERENTIAL/PLATELET  BRAIN NATRIURETIC PEPTIDE  D-DIMER, QUANTITATIVE (NOT AT [facility])    EKG  EKG Interpretation  Date/Time:  Thursday January 29 2017 09:00:35 EST Ventricular Rate:  83 PR Interval:    QRS Duration: 134 QT Interval:  413 QTC Calculation: 486 R Axis:   13 Text Interpretation:  Atrial fibrillation IVCD, consider atypical LBBB Since prior ECG, patient is now in atrial fibrillation, no other significant findings Confirmed by Gareth Morgan [id number]), editor Hattie Perch ([id number]) on 01/29/2017 9:18:08 AM       Radiology Dg Chest 2 View  Result Date: 01/28/2017 CLINICAL DATA:  Occasional shortness of breath, smoking history EXAM: CHEST  2 VIEW COMPARISON:  Chest x-ray of 01/16/2015 FINDINGS: There is little change in mild cardiomegaly. However there are now small bilateral pleural effusions present with pulmonary vascular congestion, indicative of mild CHF. Median sternotomy sutures are noted from prior CABG. A single AICD lead overlies the anterior chest. There are degenerative changes in the thoracic spine IMPRESSION: Probable mild CHF with cardiomegaly, small pleural effusions, and mild  pulmonary vascular congestion. Electronically Signed   By: Ivar Drape M.D.   On: 01/28/2017 16:38    Procedures Procedures (  including critical care time)  Medications Ordered in ED Medications - No data to display   Initial Impression / Assessment and Plan / ED Course  I have reviewed the triage vital signs and the nursing notes.  Pertinent labs & imaging results that were available during my care of the patient were reviewed by me and considered in my medical decision making (see chart for details).     BP (!) 141/92   Pulse 75   Temp 98.6 F (37 C) (Oral)   Resp 14   Ht 6' 2"  (1.88 m)   Wt 89.8 kg (198 lb)   SpO2 97%   BMI 25.42 kg/m    Final Clinical Impressions(s) / ED Diagnoses   Final diagnoses:  Acute on chronic congestive heart failure, unspecified heart failure type (HCC)  Elevated troponin I level  NSTEMI (non-ST elevated myocardial infarction) [facility])    ED Discharge Orders    None     9:19 AM Patient here with symptoms suggestive of CHF exacerbation.  Known history of CHF.  He is in no acute respiratory discomfort.  History of paroxysmal atrial fibrillation appears to be rate controlled.  No infectious symptoms.  No active chest pain.  Workup initiated. Care discussed with Dr. Billy Fischer.     This patients CHA2DS2-VASc Score and unadjusted Ischemic Stroke Rate (% per year) is equal to 7.2 % stroke rate/year from a score of 5  Above score calculated as 1 point each if present [CHF, HTN, DM, Vascular=MI/PAD/Aortic Plaque, Age if 65-74, or Male] Above score calculated as 2 points each if present [Age > 75, or Stroke/TIA/TE]  9:58 AM CHF with EF 25%.  BNP from yesterday was 1,187.  Has elevated trop of 0.23. Suspect demand ischemia 2/2 CHF exacerbation. EKG with afib but no ischemic changes. CXR showing pulmonary vascular congestion and small pleural effusions which remains unchanged, no edema.    10:02 AM Appreciate consultation to Tennis Must, who  will notify cardiologist to see pt in the ER.    10:19 AM Appreciate consultation from [facility] who agrees to see and admit pt for further management.  D-dimer still pending.    CRITICAL CARE Performed by: Domenic Moras Total critical care time: 35 minutes Critical care time was exclusive of separately billable procedures and treating other patients. Critical care was necessary to treat or prevent imminent or life-threatening deterioration. Critical care was time spent personally by me on the following activities: development of treatment plan with patient and/or surrogate as well as nursing, discussions with consultants, evaluation of patient's response to treatment, examination of patient, obtaining history from patient or surrogate, ordering and performing treatments and interventions, ordering and review of laboratory studies, ordering and review of radiographic studies, pulse oximetry and re-evaluation of patient's condition.    Domenic Moras, PA-C 01/29/17 1027    Gareth Morgan, MD 01/29/17 [phone]

## 2017-01-30 ENCOUNTER — Inpatient Hospital Stay (HOSPITAL_COMMUNITY): Payer: Medicare HMO

## 2017-01-30 DIAGNOSIS — N179 Acute kidney failure, unspecified: Secondary | ICD-10-CM

## 2017-01-30 DIAGNOSIS — E119 Type 2 diabetes mellitus without complications: Secondary | ICD-10-CM

## 2017-01-30 DIAGNOSIS — F17210 Nicotine dependence, cigarettes, uncomplicated: Secondary | ICD-10-CM

## 2017-01-30 DIAGNOSIS — I361 Nonrheumatic tricuspid (valve) insufficiency: Secondary | ICD-10-CM

## 2017-01-30 LAB — RAPID URINE DRUG SCREEN, HOSP PERFORMED
Amphetamines: NOT DETECTED
BARBITURATES: NOT DETECTED
BENZODIAZEPINES: NOT DETECTED
Cocaine: NOT DETECTED
Opiates: NOT DETECTED
Tetrahydrocannabinol: NOT DETECTED

## 2017-01-30 LAB — BASIC METABOLIC PANEL
Anion gap: 12 (ref 5–15)
BUN: 19 mg/dL (ref 6–20)
CALCIUM: 9.2 mg/dL (ref 8.9–10.3)
CO2: 23 mmol/L (ref 22–32)
Chloride: 100 mmol/L — ABNORMAL LOW (ref 101–111)
Creatinine, Ser: 1.25 mg/dL — ABNORMAL HIGH (ref 0.61–1.24)
GFR calc Af Amer: >60 mL/min (ref >60)
GFR, EST NON AFRICAN AMERICAN: 57 mL/min — AB (ref >60)
GLUCOSE: 106 mg/dL — AB (ref 65–99)
Potassium: 4.4 mmol/L (ref 3.5–5.1)
Sodium: 135 mmol/L (ref 135–145)

## 2017-01-30 LAB — GLUCOSE, CAPILLARY
GLUCOSE-CAPILLARY: 108 mg/dL — AB (ref 65–99)
GLUCOSE-CAPILLARY: 126 mg/dL — AB (ref 65–99)
Glucose-Capillary: 115 mg/dL — ABNORMAL HIGH (ref 65–99)
Glucose-Capillary: 97 mg/dL (ref 65–99)

## 2017-01-30 LAB — ECHOCARDIOGRAM COMPLETE
HEIGHTINCHES: 74 in
WEIGHTICAEL: 3033.53 [oz_av]

## 2017-01-30 LAB — LIPID PANEL
CHOL/HDL RATIO: 2.8 ratio
Cholesterol: 89 mg/dL (ref 0–200)
HDL: 32 mg/dL — ABNORMAL LOW (ref >40)
LDL Cholesterol: 40 mg/dL (ref 0–99)
Triglycerides: 86 mg/dL (ref <150)
VLDL: 17 mg/dL (ref 0–40)

## 2017-01-30 NOTE — Progress Notes (Signed)
Progress Note  Patient Name: Larry Rangel Date of Encounter: 01/30/2017  Primary Cardiologist: Sherryl Manges, MD   Subjective   Pt is breathing better, but still with lower extremity edema and JVD. He is tolerating rate controlled Afib well, asymptomatic. Pt wishes to discharge home, however he has not walked in the halls yet. Given his clinical picture, pt would benefit from IV lasix today with consideration of PO lasix tomorrow.  Inpatient Medications    Scheduled Meds: . aspirin EC  81 mg Oral Daily  . atorvastatin  20 mg Oral q1800  . busPIRone  10 mg Oral BID  . carvedilol  12.5 mg Oral BID WC  . folic acid  1 mg Oral Daily  . furosemide  40 mg Intravenous BID  . insulin aspart  0-9 Units Subcutaneous TID WC  . irbesartan  150 mg Oral Daily  . multivitamin with minerals  1 tablet Oral Daily  . rivaroxaban  20 mg Oral Q supper  . thiamine  100 mg Oral Daily   Or  . thiamine  100 mg Intravenous Daily   Continuous Infusions:  PRN Meds: acetaminophen **OR** acetaminophen, albuterol, bisacodyl, clonazePAM, HYDROcodone-acetaminophen, LORazepam **OR** LORazepam, ondansetron **OR** ondansetron (ZOFRAN) IV, senna-docusate   Vital Signs    Vitals:   01/29/17 1901 01/29/17 2322 01/30/17 0554 01/30/17 0814  BP: (!) 151/103 118/74 (!) 124/91 122/65  Pulse: 94  79 77  Resp: 20   18  Temp: 98.1 F (36.7 C) 97.9 F (36.6 C) (!) 97.3 F (36.3 C) 97.6 F (36.4 C)  TempSrc: Oral Oral Oral Oral  SpO2: 100% 95% 98% 99%  Weight: 195 lb 14.4 oz (88.9 kg)  189 lb 9.5 oz (86 kg)   Height: 6\' 2"  (1.88 m)       Intake/Output Summary (Last 24 hours) at 01/30/2017 1030 Last data filed at 01/30/2017 0600 Gross per 24 hour  Intake -  Output 1100 ml  Net -1100 ml   Filed Weights   01/29/17 0900 01/29/17 1901 01/30/17 0554  Weight: 198 lb (89.8 kg) 195 lb 14.4 oz (88.9 kg) 189 lb 9.5 oz (86 kg)    Telemetry    Afib, rate controlled - Personally Reviewed  ECG    Afib,  ventricular rate 66 - Personally Reviewed  Physical Exam   GEN: No acute distress.   Neck: No JVD Cardiac: Irregular rhythm, regular rate, no murmurs, rubs, or gallops.  Respiratory: Clear to auscultation bilaterally. GI: Soft, nontender, non-distended  MS: 1+  edema; No deformity. Neuro:  Nonfocal  Psych: Normal affect   Labs    Chemistry Recent Labs  Lab 01/28/17 1134 01/29/17 0907 01/30/17 0554  NA 134* 133* 135  K 4.7 4.1 4.4  CL 99 101 100*  CO2 29 22 23   GLUCOSE 85 92 106*  BUN 12 12 19   CREATININE 0.91 0.86 1.25*  CALCIUM 9.0 9.1 9.2  PROT 6.5  --   --   ALBUMIN 3.8  --   --   AST 15  --   --   ALT 12  --   --   ALKPHOS 62  --   --   BILITOT 2.4*  --   --   GFRNONAA  --  >60 57*  GFRAA  --  >60 >60  ANIONGAP  --  10 12     Hematology Recent Labs  Lab 01/28/17 1134 01/29/17 0907  WBC 7.0 8.3  RBC 4.88 4.66  HGB 15.2 14.5  HCT 46.8 43.9  MCV 96.0 94.2  MCH  --  31.1  MCHC 32.4 33.0  RDW 16.3* 14.5  PLT 166.0 172    Cardiac Enzymes Recent Labs  Lab 01/29/17 1116 01/29/17 1406 01/29/17 1733  TROPONINI 0.46* 0.42* 0.47*    Recent Labs  Lab 01/29/17 0919  TROPIPOC 0.23*     BNP Recent Labs  Lab 01/28/17 1134 01/29/17 0907  BNP  --  1,151.4*  PROBNP 1,187.0*  --      DDimer  Recent Labs  Lab 01/29/17 0907  DDIMER 1.03*     Radiology    Dg Chest 2 View  Result Date: 01/29/2017 CLINICAL DATA:  Short of breath EXAM: CHEST  2 VIEW COMPARISON:  01/28/2017 FINDINGS: CABG. Electrical stimulator overlying the sternum in the subcutaneous tissues unchanged Cardiac enlargement with mild vascular congestion. Negative for pulmonary edema. Small bilateral effusions unchanged. Bibasilar atelectasis unchanged IMPRESSION: Pulmonary vascular congestion and small pleural effusions unchanged. Negative for edema. Electronically Signed   By: Marlan Palau M.D.   On: 01/29/2017 09:44   Dg Chest 2 View  Result Date: 01/28/2017 CLINICAL DATA:   Occasional shortness of breath, smoking history EXAM: CHEST  2 VIEW COMPARISON:  Chest x-ray of 01/16/2015 FINDINGS: There is little change in mild cardiomegaly. However there are now small bilateral pleural effusions present with pulmonary vascular congestion, indicative of mild CHF. Median sternotomy sutures are noted from prior CABG. A single AICD lead overlies the anterior chest. There are degenerative changes in the thoracic spine IMPRESSION: Probable mild CHF with cardiomegaly, small pleural effusions, and mild pulmonary vascular congestion. Electronically Signed   By: Dwyane Dee M.D.   On: 01/28/2017 16:38    Cardiac Studies   Echo pending  Patient Profile     67 y.o. male  with a hx of CAD with prior CABG 2009, ICM (EF 15% by echo 2015, 25% in 09/2016) with S-ICD 2015, PAF, prior aflutter ablation 2014, HTN, HLD, syncope in 02/2014, LBBB who is being seen for atrial fibrillation and CHF.  Assessment & Plan    1. Afib RVR - pt continues in rate controlled fib/flutter and is asymptomatic - it is unclear if he has taken his medications correctly at home - xarelto resumed yesterday - This patients CHA2DS2-VASc Score and unadjusted Ischemic Stroke Rate (% per year) is equal to 4.8 % stroke rate/year from a score of 4 (PAD, age, CHF, HTN) - continue home coreg   2. Elevated troponin - troponin 0.46 --> 0.42 --> 0.47 - trend is flat, inconsistent with ACS, low suspicion for ongoing ischemic process, likely related to Afib RVR and congestive heart failure exacerbation   3. Acute on chronic systolic and diastolic heart failure - pt is diuresing on 40 mg IV lasix BID - he is overall net negative 1L with 1.1 L urine output yesterday (I&Os do not appear to be charted) - weight is 189 lbs, down from 198 lbs at admission - dry weight appears to be 192 lbs (from last clinic visit 07/30/16) - pt received morning IV dose of lasix, will consider transitioning to PO dosing tomorrow - still with  lower extremity edema   4. DM - per primary team   5. HTN - continue home irbesartan - pressures have been controlled today    For questions or updates, please contact [facility] Please consult [email or web address] for contact info under Cardiology/STEMI.      Signed, Larry Rutherford Duke, PA  01/30/2017, 10:30 AM  Personally seen and examined. Agree with above.  Breathing better. No CP Labs reviewed, trop flat Good output AFIB  - irreg irreg tachy. Alert  - Xarelto, coreg (restarted possibly 01/30/16 as he told me he was not taking any meds)  - If rate does not improve with Bb and ongoing improvement of HF, consider cardioversion in 3 weeks if Xarelto in uninterrupted.   Acute on chronic systolic HF  - IV lasix 40 BID  - EF 15% previously  - 1.1 out, weight down 9 pounds??  - Continue  ICD  - sub Q, skin rash  DM/HTN - stable.   Larry Schultz, MD

## 2017-01-30 NOTE — Progress Notes (Cosign Needed)
PROGRESS NOTE    Larry Rangel  LSL:[id number] DOB: 04-08-1950 DOA: 01/29/2017 PCP: Bradd Canary, MD   Brief Narrative:  67 y/o male presents to ED with shortness of breath, has medical history significant for paroxysmal atrial flutter, coronary artery disease, tobacco dependence, chronic combined heart failure hypertension, hyperlipidemia, diabetes mellitus.  Shortness of breath progressively worsen for the past 3-4 days prior to ED admission, occurs by exertion when walking up the stairs and laying down, has to position himself sitting upright leaning forward to help with his breathing.  He has been sleeping on recliner for a long time to sleep better.  Patient denies any lightheadedness, chest pain, palpitations.  Initial findings BP 146/84, pulse 86, temp 98.6, respiratory 25, SpO2 99% on room air, troponin 0.47, BNP 1151, creatines 0.91, glucose 85.  CXR mild CHF with cardiomegaly, small pleural effusions.  EKG irregularly irregular, atrial fibrillation, multiform ectopic ventricular beats.  Patient was admitted to the hospital with working diagnosis of acute respiratory distress likely due to chronic combined CHF exacerbation.   Assessment & Plan:   Active Problems:   Hyperlipidemia   Essential hypertension   Diabetes mellitus type 2 in obese (HCC)   OSA (obstructive sleep apnea)   Anxiety state   CHF (congestive heart failure) (HCC)   Paroxysmal atrial fibrillation (HCC)   LBBB (left bundle branch block)   Ischemic cardiomyopathy   Acute on chronic combined systolic (congestive) and diastolic (congestive) heart failure (HCC)  1. Acute respiratory distress on chronic CHF exacerbation - Patient denies difficulty breathing, SOB when walking to bathroom.  Lung sounds are clear to auscultation bilaterally. SpO2 99% on room air.  Output 1100 yesterday, weight improving 89.8kg, 88.9kg, 86.0kg. Creatine elevated from 0.91 to 1.25, as well as BUN 12, 19. Will continue Lasix.  2.  Chest pain - Patient denies any chest pain or difficulty breathing. Troponin 0.47, BNP 1151.4. D-dimer 1.03. Per cardiology, elevated troponin thought to be ischemia related to heart failure and a-fib, patient condition stable with improved fluid status therefore not likely.  Will continue carvedilol, atorvastatin, aspirin.  3. Essential hypertension - BP stable. Will continue irbesartan.  4. Diabetes mellitus type 2  -  A1C 4.8. Insulin has not been given. Continue to monitor blood glucose.  5. Hyperlipidemia - HDL 32, LDL 40, triglycerides 86.  Will continue atorvastatin.   DVT prophylaxis: rivaroxaban Code Status: Full Family Communication: Wife at bedside  Disposition Plan: Possible d/c today or tomorrow to home   Consultants:     Procedures: Antimicrobials:  Subjective: Patient sitting up and appears alert, oriented, and calm.  Patient denies any dizziness, fever, chest pain, palpitation, shortness of breath, pain in abdomen, changes in bowel habits, pain in extremities.  Patient reports ready to go home and has been walking to the bathroom and back without any discomfort.    Objective: Vitals:   01/29/17 1901 01/29/17 2322 01/30/17 0554 01/30/17 0814  BP: (!) 151/103 118/74 (!) 124/91 122/65  Pulse: 94  79 77  Resp: 20   18  Temp: 98.1 F (36.7 C) 97.9 F (36.6 C) (!) 97.3 F (36.3 C) 97.6 F (36.4 C)  TempSrc: Oral Oral Oral Oral  SpO2: 100% 95% 98% 99%  Weight: 88.9 kg (195 lb 14.4 oz)  86 kg (189 lb 9.5 oz)   Height: 6\' 2"  (1.88 m)       Intake/Output Summary (Last 24 hours) at 01/30/2017 0835 Last data filed at 01/30/2017 0600 Gross per 24 hour  Intake -  Output 1100 ml  Net -1100 ml   Filed Weights   01/29/17 0900 01/29/17 1901 01/30/17 0554  Weight: 89.8 kg (198 lb) 88.9 kg (195 lb 14.4 oz) 86 kg (189 lb 9.5 oz)    Examination:  General exam: Appears calm and comfortable  Respiratory system: Clear to auscultation. Respiratory effort normal. No  wheezing or crackle Cardiovascular system: S1 & S2 heard, RRR.  +1 edema lower extremities bilaterally. Gastrointestinal system: Abdomen is nondistended, soft and nontender.  Central nervous system: Alert and oriented. No focal neurological deficits. Skin: No rashes, lesions or ulcers   Data Reviewed: I have personally reviewed following labs and imaging studies  CBC: Recent Labs  Lab 01/28/17 1134 01/29/17 0907  WBC 7.0 8.3  NEUTROABS 5.4 5.9  HGB 15.2 14.5  HCT 46.8 43.9  MCV 96.0 94.2  PLT 166.0 172   Basic Metabolic Panel: Recent Labs  Lab 01/28/17 1134 01/29/17 0907 01/30/17 0554  NA 134* 133* 135  K 4.7 4.1 4.4  CL 99 101 100*  CO2 29 22 23   GLUCOSE 85 92 106*  BUN 12 12 19   CREATININE 0.91 0.86 1.25*  CALCIUM 9.0 9.1 9.2   GFR: Estimated Creatinine Clearance: 65.8 mL/min (A) (by C-G formula based on SCr of 1.25 mg/dL (H)). Liver Function Tests: Recent Labs  Lab 01/28/17 1134  AST 15  ALT 12  ALKPHOS 62  BILITOT 2.4*  PROT 6.5  ALBUMIN 3.8   No results for input(s): LIPASE, AMYLASE in the last 168 hours. No results for input(s): AMMONIA in the last 168 hours. Coagulation Profile: No results for input(s): INR, PROTIME in the last 168 hours. Cardiac Enzymes: Recent Labs  Lab 01/29/17 1116 01/29/17 1406 01/29/17 1733  TROPONINI 0.46* 0.42* 0.47*   BNP (last 3 results) Recent Labs    01/28/17 1134  PROBNP 1,187.0*   HbA1C: Recent Labs    01/29/17 1300  HGBA1C 4.8   CBG: Recent Labs  Lab 01/29/17 1200 01/29/17 2133 01/30/17 0753  GLUCAP 90 132* 108*   Lipid Profile: Recent Labs    01/30/17 0554  CHOL 89  HDL 32*  LDLCALC 40  TRIG 86  CHOLHDL 2.8   Thyroid Function Tests: Recent Labs    01/29/17 0913  TSH 0.855   Anemia Panel: No results for input(s): VITAMINB12, FOLATE, FERRITIN, TIBC, IRON, RETICCTPCT in the last 72 hours. Sepsis Labs: No results for input(s): PROCALCITON, LATICACIDVEN in the last 168 hours.  No  results found for this or any previous visit (from the past 240 hour(s)).       Radiology Studies: Dg Chest 2 View  Result Date: 01/29/2017 CLINICAL DATA:  Short of breath EXAM: CHEST  2 VIEW COMPARISON:  01/28/2017 FINDINGS: CABG. Electrical stimulator overlying the sternum in the subcutaneous tissues unchanged Cardiac enlargement with mild vascular congestion. Negative for pulmonary edema. Small bilateral effusions unchanged. Bibasilar atelectasis unchanged IMPRESSION: Pulmonary vascular congestion and small pleural effusions unchanged. Negative for edema. Electronically Signed   By: Marlan Palau M.D.   On: 01/29/2017 09:44   Dg Chest 2 View  Result Date: 01/28/2017 CLINICAL DATA:  Occasional shortness of breath, smoking history EXAM: CHEST  2 VIEW COMPARISON:  Chest x-ray of 01/16/2015 FINDINGS: There is little change in mild cardiomegaly. However there are now small bilateral pleural effusions present with pulmonary vascular congestion, indicative of mild CHF. Median sternotomy sutures are noted from prior CABG. A single AICD lead overlies the anterior chest. There are degenerative changes  in the thoracic spine IMPRESSION: Probable mild CHF with cardiomegaly, small pleural effusions, and mild pulmonary vascular congestion. Electronically Signed   By: Dwyane Dee M.D.   On: 01/28/2017 16:38        Scheduled Meds: . aspirin EC  81 mg Oral Daily  . atorvastatin  20 mg Oral q1800  . busPIRone  10 mg Oral BID  . carvedilol  12.5 mg Oral BID WC  . folic acid  1 mg Oral Daily  . furosemide  40 mg Intravenous BID  . insulin aspart  0-9 Units Subcutaneous TID WC  . irbesartan  150 mg Oral Daily  . multivitamin with minerals  1 tablet Oral Daily  . rivaroxaban  20 mg Oral Q supper  . thiamine  100 mg Oral Daily   Or  . thiamine  100 mg Intravenous Daily   Continuous Infusions:   LOS: 1 day    Jo-ku Loraine Leriche) Hillery Aldo, PA-S

## 2017-01-30 NOTE — Plan of Care (Signed)
Educated both the patient and his wife of the treatment plan. Both verbalize having an understanding. Will continue to educate the patient about ongoing as well as new treatment plans.

## 2017-01-30 NOTE — Progress Notes (Signed)
PROGRESS NOTE    Larry Rangel  EEF:[id number] DOB: November 07, 1950 DOA: 01/29/2017 PCP: Bradd Canary, MD    Brief Narrative:  This is a 67 year old male who presented with dyspnea. Patient does have a significant past medical history for diabetes mellitus type 2, hypertension, anxiety, paroxysmal atrial fibrillation, heart failure. For last 5 days patient has been experiencing dyspnea, is a functional physical capacity, orthopnea and PND. No cough or fevers. On initial physical examination blood pressure 140/78, heart rate 86, temperature 98.6, respiratory 20, oxygen saturation 97%. Moist mucous membranes, lungs positive scattered rails bilaterally, no wheezing, heart S1-S2 present irregularly irregular, no gallops, rubs or murmurs, abdomen soft and nontender, positive lower extremity edema.   Patient was admitted to the hospital working diagnosis of acute on chronic systolic heart failure decompensation  Assessment & Plan:   Active Problems:   Hyperlipidemia   Essential hypertension   Diabetes mellitus type 2 in obese (HCC)   OSA (obstructive sleep apnea)   Anxiety state   CHF (congestive heart failure) (HCC)   Paroxysmal atrial fibrillation (HCC)   LBBB (left bundle branch block)   Ischemic cardiomyopathy   Acute on chronic combined systolic (congestive) and diastolic (congestive) heart failure (HCC)   1. Acute on chronic systolic heart failure decompesation. Patient responding well to diuresis, will continue furosemide to target negative fluid balance. At home non compliant with Na restricted diet. Continue asa. Carvedilol and irbesartan. Mild troponin elevation due to volume overload, no signs of acute coronary syndrome.   2. Atrial fibrillation. Rate controlled with coreg, anticoagulation with rivaroxaban.  3. Dyslipidemia. Continue statin therapy with atorvastatin.   4. Tobacco and etho abuse. Smoking cessation. Patient has been placed on benzodiazepines per protocol CIWA.     5. T2dm. Insulin sliding scale for glucose cover and monitoring. Capillary glucose 90, 108, 126, 115. Patient tolerating po well.   6. AKI. Serum cr up to 1,25, will follow on renal panel in am, avoid hypotension, continue diuresis with furosemide. K at 4,4 and serum bicarbonate at 23.    DVT prophylaxis: rivaroxaban  Code Status:  full Family Communication: I spoke with patient's family at the bedside and all questions were addressed Disposition Plan: home   Consultants:     Procedures:     Antimicrobials:      Subjective: Patient feeling better, dyspnea has improved, no nausea or vomiting, no chest pain, Improved edema lower extremities.   Objective: Vitals:   01/30/17 0554 01/30/17 0814 01/30/17 1235 01/30/17 1600  BP: (!) 124/91 122/65 117/76 111/74  Pulse: 79 77 69 69  Resp:  18 18   Temp: (!) 97.3 F (36.3 C) 97.6 F (36.4 C) 97.6 F (36.4 C) 97.6 F (36.4 C)  TempSrc: Oral Oral Oral Oral  SpO2: 98% 99% 98% 99%  Weight: 86 kg (189 lb 9.5 oz)     Height:        Intake/Output Summary (Last 24 hours) at 01/30/2017 1754 Last data filed at 01/30/2017 1724 Gross per 24 hour  Intake 600 ml  Output 1725 ml  Net -1125 ml   Filed Weights   01/29/17 0900 01/29/17 1901 01/30/17 0554  Weight: 89.8 kg (198 lb) 88.9 kg (195 lb 14.4 oz) 86 kg (189 lb 9.5 oz)    Examination:   General: Not in pain or dyspnea, deconditioned Neurology: Awake and alert, non focal  E ENT:mid  pallor, no icterus, oral mucosa moist Cardiovascular: No JVD. S1-S2 present,irregularly irregular, no gallops, rubs, or murmurs. ++  lower extremity edema. Pulmonary: vesicular breath sounds bilaterally, adequate air movement, no wheezing, rhonchi or rales. Gastrointestinal. Abdomen flat, no organomegaly, non tender, no rebound or guarding Skin. No rashes Musculoskeletal: no joint deformities     Data Reviewed: I have personally reviewed following labs and imaging studies  CBC: Recent  Labs  Lab 01/28/17 1134 01/29/17 0907  WBC 7.0 8.3  NEUTROABS 5.4 5.9  HGB 15.2 14.5  HCT 46.8 43.9  MCV 96.0 94.2  PLT 166.0 172   Basic Metabolic Panel: Recent Labs  Lab 01/28/17 1134 01/29/17 0907 01/30/17 0554  NA 134* 133* 135  K 4.7 4.1 4.4  CL 99 101 100*  CO2 29 22 23   GLUCOSE 85 92 106*  BUN 12 12 19   CREATININE 0.91 0.86 1.25*  CALCIUM 9.0 9.1 9.2   GFR: Estimated Creatinine Clearance: 65.8 mL/min (A) (by C-G formula based on SCr of 1.25 mg/dL (H)). Liver Function Tests: Recent Labs  Lab 01/28/17 1134  AST 15  ALT 12  ALKPHOS 62  BILITOT 2.4*  PROT 6.5  ALBUMIN 3.8   No results for input(s): LIPASE, AMYLASE in the last 168 hours. No results for input(s): AMMONIA in the last 168 hours. Coagulation Profile: No results for input(s): INR, PROTIME in the last 168 hours. Cardiac Enzymes: Recent Labs  Lab 01/29/17 1116 01/29/17 1406 01/29/17 1733  TROPONINI 0.46* 0.42* 0.47*   BNP (last 3 results) Recent Labs    01/28/17 1134  PROBNP 1,187.0*   HbA1C: Recent Labs    01/29/17 1300  HGBA1C 4.8   CBG: Recent Labs  Lab 01/29/17 1200 01/29/17 2133 01/30/17 0753 01/30/17 1216 01/30/17 1715  GLUCAP 90 132* 108* 126* 115*   Lipid Profile: Recent Labs    01/30/17 0554  CHOL 89  HDL 32*  LDLCALC 40  TRIG 86  CHOLHDL 2.8   Thyroid Function Tests: Recent Labs    01/29/17 0913  TSH 0.855   Anemia Panel: No results for input(s): VITAMINB12, FOLATE, FERRITIN, TIBC, IRON, RETICCTPCT in the last 72 hours.    Radiology Studies: I have reviewed all of the imaging during this hospital visit personally     Scheduled Meds: . aspirin EC  81 mg Oral Daily  . atorvastatin  20 mg Oral q1800  . busPIRone  10 mg Oral BID  . carvedilol  12.5 mg Oral BID WC  . folic acid  1 mg Oral Daily  . furosemide  40 mg Intravenous BID  . insulin aspart  0-9 Units Subcutaneous TID WC  . irbesartan  150 mg Oral Daily  . multivitamin with minerals   1 tablet Oral Daily  . rivaroxaban  20 mg Oral Q supper  . thiamine  100 mg Oral Daily   Or  . thiamine  100 mg Intravenous Daily   Continuous Infusions:   LOS: 1 day        Jaris Kohles Annett Gula, MD [facility] Pager [phone]

## 2017-01-30 NOTE — Progress Notes (Signed)
2D Echocardiogram has been performed.  Pieter Partridge 01/30/2017, 11:41 AM

## 2017-01-30 NOTE — Progress Notes (Signed)
Pt is currently restless/axious to have a cigarette offered to get order for nicotine patch he just wants to have something to relax.

## 2017-01-31 DIAGNOSIS — I10 Essential (primary) hypertension: Secondary | ICD-10-CM

## 2017-01-31 DIAGNOSIS — F411 Generalized anxiety disorder: Secondary | ICD-10-CM

## 2017-01-31 LAB — BASIC METABOLIC PANEL
Anion gap: 12 (ref 5–15)
BUN: 24 mg/dL — AB (ref 6–20)
CO2: 22 mmol/L (ref 22–32)
CREATININE: 1.45 mg/dL — AB (ref 0.61–1.24)
Calcium: 8.9 mg/dL (ref 8.9–10.3)
Chloride: 102 mmol/L (ref 101–111)
GFR calc Af Amer: 56 mL/min — ABNORMAL LOW (ref >60)
GFR, EST NON AFRICAN AMERICAN: 48 mL/min — AB (ref >60)
Glucose, Bld: 137 mg/dL — ABNORMAL HIGH (ref 65–99)
Potassium: 3.6 mmol/L (ref 3.5–5.1)
Sodium: 136 mmol/L (ref 135–145)

## 2017-01-31 LAB — GLUCOSE, CAPILLARY
GLUCOSE-CAPILLARY: 93 mg/dL (ref 65–99)
Glucose-Capillary: 158 mg/dL — ABNORMAL HIGH (ref 65–99)

## 2017-01-31 MED ORDER — FUROSEMIDE 40 MG PO TABS
60.0000 mg | ORAL_TABLET | Freq: Every day | ORAL | Status: DC
  Administered 2017-02-01 – 2017-02-02 (×2): 60 mg via ORAL
  Filled 2017-01-31 (×2): qty 1

## 2017-01-31 NOTE — Plan of Care (Signed)
Kept patient informed on plan of care. He desires to go home but verbalize understanding the need for hospitalization.

## 2017-01-31 NOTE — Progress Notes (Signed)
PROGRESS NOTE    Larry Rangel  ZOX:[id number] DOB: 1950-05-08 DOA: 01/29/2017 PCP: Bradd Canary, MD    Brief Narrative:  This is a 67 year old male who presented with dyspnea. Patient does have a significant past medical history for diabetes mellitus type 2, hypertension, anxiety, paroxysmal atrial fibrillation, heart failure. For last 5 days patient has been experiencing dyspnea, is a functional physical capacity, orthopnea and PND. No cough or fevers. On initial physical examination blood pressure 140/78, heart rate 86, temperature 98.6, respiratory 20, oxygen saturation 97%. Moist mucous membranes, lungs positive scattered rails bilaterally, no wheezing, heart S1-S2 present irregularly irregular, no gallops, rubs or murmurs, abdomen soft and nontender, positive lower extremity edema.   Patient was admitted to the hospital working diagnosis of acute on chronic systolic heart failure decompensation   Assessment & Plan:   Active Problems:   Hyperlipidemia   Essential hypertension   Diabetes mellitus type 2 in obese (HCC)   OSA (obstructive sleep apnea)   Anxiety state   CHF (congestive heart failure) (HCC)   Paroxysmal atrial fibrillation (HCC)   LBBB (left bundle branch block)   Ischemic cardiomyopathy   Acute on chronic combined systolic (congestive) and diastolic (congestive) heart failure (HCC)  1. Acute on chronic systolic heart failure decompesation. Urine output 975 over last 24 hours with negative 1,475 fluid balance since admission, will continue heart failure management with b blockade, with carvedilol and arb with irbesartan. Holding diuresis for now due to worsening renal function. Echocardiography with LV systolic function 20 to 25%. With diffuse hypokinesis, with akinesis of the inferior myocardium.   2. Atrial fibrillation. Continue rate control with coreg, continue anticoagulation with rivaroxaban.  3. Dyslipidemia. On statin therapy with atorvastatin.    4. Tobacco and etho abuse. Currently on benzodiazepines per protocol, no signs of withdrawal.   5. T2dm. Continue insulin sliding scale for glucose cover and monitoring. Capillary glucose 108, 126, 115, 97, 93.  6. AKI. Worsening serum cr up to 1,45. Suspected over-diuresis, will hold on furosemide and will follow on renal panel in am, avoid hypotension and nephrotoxic medications.   DVT prophylaxis: rivaroxaban  Code Status:  full Family Communication: I spoke with patient's family at the bedside and all questions were addressed Disposition Plan: home   Consultants:     Procedures:     Antimicrobials:      Subjective: Persistent dyspnea on exertion and lower extremity edema, no nausea or vomiting, no chest pain.   Objective: Vitals:   01/30/17 2143 01/31/17 0045 01/31/17 0533 01/31/17 0839  BP:  (!) 147/96 113/78 116/90  Pulse:  77 73 83  Resp:  18    Temp:  97.7 F (36.5 C) 97.6 F (36.4 C)   TempSrc:   Oral   SpO2: 100% 99% 99%   Weight:   85.1 kg (187 lb 9.8 oz)   Height:        Intake/Output Summary (Last 24 hours) at 01/31/2017 0931 Last data filed at 01/31/2017 0534 Gross per 24 hour  Intake 360 ml  Output 975 ml  Net -615 ml   Filed Weights   01/29/17 1901 01/30/17 0554 01/31/17 0533  Weight: 88.9 kg (195 lb 14.4 oz) 86 kg (189 lb 9.5 oz) 85.1 kg (187 lb 9.8 oz)    Examination:   General: Not in pain or dyspnea, deconditioned Neurology: Awake and alert, non focal  E ENT: mild pallor, no icterus, oral mucosa moist Cardiovascular: No JVD. S1-S2 present, rhythmic, no gallops, rubs,  or murmurs. +/++ pitting lower extremity edema. Pulmonary: decreased breath sounds bilaterally at bases, adequate air movement, no wheezing, rhonchi or rales. Gastrointestinal. Abdomen flat, no organomegaly, non tender, no rebound or guarding Skin. No rashes Musculoskeletal: no joint deformities   Data Reviewed: I have personally reviewed following labs  and imaging studies  CBC: Recent Labs  Lab 01/28/17 1134 01/29/17 0907  WBC 7.0 8.3  NEUTROABS 5.4 5.9  HGB 15.2 14.5  HCT 46.8 43.9  MCV 96.0 94.2  PLT 166.0 172   Basic Metabolic Panel: Recent Labs  Lab 01/28/17 1134 01/29/17 0907 01/30/17 0554  NA 134* 133* 135  K 4.7 4.1 4.4  CL 99 101 100*  CO2 29 22 23   GLUCOSE 85 92 106*  BUN 12 12 19   CREATININE 0.91 0.86 1.25*  CALCIUM 9.0 9.1 9.2   GFR: Estimated Creatinine Clearance: 65.8 mL/min (A) (by C-G formula based on SCr of 1.25 mg/dL (H)). Liver Function Tests: Recent Labs  Lab 01/28/17 1134  AST 15  ALT 12  ALKPHOS 62  BILITOT 2.4*  PROT 6.5  ALBUMIN 3.8   No results for input(s): LIPASE, AMYLASE in the last 168 hours. No results for input(s): AMMONIA in the last 168 hours. Coagulation Profile: No results for input(s): INR, PROTIME in the last 168 hours. Cardiac Enzymes: Recent Labs  Lab 01/29/17 1116 01/29/17 1406 01/29/17 1733  TROPONINI 0.46* 0.42* 0.47*   BNP (last 3 results) Recent Labs    01/28/17 1134  PROBNP 1,187.0*   HbA1C: Recent Labs    01/29/17 1300  HGBA1C 4.8   CBG: Recent Labs  Lab 01/29/17 2133 01/30/17 0753 01/30/17 1216 01/30/17 1715 01/30/17 2200  GLUCAP 132* 108* 126* 115* 97   Lipid Profile: Recent Labs    01/30/17 0554  CHOL 89  HDL 32*  LDLCALC 40  TRIG 86  CHOLHDL 2.8   Thyroid Function Tests: Recent Labs    01/29/17 0913  TSH 0.855   Anemia Panel: No results for input(s): VITAMINB12, FOLATE, FERRITIN, TIBC, IRON, RETICCTPCT in the last 72 hours.    Radiology Studies: I have reviewed all of the imaging during this hospital visit personally     Scheduled Meds: . aspirin EC  81 mg Oral Daily  . atorvastatin  20 mg Oral q1800  . busPIRone  10 mg Oral BID  . carvedilol  12.5 mg Oral BID WC  . folic acid  1 mg Oral Daily  . furosemide  40 mg Intravenous BID  . insulin aspart  0-9 Units Subcutaneous TID WC  . irbesartan  150 mg Oral  Daily  . multivitamin with minerals  1 tablet Oral Daily  . rivaroxaban  20 mg Oral Q supper  . thiamine  100 mg Oral Daily   Or  . thiamine  100 mg Intravenous Daily   Continuous Infusions:   LOS: 2 days        Mauricio Annett Gula, MD [facility] Pager [phone]

## 2017-01-31 NOTE — Progress Notes (Signed)
Progress Note  Patient Name: Larry Rangel Date of Encounter: 01/31/2017  Primary Cardiologist: Sherryl Manges, MD   Subjective   Diuresed another 375 cc negative recorded overnight. Weight down 2 lbs, however. Breathing is better. Echo yesterday showed LVEF 20-25% with severe global hypokinesis, mild to moderate MR and left pleural effusion. Creatinine elevated today to 1.45 from 1.25. Potassium is trending down from 4.4 to 3.6.  Inpatient Medications    Scheduled Meds: . aspirin EC  81 mg Oral Daily  . atorvastatin  20 mg Oral q1800  . busPIRone  10 mg Oral BID  . carvedilol  12.5 mg Oral BID WC  . folic acid  1 mg Oral Daily  . furosemide  40 mg Intravenous BID  . insulin aspart  0-9 Units Subcutaneous TID WC  . irbesartan  150 mg Oral Daily  . multivitamin with minerals  1 tablet Oral Daily  . rivaroxaban  20 mg Oral Q supper  . thiamine  100 mg Oral Daily   Or  . thiamine  100 mg Intravenous Daily   Continuous Infusions:  PRN Meds: acetaminophen **OR** acetaminophen, albuterol, bisacodyl, clonazePAM, HYDROcodone-acetaminophen, LORazepam **OR** LORazepam, ondansetron **OR** ondansetron (ZOFRAN) IV, senna-docusate   Vital Signs    Vitals:   01/31/17 0045 01/31/17 0533 01/31/17 0839 01/31/17 1236  BP: (!) 147/96 113/78 116/90 (!) 132/96  Pulse: 77 73 83 71  Resp: 18   20  Temp: 97.7 F (36.5 C) 97.6 F (36.4 C)  (!) 97.3 F (36.3 C)  TempSrc:  Oral  Oral  SpO2: 99% 99%  99%  Weight:  187 lb 9.8 oz (85.1 kg)    Height:        Intake/Output Summary (Last 24 hours) at 01/31/2017 1319 Last data filed at 01/31/2017 1005 Gross per 24 hour  Intake 600 ml  Output 1375 ml  Net -775 ml   Filed Weights   01/29/17 1901 01/30/17 0554 01/31/17 0533  Weight: 195 lb 14.4 oz (88.9 kg) 189 lb 9.5 oz (86 kg) 187 lb 9.8 oz (85.1 kg)    Telemetry    Afib, rate controlled - Personally Reviewed  ECG   N/A  Physical Exam   GEN: No acute distress.   Neck: no  JVD Cardiac: Irregular rhythm, regular rate, no murmurs, rubs, or gallops.  Respiratory: Clear to auscultation bilaterally. GI: Soft, nontender, non-distended  MS: trace edema; No deformity. Neuro:  Nonfocal  Psych: Normal affect   Labs    Chemistry Recent Labs  Lab 01/28/17 1134 01/29/17 0907 01/30/17 0554 01/31/17 0938  NA 134* 133* 135 136  K 4.7 4.1 4.4 3.6  CL 99 101 100* 102  CO2 29 22 23 22   GLUCOSE 85 92 106* 137*  BUN 12 12 19  24*  CREATININE 0.91 0.86 1.25* 1.45*  CALCIUM 9.0 9.1 9.2 8.9  PROT 6.5  --   --   --   ALBUMIN 3.8  --   --   --   AST 15  --   --   --   ALT 12  --   --   --   ALKPHOS 62  --   --   --   BILITOT 2.4*  --   --   --   GFRNONAA  --  >60 57* 48*  GFRAA  --  >60 >60 56*  ANIONGAP  --  10 12 12      Hematology Recent Labs  Lab 01/28/17 1134 01/29/17 0907  WBC  7.0 8.3  RBC 4.88 4.66  HGB 15.2 14.5  HCT 46.8 43.9  MCV 96.0 94.2  MCH  --  31.1  MCHC 32.4 33.0  RDW 16.3* 14.5  PLT 166.0 172    Cardiac Enzymes Recent Labs  Lab 01/29/17 1116 01/29/17 1406 01/29/17 1733  TROPONINI 0.46* 0.42* 0.47*    Recent Labs  Lab 01/29/17 0919  TROPIPOC 0.23*     BNP Recent Labs  Lab 01/28/17 1134 01/29/17 0907  BNP  --  1,151.4*  PROBNP 1,187.0*  --      DDimer  Recent Labs  Lab 01/29/17 0907  DDIMER 1.03*     Radiology    No results found.  Cardiac Studies   Echo pending  Patient Profile     67 y.o. male  with a hx of CAD with prior CABG 2009, ICM (EF 15% by echo 2015, 25% in 09/2016) with S-ICD 2015, PAF, prior aflutter ablation 2014, HTN, HLD, syncope in 02/2014, LBBB who is being seen for atrial fibrillation and CHF.  Assessment & Plan    1. Afib RVR - pt continues in rate controlled fib/flutter and is asymptomatic - it is unclear if he has taken his medications correctly at home - xarelto resumed yesterday - This patients CHA2DS2-VASc Score and unadjusted Ischemic Stroke Rate (% per year) is equal to 4.8  % stroke rate/year from a score of 4 (PAD, age, CHF, HTN) - continue home coreg  2. Elevated troponin - troponin 0.46 --> 0.42 --> 0.47 - trend is flat, inconsistent with ACS, low suspicion for ongoing ischemic process, likely related to Afib RVR and congestive heart failure exacerbation  3. Acute on chronic systolic and diastolic heart failure - pt is diuresing on 40 mg IV lasix BID - he is overall net negative 1L with 1.1 L urine output yesterday (I&Os do not appear to be charted) - weight is 189 lbs, down from 198 lbs at admission - dry weight appears to be 192 lbs (from last clinic visit 07/30/16) - Hold additional IV lasix, switch to lasix 60 mg po daily starting tomorrow.  4. DM - per primary team  5. HTN - continue home irbesartan - pressures have been controlled today  For questions or updates, please contact [facility] Please consult [email or web address] for contact info under Cardiology/STEMI.   Chrystie Nose, MD, [facility], FACP  [place]  [facility]  Medical Director of the [facility]  [facility] Diplomate of the American Board of Clinical Lipidology Attending Cardiologist  Direct Dial: [phone]  Fax: [phone]  Website:  [email or web address]  Chrystie Nose, MD  01/31/2017, 1:19 PM

## 2017-02-01 ENCOUNTER — Inpatient Hospital Stay (HOSPITAL_COMMUNITY): Payer: Medicare HMO

## 2017-02-01 LAB — GLUCOSE, CAPILLARY
GLUCOSE-CAPILLARY: 100 mg/dL — AB (ref 65–99)
GLUCOSE-CAPILLARY: 95 mg/dL (ref 65–99)
GLUCOSE-CAPILLARY: 161 mg/dL — AB (ref 65–99)
GLUCOSE-CAPILLARY: 92 mg/dL (ref 65–99)
Glucose-Capillary: 83 mg/dL (ref 65–99)
Glucose-Capillary: 156 mg/dL — ABNORMAL HIGH (ref 65–99)

## 2017-02-01 LAB — BASIC METABOLIC PANEL
Anion gap: 10 (ref 5–15)
BUN: 22 mg/dL — AB (ref 6–20)
CO2: 26 mmol/L (ref 22–32)
CREATININE: 1.34 mg/dL — AB (ref 0.61–1.24)
Calcium: 8.9 mg/dL (ref 8.9–10.3)
Chloride: 103 mmol/L (ref 101–111)
GFR calc Af Amer: >60 mL/min (ref >60)
GFR, EST NON AFRICAN AMERICAN: 53 mL/min — AB (ref >60)
GLUCOSE: 152 mg/dL — AB (ref 65–99)
POTASSIUM: 3.6 mmol/L (ref 3.5–5.1)
SODIUM: 139 mmol/L (ref 135–145)

## 2017-02-01 LAB — MAGNESIUM: MAGNESIUM: 1.9 mg/dL (ref 1.7–2.4)

## 2017-02-01 MED ORDER — TECHNETIUM TC 99M DIETHYLENETRIAME-PENTAACETIC ACID
30.0000 | Freq: Once | INTRAVENOUS | Status: AC | PRN
  Administered 2017-02-01: 30 via RESPIRATORY_TRACT

## 2017-02-01 MED ORDER — SPIRONOLACTONE 12.5 MG HALF TABLET
12.5000 mg | ORAL_TABLET | Freq: Every day | ORAL | Status: DC
  Administered 2017-02-01 – 2017-02-02 (×2): 12.5 mg via ORAL
  Filled 2017-02-01 (×2): qty 1

## 2017-02-01 MED ORDER — TECHNETIUM TO 99M ALBUMIN AGGREGATED
4.0000 | Freq: Once | INTRAVENOUS | Status: AC | PRN
  Administered 2017-02-01: 4 via INTRAVENOUS

## 2017-02-01 NOTE — Progress Notes (Signed)
Pt had quiet night.  Stated he feels better and is looking forward to going home.  No signs distress noted  Requested klonopin [id number]

## 2017-02-01 NOTE — Progress Notes (Signed)
Progress Note  Patient Name: Larry Rangel Date of Encounter: 02/01/2017  Primary Cardiologist: Sherryl Manges, MD   Subjective   Wants to go home but not ready This am first PO lasix and still with PND/orhtopnea  Inpatient Medications    Scheduled Meds: . aspirin EC  81 mg Oral Daily  . atorvastatin  20 mg Oral q1800  . busPIRone  10 mg Oral BID  . carvedilol  12.5 mg Oral BID WC  . folic acid  1 mg Oral Daily  . furosemide  60 mg Oral Daily  . insulin aspart  0-9 Units Subcutaneous TID WC  . irbesartan  150 mg Oral Daily  . multivitamin with minerals  1 tablet Oral Daily  . rivaroxaban  20 mg Oral Q supper  . thiamine  100 mg Oral Daily   Or  . thiamine  100 mg Intravenous Daily   Continuous Infusions:  PRN Meds: acetaminophen **OR** acetaminophen, albuterol, bisacodyl, clonazePAM, HYDROcodone-acetaminophen, LORazepam **OR** LORazepam, ondansetron **OR** ondansetron (ZOFRAN) IV, senna-docusate   Vital Signs    Vitals:   01/31/17 1236 01/31/17 2145 02/01/17 0629 02/01/17 0856  BP: (!) 132/96 (!) 146/75 (!) 142/79 128/80  Pulse: 71 68 77 64  Resp: 20  15   Temp: (!) 97.3 F (36.3 C) (!) 97.2 F (36.2 C) 99.2 F (37.3 C)   TempSrc: Oral Oral Oral   SpO2: 99% 97% 98%   Weight:   186 lb 4.8 oz (84.5 kg)   Height:        Intake/Output Summary (Last 24 hours) at 02/01/2017 1000 Last data filed at 02/01/2017 0953 Gross per 24 hour  Intake 1320 ml  Output 1225 ml  Net 95 ml   Filed Weights   01/30/17 0554 01/31/17 0533 02/01/17 0629  Weight: 189 lb 9.5 oz (86 kg) 187 lb 9.8 oz (85.1 kg) 186 lb 4.8 oz (84.5 kg)    Telemetry    Afib, rate controlled - Personally Reviewed  ECG   N/A  Physical Exam   GEN: No acute distress.   Neck: no JVD Cardiac: Irregular rhythm, regular rate, no murmurs, rubs, or gallops.  Respiratory: Clear to auscultation bilaterally. GI: Soft, nontender, non-distended  MS: trace edema; No deformity. Neuro:  Nonfocal  Psych:  Normal affect   Labs    Chemistry Recent Labs  Lab 01/28/17 1134  01/30/17 0554 01/31/17 0938 02/01/17 0826  NA 134*   < > 135 136 139  K 4.7   < > 4.4 3.6 3.6  CL 99   < > 100* 102 103  CO2 29   < > 23 22 26   GLUCOSE 85   < > 106* 137* 152*  BUN 12   < > 19 24* 22*  CREATININE 0.91   < > 1.25* 1.45* 1.34*  CALCIUM 9.0   < > 9.2 8.9 8.9  PROT 6.5  --   --   --   --   ALBUMIN 3.8  --   --   --   --   AST 15  --   --   --   --   ALT 12  --   --   --   --   ALKPHOS 62  --   --   --   --   BILITOT 2.4*  --   --   --   --   GFRNONAA  --    < > 57* 48* 53*  GFRAA  --    < > >  60 56* >60  ANIONGAP  --    < > 12 12 10    < > = values in this interval not displayed.     Hematology Recent Labs  Lab 01/28/17 1134 01/29/17 0907  WBC 7.0 8.3  RBC 4.88 4.66  HGB 15.2 14.5  HCT 46.8 43.9  MCV 96.0 94.2  MCH  --  31.1  MCHC 32.4 33.0  RDW 16.3* 14.5  PLT 166.0 172    Cardiac Enzymes Recent Labs  Lab 01/29/17 1116 01/29/17 1406 01/29/17 1733  TROPONINI 0.46* 0.42* 0.47*    Recent Labs  Lab 01/29/17 0919  TROPIPOC 0.23*     BNP Recent Labs  Lab 01/28/17 1134 01/29/17 0907  BNP  --  1,151.4*  PROBNP 1,187.0*  --      DDimer  Recent Labs  Lab 01/29/17 0907  DDIMER 1.03*     Radiology    No results found.  Cardiac Studies   Echo pending  Patient Profile     67 y.o. male  with a hx of CAD with prior CABG 2009, ICM (EF 15% by echo 2015, 25% in 09/2016) with S-ICD 2015, PAF, prior aflutter ablation 2014, HTN, HLD, syncope in 02/2014, LBBB who is being seen for atrial fibrillation and CHF.  Assessment & Plan    1. Afib RVR - pt continues in rate controlled fib/flutter and is asymptomatic - it is unclear if he has taken his medications correctly at home - xarelto resumed yesterday - This patients CHA2DS2-VASc Score and unadjusted Ischemic Stroke Rate (% per year) is equal to 4.8 % stroke rate/year from a score of 4 (PAD, age, CHF, HTN) - continue home  coreg - consider elective DCC in 3 weeks   2. Elevated troponin - troponin 0.46 --> 0.42 --> 0.47 - trend is flat, inconsistent with ACS, low suspicion for ongoing ischemic process, likely related to Afib RVR and congestive heart failure exacerbation  3. Acute on chronic systolic and diastolic heart failure - continue PO lasix today add low dose aldactone   4. DM - per primary team  5. HTN - continue home irbesartan - pressures have been controlled today  Probable d/c in am outpatient f/u [facility] [employer]

## 2017-02-01 NOTE — Progress Notes (Signed)
Pt refuses CPAP at this time. RT will continue to monitor.  

## 2017-02-01 NOTE — Progress Notes (Signed)
PROGRESS NOTE    Larry Rangel  ZOX:[id number] DOB: Aug 15, 1950 DOA: 01/29/2017 PCP: Bradd Canary, MD    Brief Narrative:  This is a 67 year old male who presented with dyspnea. Patient does have a significant past medical history for diabetes mellitus type 2, hypertension, anxiety, paroxysmal atrial fibrillation, heart failure. For last 5 days patient has been experiencing dyspnea, is a functional physical capacity, orthopnea and PND. No cough or fevers. On initial physical examination blood pressure 140/78, heart rate 86, temperature 98.6, respiratory 20, oxygen saturation 97%.Moist mucous membranes, lungs positive scattered rails bilaterally, no wheezing, heart S1-S2 present irregularly irregular, no gallops, rubs or murmurs, abdomen soft and nontender, positive lower extremity edema.Sodium 133, potassium 4.1, chloride 11, bicarbonate 22, glucose 92, BUN 12, creatinine 0.86, BNP 1151, troponin 0.46, white count 8.3, hemoglobin 14.5, hematocrit 43.9, platelets 172. D-dimer 1.03, his x-ray with cardiomegaly, increased hilar vascular congestion and small bilateral pleural effusions. EKG atrial fibrillation rate 66 as per minute, left axis deviation, inverted T waves in I and  AVL, atrial fibrillation rhythm.   Patient was admitted to the hospital working diagnosis of acute on chronic systolic heart failure decompensation   Assessment & Plan:   Active Problems:   Hyperlipidemia   Essential hypertension   Diabetes mellitus type 2 in obese (HCC)   OSA (obstructive sleep apnea)   Anxiety state   CHF (congestive heart failure) (HCC)   Paroxysmal atrial fibrillation (HCC)   LBBB (left bundle branch block)   Ischemic cardiomyopathy   Acute on chronic combined systolic (congestive) and diastolic (congestive) heart failure (HCC)   1. Acute on chronic systolic heart failure decompesation  LV systolic function 20 to 25%. With diffuse hypokinesis, with akinesis of the inferior myocardium.    Since admission with negative 1,620 ml fluid balance. Heart failure management with carvedilol and  irbesartan. Resume diuresis with oral furosemide 60 mg po daily, ejection fraction less than 35%, started on spironolactone. Clinically more euvolemic.   2. Atrial fibrillation. Rate control with coreg, and anticoagulation with rivaroxaban.  3. Dyslipidemia. On atorvastatin with good toleration.   4. Tobacco and etho abuse. On benzodiazepines per protocol, he has used 1 mg on 01/18, no signs of withdrawal. Will discontinue protocol and will add only as needed po lorazepam for anxiety.   5. T2dm. Continue insulin sliding scale for glucose cover and monitoring. Capillary glucose 93, 156, 158, 100, 95. Patient tolerating po well.   6. AKI. Renal function with serum cr at 1,34 from 1,45, diuresis has been resumed with oral furosemide, K at 3,6. Started on aldactone. Follow on renal panel in am.  DVT prophylaxis:rivaroxaban Code Status:full Family Communication:I spoke with patient's family at the bedside and all questions were addressed Disposition Plan:home   Consultants:    Procedures:    Antimicrobials:       Subjective: Patient feeling better, dyspnea and edema continue to improve, no nausea or vomiting, no anxiety or tremors.   Objective: Vitals:   01/31/17 1236 01/31/17 2145 02/01/17 0629 02/01/17 0856  BP: (!) 132/96 (!) 146/75 (!) 142/79 128/80  Pulse: 71 68 77 64  Resp: 20  15   Temp: (!) 97.3 F (36.3 C) (!) 97.2 F (36.2 C) 99.2 F (37.3 C)   TempSrc: Oral Oral Oral   SpO2: 99% 97% 98%   Weight:   84.5 kg (186 lb 4.8 oz)   Height:        Intake/Output Summary (Last 24 hours) at 02/01/2017 1017 Last data  filed at 02/01/2017 0953 Gross per 24 hour  Intake 1080 ml  Output 825 ml  Net 255 ml   Filed Weights   01/30/17 0554 01/31/17 0533 02/01/17 0629  Weight: 86 kg (189 lb 9.5 oz) 85.1 kg (187 lb 9.8 oz) 84.5 kg (186 lb 4.8 oz)     Examination:   General: Not in pain or dyspnea, deconditioned Neurology: Awake and alert, non focal  E ENT: mild pallor, no icterus, oral mucosa moist Cardiovascular: No JVD. S1-S2 present, rhythmic, no gallops, rubs, or murmurs. Trace lower extremity edema. Pulmonary: vesicular breath sounds bilaterally, adequate air movement, no wheezing, rhonchi or rales. Gastrointestinal. Abdomen flat, no organomegaly, non tender, no rebound or guarding Skin. No rashes Musculoskeletal: no joint deformities     Data Reviewed: I have personally reviewed following labs and imaging studies  CBC: Recent Labs  Lab 01/28/17 1134 01/29/17 0907  WBC 7.0 8.3  NEUTROABS 5.4 5.9  HGB 15.2 14.5  HCT 46.8 43.9  MCV 96.0 94.2  PLT 166.0 172   Basic Metabolic Panel: Recent Labs  Lab 01/28/17 1134 01/29/17 0907 01/30/17 0554 01/31/17 0938 02/01/17 0826  NA 134* 133* 135 136 139  K 4.7 4.1 4.4 3.6 3.6  CL 99 101 100* 102 103  CO2 29 22 23 22 26   GLUCOSE 85 92 106* 137* 152*  BUN 12 12 19  24* 22*  CREATININE 0.91 0.86 1.25* 1.45* 1.34*  CALCIUM 9.0 9.1 9.2 8.9 8.9  MG  --   --   --   --  1.9   GFR: Estimated Creatinine Clearance: 61.3 mL/min (A) (by C-G formula based on SCr of 1.34 mg/dL (H)). Liver Function Tests: Recent Labs  Lab 01/28/17 1134  AST 15  ALT 12  ALKPHOS 62  BILITOT 2.4*  PROT 6.5  ALBUMIN 3.8   No results for input(s): LIPASE, AMYLASE in the last 168 hours. No results for input(s): AMMONIA in the last 168 hours. Coagulation Profile: No results for input(s): INR, PROTIME in the last 168 hours. Cardiac Enzymes: Recent Labs  Lab 01/29/17 1116 01/29/17 1406 01/29/17 1733  TROPONINI 0.46* 0.42* 0.47*   BNP (last 3 results) Recent Labs    01/28/17 1134  PROBNP 1,187.0*   HbA1C: Recent Labs    01/29/17 1300  HGBA1C 4.8   CBG: Recent Labs  Lab 01/31/17 0752 01/31/17 1150 01/31/17 1658 01/31/17 2144 02/01/17 0744  GLUCAP 83 93 156* 158* 100*    Lipid Profile: Recent Labs    01/30/17 0554  CHOL 89  HDL 32*  LDLCALC 40  TRIG 86  CHOLHDL 2.8   Thyroid Function Tests: No results for input(s): TSH, T4TOTAL, FREET4, T3FREE, THYROIDAB in the last 72 hours. Anemia Panel: No results for input(s): VITAMINB12, FOLATE, FERRITIN, TIBC, IRON, RETICCTPCT in the last 72 hours.    Radiology Studies: I have reviewed all of the imaging during this hospital visit personally     Scheduled Meds: . aspirin EC  81 mg Oral Daily  . atorvastatin  20 mg Oral q1800  . busPIRone  10 mg Oral BID  . carvedilol  12.5 mg Oral BID WC  . folic acid  1 mg Oral Daily  . furosemide  60 mg Oral Daily  . insulin aspart  0-9 Units Subcutaneous TID WC  . irbesartan  150 mg Oral Daily  . multivitamin with minerals  1 tablet Oral Daily  . rivaroxaban  20 mg Oral Q supper  . spironolactone  12.5 mg Oral Daily  .  thiamine  100 mg Oral Daily   Or  . thiamine  100 mg Intravenous Daily   Continuous Infusions:   LOS: 3 days        Kruze Atchley Annett Gula, MD [facility] Pager [phone]

## 2017-02-02 DIAGNOSIS — E785 Hyperlipidemia, unspecified: Secondary | ICD-10-CM

## 2017-02-02 DIAGNOSIS — R7989 Other specified abnormal findings of blood chemistry: Secondary | ICD-10-CM

## 2017-02-02 DIAGNOSIS — E1169 Type 2 diabetes mellitus with other specified complication: Secondary | ICD-10-CM

## 2017-02-02 DIAGNOSIS — E669 Obesity, unspecified: Secondary | ICD-10-CM

## 2017-02-02 DIAGNOSIS — R778 Other specified abnormalities of plasma proteins: Secondary | ICD-10-CM

## 2017-02-02 DIAGNOSIS — R748 Abnormal levels of other serum enzymes: Secondary | ICD-10-CM

## 2017-02-02 DIAGNOSIS — G4733 Obstructive sleep apnea (adult) (pediatric): Secondary | ICD-10-CM

## 2017-02-02 LAB — BASIC METABOLIC PANEL
Anion gap: 9 (ref 5–15)
BUN: 25 mg/dL — AB (ref 6–20)
CALCIUM: 8.9 mg/dL (ref 8.9–10.3)
CO2: 25 mmol/L (ref 22–32)
CREATININE: 1.31 mg/dL — AB (ref 0.61–1.24)
Chloride: 108 mmol/L (ref 101–111)
GFR, EST NON AFRICAN AMERICAN: 54 mL/min — AB (ref >60)
Glucose, Bld: 115 mg/dL — ABNORMAL HIGH (ref 65–99)
Potassium: 3.5 mmol/L (ref 3.5–5.1)
SODIUM: 142 mmol/L (ref 135–145)

## 2017-02-02 LAB — GLUCOSE, CAPILLARY: Glucose-Capillary: 159 mg/dL — ABNORMAL HIGH (ref 65–99)

## 2017-02-02 MED ORDER — SPIRONOLACTONE 25 MG PO TABS: 12.5000 mg | ORAL_TABLET | Freq: Every day | ORAL | 0 refills | Status: DC

## 2017-02-02 MED ORDER — ASPIRIN 81 MG PO TBEC: 81.0000 mg | DELAYED_RELEASE_TABLET | Freq: Every day | ORAL | 0 refills | Status: AC

## 2017-02-02 MED ORDER — FUROSEMIDE 80 MG PO TABS: 80.0000 mg | ORAL_TABLET | Freq: Every day | ORAL | Status: DC

## 2017-02-02 MED ORDER — FUROSEMIDE 20 MG PO TABS: 60.0000 mg | ORAL_TABLET | Freq: Every day | ORAL | 0 refills | Status: DC

## 2017-02-02 NOTE — Progress Notes (Signed)
Progress Note  Patient Name: Larry Rangel Date of Encounter: 02/02/2017  Primary Cardiologist: Sherryl Manges, MD   Subjective   Feeling well.  Wants to go home.  Had some orthopnea overnight.  Wife notes that he was confused overnight.  He has also been struggling with memory loss at home.  Less UOP since switching to oral lasix.   Inpatient Medications    Scheduled Meds: . aspirin EC  81 mg Oral Daily  . atorvastatin  20 mg Oral q1800  . busPIRone  10 mg Oral BID  . carvedilol  12.5 mg Oral BID WC  . folic acid  1 mg Oral Daily  . furosemide  60 mg Oral Daily  . insulin aspart  0-9 Units Subcutaneous TID WC  . irbesartan  150 mg Oral Daily  . multivitamin with minerals  1 tablet Oral Daily  . rivaroxaban  20 mg Oral Q supper  . spironolactone  12.5 mg Oral Daily  . thiamine  100 mg Oral Daily   Or  . thiamine  100 mg Intravenous Daily   Continuous Infusions:  PRN Meds: acetaminophen **OR** acetaminophen, albuterol, bisacodyl, clonazePAM, HYDROcodone-acetaminophen, ondansetron **OR** ondansetron (ZOFRAN) IV, senna-docusate   Vital Signs    Vitals:   02/01/17 0856 02/01/17 1625 02/01/17 2026 02/02/17 0458  BP: 128/80 (!) 141/91 121/79 129/79  Pulse: 64 66 66 78  Resp:  18 18 20   Temp:  97.8 F (36.6 C) 97.7 F (36.5 C) 98 F (36.7 C)  TempSrc:  Oral Oral Oral  SpO2:  96% 100% 98%  Weight:    185 lb 1.6 oz (84 kg)  Height:        Intake/Output Summary (Last 24 hours) at 02/02/2017 0933 Last data filed at 02/02/2017 0933 Gross per 24 hour  Intake 1260 ml  Output 320 ml  Net 940 ml   Filed Weights   01/31/17 0533 02/01/17 0629 02/02/17 0458  Weight: 187 lb 9.8 oz (85.1 kg) 186 lb 4.8 oz (84.5 kg) 185 lb 1.6 oz (84 kg)    Telemetry    Atrial fibrillation.  PVCs.  Rate 60s-80s.  - Personally Reviewed  ECG    n/a - Personally Reviewed  Physical Exam   VS:  BP 129/79 (BP Location: Left Arm)   Pulse 78   Temp 98 F (36.7 C) (Oral)   Resp 20    Ht 6\' 2"  (1.88 m)   Wt 185 lb 1.6 oz (84 kg)   SpO2 98%   BMI 23.77 kg/m  , BMI Body mass index is 23.77 kg/m. GENERAL:  Well appearing HEENT: Pupils equal round and reactive, fundi not visualized, oral mucosa unremarkable NECK:  No jugular venous distention, waveform within normal limits, carotid upstroke brisk and symmetric, no bruits, no thyromegaly LUNGS:  Clear to auscultation bilaterally HEART:  Irregularly irregular.  PMI not displaced or sustained,S1 and S2 within normal limits, no S3, no S4, no clicks, no rubs, no murmurs ABD:  Flat, positive bowel sounds normal in frequency in pitch, no bruits, no rebound, no guarding, no midline pulsatile mass, no hepatomegaly, no splenomegaly EXT:  2 plus pulses throughout, no edema, no cyanosis no clubbing SKIN:  No rashes no nodules NEURO:  Cranial nerves II through XII grossly intact, motor grossly intact throughout [facility]:  Cognitively intact, oriented to person place and time   Labs    Chemistry Recent Labs  Lab 01/28/17 1134  01/31/17 0938 02/01/17 0826 02/02/17 0356  NA 134*   < >  136 139 142  K 4.7   < > 3.6 3.6 3.5  CL 99   < > 102 103 108  CO2 29   < > 22 26 25   GLUCOSE 85   < > 137* 152* 115*  BUN 12   < > 24* 22* 25*  CREATININE 0.91   < > 1.45* 1.34* 1.31*  CALCIUM 9.0   < > 8.9 8.9 8.9  PROT 6.5  --   --   --   --   ALBUMIN 3.8  --   --   --   --   AST 15  --   --   --   --   ALT 12  --   --   --   --   ALKPHOS 62  --   --   --   --   BILITOT 2.4*  --   --   --   --   GFRNONAA  --    < > 48* 53* 54*  GFRAA  --    < > 56* >60 >60  ANIONGAP  --    < > 12 10 9    < > = values in this interval not displayed.     Hematology Recent Labs  Lab 01/28/17 1134 01/29/17 0907  WBC 7.0 8.3  RBC 4.88 4.66  HGB 15.2 14.5  HCT 46.8 43.9  MCV 96.0 94.2  MCH  --  31.1  MCHC 32.4 33.0  RDW 16.3* 14.5  PLT 166.0 172    Cardiac Enzymes Recent Labs  Lab 01/29/17 1116 01/29/17 1406 01/29/17 1733  TROPONINI 0.46*  0.42* 0.47*    Recent Labs  Lab 01/29/17 0919  TROPIPOC 0.23*     BNP Recent Labs  Lab 01/28/17 1134 01/29/17 0907  BNP  --  1,151.4*  PROBNP 1,187.0*  --      DDimer  Recent Labs  Lab 01/29/17 0907  DDIMER 1.03*     Radiology    Dg Chest 2 View  Result Date: 02/01/2017 CLINICAL DATA:  Impaired ventilation EXAM: CHEST  2 VIEW COMPARISON:  01/29/2017 FINDINGS: Cardiac enlargement. Negative for heart failure or edema. Small bilateral pleural effusions unchanged. Improvement and mild bibasilar atelectasis. Negative for pneumonia. Postop CABG. IMPRESSION: Small bilateral pleural effusions. Improved aeration in the lung bases. Negative for pulmonary edema. Electronically Signed   By: Marlan Palau M.D.   On: 02/01/2017 13:11   Nm Pulmonary Perf And Vent  Result Date: 02/01/2017 CLINICAL DATA:  Shortness of breath and chest pain EXAM: NUCLEAR MEDICINE VENTILATION - PERFUSION LUNG SCAN VIEWS: Anterior, posterior, left lateral, right lateral, RPO, LPO, RAO, LAO-ventilation and perfusion RADIOPHARMACEUTICALS:  30.9 mCi Technetium-76m DTPA aerosol inhalation and 4.3 mCi Technetium-35m MAA IV COMPARISON:  Chest radiograph February 01, 2017 FINDINGS: Ventilation: There is decreased radiotracer uptake in a linear nonsegmental distribution in the mid right lung. There is no segmental ventilation defect. Perfusion: There is no appreciable segmental or significant subsegmental perfusion defect. Slight decreased uptake is seen in a linear distribution in the right mid lung which is slightly smaller than the ventilation defect and probably represents fluid tracking in the minor fissure on the right. There is no perfusion defect without corresponding ventilation defect. Cardiomegaly is noted. IMPRESSION: Probable fluid in the minor fissure on the right causing decreased uptake on the ventilation and perfusion studies with a more pronounced ventilation defect in perfusion defect. No appreciable  ventilation/perfusion mismatch. No segmental or significant subsegmental perfusion defects. This study  constitutes a low probability of pulmonary embolus. Note that there is cardiomegaly. Electronically Signed   By: Bretta Bang III M.D.   On: 02/01/2017 13:26    Cardiac Studies   Echo 01/30/17: Study Conclusions  - Left ventricle: The cavity size was severely dilated. Systolic   function was severely reduced. The estimated ejection fraction   was in the range of 20% to 25%. Severe diffuse hypokinesis with   distinct regional wall motion abnormalities. There is akinesis of   the inferior myocardium. Doppler parameters are consistent with   high ventricular filling pressure. - Mitral valve: Calcified annulus. There was mild to moderate   regurgitation. - Left atrium: The atrium was severely dilated. - Pulmonary arteries: PA peak pressure: 33 mm Hg (S). - Pericardium, extracardiac: There was a left pleural effusion.  Impressions:  - Severely dilated LV dimensions with severe LV dysfunction with EF   20-25%, mild to moderate MR, left pleural effusion. Severe LAE.   The findings indicate significant septal-lateral left ventricular   wall dyssynchrony.  Patient Profile     67 y.o. male with CAD s/p CABG, chronic systolic and diastolic heart failure (LVEF 20-25%), s/p ICD, paroxysmal atrial fibrillation/flutter, s/p atrial flutter ablation, hypertension, hyperlipidemia, syncope and LBBB here with atrial fibrillation and acute on chronic systolic and diastolic heart failure.   Assessment & Plan    # Acute on chronic systolic and diastolic heart failure:  Mr. Cranston switched to oral Lasix 1/20.  According to ins and outs the patient was net +700 mL's yesterday.  However his weight is down 1 pound.  I suspect that this was not completely recorded.  He notes that his urine output has decreased since starting oral lasix.  We will increase to 80mg  daily.  He will need follow up and a  BMP within a week.  He likes to drink water.  He will limit fluids to 1.5L and salt to 1.5g.  Weigh daily and take an extra dose of lasix for weight gain of 2lb in one day or 5lb in one week. Dyssynchrony noted on echo.  Mr. Bidlack has a subcutaneous ICD.  EP to decide if he would be a candidate for BiV ICD.  Patient is already going for discharge by the time I saw him this AM.  Recommend trying Entresto as an outpatient.  Continue spironolactone.   # Paroxysmal atrial fibrillation:  Rate is well-controlled and he is asymptomatic.  However, given his reduced systolic function, consider outpatient cardioversion in 3 weeks.  # Hypertension: BP controlled on carvedilol and irbesartan.    # CAD s/p CABG:  # Hyperlipidemia: Continue aspirin, carvedilol, and atorvastatin.   For questions or updates, please contact [facility] Please consult [email or web address] for contact info under Cardiology/STEMI.      Signed, Chilton Si, MD  02/02/2017, 9:33 AM

## 2017-02-02 NOTE — Plan of Care (Signed)
  Education: Knowledge of General Education information will improve 02/02/2017 [phone] - Adequate for Discharge by Chaya Jan, RN   Activity: Capacity to carry out activities will improve 02/02/2017 0948 - Adequate for Discharge by Chaya Jan, RN   Cardiac: Ability to achieve and maintain adequate cardiopulmonary perfusion will improve 02/02/2017 0948 - Adequate for Discharge by Chaya Jan, RN

## 2017-02-02 NOTE — Discharge Summary (Signed)
Physician Discharge Summary  Larry Rangel ZOX:[id number] DOB: Jan 22, 1950 DOA: 01/29/2017  PCP: Bradd Canary, MD  Admit date: 01/29/2017 Discharge date: 02/02/2017  Admitted From: Home Disposition:  Home  Recommendations for Outpatient Follow-up:  1. Follow up with PCP in 1- week 2. Furosemide has been increased to 60 g daily 3. Patient has been placed on Aldactone 4. Please follow BMP in one week   Home Health: No  Equipment/Devices: No   Discharge Condition: Stable CODE STATUS: full  Diet recommendation: heart healthy and diabetic prudent  Brief/Interim Summary: 67 year old male who presented with dyspnea. Patient does have a significant past medical history for diabetes mellitus type 2, hypertension, anxiety, paroxysmal atrial fibrillation, heart failure. For last 5 days patient has been experiencing dyspnea, and decreased in his functional physical capacity, orthopnea and PND. No cough or fevers. On initial physical examination blood pressure 140/78, heart rate 86, temperature 98.6, respiratory 20, oxygen saturation 97%.Moist mucous membranes, lungs positive scattered rails bilaterally, no wheezing, heart S1-S2 present irregularly irregular, no gallops, rubs or murmurs, abdomen soft and nontender, positive lower extremity edema.Sodium 133, potassium 4.1, chloride 11, bicarbonate 22, glucose 92, BUN 12, creatinine 0.86, BNP 1151, troponin 0.46, white count 8.3, hemoglobin 14.5, hematocrit 43.9, platelets 172. D-dimer 1.03, his x-ray with cardiomegaly, increased hilar vascular congestion and small bilateral pleural effusions. EKG atrial fibrillation rate 66 as per minute, left axis deviation, inverted T waves in I and  AVL, atrial fibrillation rhythm.   Patient was admitted to the hospital working diagnosis of acute on chronic systolic heart failure decompensation complicated with cardiogenic pulmonary edema.   1. Acute on chronic systolic heart failure decompensation, left  ventricle systolic function 20 to 25% with diffuse hypokinesis, akinesis of inferior myocardium. Patient was admitted to the medical unit, he was placed on a telemetry monitor, aggressively diuresed with IV furosemide, negative fluid balance achieved with significant improvement of his symptoms. Patient's furosemide dose has been increased to 60 mg daily, he was started on Aldactone and he has been advised to be compliant with his sodium restricted diet. To continue beta blockade and ARB, with carvedilol and irbesartan, along with aspirin.   2. Cardiogenic pulmonary edema. Patient was diuresed with furosemide, significant improvement of his symptoms, his oximetry at discharge was 98% on room air. He had a positive d-dimer on admission, pulmonary ventilation perfusion scan, resulted in low probability for pulmonary embolism.  3. Paroxysmal atrial fibrillation. Rate remained well-controlled with carvedilol, anticoagulation with rivaroxaban.   4. Acute kidney injury. Serum creatinine peaked at 1.45, by the time of discharge is down to 1.31, potassium 3.5. Will recommend close follow-up on kidney function and electrolytes as an outpatient in one week. Patient will be discharged on Aldactone and furosemide. Hold on potassium supplements due to the risk of hyperkalemia.  5. Type 2 diabetes mellitus. Patient was placed on insulin signed scale for glucose coverage and monitoring, his capillary glucose remained well-controlled. Patient will resume glipizide and metformin at discharge.  6. History of tobacco and alcohol abuse. Patient was placed on benzodiazepines per protocol CIWA, he did not require significant amount of lorazepam, no signs of withdrawal. Patient was advised about tobacco and alcohol cessation. Continue post buspirone and clonazepam.    Discharge Diagnoses:  Active Problems:   Hyperlipidemia   Essential hypertension   Diabetes mellitus type 2 in obese (HCC)   OSA (obstructive sleep  apnea)   Anxiety state   CHF (congestive heart failure) (HCC)   Paroxysmal atrial fibrillation (  HCC)   LBBB (left bundle branch block)   Ischemic cardiomyopathy   Acute on chronic combined systolic (congestive) and diastolic (congestive) heart failure (HCC)    Discharge Instructions   Allergies as of 02/02/2017   No Known Allergies     Medication List    TAKE these medications   albuterol 108 (90 Base) MCG/ACT inhaler Commonly known as:  PROVENTIL HFA;VENTOLIN HFA Inhale 2 puffs into the lungs every 6 (six) hours as needed for wheezing or shortness of breath.   aspirin 81 MG EC tablet Take 1 tablet (81 mg total) by mouth daily.   atorvastatin 20 MG tablet Commonly known as:  LIPITOR TAKE 1 TABLET (20 MG TOTAL) BY MOUTH DAILY.   busPIRone 10 MG tablet Commonly known as:  BUSPAR TAKE 1 TABLET BY MOUTH TWICE A DAY   carvedilol 12.5 MG tablet Commonly known as:  COREG TAKE 1 TABLET BY MOUTH TWICE DAILY WITH A MEAL   clonazePAM 0.5 MG tablet Commonly known as:  KLONOPIN Take 1 tablet (0.5 mg total) by mouth 3 (three) times daily as needed. What changed:  reasons to take this   furosemide 20 MG tablet Commonly known as:  LASIX Take 3 tablets (60 mg total) by mouth daily. What changed:    medication strength  See the new instructions.   glipiZIDE 5 MG tablet Commonly known as:  GLUCOTROL TAKE 1/2 TABLET (2.5 MG TOTAL) BY MOUTH ONCE A DAY BEFORE BREAKFAST What changed:  See the new instructions.   irbesartan 150 MG tablet Commonly known as:  AVAPRO TAKE 1 TABLET (150 MG TOTAL) BY MOUTH DAILY.   metFORMIN 500 MG tablet Commonly known as:  GLUCOPHAGE TAKE 1 TABLET BY MOUTH TWICE DAILY WITH A MEAL   rivaroxaban 20 MG Tabs tablet Commonly known as:  XARELTO TAKE 1 TABLET BY MOUTH EVERY DAY WITH SUPPER   spironolactone 25 MG tablet Commonly known as:  ALDACTONE Take 0.5 tablets (12.5 mg total) by mouth daily. Start taking on:  02/03/2017       No Known  Allergies  Consultations:  Cardiology    Procedures/Studies: Dg Chest 2 View  Result Date: 02/01/2017 CLINICAL DATA:  Impaired ventilation EXAM: CHEST  2 VIEW COMPARISON:  01/29/2017 FINDINGS: Cardiac enlargement. Negative for heart failure or edema. Small bilateral pleural effusions unchanged. Improvement and mild bibasilar atelectasis. Negative for pneumonia. Postop CABG. IMPRESSION: Small bilateral pleural effusions. Improved aeration in the lung bases. Negative for pulmonary edema. Electronically Signed   By: Marlan Palau M.D.   On: 02/01/2017 13:11   Dg Chest 2 View  Result Date: 01/29/2017 CLINICAL DATA:  Short of breath EXAM: CHEST  2 VIEW COMPARISON:  01/28/2017 FINDINGS: CABG. Electrical stimulator overlying the sternum in the subcutaneous tissues unchanged Cardiac enlargement with mild vascular congestion. Negative for pulmonary edema. Small bilateral effusions unchanged. Bibasilar atelectasis unchanged IMPRESSION: Pulmonary vascular congestion and small pleural effusions unchanged. Negative for edema. Electronically Signed   By: Marlan Palau M.D.   On: 01/29/2017 09:44   Dg Chest 2 View  Result Date: 01/28/2017 CLINICAL DATA:  Occasional shortness of breath, smoking history EXAM: CHEST  2 VIEW COMPARISON:  Chest x-ray of 01/16/2015 FINDINGS: There is little change in mild cardiomegaly. However there are now small bilateral pleural effusions present with pulmonary vascular congestion, indicative of mild CHF. Median sternotomy sutures are noted from prior CABG. A single AICD lead overlies the anterior chest. There are degenerative changes in the thoracic spine IMPRESSION: Probable mild CHF with  cardiomegaly, small pleural effusions, and mild pulmonary vascular congestion. Electronically Signed   By: Dwyane Dee M.D.   On: 01/28/2017 16:38   Nm Pulmonary Perf And Vent  Result Date: 02/01/2017 CLINICAL DATA:  Shortness of breath and chest pain EXAM: NUCLEAR MEDICINE VENTILATION -  PERFUSION LUNG SCAN VIEWS: Anterior, posterior, left lateral, right lateral, RPO, LPO, RAO, LAO-ventilation and perfusion RADIOPHARMACEUTICALS:  30.9 mCi Technetium-21m DTPA aerosol inhalation and 4.3 mCi Technetium-23m MAA IV COMPARISON:  Chest radiograph February 01, 2017 FINDINGS: Ventilation: There is decreased radiotracer uptake in a linear nonsegmental distribution in the mid right lung. There is no segmental ventilation defect. Perfusion: There is no appreciable segmental or significant subsegmental perfusion defect. Slight decreased uptake is seen in a linear distribution in the right mid lung which is slightly smaller than the ventilation defect and probably represents fluid tracking in the minor fissure on the right. There is no perfusion defect without corresponding ventilation defect. Cardiomegaly is noted. IMPRESSION: Probable fluid in the minor fissure on the right causing decreased uptake on the ventilation and perfusion studies with a more pronounced ventilation defect in perfusion defect. No appreciable ventilation/perfusion mismatch. No segmental or significant subsegmental perfusion defects. This study constitutes a low probability of pulmonary embolus. Note that there is cardiomegaly. Electronically Signed   By: Bretta Bang III M.D.   On: 02/01/2017 13:26       Subjective: Patient feeling better, no nausea or vomiting, dyspnea has improved, as well as lower extremity edema. Patient ha episode of confusion last night, selflimited  Discharge Exam: Vitals:   02/01/17 2026 02/02/17 0458  BP: 121/79 129/79  Pulse: 66 78  Resp: 18 20  Temp: 97.7 F (36.5 C) 98 F (36.7 C)  SpO2: 100% 98%   Vitals:   02/01/17 0856 02/01/17 1625 02/01/17 2026 02/02/17 0458  BP: 128/80 (!) 141/91 121/79 129/79  Pulse: 64 66 66 78  Resp:  18 18 20   Temp:  97.8 F (36.6 C) 97.7 F (36.5 C) 98 F (36.7 C)  TempSrc:  Oral Oral Oral  SpO2:  96% 100% 98%  Weight:    84 kg (185 lb 1.6 oz)   Height:        General: Not in pain or dyspnea  Neurology: Awake and alert, non focal  E ENT: no pallor, no icterus, oral mucosa moist Cardiovascular: No JVD. S1-S2 present, rhythmic, no gallops, rubs, or murmurs. No lower extremity edema. Pulmonary: vesicular breath sounds bilaterally, adequate air movement, no wheezing, rhonchi or rales. Gastrointestinal. Abdomen flat, no organomegaly, non tender, no rebound or guarding Skin. No rashes Musculoskeletal: no joint deformities   The results of significant diagnostics from this hospitalization (including imaging, microbiology, ancillary and laboratory) are listed below for reference.     Microbiology: No results found for this or any previous visit (from the past 240 hour(s)).   Labs: BNP (last 3 results) Recent Labs    01/29/17 0907  BNP 1,151.4*   Basic Metabolic Panel: Recent Labs  Lab 01/29/17 0907 01/30/17 0554 01/31/17 0938 02/01/17 0826 02/02/17 0356  NA 133* 135 136 139 142  K 4.1 4.4 3.6 3.6 3.5  CL 101 100* 102 103 108  CO2 22 23 22 26 25   GLUCOSE 92 106* 137* 152* 115*  BUN 12 19 24* 22* 25*  CREATININE 0.86 1.25* 1.45* 1.34* 1.31*  CALCIUM 9.1 9.2 8.9 8.9 8.9  MG  --   --   --  1.9  --    Liver  Function Tests: Recent Labs  Lab 01/28/17 1134  AST 15  ALT 12  ALKPHOS 62  BILITOT 2.4*  PROT 6.5  ALBUMIN 3.8   No results for input(s): LIPASE, AMYLASE in the last 168 hours. No results for input(s): AMMONIA in the last 168 hours. CBC: Recent Labs  Lab 01/28/17 1134 01/29/17 0907  WBC 7.0 8.3  NEUTROABS 5.4 5.9  HGB 15.2 14.5  HCT 46.8 43.9  MCV 96.0 94.2  PLT 166.0 172   Cardiac Enzymes: Recent Labs  Lab 01/29/17 1116 01/29/17 1406 01/29/17 1733  TROPONINI 0.46* 0.42* 0.47*   BNP: Invalid input(s): POCBNP CBG: Recent Labs  Lab 02/01/17 0744 02/01/17 1139 02/01/17 1626 02/01/17 2039 02/02/17 0752  GLUCAP 100* 95 161* 92 159*   D-Dimer No results for input(s): DDIMER in the  last 72 hours. Hgb A1c No results for input(s): HGBA1C in the last 72 hours. Lipid Profile No results for input(s): CHOL, HDL, LDLCALC, TRIG, CHOLHDL, LDLDIRECT in the last 72 hours. Thyroid function studies No results for input(s): TSH, T4TOTAL, T3FREE, THYROIDAB in the last 72 hours.  Invalid input(s): FREET3 Anemia work up No results for input(s): VITAMINB12, FOLATE, FERRITIN, TIBC, IRON, RETICCTPCT in the last 72 hours. Urinalysis    Component Value Date/Time   COLORURINE YELLOW 02/13/2014 1441   APPEARANCEUR CLEAR 02/13/2014 1441   LABSPEC 1.015 02/13/2014 1441   PHURINE 5.0 02/13/2014 1441   GLUCOSEU 100 (A) 02/13/2014 1441   GLUCOSEU NEGATIVE 11/12/2012 1648   HGBUR NEGATIVE 02/13/2014 1441   BILIRUBINUR NEGATIVE 02/13/2014 1441   KETONESUR NEGATIVE 02/13/2014 1441   PROTEINUR NEGATIVE 02/13/2014 1441   UROBILINOGEN 0.2 02/13/2014 1441   NITRITE NEGATIVE 02/13/2014 1441   LEUKOCYTESUR NEGATIVE 02/13/2014 1441   Sepsis Labs Invalid input(s): PROCALCITONIN,  WBC,  LACTICIDVEN Microbiology No results found for this or any previous visit (from the past 240 hour(s)).   Time coordinating discharge: 45 minutes  SIGNED:   Coralie Keens, MD  [facility] 02/02/2017, 10:02 AM Pager [phone]  If 7PM-7AM, please contact night-coverage [email or web address] Password [email or web address]

## 2017-02-03 ENCOUNTER — Emergency Department (HOSPITAL_COMMUNITY): Payer: Medicare HMO

## 2017-02-03 ENCOUNTER — Telehealth: Payer: Self-pay | Admitting: Internal Medicine

## 2017-02-03 ENCOUNTER — Encounter (HOSPITAL_COMMUNITY): Payer: Self-pay | Admitting: Emergency Medicine

## 2017-02-03 ENCOUNTER — Other Ambulatory Visit: Payer: Self-pay | Admitting: Family Medicine

## 2017-02-03 ENCOUNTER — Telehealth: Payer: Self-pay | Admitting: Family Medicine

## 2017-02-03 ENCOUNTER — Telehealth: Payer: Self-pay

## 2017-02-03 ENCOUNTER — Emergency Department (HOSPITAL_COMMUNITY)
Admission: EM | Admit: 2017-02-03 | Discharge: 2017-02-03 | Disposition: A | Discharge status: Home / Self Care | Payer: Medicare HMO | Attending: Emergency Medicine | Admitting: Emergency Medicine

## 2017-02-03 ENCOUNTER — Other Ambulatory Visit: Payer: Self-pay

## 2017-02-03 DIAGNOSIS — R0989 Other specified symptoms and signs involving the circulatory and respiratory systems: Secondary | ICD-10-CM | POA: Diagnosis not present

## 2017-02-03 DIAGNOSIS — Z8679 Personal history of other diseases of the circulatory system: Secondary | ICD-10-CM | POA: Insufficient documentation

## 2017-02-03 DIAGNOSIS — Z7984 Long term (current) use of oral hypoglycemic drugs: Secondary | ICD-10-CM | POA: Diagnosis not present

## 2017-02-03 DIAGNOSIS — Z7901 Long term (current) use of anticoagulants: Secondary | ICD-10-CM | POA: Insufficient documentation

## 2017-02-03 DIAGNOSIS — Z7982 Long term (current) use of aspirin: Secondary | ICD-10-CM | POA: Insufficient documentation

## 2017-02-03 DIAGNOSIS — Z79899 Other long term (current) drug therapy: Secondary | ICD-10-CM | POA: Insufficient documentation

## 2017-02-03 DIAGNOSIS — I5042 Chronic combined systolic (congestive) and diastolic (congestive) heart failure: Secondary | ICD-10-CM | POA: Diagnosis not present

## 2017-02-03 DIAGNOSIS — E119 Type 2 diabetes mellitus without complications: Secondary | ICD-10-CM | POA: Insufficient documentation

## 2017-02-03 DIAGNOSIS — I5089 Other heart failure: Secondary | ICD-10-CM | POA: Diagnosis not present

## 2017-02-03 DIAGNOSIS — R069 Unspecified abnormalities of breathing: Secondary | ICD-10-CM | POA: Diagnosis not present

## 2017-02-03 DIAGNOSIS — I2510 Atherosclerotic heart disease of native coronary artery without angina pectoris: Secondary | ICD-10-CM | POA: Insufficient documentation

## 2017-02-03 DIAGNOSIS — Z951 Presence of aortocoronary bypass graft: Secondary | ICD-10-CM | POA: Insufficient documentation

## 2017-02-03 DIAGNOSIS — I509 Heart failure, unspecified: Secondary | ICD-10-CM | POA: Diagnosis not present

## 2017-02-03 DIAGNOSIS — I110 Hypertensive heart disease with heart failure: Secondary | ICD-10-CM | POA: Insufficient documentation

## 2017-02-03 DIAGNOSIS — Z87891 Personal history of nicotine dependence: Secondary | ICD-10-CM | POA: Diagnosis not present

## 2017-02-03 DIAGNOSIS — R0602 Shortness of breath: Secondary | ICD-10-CM | POA: Insufficient documentation

## 2017-02-03 LAB — URINALYSIS, ROUTINE W REFLEX MICROSCOPIC
Bilirubin Urine: NEGATIVE
Glucose, UA: NEGATIVE mg/dL
Ketones, ur: NEGATIVE mg/dL
Leukocytes, UA: NEGATIVE
Nitrite: NEGATIVE
Protein, ur: 30 mg/dL — AB
Specific Gravity, Urine: 1.016 (ref 1.005–1.030)
Squamous Epithelial / LPF: NONE SEEN
pH: 5 (ref 5.0–8.0)

## 2017-02-03 LAB — BASIC METABOLIC PANEL
Anion gap: 15 (ref 5–15)
BUN: 15 mg/dL (ref 6–20)
CO2: 20 mmol/L — ABNORMAL LOW (ref 22–32)
Calcium: 9.3 mg/dL (ref 8.9–10.3)
Chloride: 105 mmol/L (ref 101–111)
Creatinine, Ser: 1.12 mg/dL (ref 0.61–1.24)
GFR calc Af Amer: >60 mL/min (ref >60)
GFR calc non Af Amer: >60 mL/min (ref >60)
Glucose, Bld: 98 mg/dL (ref 65–99)
Potassium: 4.4 mmol/L (ref 3.5–5.1)
Sodium: 140 mmol/L (ref 135–145)

## 2017-02-03 LAB — CBC WITH DIFFERENTIAL/PLATELET
Basophils Absolute: 0 10*3/uL (ref 0.0–0.1)
Basophils Relative: 0 %
Eosinophils Absolute: 0.1 10*3/uL (ref 0.0–0.7)
Eosinophils Relative: 1 %
HCT: 45.5 % (ref 39.0–52.0)
Hemoglobin: 14.8 g/dL (ref 13.0–17.0)
Lymphocytes Relative: 11 %
Lymphs Abs: 1.3 10*3/uL (ref 0.7–4.0)
MCH: 31.2 pg (ref 26.0–34.0)
MCHC: 32.5 g/dL (ref 30.0–36.0)
MCV: 95.8 fL (ref 78.0–100.0)
Monocytes Absolute: 0.8 10*3/uL (ref 0.1–1.0)
Monocytes Relative: 7 %
Neutro Abs: 9.4 10*3/uL — ABNORMAL HIGH (ref 1.7–7.7)
Neutrophils Relative %: 81 %
Platelets: 156 10*3/uL (ref 150–400)
RBC: 4.75 MIL/uL (ref 4.22–5.81)
RDW: 14.6 % (ref 11.5–15.5)
WBC: 11.6 10*3/uL — ABNORMAL HIGH (ref 4.0–10.5)

## 2017-02-03 LAB — TROPONIN I: Troponin I: 0.41 ng/mL (ref <0.03)

## 2017-02-03 LAB — BRAIN NATRIURETIC PEPTIDE: B Natriuretic Peptide: 1448.1 pg/mL — ABNORMAL HIGH (ref 0.0–100.0)

## 2017-02-03 MED ORDER — ALBUTEROL SULFATE HFA 108 (90 BASE) MCG/ACT IN AERS
2.0000 | INHALATION_SPRAY | Freq: Four times a day (QID) | RESPIRATORY_TRACT | Status: DC
  Administered 2017-02-03: 2 via RESPIRATORY_TRACT
  Filled 2017-02-03: qty 6.7

## 2017-02-03 MED ORDER — FUROSEMIDE 10 MG/ML IJ SOLN
60.0000 mg | Freq: Once | INTRAMUSCULAR | Status: AC
  Administered 2017-02-03: 60 mg via INTRAVENOUS
  Filled 2017-02-03: qty 6

## 2017-02-03 NOTE — ED Provider Notes (Signed)
[facility] EMERGENCY DEPARTMENT Provider Note   CSN: [id number] Arrival date & time: 02/03/17  1229     History   Chief Complaint Chief Complaint  Patient presents with  . Shortness of Breath    HPI Larry Rangel is a 67 y.o. male.  HPI Patient presents to the emergency department with shortness of breath that worsened since last night.  The patient was discharged from the hospital yesterday for an exacerbation of CHF along with a new diagnosis of atrial fibrillation.  The patient states that he has not yet taken his Lasix that was prescribed to him.  Patient states that nothing seemed to make the condition better.  He states that exertion seem to make his shortness of breath somewhat worse.  He states he was unable to lie flat last night.  Patient states that he was started on several new medications which she has not started at this time.  The patient denies chest pain, headache,blurred vision, neck pain, fever, cough, weakness, numbness, dizziness, anorexia, edema, abdominal pain, nausea, vomiting, diarrhea, rash, back pain, dysuria, hematemesis, bloody stool, near syncope, or syncope. Past Medical History:  Diagnosis Date  . Abnormal liver function 08/23/2010  . AICD (automatic cardioverter/defibrillator) present   . Anxiety   . Arthritis    "head to toe" (01/29/2017)  . CAD (coronary artery disease)    a. s/p CABG 2009. b. Cath 07/2013: 3/5 patent grafts (appear to have chronic occ grafts), mod diag/L PL branch, did not appear flow limiting, elevated filling pressures.  . CHF (congestive heart failure) ([place]) 07/30/2013  . Chicken pox as a child  . Chronic combined systolic and diastolic CHF (congestive heart failure) ([place])    a. Echo 6/14: Mild LVH, EF 30-35%, diffuse HK, MAC, mild BAE. b. Drop in EF to 20% by echo 07/2013.  Marland Kitchen Hyperlipidemia   . Hypertension   . LBBB (left bundle branch block)   . Low back pain   . Measles as a child  . Mumps as a child    . Myocardial infarction ([place]) 2009  . Paroxysmal atrial flutter ([place]) 07-2012   a. s/p ablation by Dr [name] 08-06-2012. b. Recurrence 07/2013.   Marland Kitchen Preventative health care 12/10/2014  . Tobacco user   . Type II diabetes mellitus [facility])     Patient Active Problem List   Diagnosis Date Noted  . Elevated troponin I level   . Acute on chronic combined systolic (congestive) and diastolic (congestive) heart failure ([place]) 01/29/2017  . Pulmonary emphysema ([place]) 04/17/2015  . Preventative health care 12/10/2014  . Hematoma of implantable cardioverter-defibrillator (ICD) pocket 12/16/2013  . Ischemic cardiomyopathy 11/10/2013  . LBBB (left bundle branch block) 08/15/2013  . CAD (coronary artery disease)   . Paroxysmal atrial fibrillation ([place]) 08/02/2013  . Demand ischemia ([place]) 08/02/2013  . CHF (congestive heart failure) ([place]) 07/30/2013  . Medicare welcome exam 06/12/2013  . Microalbuminuria 11/13/2012  . Anxiety state 11/13/2012  . OSA (obstructive sleep apnea) 07/22/2012  . Diabetes mellitus type 2 in obese ([place]) 03/17/2011  . Abnormal liver function 08/23/2010  . Essential hypertension 10/04/2009  . Hyperlipidemia 09/10/2009    Past Surgical History:  Procedure Laterality Date  . ATRIAL FLUTTER ABLATION N/A 08/06/2012   Procedure: ATRIAL FLUTTER ABLATION;  Surgeon: Deboraha Sprang, MD;  Location: [facility] CATH LAB;  Service: Cardiovascular;  Laterality: N/A;  . CARDIAC CATHETERIZATION  2009  . CARDIOVERSION N/A 07/15/2012   Procedure: CARDIOVERSION;  Surgeon: Wonda Cheng Nahser,  MD;  Location: [facility] ENDOSCOPY;  Service: Cardiovascular;  Laterality: N/A;  . COLONOSCOPY    . CORONARY ARTERY BYPASS GRAFT  2009   x 5  . IMPLANTABLE CARDIOVERTER DEFIBRILLATOR IMPLANT N/A 11/10/2013   Procedure: SUB Q IMPLANTABLE CARDIOVERTER DEFIBRILLATOR IMPLANT;  Surgeon: Deboraha Sprang, MD;  Location: [facility] CATH LAB;  Service: Cardiovascular;  Laterality: N/A;  . LEFT HEART CATHETERIZATION WITH CORONARY ANGIOGRAM  N/A 08/01/2013   Procedure: LEFT HEART CATHETERIZATION WITH CORONARY ANGIOGRAM;  Surgeon: Burnell Blanks, MD;  Location: [facility] CATH LAB;  Service: Cardiovascular;  Laterality: N/A;  . POCKET REVISION N/A 12/16/2013   Procedure: POCKET REVISION;  Surgeon: Evans Lance, MD;  Location: [facility] CATH LAB;  Service: Cardiovascular;  Laterality: N/A;  . SHOULDER SURGERY Left    "sewed up; sports related"  . TEE WITHOUT CARDIOVERSION N/A 07/15/2012   Procedure: TRANSESOPHAGEAL ECHOCARDIOGRAM (TEE);  Surgeon: Thayer Headings, MD;  Location: [facility];  Service: Cardiovascular;  Laterality: N/A;  . TONSILLECTOMY         Home Medications    Prior to Admission medications   Medication Sig Start Date End Date Taking? Authorizing Provider  albuterol (PROVENTIL HFA;VENTOLIN HFA) 108 (90 BASE) MCG/ACT inhaler Inhale 2 puffs into the lungs every 6 (six) hours as needed for wheezing or shortness of breath. 12/05/14  Yes Mosie Lukes, MD  aspirin 81 MG EC tablet Take 1 tablet (81 mg total) by mouth daily. 02/02/17 03/04/17 Yes Arrien, Jimmy Picket, MD  atorvastatin (LIPITOR) 20 MG tablet TAKE 1 TABLET (20 MG TOTAL) BY MOUTH DAILY. 01/07/17  Yes Mosie Lukes, MD  busPIRone (BUSPAR) 10 MG tablet TAKE 1 TABLET BY MOUTH TWICE A DAY 02/25/16  Yes Mosie Lukes, MD  carvedilol (COREG) 12.5 MG tablet TAKE 1 TABLET BY MOUTH TWICE DAILY WITH A MEAL 12/08/16  Yes Deboraha Sprang, MD  clonazePAM (KLONOPIN) 0.5 MG tablet Take 1 tablet (0.5 mg total) by mouth 3 (three) times daily as needed. Patient taking differently: Take 0.5 mg by mouth 3 (three) times daily as needed for anxiety.  07/10/16  Yes Mosie Lukes, MD  furosemide (LASIX) 20 MG tablet Take 3 tablets (60 mg total) by mouth daily. 02/02/17 03/04/17 Yes Arrien, Jimmy Picket, MD  glipiZIDE (GLUCOTROL) 5 MG tablet TAKE 1/2 TABLET (2.5 MG TOTAL) BY MOUTH ONCE A DAY BEFORE BREAKFAST Patient taking differently: 2.5MG BY MOUTH ONCE DAILY BEFORE BREAKFAST  09/16/16  Yes Mosie Lukes, MD  irbesartan (AVAPRO) 150 MG tablet TAKE 1 TABLET (150 MG TOTAL) BY MOUTH DAILY. 09/01/16  Yes Deboraha Sprang, MD  metFORMIN (GLUCOPHAGE) 500 MG tablet TAKE 1 TABLET BY MOUTH TWICE DAILY WITH A MEAL 01/09/17  Yes Mosie Lukes, MD  rivaroxaban (XARELTO) 20 MG TABS tablet TAKE 1 TABLET BY MOUTH EVERY DAY WITH SUPPER 09/26/16  Yes Deboraha Sprang, MD  spironolactone (ALDACTONE) 25 MG tablet Take 0.5 tablets (12.5 mg total) by mouth daily. 02/03/17 03/05/17 Yes Arrien, Jimmy Picket, MD    Family History Family History  Problem Relation Age of Onset  . Alzheimer's disease Mother   . Heart failure Father 9  . Hypertension Father   . Hyperlipidemia Father   . Cancer Father        lung- took half of a young- smoker  . Heart disease Father        MI at 21  . Leukemia Brother   . Drug abuse Daughter  heroine  . Early death Neg Hx   . Kidney disease Neg Hx   . Stroke Neg Hx     Social History Social History   Tobacco Use  . Smoking status: Former Smoker    Packs/day: 1.00    Years: 40.00    Pack years: 40.00    Types: Cigarettes    Last attempt to quit: 02/01/2011    Years since quitting: 6.0  . Smokeless tobacco: Never Used  Substance Use Topics  . Alcohol use: Yes    Alcohol/week: 7.2 oz    Types: 12 Glasses of wine per week  . Drug use: No     Allergies   Patient has no known allergies.   Review of Systems Review of Systems All other systems negative except as documented in the HPI. All pertinent positives and negatives as reviewed in the HPI.  Physical Exam Updated Vital Signs BP (!) 155/116 (BP Location: Right Arm) Comment: Simultaneous filing. User may not have seen previous data.  Pulse (!) 109 Comment: Simultaneous filing. User may not have seen previous data.  Temp 98.8 F (37.1 C)   Resp (!) 27 Comment: Simultaneous filing. User may not have seen previous data.  Ht _0  (1.88 m)   Wt 86.2 kg (190 lb)   SpO2 94%  Comment: Simultaneous filing. User may not have seen previous data.  BMI 24.39 kg/m   Physical Exam  Constitutional: He is oriented to person, place, and time. He appears well-developed and well-nourished. No distress.  HENT:  Head: Normocephalic and atraumatic.  Mouth/Throat: Oropharynx is clear and moist.  Eyes: Pupils are equal, round, and reactive to light.  Neck: Normal range of motion. Neck supple.  Cardiovascular: Normal rate, regular rhythm and normal heart sounds. Exam reveals no gallop and no friction rub.  No murmur heard. Pulmonary/Chest: Effort normal. No respiratory distress. He has no wheezes. He has no rhonchi. He has rales in the right lower field and the left lower field.  Abdominal: Soft. Bowel sounds are normal. He exhibits no distension. There is no tenderness.  Neurological: He is alert and oriented to person, place, and time. He exhibits normal muscle tone. Coordination normal.  Skin: Skin is warm and dry. Capillary refill takes less than 2 seconds. No rash noted. No erythema.  Psychiatric: He has a normal mood and affect. His behavior is normal.  Nursing note and vitals reviewed.    ED Treatments / Results  Labs (all labs ordered are listed, but only abnormal results are displayed) Labs Reviewed  BASIC METABOLIC PANEL - Abnormal; Notable for the following components:      Result Value   CO2 20 (*)    All other components within normal limits  CBC WITH DIFFERENTIAL/PLATELET - Abnormal; Notable for the following components:   WBC 11.6 (*)    Neutro Abs 9.4 (*)    All other components within normal limits  TROPONIN I - Abnormal; Notable for the following components:   Troponin I 0.41 (*)    All other components within normal limits  BRAIN NATRIURETIC PEPTIDE - Abnormal; Notable for the following components:   B Natriuretic Peptide 1,448.1 (*)    All other components within normal limits  URINALYSIS, ROUTINE W REFLEX MICROSCOPIC - Abnormal; Notable for the  following components:   Color, Urine AMBER (*)    Hgb urine dipstick SMALL (*)    Protein, ur 30 (*)    Bacteria, UA RARE (*)    All other components  within normal limits    EKG  EKG Interpretation None       Radiology Dg Chest 2 View  Result Date: 02/03/2017 CLINICAL DATA:  Shortness of breath with exertion since waking today. EXAM: CHEST  2 VIEW COMPARISON:  Two-view chest x-ray 02/01/2017 and 01/29/2017. FINDINGS: Heart is enlarged. External pacing device is stable. Mild bilateral pulmonary vascular congestion is again noted. Bilateral pleural effusions and basilar airspace disease are similar the prior study. IMPRESSION: 1. Cardiomegaly with stable mild pulmonary vascular congestion and bilateral pleural effusions compatible with congestive heart failure. 2. Mild bibasilar airspace disease likely reflects atelectasis. Electronically Signed   By: San Morelle M.D.   On: 02/03/2017 13:33    Procedures Procedures (including critical care time)  Medications Ordered in ED Medications  furosemide (LASIX) injection 60 mg (60 mg Intravenous Given 02/03/17 1433)     Initial Impression / Assessment and Plan / ED Course  I have reviewed the triage vital signs and the nursing notes.  Pertinent labs & imaging results that were available during my care of the patient were reviewed by me and considered in my medical decision making (see chart for details).    Patient has been stable here in the room thus far.  I have given him 60 mg of IV Lasix.  Patient ambulated without difficulty.  I will have the patient follow-up with his cardiologist told return here as needed patient agrees to the plan and all questions were answered. patient states he is feeling better following the IV Lasix.  He states that also thinks that he is little bit anxious due to the fact that this is a new diagnosis and had not taken his medications.  Final Clinical Impressions(s) / ED Diagnoses   Final  diagnoses:  None    ED Discharge Orders    None       Rebeca Allegra 02/03/17 [place], MD 02/03/17 2237

## 2017-02-03 NOTE — Telephone Encounter (Signed)
Copied from CRM [id number]. Topic: Quick Communication - Other Results >> Feb 03, 2017  3:08 PM Valentina Lucks wrote: Pt never picked up rx for Clonazepam (Klonopin) 0.5 mg tablet - 60 tablet with date of July 10, 2016. Rx had also contract for pt to sign- both documents shredded.

## 2017-02-03 NOTE — Discharge Instructions (Signed)
Return here as needed. follow-up with your primary doctor and cardiologist.

## 2017-02-03 NOTE — Telephone Encounter (Signed)
Hospital follow up call made. Left message for return call.

## 2017-02-03 NOTE — Telephone Encounter (Signed)
New Message  Pt c/o medication issue:  1. Name of Medication: Albuterol   2. How are you currently taking this medication (dosage and times per day)? NA  3. Are you having a reaction (difficulty breathing--STAT)? no  4. What is your medication issue? Per pt wife was supposed to be prescribed this medication from his hospital visit. No prescription has been sent to the pharmacy.

## 2017-02-03 NOTE — Telephone Encounter (Signed)
Called and left a detailed message for patient to call his PCP, Dr. Abner Greenspan for Albuterol orders/refills.

## 2017-02-03 NOTE — ED Notes (Signed)
Pt's oxygen saturation fluctuated between 98% and 100% on room air while ambulating; pt ambulated with a steady gait, no complaints of SOB; states "I feel fine".

## 2017-02-03 NOTE — ED Triage Notes (Signed)
Pt in from home via [facility] with c/o sob w/exertion since waking this morning. Per EMS, pt was recently hospitalized for CHF exac x 4 days, discharged yesterday. Sats 99% on RA. Pt was given rx for Lasix, has not yet taken dose since yesterday

## 2017-02-04 ENCOUNTER — Telehealth: Payer: Self-pay | Admitting: Internal Medicine

## 2017-02-04 ENCOUNTER — Telehealth: Payer: Self-pay

## 2017-02-04 NOTE — Telephone Encounter (Signed)
TCM Hospital follow up attempted. Phone lines down.

## 2017-02-04 NOTE — Telephone Encounter (Signed)
New message      Patient wife calling for patient to request a return to work note. Please call

## 2017-02-04 NOTE — Telephone Encounter (Signed)
Spoke with patient's wife and she will pick up work excuse form on 1/23.Marland Kitchen

## 2017-02-04 NOTE — Telephone Encounter (Signed)
02/04/17  Transition Care Management Follow-up Telephone Call  ADMISSION DATE: 01/29/17  DISCHARGE DATE: 02/02/17   How have you been since you were released from the hospital?  Feeling good per patient.   Do you understand why you were in the hospital? Yes   Do you understand the discharge instrcutions? Yes    Items Reviewed:  Medications reviewed: Patient states all are correct on discharge list.   Allergies reviewed: NKDA per patient   Dietary changes reviewed: Heart healthy Diabetic   Referrals reviewed: Appointment scheduled with Dr. Abner Greenspan   Functional Questionnaire:   Activities of Daily Living (ADLs): Patient can per form all independently.  Any patient concerns? None at this time per patient   Confirmed importance and date/time of follow-up visits scheduled:Yes   Confirmed with patient if condition begins to worsen call PCP or go to the ER. Yes    Patient was given the office number and encouragred to call back with questions or concerns. Yes

## 2017-02-05 NOTE — Telephone Encounter (Signed)
Last filled per database: 05/19/2016 Last written: 05/19/2016 Last ov: 01/28/2017 Next ov: 02/13/2017 Contract: yes  UDS: due since 08/24/2014

## 2017-02-10 NOTE — Telephone Encounter (Signed)
Spoke with the patient and told him we would be unable to give him a clearance to return to work without seeing his provider first. He has upcoming appointments with his PCP and Francis Dowse on Friday. He stated "ok no problem, I will get the letter when I see my primary doctor on Friday."

## 2017-02-10 NOTE — Telephone Encounter (Signed)
F/U Call:  Patient calling, states that patient needs a letter stating that he can return to work with "no restrictions." Patient is set to return to work today 02-10-17.

## 2017-02-10 NOTE — Telephone Encounter (Signed)
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

## 2017-02-12 NOTE — Progress Notes (Signed)
Cardiology Office Note Date:  02/13/2017  Patient ID:  Larry, Rangel [birth year], MRN [id number] PCP:  Mosie Lukes, MD  Cardiologist:  Dr. [name]    Chief Complaint: post-hospital  History of Present Illness: Larry Rangel is a 67 y.o. male with history of CAD (CABG 2009), ICM w/S-ICD, Aflutter ablated, PAFib, HTN, HLD, LBBB, chronic CHF (mixed), DM  He was hospitalized 01/29/17 with recurrent rate controlled AFib, acute CHF exacerbation noting noncompliance with meds, CP with mild elevation in Trop, trend was flat and felt 2/2 CHF/AF.  PO lasix dose was up-titrated, discharged 02/02/17  Had an ER visit 02/03/17 with SOB, noted he had not filled his RX or taken meds in the interrem, given dose of IV diuretic and feeling better patient wished to go home  He comes today to f/u post hospital.  He reorts feeling much better.  No ongoing SOB, no DOE and no symptoms of PND or orthopnea.  He denies any CP, palpitations, no shocks from his device.   He mentions he feels very well, points out he is able to keep up with his wife who is 53 years younger then he is.  He denies any bleeding or signs of bleeding   Device information: BSCi S-ICD, implanted 2015 ? Hx of possible VT below detection vs orthostatic in 2006 w/syncopal episode I do not see hx of any appropriate therapies  Past Medical History:  Diagnosis Date  . Abnormal liver function 08/23/2010  . AICD (automatic cardioverter/defibrillator) present   . Anxiety   . Arthritis    "head to toe" (01/29/2017)  . CAD (coronary artery disease)    a. s/p CABG 2009. b. Cath 07/2013: 3/5 patent grafts (appear to have chronic occ grafts), mod diag/L PL branch, did not appear flow limiting, elevated filling pressures.  . CHF (congestive heart failure) ([place]) 07/30/2013  . Chicken pox as a child  . Chronic combined systolic and diastolic CHF (congestive heart failure) ([place])    a. Echo 6/14: Mild LVH, EF 30-35%, diffuse HK, MAC, mild BAE.  b. Drop in EF to 20% by echo 07/2013.  Marland Kitchen Hyperlipidemia   . Hypertension   . LBBB (left bundle branch block)   . Low back pain   . Measles as a child  . Mumps as a child  . Myocardial infarction ([place]) 2009  . Paroxysmal atrial flutter ([place]) 07-2012   a. s/p ablation by Dr [name] 08-06-2012. b. Recurrence 07/2013.   Marland Kitchen Preventative health care 12/10/2014  . Tobacco user   . Type II diabetes mellitus ([place])     Past Surgical History:  Procedure Laterality Date  . ATRIAL FLUTTER ABLATION N/A 08/06/2012   Procedure: ATRIAL FLUTTER ABLATION;  Surgeon: Deboraha Sprang, MD;  Location: [facility] CATH LAB;  Service: Cardiovascular;  Laterality: N/A;  . CARDIAC CATHETERIZATION  2009  . CARDIOVERSION N/A 07/15/2012   Procedure: CARDIOVERSION;  Surgeon: Thayer Headings, MD;  Location: [facility] ENDOSCOPY;  Service: Cardiovascular;  Laterality: N/A;  . COLONOSCOPY    . CORONARY ARTERY BYPASS GRAFT  2009   x 5  . IMPLANTABLE CARDIOVERTER DEFIBRILLATOR IMPLANT N/A 11/10/2013   Procedure: SUB Q IMPLANTABLE CARDIOVERTER DEFIBRILLATOR IMPLANT;  Surgeon: Deboraha Sprang, MD;  Location: [facility] CATH LAB;  Service: Cardiovascular;  Laterality: N/A;  . LEFT HEART CATHETERIZATION WITH CORONARY ANGIOGRAM N/A 08/01/2013   Procedure: LEFT HEART CATHETERIZATION WITH CORONARY ANGIOGRAM;  Surgeon: Burnell Blanks, MD;  Location: [facility] CATH LAB;  Service: Cardiovascular;  Laterality: N/A;  . POCKET REVISION N/A 12/16/2013   Procedure: POCKET REVISION;  Surgeon: Evans Lance, MD;  Location: [facility] CATH LAB;  Service: Cardiovascular;  Laterality: N/A;  . SHOULDER SURGERY Left    "sewed up; sports related"  . TEE WITHOUT CARDIOVERSION N/A 07/15/2012   Procedure: TRANSESOPHAGEAL ECHOCARDIOGRAM (TEE);  Surgeon: Thayer Headings, MD;  Location: [place];  Service: Cardiovascular;  Laterality: N/A;  . TONSILLECTOMY      Current Outpatient Medications  Medication Sig Dispense Refill  . albuterol (PROVENTIL HFA;VENTOLIN HFA) 108 (90 BASE)  MCG/ACT inhaler Inhale 2 puffs into the lungs every 6 (six) hours as needed for wheezing or shortness of breath. 1 Inhaler 2  . aspirin 81 MG EC tablet Take 1 tablet (81 mg total) by mouth daily. 30 tablet 0  . atorvastatin (LIPITOR) 20 MG tablet TAKE 1 TABLET (20 MG TOTAL) BY MOUTH DAILY. 90 tablet 0  . busPIRone (BUSPAR) 10 MG tablet TAKE 1 TABLET BY MOUTH TWICE A DAY 180 tablet 1  . carvedilol (COREG) 12.5 MG tablet TAKE 1 TABLET BY MOUTH TWICE DAILY WITH A MEAL 60 tablet 7  . clonazePAM (KLONOPIN) 0.5 MG tablet TAKE 1 TABLET BY MOUTH 3 TIMES A DAY AS NEEDED 60 tablet 0  . furosemide (LASIX) 20 MG tablet Take 3 tablets (60 mg total) by mouth daily. 90 tablet 0  . glipiZIDE (GLUCOTROL) 5 MG tablet TAKE 1/2 TABLET (2.5 MG TOTAL) BY MOUTH ONCE A DAY BEFORE BREAKFAST (Patient taking differently: 2.5MG BY MOUTH ONCE DAILY BEFORE BREAKFAST) 15 tablet 12  . irbesartan (AVAPRO) 150 MG tablet TAKE 1 TABLET (150 MG TOTAL) BY MOUTH DAILY. 30 tablet 10  . metFORMIN (GLUCOPHAGE) 500 MG tablet TAKE 1 TABLET BY MOUTH TWICE DAILY WITH A MEAL 60 tablet 0  . rivaroxaban (XARELTO) 20 MG TABS tablet TAKE 1 TABLET BY MOUTH EVERY DAY WITH SUPPER 90 tablet 1  . spironolactone (ALDACTONE) 25 MG tablet Take 0.5 tablets (12.5 mg total) by mouth daily. 15 tablet 0   No current facility-administered medications for this visit.     Allergies:   Patient has no known allergies.   Social History:  The patient  reports that he quit smoking about 6 years ago. His smoking use included cigarettes. He has a 40.00 pack-year smoking history. he has never used smokeless tobacco. He reports that he drinks about 7.2 oz of alcohol per week. He reports that he does not use drugs.   Family History:  The patient's family history includes Alzheimer's disease in his mother; Cancer in his father; Drug abuse in his daughter; Heart disease in his father; Heart failure (age of onset: 41) in his father; Hyperlipidemia in his father; Hypertension  in his father; Leukemia in his brother.  ROS:  Please see the history of present illness.  All other systems are reviewed and otherwise negative.   PHYSICAL EXAM:  VS:  BP 137/90   Pulse 84   Ht _0  (1.88 m)   Wt 179 lb (81.2 kg)   BMI 22.98 kg/m  BMI: Body mass index is 22.98 kg/m. Well nourished, thin body habitus, well developed, in no acute distress  HEENT: normocephalic, atraumatic  Neck: no JVD, carotid bruits or masses Cardiac:  iRRR; no significant murmurs, no rubs, or gallops Lungs:  CTA b/l, no wheezing, rhonchi or rales  Abd: soft, nontender MS: no deformity or atrophy Ext: no edema  Skin: warm and dry, no rash Neuro:  No gross deficits appreciated  Psych: euthymic mood, full affect  S-ICD site is stable, no tethering or discomfort   EKG:  Not done today ICD interrogation done today and reviewed by myself: battery status and electrode impedance are good, no episodes.  Echo 01/30/17: Study Conclusions - Left ventricle: The cavity size was severely dilated. Systolic function was severely reduced. The estimated ejection fraction was in the range of 20% to 25%. Severe diffuse hypokinesis with distinct regional wall motion abnormalities. There is akinesis of the inferior myocardium. Doppler parameters are consistent with high ventricular filling pressure. - Mitral valve: Calcified annulus. There was mild to moderate regurgitation. - Left atrium: The atrium was severely dilated. - Pulmonary arteries: PA peak pressure: 33 mm Hg (S). - Pericardium, extracardiac: There was a left pleural effusion. Impressions: - Severely dilated LV dimensions with severe LV dysfunction with EF 20-25%, mild to moderate MR, left pleural effusion. Severe LAE. The findings indicate significant septal-lateral left ventricular wall dyssynchrony.  Cardiac cath 08/01/2013 Impression: 1. Severe triple vessel CAD s/p 5V CABG with 3/5 patent bypass grafts. The graft to the  left sided PL is occluded and the graft to the diagonal is occluded (both appear to be chronic occlusions) 2. Moderate disease in diagonal and left sided posterolateral branch, does not appear to be flow limiting.  3. Elevated filling pressures Recommendations: Continue medical management of CAD. He appears to be volume overloaded. This is most likely contributing to his dyspnea and hypoxia. Continue diuresis with IV Lasix.     Recent Labs: 01/28/2017: ALT 12; Pro B Natriuretic peptide (BNP) 1,187.0 01/29/2017: TSH 0.855 02/01/2017: Magnesium 1.9 02/03/2017: B Natriuretic Peptide 1,448.1; BUN 15; Creatinine, Ser 1.12; Hemoglobin 14.8; Platelets 156; Potassium 4.4; Sodium 140  01/30/2017: Cholesterol 89; HDL 32; LDL Cholesterol 40; Total CHOL/HDL Ratio 2.8; Triglycerides 86; VLDL 17   Estimated Creatinine Clearance: 72.5 mL/min (by C-G formula based on SCr of 1.12 mg/dL).   Wt Readings from Last 3 Encounters:  02/13/17 179 lb (81.2 kg)  02/03/17 190 lb (86.2 kg)  02/02/17 185 lb 1.6 oz (84 kg)     Other studies reviewed: Additional studies/records reviewed today include: summarized above  ASSESSMENT AND PLAN:  1. S-ICD     Intact function  2. CAD     No anginal symptoms     On ASA, statin, BB  I think he may be best served to have a general cardiologist, he liked Dr. Marlou Porch who saw him in consult in the hospital, will refer to him      3. Chronic CHF, systolic     Recent exacerbation     No symptoms or exam findings today to suggest recurrent fluid OL  BMET today to f/u  4. Persistent AFib     CHA2DS2Vasc is 5, on Xarelto     Cardiac exam is irregular today, likely still in AFib, rate controlled     He reports medicine compliance though there is some concern that he does not always take his meds?     Discussed planning for DCCV in 3 weeks with uninterrupted Xarelto.  The patient again tells me he feels good, does not want to make any changes.  Discussed potentially  contributes to HF though he wants to first see how he does and go from there.       5. HTN      No changes today, follow  6. ICM     On BB/ARB, diuretic tx     He has developed a IVCD/LBBB on  his EKG and dyssynchrony on his echo, hospital notes to d/w EP about device change     The patient does not want to consider any ICD changes, or any changes right now.       Should he start to have recurrent CHF issues, will refer to AHF team  7. Smoking     Counseled though pleasant, was not particularly receptive, states he doesn't see himself quitting    Disposition: F/u with Dr. [name]   Current medicines are reviewed at length with the patient today.  The patient did not have any concerns regarding medicines.  Venetia Night, PA-C 02/13/2017 2:47 PM     [place] [address] [phone] (office)  [phone] (fax)

## 2017-02-13 ENCOUNTER — Ambulatory Visit (INDEPENDENT_AMBULATORY_CARE_PROVIDER_SITE_OTHER): Payer: Medicare HMO | Admitting: Physician Assistant

## 2017-02-13 ENCOUNTER — Encounter (INDEPENDENT_AMBULATORY_CARE_PROVIDER_SITE_OTHER): Payer: Self-pay

## 2017-02-13 ENCOUNTER — Inpatient Hospital Stay: Payer: Medicare HMO | Admitting: Family Medicine

## 2017-02-13 ENCOUNTER — Encounter: Payer: Self-pay | Admitting: Physician Assistant

## 2017-02-13 VITALS — BP 137/90 | HR 84 | Ht 74.0 in | Wt 179.0 lb

## 2017-02-13 DIAGNOSIS — I255 Ischemic cardiomyopathy: Secondary | ICD-10-CM | POA: Diagnosis not present

## 2017-02-13 DIAGNOSIS — Z79899 Other long term (current) drug therapy: Secondary | ICD-10-CM | POA: Diagnosis not present

## 2017-02-13 DIAGNOSIS — I10 Essential (primary) hypertension: Secondary | ICD-10-CM

## 2017-02-13 DIAGNOSIS — I481 Persistent atrial fibrillation: Secondary | ICD-10-CM | POA: Diagnosis not present

## 2017-02-13 DIAGNOSIS — I4819 Other persistent atrial fibrillation: Secondary | ICD-10-CM

## 2017-02-13 DIAGNOSIS — I2510 Atherosclerotic heart disease of native coronary artery without angina pectoris: Secondary | ICD-10-CM

## 2017-02-13 DIAGNOSIS — I5022 Chronic systolic (congestive) heart failure: Secondary | ICD-10-CM

## 2017-02-13 DIAGNOSIS — Z95810 Presence of automatic (implantable) cardiac defibrillator: Secondary | ICD-10-CM

## 2017-02-13 DIAGNOSIS — F17200 Nicotine dependence, unspecified, uncomplicated: Secondary | ICD-10-CM | POA: Diagnosis not present

## 2017-02-13 NOTE — Patient Instructions (Signed)
Medication Instructions:   Your physician recommends that you continue on your current medications as directed. Please refer to the Current Medication list given to you today.   If you need a refill on your cardiac medications before your next appointment, please call your pharmacy.  Labwork: BMET TODAY    Testing/Procedures: NONE ORDERED  TODAY    Follow-Up:  WITH DR Anne Fu  NEW PT FOR GENERAL CARDIOLOGIST  DR.  KLEIN IN 3 MONTHS    Any Other Special Instructions Will Be Listed Below (If Applicable).                                                                                                                                                  \

## 2017-02-14 LAB — BASIC METABOLIC PANEL
BUN/Creatinine Ratio: 17 (ref 10–24)
BUN: 18 mg/dL (ref 8–27)
CALCIUM: 9.8 mg/dL (ref 8.6–10.2)
CO2: 25 mmol/L (ref 20–29)
CREATININE: 1.07 mg/dL (ref 0.76–1.27)
Chloride: 101 mmol/L (ref 96–106)
GFR calc Af Amer: 82 mL/min/{1.73_m2} (ref >59)
GFR, EST NON AFRICAN AMERICAN: 71 mL/min/{1.73_m2} (ref >59)
Glucose: 121 mg/dL — ABNORMAL HIGH (ref 65–99)
Potassium: 4.7 mmol/L (ref 3.5–5.2)
SODIUM: 144 mmol/L (ref 134–144)

## 2017-02-19 ENCOUNTER — Encounter: Payer: Self-pay | Admitting: Family Medicine

## 2017-02-19 ENCOUNTER — Ambulatory Visit (INDEPENDENT_AMBULATORY_CARE_PROVIDER_SITE_OTHER): Payer: Medicare HMO | Admitting: Family Medicine

## 2017-02-19 DIAGNOSIS — I10 Essential (primary) hypertension: Secondary | ICD-10-CM

## 2017-02-19 DIAGNOSIS — E785 Hyperlipidemia, unspecified: Secondary | ICD-10-CM

## 2017-02-19 DIAGNOSIS — E1169 Type 2 diabetes mellitus with other specified complication: Secondary | ICD-10-CM

## 2017-02-19 DIAGNOSIS — I5043 Acute on chronic combined systolic (congestive) and diastolic (congestive) heart failure: Secondary | ICD-10-CM

## 2017-02-19 DIAGNOSIS — E669 Obesity, unspecified: Secondary | ICD-10-CM | POA: Diagnosis not present

## 2017-02-19 NOTE — Patient Instructions (Addendum)
In about a month consider taking Shingrix which is the new shingles shot, 2 shots over 2-6 months at pharmacy  Cholesterol Cholesterol is a fat. Your body needs a small amount of cholesterol. Cholesterol (plaque) may build up in your blood vessels (arteries). That makes you more likely to have a heart attack or stroke. You cannot feel your cholesterol level. Having a blood test is the only way to find out if your level is high. Keep your test results. Work with your doctor to keep your cholesterol at a good level. What do the results mean?  Total cholesterol is how much cholesterol is in your blood.  LDL is bad cholesterol. This is the type that can build up. Try to have low LDL.  HDL is good cholesterol. It cleans your blood vessels and carries LDL away. Try to have high HDL.  Triglycerides are fat that the body can store or burn for energy. What are good levels of cholesterol?  Total cholesterol below 200.  LDL below 100 is good for people who have health risks. LDL below 70 is good for people who have very high risks.  HDL above 40 is good. It is best to have HDL of 60 or higher.  Triglycerides below 150. How can I lower my cholesterol? Diet Follow your diet program as told by your doctor.  Choose fish, white meat chicken, or [place] that is roasted or baked. Try not to eat red meat, fried foods, sausage, or lunch meats.  Eat lots of fresh fruits and vegetables.  Choose whole grains, beans, pasta, potatoes, and cereals.  Choose olive oil, corn oil, or canola oil. Only use small amounts.  Try not to eat butter, mayonnaise, shortening, or palm kernel oils.  Try not to eat foods with trans fats.  Choose low-fat or nonfat dairy foods. ? Drink skim or nonfat milk. ? Eat low-fat or nonfat yogurt and cheeses. ? Try not to drink whole milk or cream. ? Try not to eat ice cream, egg yolks, or full-fat cheeses.  Healthy desserts include angel food cake, ginger snaps, animal  crackers, hard candy, popsicles, and low-fat or nonfat frozen yogurt. Try not to eat pastries, cakes, pies, and cookies.  Exercise Follow your exercise program as told by your doctor.  Be more active. Try gardening, walking, and taking the stairs.  Ask your doctor about ways that you can be more active.  Medicine  Take over-the-counter and prescription medicines only as told by your doctor. This information is not intended to replace advice given to you by your health care provider. Make sure you discuss any questions you have with your health care provider. Document Released: 03/28/2008 Document Revised: 08/01/2015 Document Reviewed: 07/12/2015 Elsevier Interactive Patient Education  [employer].

## 2017-02-19 NOTE — Progress Notes (Signed)
Subjective:  I acted as a Education administrator for [employer]. Larry Rangel, [place]   Patient ID: Larry Rangel, male    DOB: 02/10/1950, 67 y.o.   MRN: [id number]  Chief Complaint  Patient presents with  . Follow-up    HPI  Patient is in today for hospital follow up. He feels much better, he denies any concerns since returning home. He has a follow up appointment with cardiology soon. He is tolerating his current medications. No polyuria or polydipsia. He reports his SOB and chest discomfort have not recurred since he returned home. Denies CP/palp/SOB/HA/congestion/fevers/GI or GU c/o. Taking meds as prescribed  Patient Care Team: Mosie Lukes, MD as PCP - General (Family Medicine) Deboraha Sprang, MD as PCP - Cardiology (Cardiology) Deboraha Sprang, MD as Consulting Physician (Cardiology)   Past Medical History:  Diagnosis Date  . Abnormal liver function 08/23/2010  . AICD (automatic cardioverter/defibrillator) present   . Anxiety   . Arthritis    "head to toe" (01/29/2017)  . CAD (coronary artery disease)    a. s/p CABG 2009. b. Cath 07/2013: 3/5 patent grafts (appear to have chronic occ grafts), mod diag/L PL branch, did not appear flow limiting, elevated filling pressures.  . CHF (congestive heart failure) ([place]) 07/30/2013  . Chicken pox as a child  . Chronic combined systolic and diastolic CHF (congestive heart failure) ([place])    a. Echo 6/14: Mild LVH, EF 30-35%, diffuse HK, MAC, mild BAE. b. Drop in EF to 20% by echo 07/2013.  Marland Kitchen Hyperlipidemia   . Hypertension   . LBBB (left bundle branch block)   . Low back pain   . Measles as a child  . Mumps as a child  . Myocardial infarction ([place]) 2009  . Paroxysmal atrial flutter ([place]) 07-2012   a. s/p ablation by Dr [name] 08-06-2012. b. Recurrence 07/2013.   Marland Kitchen Preventative health care 12/10/2014  . Tobacco user   . Type II diabetes mellitus ([place])     Past Surgical History:  Procedure Laterality Date  . ATRIAL FLUTTER ABLATION N/A 08/06/2012   Procedure: ATRIAL FLUTTER ABLATION;  Surgeon: Deboraha Sprang, MD;  Location: [facility] CATH LAB;  Service: Cardiovascular;  Laterality: N/A;  . CARDIAC CATHETERIZATION  2009  . CARDIOVERSION N/A 07/15/2012   Procedure: CARDIOVERSION;  Surgeon: Thayer Headings, MD;  Location: [facility] ENDOSCOPY;  Service: Cardiovascular;  Laterality: N/A;  . COLONOSCOPY    . CORONARY ARTERY BYPASS GRAFT  2009   x 5  . IMPLANTABLE CARDIOVERTER DEFIBRILLATOR IMPLANT N/A 11/10/2013   Procedure: SUB Q IMPLANTABLE CARDIOVERTER DEFIBRILLATOR IMPLANT;  Surgeon: Deboraha Sprang, MD;  Location: [facility] CATH LAB;  Service: Cardiovascular;  Laterality: N/A;  . LEFT HEART CATHETERIZATION WITH CORONARY ANGIOGRAM N/A 08/01/2013   Procedure: LEFT HEART CATHETERIZATION WITH CORONARY ANGIOGRAM;  Surgeon: Burnell Blanks, MD;  Location: [facility] CATH LAB;  Service: Cardiovascular;  Laterality: N/A;  . POCKET REVISION N/A 12/16/2013   Procedure: POCKET REVISION;  Surgeon: Evans Lance, MD;  Location: [facility] CATH LAB;  Service: Cardiovascular;  Laterality: N/A;  . SHOULDER SURGERY Left    "sewed up; sports related"  . TEE WITHOUT CARDIOVERSION N/A 07/15/2012   Procedure: TRANSESOPHAGEAL ECHOCARDIOGRAM (TEE);  Surgeon: Thayer Headings, MD;  Location: [facility] ENDOSCOPY;  Service: Cardiovascular;  Laterality: N/A;  . TONSILLECTOMY      Family History  Problem Relation Age of Onset  . Alzheimer's disease Mother   . Heart failure Father 86  . Hypertension Father   .  Hyperlipidemia Father   . Cancer Father        lung- took half of a young- smoker  . Heart disease Father        MI at 70  . Leukemia Brother   . Drug abuse Daughter        heroine  . Early death Neg Hx   . Kidney disease Neg Hx   . Stroke Neg Hx     Social History   Socioeconomic History  . Marital status: Married    Spouse name: Not on file  . Number of children: Not on file  . Years of education: Not on file  . Highest education level: Not on file  Social Needs  . Financial  resource strain: Not on file  . Food insecurity - worry: Not on file  . Food insecurity - inability: Not on file  . Transportation needs - medical: Not on file  . Transportation needs - non-medical: Not on file  Occupational History  . Not on file  Tobacco Use  . Smoking status: Former Smoker    Packs/day: 1.00    Years: 40.00    Pack years: 40.00    Types: Cigarettes    Last attempt to quit: 02/01/2011    Years since quitting: 6.0  . Smokeless tobacco: Never Used  Substance and Sexual Activity  . Alcohol use: Yes    Alcohol/week: 7.2 oz    Types: 12 Glasses of wine per week  . Drug use: No  . Sexual activity: Yes    Birth control/protection: Coitus interruptus  Other Topics Concern  . 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   He has been married four times previously, the first  For 30yr and has two children by the marriage which are grown, the second time for 11 yrs and has 2 by that marriage.   Wife has custody in [place]   The third marriage was 4 1/2 yrs and most recent marriage was the past eight weeks and he is currently estranged from that person.    Outpatient Medications Prior to Visit  Medication Sig Dispense Refill  . albuterol (PROVENTIL HFA;VENTOLIN HFA) 108 (90 BASE) MCG/ACT inhaler Inhale 2 puffs into the lungs every 6 (six) hours as needed for wheezing or shortness of breath. 1 Inhaler 2  . aspirin 81 MG EC tablet Take 1 tablet (81 mg total) by mouth daily. 30 tablet 0  . atorvastatin (LIPITOR) 20 MG tablet TAKE 1 TABLET (20 MG TOTAL) BY MOUTH DAILY. 90 tablet 0  . busPIRone (BUSPAR) 10 MG tablet TAKE 1 TABLET BY MOUTH TWICE A DAY 180 tablet 1  . carvedilol (COREG) 12.5 MG tablet TAKE 1 TABLET BY MOUTH TWICE DAILY WITH A MEAL 60 tablet 7  . clonazePAM (KLONOPIN) 0.5 MG tablet TAKE 1 TABLET BY MOUTH 3 TIMES A DAY AS  NEEDED 60 tablet 0  . furosemide (LASIX) 20 MG tablet Take 3 tablets (60 mg total) by mouth daily. 90 tablet 0  . glipiZIDE (GLUCOTROL) 5 MG tablet TAKE 1/2 TABLET (2.5 MG TOTAL) BY MOUTH ONCE A DAY BEFORE BREAKFAST (Patient taking differently: 2.5MG BY MOUTH ONCE DAILY BEFORE BREAKFAST) 15 tablet 12  . irbesartan (AVAPRO) 150 MG tablet TAKE 1 TABLET (150 MG TOTAL) BY MOUTH DAILY. 30 tablet 10  . metFORMIN (GLUCOPHAGE) 500 MG tablet TAKE  1 TABLET BY MOUTH TWICE DAILY WITH A MEAL 60 tablet 0  . rivaroxaban (XARELTO) 20 MG TABS tablet TAKE 1 TABLET BY MOUTH EVERY DAY WITH SUPPER 90 tablet 1  . spironolactone (ALDACTONE) 25 MG tablet Take 0.5 tablets (12.5 mg total) by mouth daily. 15 tablet 0   No facility-administered medications prior to visit.     No Known Allergies  Review of Systems  Constitutional: Negative for fever and malaise/fatigue.  HENT: Negative for congestion.   Eyes: Negative for blurred vision.  Respiratory: Negative for shortness of breath.   Cardiovascular: Negative for chest pain, palpitations and leg swelling.  Gastrointestinal: Negative for abdominal pain, blood in stool and nausea.  Genitourinary: Negative for dysuria and frequency.  Musculoskeletal: Negative for falls.  Skin: Negative for rash.  Neurological: Negative for dizziness, loss of consciousness and headaches.  Endo/Heme/Allergies: Negative for environmental allergies.  Psychiatric/Behavioral: Negative for depression. The patient is not nervous/anxious.        Objective:    Physical Exam  Constitutional: He is oriented to person, place, and time. He appears well-developed and well-nourished. No distress.  HENT:  Head: Normocephalic and atraumatic.  Eyes: Conjunctivae are normal.  Neck: Neck supple. No thyromegaly present.  Cardiovascular: Normal rate and regular rhythm.  Murmur heard. Pulmonary/Chest: Effort normal and breath sounds normal. No respiratory distress. He has no wheezes.  Abdominal:  Soft. Bowel sounds are normal. He exhibits no mass. There is no tenderness.  Musculoskeletal: He exhibits no edema.  Lymphadenopathy:    He has no cervical adenopathy.  Neurological: He is alert and oriented to person, place, and time.  Skin: Skin is warm and dry.  Psychiatric: He has a normal mood and affect. His behavior is normal.    BP 124/72 (BP Location: Left Arm, Patient Position: Sitting, Cuff Size: Normal)   Pulse 71   Temp 98 F (36.7 C) (Oral)   Resp 16   Ht 6' 2.02" (1.88 m)   Wt 177 lb (80.3 kg)   SpO2 98%   BMI 22.72 kg/m  Wt Readings from Last 3 Encounters:  02/19/17 177 lb (80.3 kg)  02/13/17 179 lb (81.2 kg)  02/03/17 190 lb (86.2 kg)   BP Readings from Last 3 Encounters:  02/19/17 124/72  02/13/17 137/90  02/03/17 (!) 148/109     Immunization History  Administered Date(s) Administered  . Td 01/13/2009    Health Maintenance  Topic Date Due  . PNA vac Low Risk Adult (1 of 2 - PCV13) 05/09/2013  . FOOT EXAM  11/12/2013  . OPHTHALMOLOGY EXAM  [id number]  . COLONOSCOPY  01/14/2016  . INFLUENZA VACCINE  10/22/2017 (Originally 08/13/2016)  . HEMOGLOBIN A1C  07/29/2017  . TETANUS/TDAP  01/14/2019  . Hepatitis C Screening  Completed    Lab Results  Component Value Date   WBC 11.6 (H) 02/03/2017   HGB 14.8 02/03/2017   HCT 45.5 02/03/2017   PLT 156 02/03/2017   GLUCOSE 121 (H) 02/13/2017   CHOL 89 01/30/2017   TRIG 86 01/30/2017   HDL 32 (L) 01/30/2017   LDLDIRECT 62.0 12/05/2014   LDLCALC 40 01/30/2017   ALT 12 01/28/2017   AST 15 01/28/2017   NA 144 02/13/2017   K 4.7 02/13/2017   CL 101 02/13/2017   CREATININE 1.07 02/13/2017   BUN 18 02/13/2017   CO2 25 02/13/2017   TSH 0.855 01/29/2017   PSA 1.86 04/16/2012   INR 2.88 (H) 02/14/2014   HGBA1C 4.8 01/29/2017   MICROALBUR  4.5 (H) 07/03/2015    Lab Results  Component Value Date   TSH 0.855 01/29/2017   Lab Results  Component Value Date   WBC 11.6 (H) 02/03/2017   HGB 14.8  02/03/2017   HCT 45.5 02/03/2017   MCV 95.8 02/03/2017   PLT 156 02/03/2017   Lab Results  Component Value Date   NA 144 02/13/2017   K 4.7 02/13/2017   CO2 25 02/13/2017   GLUCOSE 121 (H) 02/13/2017   BUN 18 02/13/2017   CREATININE 1.07 02/13/2017   BILITOT 2.4 (H) 01/28/2017   ALKPHOS 62 01/28/2017   AST 15 01/28/2017   ALT 12 01/28/2017   PROT 6.5 01/28/2017   ALBUMIN 3.8 01/28/2017   CALCIUM 9.8 02/13/2017   ANIONGAP 15 02/03/2017   GFR 87.87 01/28/2017   Lab Results  Component Value Date   CHOL 89 01/30/2017   Lab Results  Component Value Date   HDL 32 (L) 01/30/2017   Lab Results  Component Value Date   LDLCALC 40 01/30/2017   Lab Results  Component Value Date   TRIG 86 01/30/2017   Lab Results  Component Value Date   CHOLHDL 2.8 01/30/2017   Lab Results  Component Value Date   HGBA1C 4.8 01/29/2017         Assessment & Plan:   Problem List Items Addressed This Visit    Hyperlipidemia    Tolerating statin, encouraged heart healthy diet, avoid trans fats, minimize simple carbs and saturated fats. Increase exercise as tolerated      Essential hypertension    Well controlled, no changes to meds. Encouraged heart healthy diet such as the DASH diet and exercise as tolerated.       Diabetes mellitus type 2 in obese (HCC)     minimize simple carbs. Increase exercise as tolerated. Continue current meds      Acute on chronic combined systolic (congestive) and diastolic (congestive) heart failure (HCC)    No further flares since returning home from the hospital. He has a follow up with cardiology soon. No change in meds         I am having Larry Rangel maintain his albuterol, busPIRone, irbesartan, glipiZIDE, rivaroxaban, carvedilol, atorvastatin, metFORMIN, aspirin, furosemide, spironolactone, and clonazePAM.  No orders of the defined types were placed in this encounter.   CMA served as Education administrator during this visit. History, Physical and  Plan performed by medical provider. Documentation and orders reviewed and attested to.  Penni Homans, MD

## 2017-02-22 NOTE — Assessment & Plan Note (Signed)
Well controlled, no changes to meds. Encouraged heart healthy diet such as the DASH diet and exercise as tolerated.  °

## 2017-02-22 NOTE — Assessment & Plan Note (Signed)
Tolerating statin, encouraged heart healthy diet, avoid trans fats, minimize simple carbs and saturated fats. Increase exercise as tolerated 

## 2017-02-22 NOTE — Assessment & Plan Note (Signed)
minimize simple carbs. Increase exercise as tolerated. Continue current meds  

## 2017-02-22 NOTE — Assessment & Plan Note (Signed)
No further flares since returning home from the hospital. He has a follow up with cardiology soon. No change in meds

## 2017-02-23 ENCOUNTER — Telehealth: Payer: Self-pay | Admitting: Cardiology

## 2017-02-23 ENCOUNTER — Ambulatory Visit (INDEPENDENT_AMBULATORY_CARE_PROVIDER_SITE_OTHER): Payer: Medicare HMO | Admitting: *Deleted

## 2017-02-23 DIAGNOSIS — I255 Ischemic cardiomyopathy: Secondary | ICD-10-CM

## 2017-02-23 NOTE — Telephone Encounter (Signed)
Spoke with pt and reminded pt of remote transmission that is due today. Pt verbalized understanding.   

## 2017-02-24 NOTE — Progress Notes (Signed)
Remote ICD transmission.   

## 2017-02-25 ENCOUNTER — Encounter: Payer: Self-pay | Admitting: Cardiology

## 2017-03-03 LAB — CUP PACEART REMOTE DEVICE CHECK
Battery Remaining Percentage: 63 %
Date Time Interrogation Session: 20190211194900
Implantable Lead Implant Date: 20151029
Implantable Lead Location: 753862
Implantable Lead Model: 3401
MDC IDC PG IMPLANT DT: 20151029
MDC IDC PG SERIAL: 108164

## 2017-03-26 ENCOUNTER — Ambulatory Visit: Payer: Medicare HMO | Admitting: Cardiology

## 2017-03-27 ENCOUNTER — Encounter: Payer: Self-pay | Admitting: Cardiology

## 2017-04-29 ENCOUNTER — Other Ambulatory Visit: Payer: Self-pay | Admitting: Family Medicine

## 2017-04-30 NOTE — Telephone Encounter (Signed)
Requesting: KLONOPIN 0.5 MG Contract: 02/23/14 UDS: 02/23/14 Low risk Last OV: 02/19/17 Next OV:- Last Refill:  02/05/17    #60   Please advise

## 2017-04-30 NOTE — Telephone Encounter (Signed)
I have sent a month supply but he needs a UDS and contract before more.needs appt by August

## 2017-05-13 ENCOUNTER — Telehealth: Payer: Self-pay | Admitting: Internal Medicine

## 2017-05-13 NOTE — Telephone Encounter (Signed)
Spoke with patient and his wife and they stated that the refill will cost over $500 but it has been costing $45. I made her aware that they should contact the insurance company to investigate why the cost has went up. She stated that the patient has an appointment here tomorrow and she requested that I send a note to the nurse to make her aware that the patient would like to discuss other options that would be more affordable as she does not think that he will remember to mention this.

## 2017-05-13 NOTE — Telephone Encounter (Signed)
° ° °  Patient calling the office for samples of medication: ° ° °1.  What medication and dosage are you requesting samples for? Xarelto ° °2.  Are you currently out of this medication? no ° ° °

## 2017-05-14 ENCOUNTER — Encounter: Payer: Self-pay | Admitting: Internal Medicine

## 2017-05-14 ENCOUNTER — Ambulatory Visit: Payer: Medicare HMO | Admitting: Internal Medicine

## 2017-05-14 ENCOUNTER — Encounter (INDEPENDENT_AMBULATORY_CARE_PROVIDER_SITE_OTHER): Payer: Self-pay

## 2017-05-14 ENCOUNTER — Telehealth: Payer: Self-pay | Admitting: Internal Medicine

## 2017-05-14 VITALS — BP 142/68 | HR 82 | Ht 74.0 in | Wt 180.0 lb

## 2017-05-14 DIAGNOSIS — I5022 Chronic systolic (congestive) heart failure: Secondary | ICD-10-CM

## 2017-05-14 DIAGNOSIS — I255 Ischemic cardiomyopathy: Secondary | ICD-10-CM

## 2017-05-14 DIAGNOSIS — Z95810 Presence of automatic (implantable) cardiac defibrillator: Secondary | ICD-10-CM

## 2017-05-14 LAB — CUP PACEART INCLINIC DEVICE CHECK
Implantable Lead Location: 753862
Implantable Pulse Generator Implant Date: 20151029
MDC IDC LEAD IMPLANT DT: 20151029
MDC IDC SESS DTM: 20190502154536
Pulse Gen Serial Number: 108164

## 2017-05-14 MED ORDER — SACUBITRIL-VALSARTAN 24-26 MG PO TABS: 1.0000 | ORAL_TABLET | Freq: Two times a day (BID) | ORAL | 1 refills | Status: DC

## 2017-05-14 NOTE — Patient Instructions (Signed)
Medication Instructions:  Your physician has recommended you make the following change in your medication:   1. Discontinue Irbesartan (avapro) 2. Begin Entresto 24/26, one tablet in the AM and one tablet in the PM  Labwork: You will need to return for lab work on May 15th~ BMP  Testing/Procedures: None ordered.  Follow-Up: Your physician wants you to follow-up in: 9 months with Dr Graciela Husbands. You will receive a reminder letter in the mail two months in advance. If you don't receive a letter, please call our office to schedule the follow-up appointment.   Remote monitoring is used to monitor your Pacemaker of ICD from home. This monitoring reduces the number of office visits required to check your device to one time per year. It allows [place] to keep an eye on the functioning of your device to ensure it is working properly. You are scheduled for a device check from home on 05/25/2017. You may send your transmission at any time that day. If you have a wireless device, the transmission will be sent automatically. After your physician reviews your transmission, you will receive a postcard with your next transmission date.    Any Other Special Instructions Will Be Listed Below (If Applicable).     If you need a refill on your cardiac medications before your next appointment, please call your pharmacy.

## 2017-05-14 NOTE — Telephone Encounter (Signed)
New Message:      Pt c/o medication issue:  1. Name of Medication: Entresto  2. How are you currently taking this medication (dosage and times per day)? N/A  3. Are you having a reaction (difficulty breathing--STAT)? N/A  4. What is your medication issue? Pt states that Graciela Husbands was supposed to give him samples of Xarelto but he gave hime Entresto instead. Pt wants to know was the a mistake or is this really what he should be taken.

## 2017-05-14 NOTE — Progress Notes (Signed)
Patient Care Team: Mosie Lukes, MD as PCP - General (Family Medicine) Deboraha Sprang, MD as PCP - Cardiology (Cardiology) Deboraha Sprang, MD as Consulting Physician (Cardiology)   HPI  Larry Rangel is a 67 y.o. male Seen in followup for SICD implanted 2015 for ICM with prior CABG. Hx of Flutter s/p ablation and  Afib  On Rivaroxaban Seen by [facility] 2/19 and found to be in atrial fibrillation-persistent.  Recommended cardioversion but he was disinclined as he was feeling pretty good  DATE TEST EF   9/15  Echo  15 %   9/17 Echo  25%    1/19 Echo  20-25% LAE 47/2.2/46   Date Cr K Hgb LDL  11/17 1.05  15.5 (9/17)    2/19 1.07 4.7 14.8 40  The patient denies chest pain, shortness of breath, nocturnal dyspnea, orthopnea or peripheral edema.  There have been no palpitations, lightheadedness or syncope.   No bleeding   11/17  LDL 64  Records and Results Reviewed   Past Medical History:  Diagnosis Date  . Abnormal liver function 08/23/2010  . AICD (automatic cardioverter/defibrillator) present   . Anxiety   . Arthritis    "head to toe" (01/29/2017)  . CAD (coronary artery disease)    a. s/p CABG 2009. b. Cath 07/2013: 3/5 patent grafts (appear to have chronic occ grafts), mod diag/L PL branch, did not appear flow limiting, elevated filling pressures.  . CHF (congestive heart failure) ([place]) 07/30/2013  . Chicken pox as a child  . Chronic combined systolic and diastolic CHF (congestive heart failure) ([place])    a. Echo 6/14: Mild LVH, EF 30-35%, diffuse HK, MAC, mild BAE. b. Drop in EF to 20% by echo 07/2013.  Marland Kitchen Hyperlipidemia   . Hypertension   . LBBB (left bundle branch block)   . Low back pain   . Measles as a child  . Mumps as a child  . Myocardial infarction ([place]) 2009  . Paroxysmal atrial flutter ([place]) 07-2012   a. s/p ablation by Dr [name] 08-06-2012. b. Recurrence 07/2013.   Marland Kitchen Preventative health care 12/10/2014  . Tobacco user   . Type II diabetes mellitus  ([place])     Past Surgical History:  Procedure Laterality Date  . ATRIAL FLUTTER ABLATION N/A 08/06/2012   Procedure: ATRIAL FLUTTER ABLATION;  Surgeon: Deboraha Sprang, MD;  Location[facility] CATH LAB;  Service: Cardiovascular;  Laterality: N/A;  . CARDIAC CATHETERIZATION  2009  . CARDIOVERSION N/A 07/15/2012   Procedure: CARDIOVERSION;  Surgeon: Thayer Headings, MD;  Location: [facility] ENDOSCOPY;  Service: Cardiovascular;  Laterality: N/A;  . COLONOSCOPY    . CORONARY ARTERY BYPASS GRAFT  2009   x 5  . IMPLANTABLE CARDIOVERTER DEFIBRILLATOR IMPLANT N/A 11/10/2013   Procedure: SUB Q IMPLANTABLE CARDIOVERTER DEFIBRILLATOR IMPLANT;  Surgeon: Deboraha Sprang, MD;  Location: [facility] CATH LAB;  Service: Cardiovascular;  Laterality: N/A;  . LEFT HEART CATHETERIZATION WITH CORONARY ANGIOGRAM N/A 08/01/2013   Procedure: LEFT HEART CATHETERIZATION WITH CORONARY ANGIOGRAM;  Surgeon: Burnell Blanks, MD;  Location: [facility] CATH LAB;  Service: Cardiovascular;  Laterality: N/A;  . POCKET REVISION N/A 12/16/2013   Procedure: POCKET REVISION;  Surgeon: Evans Lance, MD;  Location: [facility] CATH LAB;  Service: Cardiovascular;  Laterality: N/A;  . SHOULDER SURGERY Left    "sewed up; sports related"  . TEE WITHOUT CARDIOVERSION N/A 07/15/2012   Procedure: TRANSESOPHAGEAL ECHOCARDIOGRAM (TEE);  Surgeon: Thayer Headings, MD;  Location: [facility] ENDOSCOPY;  Service: Cardiovascular;  Laterality: N/A;  . TONSILLECTOMY      Current Outpatient Medications  Medication Sig Dispense Refill  . albuterol (PROVENTIL HFA;VENTOLIN HFA) 108 (90 BASE) MCG/ACT inhaler Inhale 2 puffs into the lungs every 6 (six) hours as needed for wheezing or shortness of breath. 1 Inhaler 2  . atorvastatin (LIPITOR) 20 MG tablet TAKE 1 TABLET (20 MG TOTAL) BY MOUTH DAILY. 90 tablet 0  . busPIRone (BUSPAR) 10 MG tablet TAKE 1 TABLET BY MOUTH TWICE A DAY 180 tablet 1  . carvedilol (COREG) 12.5 MG tablet TAKE 1 TABLET BY MOUTH TWICE DAILY WITH A MEAL 60 tablet 7  .  clonazePAM (KLONOPIN) 0.5 MG tablet TAKE 1 TABLET BY MOUTH THREE TIMES A DAY AS NEEDED 60 tablet 0  . glipiZIDE (GLUCOTROL) 5 MG tablet TAKE 1/2 TABLET (2.5 MG TOTAL) BY MOUTH ONCE A DAY BEFORE BREAKFAST (Patient taking differently: 2.5MG BY MOUTH ONCE DAILY BEFORE BREAKFAST) 15 tablet 12  . irbesartan (AVAPRO) 150 MG tablet TAKE 1 TABLET (150 MG TOTAL) BY MOUTH DAILY. 30 tablet 10  . metFORMIN (GLUCOPHAGE) 500 MG tablet TAKE 1 TABLET BY MOUTH TWICE DAILY WITH A MEAL 60 tablet 0  . rivaroxaban (XARELTO) 20 MG TABS tablet TAKE 1 TABLET BY MOUTH EVERY DAY WITH SUPPER 90 tablet 1  . spironolactone (ALDACTONE) 25 MG tablet Take 0.5 tablets (12.5 mg total) by mouth daily. 15 tablet 0   No current facility-administered medications for this visit.     No Known Allergies    Review of Systems negative except from HPI and PMH  Physical Exam BP (!) 142/68   Pulse 82   Ht 6' 2"  (1.88 m)   Wt 180 lb (81.6 kg)   SpO2 97%   BMI 23.11 kg/m  Well developed and well nourished in no acute distress HENT normal E scleral and icterus clear Neck Supple JVP flat; carotids brisk and full Clear to ausculation Device pocket well healed; without hematoma or erythema.  There is no tethering  Regular rate and rhythm, no murmurs gallops or rub Soft with active bowel sounds No clubbing cyanosis  Edema Alert and oriented, grossly normal motor and sensory function Skin Warm and Dry  ECG   afib @ 90 -/11/39  Assessment and  Plan  Ischemic cardiomyopathy  CHF chronic systolic  Hyperlipidemia  Hypertension   Implantable defibrillator-Sub Q [employer]  Atrial fibrillation-permanent    Without symptoms of ischemia  On Anticoagulation;  No bleeding issues   No symptoms with afib so have decided to leave him there  With mod elevated BP will change from ARB >> entresto  Will need recheck BMET in about 2 weeks   LDL at target continue statin

## 2017-05-15 ENCOUNTER — Telehealth: Payer: Self-pay | Admitting: Internal Medicine

## 2017-05-15 NOTE — Telephone Encounter (Signed)
Assistance application given to patient during clinic on 5/2.

## 2017-05-15 NOTE — Telephone Encounter (Signed)
Spoke with pt's wife to clarify new orders given during pt's OV. Pt's wife understands to stop Irbesartan and begin Entresto. The two medications should not be taken on the same day. Pt's wife will also fill out financial assistance forms for both Xarelto and Entresto. She had no additional questions.

## 2017-05-15 NOTE — Telephone Encounter (Signed)
Pt c/o medication issue:  1. Name of Medication:  Furosimide (note the medications is not listed in snapshot pt wife spelled out the medication and the mg and dosage)   2. How are you currently taking this medication (dosage and times per day)? 40mg  1x day     (note the medications is not listed in snapshot pt wife spelled out the medication and the mg and dosage)  3. Are you having a reaction (difficulty breathing--STAT)? no 4. What is your medication issue? Pt was prescribed this mediation at the hospital and want to know if he should keep taking it

## 2017-05-15 NOTE — Telephone Encounter (Signed)
LVM for return call. 

## 2017-05-19 NOTE — Telephone Encounter (Signed)
LVM for return call. 

## 2017-05-22 ENCOUNTER — Encounter: Payer: Self-pay | Admitting: Family Medicine

## 2017-05-22 ENCOUNTER — Ambulatory Visit (INDEPENDENT_AMBULATORY_CARE_PROVIDER_SITE_OTHER): Payer: Medicare HMO | Admitting: Family Medicine

## 2017-05-22 DIAGNOSIS — R413 Other amnesia: Secondary | ICD-10-CM | POA: Insufficient documentation

## 2017-05-22 DIAGNOSIS — I10 Essential (primary) hypertension: Secondary | ICD-10-CM

## 2017-05-22 DIAGNOSIS — E785 Hyperlipidemia, unspecified: Secondary | ICD-10-CM

## 2017-05-22 DIAGNOSIS — E669 Obesity, unspecified: Secondary | ICD-10-CM | POA: Diagnosis not present

## 2017-05-22 DIAGNOSIS — E1169 Type 2 diabetes mellitus with other specified complication: Secondary | ICD-10-CM | POA: Diagnosis not present

## 2017-05-22 NOTE — Assessment & Plan Note (Signed)
Encouraged heart healthy diet, increase exercise, avoid trans fats, consider a krill oil cap daily 

## 2017-05-22 NOTE — Assessment & Plan Note (Signed)
hgba1c acceptable, minimize simple carbs. Increase exercise as tolerated.  

## 2017-05-22 NOTE — Assessment & Plan Note (Signed)
Well controlled, no changes to meds. Encouraged heart healthy diet such as the DASH diet and exercise as tolerated.  °

## 2017-05-22 NOTE — Patient Instructions (Signed)
Carbohydrate Counting Adult Carbohydrate counting is a method for keeping track of how many carbohydrates you eat. Eating carbohydrates naturally increases the amount of sugar (glucose) in the blood. Counting how many carbohydrates you eat helps keep your blood glucose within normal limits, which helps you manage your diabetes (diabetes mellitus). It is important to know how many carbohydrates you can safely have in each meal. This is different for every person. A diet and nutrition specialist (registered dietitian) can help you make a meal plan and calculate how many carbohydrates you should have at each meal and snack. Carbohydrates are found in the following foods:  Grains, such as breads and cereals.  Dried beans and soy products.  Starchy vegetables, such as potatoes, peas, and corn.  Fruit and fruit juices.  Milk and yogurt.  Sweets and snack foods, such as cake, cookies, candy, chips, and soft drinks.  How do I count carbohydrates? There are two ways to count carbohydrates in food. You can use either of the methods or a combination of both. Reading "Nutrition Facts" on packaged food The "Nutrition Facts" list is included on the labels of almost all packaged foods and beverages in the [place] It includes:  The serving size.  Information about nutrients in each serving, including the grams (g) of carbohydrate per serving.  To use the "Nutrition Facts":  Decide how many servings you will have.  Multiply the number of servings by the number of carbohydrates per serving.  The resulting number is the total amount of carbohydrates that you will be having.  Learning standard serving sizes of other foods When you eat foods containing carbohydrates that are not packaged or do not include "Nutrition Facts" on the label, you need to measure the servings in order to count the amount of carbohydrates:  Measure the foods that you will eat with a food scale or measuring cup, if  needed.  Decide how many standard-size servings you will eat.  Multiply the number of servings by 15. Most carbohydrate-rich foods have about 15 g of carbohydrates per serving. ? For example, if you eat 8 oz (170 g) of strawberries, you will have eaten 2 servings and 30 g of carbohydrates (2 servings x 15 g = 30 g).  For foods that have more than one food mixed, such as soups and casseroles, you must count the carbohydrates in each food that is included.  The following list contains standard serving sizes of common carbohydrate-rich foods. Each of these servings has about 15 g of carbohydrates:   hamburger bun or  English muffin.   oz (15 mL) syrup.   oz (14 g) jelly.  1 slice of bread.  1 six-inch tortilla.  3 oz (85 g) cooked rice or pasta.  4 oz (113 g) cooked dried beans.  4 oz (113 g) starchy vegetable, such as peas, corn, or potatoes.  4 oz (113 g) hot cereal.  4 oz (113 g) mashed potatoes or  of a large baked potato.  4 oz (113 g) canned or frozen fruit.  4 oz (120 mL) fruit juice.  4-6 crackers.  6 chicken nuggets.  6 oz (170 g) unsweetened dry cereal.  6 oz (170 g) plain fat-free yogurt or yogurt sweetened with artificial sweeteners.  8 oz (240 mL) milk.  8 oz (170 g) fresh fruit or one small piece of fruit.  24 oz (680 g) popped popcorn.  Example of carbohydrate counting Sample meal  3 oz (85 g) chicken breast.  6 oz (  170 g) brown rice.  4 oz (113 g) corn.  8 oz (240 mL) milk.  8 oz (170 g) strawberries with sugar-free whipped topping. Carbohydrate calculation 1. Identify the foods that contain carbohydrates: ? Rice. ? Corn. ? Milk. ? Strawberries. 2. Calculate how many servings you have of each food: ? 2 servings rice. ? 1 serving corn. ? 1 serving milk. ? 1 serving strawberries. 3. Multiply each number of servings by 15 g: ? 2 servings rice x 15 g = 30 g. ? 1 serving corn x 15 g = 15 g. ? 1 serving milk x 15 g = 15 g. ? 1  serving strawberries x 15 g = 15 g. 4. Add together all of the amounts to find the total grams of carbohydrates eaten: ? 30 g + 15 g + 15 g + 15 g = 75 g of carbohydrates total. This information is not intended to replace advice given to you by your health care provider. Make sure you discuss any questions you have with your health care provider. Document Released: 12/30/2004 Document Revised: 07/20/2015 Document Reviewed: 06/13/2015 Elsevier Interactive Patient Education  2018 Elsevier Inc.  

## 2017-05-22 NOTE — Progress Notes (Signed)
Subjective:  I acted as a Education administrator for Dr. Charlett Blake. Lorita Forinash, [place]  Patient ID: Larry Rangel, male    DOB: 1950/03/11, 67 y.o.   MRN: [id number]  No chief complaint on file.   HPI  Patient is in today for an acute visit for some memory loss. He is in today because his wife is concerned about his memory. The patient himself denies any concerns regarding his memory and he reports he has not trouble with getting lost or completing tasks he needs to complete. He feels it has more to do with paying attention than forgetting. Denies CP/palp/SOB/HA/congestion/fevers/GI or GU c/o. Taking meds as prescribed. No polyuria or polydipsia.   Patient Care Team: Mosie Lukes, MD as PCP - General (Family Medicine) Deboraha Sprang, MD as PCP - Cardiology (Cardiology) Deboraha Sprang, MD as Consulting Physician (Cardiology)   Past Medical History:  Diagnosis Date  . Abnormal liver function 08/23/2010  . AICD (automatic cardioverter/defibrillator) present   . Anxiety   . Arthritis    "head to toe" (01/29/2017)  . CAD (coronary artery disease)    a. s/p CABG 2009. b. Cath 07/2013: 3/5 patent grafts (appear to have chronic occ grafts), mod diag/L PL branch, did not appear flow limiting, elevated filling pressures.  . CHF (congestive heart failure) ([place]) 07/30/2013  . Chicken pox as a child  . Chronic combined systolic and diastolic CHF (congestive heart failure) ([place])    a. Echo 6/14: Mild LVH, EF 30-35%, diffuse HK, MAC, mild BAE. b. Drop in EF to 20% by echo 07/2013.  Marland Kitchen Hyperlipidemia   . Hypertension   . LBBB (left bundle branch block)   . Low back pain   . Measles as a child  . Mumps as a child  . Myocardial infarction ([place]) 2009  . Paroxysmal atrial flutter ([place]) 07-2012   a. s/p ablation by Dr [name] 08-06-2012. b. Recurrence 07/2013.   Marland Kitchen Preventative health care 12/10/2014  . Tobacco user   . Type II diabetes mellitus ([place])     Past Surgical History:  Procedure Laterality Date  . ATRIAL  FLUTTER ABLATION N/A 08/06/2012   Procedure: ATRIAL FLUTTER ABLATION;  Surgeon: Deboraha Sprang, MD;  Location: [facility] CATH LAB;  Service: Cardiovascular;  Laterality: N/A;  . CARDIAC CATHETERIZATION  2009  . CARDIOVERSION N/A 07/15/2012   Procedure: CARDIOVERSION;  Surgeon: Thayer Headings, MD;  Location: [facility] ENDOSCOPY;  Service: Cardiovascular;  Laterality: N/A;  . COLONOSCOPY    . CORONARY ARTERY BYPASS GRAFT  2009   x 5  . IMPLANTABLE CARDIOVERTER DEFIBRILLATOR IMPLANT N/A 11/10/2013   Procedure: SUB Q IMPLANTABLE CARDIOVERTER DEFIBRILLATOR IMPLANT;  Surgeon: Deboraha Sprang, MD;  Location: [facility] CATH LAB;  Service: Cardiovascular;  Laterality: N/A;  . LEFT HEART CATHETERIZATION WITH CORONARY ANGIOGRAM N/A 08/01/2013   Procedure: LEFT HEART CATHETERIZATION WITH CORONARY ANGIOGRAM;  Surgeon: Burnell Blanks, MD;  Location: [facility] CATH LAB;  Service: Cardiovascular;  Laterality: N/A;  . POCKET REVISION N/A 12/16/2013   Procedure: POCKET REVISION;  Surgeon: Evans Lance, MD;  Location: [facility] CATH LAB;  Service: Cardiovascular;  Laterality: N/A;  . SHOULDER SURGERY Left    "sewed up; sports related"  . TEE WITHOUT CARDIOVERSION N/A 07/15/2012   Procedure: TRANSESOPHAGEAL ECHOCARDIOGRAM (TEE);  Surgeon: Thayer Headings, MD;  Location: [facility] ENDOSCOPY;  Service: Cardiovascular;  Laterality: N/A;  . TONSILLECTOMY      Family History  Problem Relation Age of Onset  . Alzheimer's disease Mother   .  Heart failure Father 87  . Hypertension Father   . Hyperlipidemia Father   . Cancer Father        lung- took half of a young- smoker  . Heart disease Father        MI at 87  . Leukemia Brother   . Drug abuse Daughter        heroine  . Early death Neg Hx   . Kidney disease Neg Hx   . Stroke Neg Hx     Social History   Socioeconomic History  . Marital status: Married    Spouse name: Not on file  . Number of children: Not on file  . Years of education: Not on file  . Highest education level: Not on  file  Occupational History  . Not on file  Social Needs  . Financial resource strain: Not on file  . Food insecurity:    Worry: Not on file    Inability: Not on file  . Transportation needs:    Medical: Not on file    Non-medical: Not on file  Tobacco Use  . Smoking status: Former Smoker    Packs/day: 1.00    Years: 40.00    Pack years: 40.00    Types: Cigarettes    Last attempt to quit: 02/01/2011    Years since quitting: 6.3  . Smokeless tobacco: Never Used  Substance and Sexual Activity  . Alcohol use: Yes    Alcohol/week: 7.2 oz    Types: 12 Glasses of wine per week  . Drug use: No  . Sexual activity: Yes    Birth control/protection: Coitus interruptus  Lifestyle  . Physical activity:    Days per week: Not on file    Minutes per session: Not on file  . Stress: Not on file  Relationships  . Social connections:    Talks on phone: Not on file    Gets together: Not on file    Attends religious service: Not on file    Active member of club or organization: Not on file    Attends meetings of clubs or organizations: Not on file    Relationship status: Not on file  . Intimate partner violence:    Fear of current or ex partner: Not on file    Emotionally abused: Not on file    Physically abused: Not on file    Forced sexual activity: Not on file  Other Topics Concern  . 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   He has been married four times previously, the first  For 33yr and has two children by the marriage which are grown, the second time for 11 yrs and has 2 by that marriage.   Wife has custody in [place]   The third marriage was 4 1/2 yrs and most recent marriage was the past eight weeks and he is currently estranged from that person.    Outpatient Medications Prior to Visit  Medication Sig Dispense Refill  . albuterol  (PROVENTIL HFA;VENTOLIN HFA) 108 (90 BASE) MCG/ACT inhaler Inhale 2 puffs into the lungs every 6 (six) hours as needed for wheezing or shortness of breath. 1 Inhaler 2  . atorvastatin (LIPITOR) 20 MG tablet TAKE 1 TABLET (20 MG TOTAL) BY MOUTH DAILY. 90 tablet 0  . busPIRone (BUSPAR) 10 MG tablet TAKE 1 TABLET  BY MOUTH TWICE A DAY 180 tablet 1  . carvedilol (COREG) 12.5 MG tablet TAKE 1 TABLET BY MOUTH TWICE DAILY WITH A MEAL 60 tablet 7  . clonazePAM (KLONOPIN) 0.5 MG tablet TAKE 1 TABLET BY MOUTH THREE TIMES A DAY AS NEEDED 60 tablet 0  . glipiZIDE (GLUCOTROL) 5 MG tablet TAKE 1/2 TABLET (2.5 MG TOTAL) BY MOUTH ONCE A DAY BEFORE BREAKFAST (Patient taking differently: 2.5MG BY MOUTH ONCE DAILY BEFORE BREAKFAST) 15 tablet 12  . metFORMIN (GLUCOPHAGE) 500 MG tablet TAKE 1 TABLET BY MOUTH TWICE DAILY WITH A MEAL 60 tablet 0  . rivaroxaban (XARELTO) 20 MG TABS tablet TAKE 1 TABLET BY MOUTH EVERY DAY WITH SUPPER 90 tablet 1  . sacubitril-valsartan (ENTRESTO) 24-26 MG Take 1 tablet by mouth 2 (two) times daily. 60 tablet 1  . spironolactone (ALDACTONE) 25 MG tablet Take 0.5 tablets (12.5 mg total) by mouth daily. 15 tablet 0   No facility-administered medications prior to visit.     No Known Allergies  Review of Systems  Constitutional: Negative for fever and malaise/fatigue.  HENT: Negative for congestion.   Eyes: Negative for blurred vision.  Respiratory: Negative for shortness of breath.   Cardiovascular: Negative for chest pain, palpitations and leg swelling.  Gastrointestinal: Negative for abdominal pain, blood in stool and nausea.  Genitourinary: Negative for dysuria and frequency.  Musculoskeletal: Negative for falls.  Skin: Negative for rash.  Neurological: Negative for dizziness, loss of consciousness and headaches.  Endo/Heme/Allergies: Negative for environmental allergies.  Psychiatric/Behavioral: Positive for memory loss. Negative for depression. The patient is not nervous/anxious.         Objective:    Physical Exam  Constitutional: He is oriented to person, place, and time. He appears well-developed and well-nourished. No distress.  HENT:  Head: Normocephalic and atraumatic.  Nose: Nose normal.  Eyes: Right eye exhibits no discharge. Left eye exhibits no discharge.  Neck: Normal range of motion. Neck supple.  Cardiovascular: Normal rate and regular rhythm.  No murmur heard. Pulmonary/Chest: Effort normal and breath sounds normal.  Abdominal: Soft. Bowel sounds are normal. There is no tenderness.  Musculoskeletal: He exhibits no edema.  Neurological: He is alert and oriented to person, place, and time.  Skin: Skin is warm and dry.  Psychiatric: He has a normal mood and affect.  Nursing note and vitals reviewed.   There were no vitals taken for this visit. Wt Readings from Last 3 Encounters:  05/14/17 180 lb (81.6 kg)  02/19/17 177 lb (80.3 kg)  02/13/17 179 lb (81.2 kg)   BP Readings from Last 3 Encounters:  05/14/17 (!) 142/68  02/19/17 124/72  02/13/17 137/90     Immunization History  Administered Date(s) Administered  . Td 01/13/2009    Health Maintenance  Topic Date Due  . PNA vac Low Risk Adult (1 of 2 - PCV13) 05/09/2013  . FOOT EXAM  11/12/2013  . OPHTHALMOLOGY EXAM  [id number]  . COLONOSCOPY  01/14/2016  . URINE MICROALBUMIN  07/02/2016  . INFLUENZA VACCINE  10/22/2017 (Originally 08/13/2017)  . HEMOGLOBIN A1C  07/29/2017  . TETANUS/TDAP  01/14/2019  . Hepatitis C Screening  Completed    Lab Results  Component Value Date   WBC 11.6 (H) 02/03/2017   HGB 14.8 02/03/2017   HCT 45.5 02/03/2017   PLT 156 02/03/2017   GLUCOSE 121 (H) 02/13/2017   CHOL 89 01/30/2017   TRIG 86 01/30/2017   HDL 32 (L) 01/30/2017   LDLDIRECT 62.0 12/05/2014  LDLCALC 40 01/30/2017   ALT 12 01/28/2017   AST 15 01/28/2017   NA 144 02/13/2017   K 4.7 02/13/2017   CL 101 02/13/2017   CREATININE 1.07 02/13/2017   BUN 18 02/13/2017   CO2 25  02/13/2017   TSH 0.855 01/29/2017   PSA 1.86 04/16/2012   INR 2.88 (H) 02/14/2014   HGBA1C 4.8 01/29/2017   MICROALBUR 4.5 (H) 07/03/2015    Lab Results  Component Value Date   TSH 0.855 01/29/2017   Lab Results  Component Value Date   WBC 11.6 (H) 02/03/2017   HGB 14.8 02/03/2017   HCT 45.5 02/03/2017   MCV 95.8 02/03/2017   PLT 156 02/03/2017   Lab Results  Component Value Date   NA 144 02/13/2017   K 4.7 02/13/2017   CO2 25 02/13/2017   GLUCOSE 121 (H) 02/13/2017   BUN 18 02/13/2017   CREATININE 1.07 02/13/2017   BILITOT 2.4 (H) 01/28/2017   ALKPHOS 62 01/28/2017   AST 15 01/28/2017   ALT 12 01/28/2017   PROT 6.5 01/28/2017   ALBUMIN 3.8 01/28/2017   CALCIUM 9.8 02/13/2017   ANIONGAP 15 02/03/2017   GFR 87.87 01/28/2017   Lab Results  Component Value Date   CHOL 89 01/30/2017   Lab Results  Component Value Date   HDL 32 (L) 01/30/2017   Lab Results  Component Value Date   LDLCALC 40 01/30/2017   Lab Results  Component Value Date   TRIG 86 01/30/2017   Lab Results  Component Value Date   CHOLHDL 2.8 01/30/2017   Lab Results  Component Value Date   HGBA1C 4.8 01/29/2017         Assessment & Plan:   Problem List Items Addressed This Visit    None      I am having Edgerrin L. Tagliaferri maintain his albuterol, busPIRone, glipiZIDE, rivaroxaban, carvedilol, atorvastatin, metFORMIN, spironolactone, clonazePAM, and sacubitril-valsartan.  No orders of the defined types were placed in this encounter.   CMA served as Education administrator during this visit. History, Physical and Plan performed by medical provider. Documentation and orders reviewed and attested to.  Magdalene Molly, [place]

## 2017-05-22 NOTE — Assessment & Plan Note (Addendum)
He is in today because his wife is concerned about his memory. The patient himself denies any concerns regarding his memory and he reports he has not trouble with getting lost or completing tasks he needs to complete. His MMSE today is 26/30. Discussed options including referral to neurology for further work up. For now he is encouraged to learn something new. Continue to exercise, eat a heart healthy diet and get adequate sleep. He will let us know if he is ready for referral

## 2017-05-25 ENCOUNTER — Ambulatory Visit (INDEPENDENT_AMBULATORY_CARE_PROVIDER_SITE_OTHER): Payer: Medicare HMO | Admitting: *Deleted

## 2017-05-25 DIAGNOSIS — I255 Ischemic cardiomyopathy: Secondary | ICD-10-CM | POA: Diagnosis not present

## 2017-05-26 LAB — CUP PACEART REMOTE DEVICE CHECK
Date Time Interrogation Session: 20190513155400
Implantable Lead Location: 753862
MDC IDC LEAD IMPLANT DT: 20151029
MDC IDC MSMT BATTERY REMAINING PERCENTAGE: 60 %
MDC IDC PG IMPLANT DT: 20151029
Pulse Gen Serial Number: 108164

## 2017-05-26 NOTE — Progress Notes (Signed)
Remote ICD transmission.   

## 2017-05-27 ENCOUNTER — Other Ambulatory Visit: Payer: Medicare HMO | Admitting: *Deleted

## 2017-05-27 ENCOUNTER — Encounter: Payer: Self-pay | Admitting: Cardiology

## 2017-05-27 DIAGNOSIS — I5022 Chronic systolic (congestive) heart failure: Secondary | ICD-10-CM

## 2017-05-27 DIAGNOSIS — I255 Ischemic cardiomyopathy: Secondary | ICD-10-CM

## 2017-05-27 DIAGNOSIS — Z95810 Presence of automatic (implantable) cardiac defibrillator: Secondary | ICD-10-CM | POA: Diagnosis not present

## 2017-05-27 LAB — BASIC METABOLIC PANEL
BUN/Creatinine Ratio: 11 (ref 10–24)
BUN: 11 mg/dL (ref 8–27)
CALCIUM: 9.3 mg/dL (ref 8.6–10.2)
CO2: 21 mmol/L (ref 20–29)
CREATININE: 1.03 mg/dL (ref 0.76–1.27)
Chloride: 107 mmol/L — ABNORMAL HIGH (ref 96–106)
GFR calc Af Amer: 85 mL/min/{1.73_m2} (ref >59)
GFR, EST NON AFRICAN AMERICAN: 74 mL/min/{1.73_m2} (ref >59)
GLUCOSE: 107 mg/dL — AB (ref 65–99)
Potassium: 4.4 mmol/L (ref 3.5–5.2)
SODIUM: 142 mmol/L (ref 134–144)

## 2017-05-27 NOTE — Progress Notes (Deleted)
Subjective:   Larry Rangel is a 67 y.o. male who presents for Medicare Annual/Subsequent preventive examination.  Review of Systems: No ROS.  Medicare Wellness Visit. Additional risk factors are reflected in the social history.   Sleep patterns:  Home Safety/Smoke Alarms: Feels safe in home. Smoke alarms in place.  Living environment; residence and Firearm Safety:   Male:   CCS-     PSA-  Lab Results  Component Value Date   PSA 1.86 04/16/2012   PSA 0.85 08/09/2010       Objective:    Vitals: There were no vitals taken for this visit.  There is no height or weight on file to calculate BMI.  Advanced Directives 02/03/2017 01/29/2017 05/29/2016 12/18/2015 01/16/2015 11/24/2014 02/13/2014  Does Patient Have a Medical Advance Directive? No No Yes Yes No No No  Type of Advance Directive - Public librarian;Living will [place];Living will - - -  Does patient want to make changes to medical advance directive? - - No - Patient declined No - Patient declined - No - Patient declined -  Copy of [place] in Chart? - - No - copy requested No - copy requested - - -  Would patient like information on creating a medical advance directive? Yes (Inpatient - patient defers creating a medical advance directive at this time) Yes (Inpatient - patient defers creating a medical advance directive at this time) - - No - patient declined information - No - patient declined information  Pre-existing out of facility DNR order (yellow form or pink MOST form) - - - - - - -    Tobacco Social History   Tobacco Use  Smoking Status Former Smoker  . Packs/day: 1.00  . Years: 40.00  . Pack years: 40.00  . Types: Cigarettes  . Last attempt to quit: 02/01/2011  . Years since quitting: 6.3  Smokeless Tobacco Never Used     Counseling given: Not Answered   Clinical Intake:                       Past Medical History:  Diagnosis Date  .  Abnormal liver function 08/23/2010  . AICD (automatic cardioverter/defibrillator) present   . Anxiety   . Arthritis    "head to toe" (01/29/2017)  . CAD (coronary artery disease)    a. s/p CABG 2009. b. Cath 07/2013: 3/5 patent grafts (appear to have chronic occ grafts), mod diag/L PL branch, did not appear flow limiting, elevated filling pressures.  . CHF (congestive heart failure) ([place]) 07/30/2013  . Chicken pox as a child  . Chronic combined systolic and diastolic CHF (congestive heart failure) ([place])    a. Echo 6/14: Mild LVH, EF 30-35%, diffuse HK, MAC, mild BAE. b. Drop in EF to 20% by echo 07/2013.  Marland Kitchen Hyperlipidemia   . Hypertension   . LBBB (left bundle branch block)   . Low back pain   . Measles as a child  . Mumps as a child  . Myocardial infarction ([place]) 2009  . Paroxysmal atrial flutter ([place]) 07-2012   a. s/p ablation by Dr [name] 08-06-2012. b. Recurrence 07/2013.   Marland Kitchen Preventative health care 12/10/2014  . Tobacco user   . Type II diabetes mellitus ([place])    Past Surgical History:  Procedure Laterality Date  . ATRIAL FLUTTER ABLATION N/A 08/06/2012   Procedure: ATRIAL FLUTTER ABLATION;  Surgeon: Deboraha Sprang, MD;  Location: [facility]  CATH LAB;  Service: Cardiovascular;  Laterality: N/A;  . CARDIAC CATHETERIZATION  2009  . CARDIOVERSION N/A 07/15/2012   Procedure: CARDIOVERSION;  Surgeon: Thayer Headings, MD;  Location: [facility] ENDOSCOPY;  Service: Cardiovascular;  Laterality: N/A;  . COLONOSCOPY    . CORONARY ARTERY BYPASS GRAFT  2009   x 5  . IMPLANTABLE CARDIOVERTER DEFIBRILLATOR IMPLANT N/A 11/10/2013   Procedure: SUB Q IMPLANTABLE CARDIOVERTER DEFIBRILLATOR IMPLANT;  Surgeon: Deboraha Sprang, MD;  Location: [facility] CATH LAB;  Service: Cardiovascular;  Laterality: N/A;  . LEFT HEART CATHETERIZATION WITH CORONARY ANGIOGRAM N/A 08/01/2013   Procedure: LEFT HEART CATHETERIZATION WITH CORONARY ANGIOGRAM;  Surgeon: Burnell Blanks, MD;  Location: [facility] CATH LAB;  Service: Cardiovascular;   Laterality: N/A;  . POCKET REVISION N/A 12/16/2013   Procedure: POCKET REVISION;  Surgeon: Evans Lance, MD;  Location: [facility] CATH LAB;  Service: Cardiovascular;  Laterality: N/A;  . SHOULDER SURGERY Left    "sewed up; sports related"  . TEE WITHOUT CARDIOVERSION N/A 07/15/2012   Procedure: TRANSESOPHAGEAL ECHOCARDIOGRAM (TEE);  Surgeon: Thayer Headings, MD;  Location: [facility] ENDOSCOPY;  Service: Cardiovascular;  Laterality: N/A;  . TONSILLECTOMY     Family History  Problem Relation Age of Onset  . Alzheimer's disease Mother   . Heart failure Father 56  . Hypertension Father   . Hyperlipidemia Father   . Cancer Father        lung- took half of a young- smoker  . Heart disease Father        MI at 39  . Leukemia Brother   . Drug abuse Daughter        heroine  . Early death Neg Hx   . Kidney disease Neg Hx   . Stroke Neg Hx    Social History   Socioeconomic History  . Marital status: Married    Spouse name: Not on file  . Number of children: Not on file  . Years of education: Not on file  . Highest education level: Not on file  Occupational History  . Not on file  Social Needs  . Financial resource strain: Not on file  . Food insecurity:    Worry: Not on file    Inability: Not on file  . Transportation needs:    Medical: Not on file    Non-medical: Not on file  Tobacco Use  . Smoking status: Former Smoker    Packs/day: 1.00    Years: 40.00    Pack years: 40.00    Types: Cigarettes    Last attempt to quit: 02/01/2011    Years since quitting: 6.3  . Smokeless tobacco: Never Used  Substance and Sexual Activity  . Alcohol use: Yes    Alcohol/week: 7.2 oz    Types: 12 Glasses of wine per week  . Drug use: No  . Sexual activity: Yes    Birth control/protection: Coitus interruptus  Lifestyle  . Physical activity:    Days per week: Not on file    Minutes per session: Not on file  . Stress: Not on file  Relationships  . Social connections:    Talks on phone: Not on file      Gets together: Not on file    Attends religious service: Not on file    Active member of club or organization: Not on file    Attends meetings of clubs or organizations: Not on file    Relationship status: Not on file  Other Topics Concern  .  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   He has been married four times previously, the first  For 64yr and has two children by the marriage which are grown, the second time for 11 yrs and has 2 by that marriage.   Wife has custody in [place]   The third marriage was 4 1/2 yrs and most recent marriage was the past eight weeks and he is currently estranged from that person.    Outpatient Encounter Medications as of 06/01/2017  Medication Sig  . albuterol (PROVENTIL HFA;VENTOLIN HFA) 108 (90 BASE) MCG/ACT inhaler Inhale 2 puffs into the lungs every 6 (six) hours as needed for wheezing or shortness of breath.  .Marland Kitchenatorvastatin (LIPITOR) 20 MG tablet TAKE 1 TABLET (20 MG TOTAL) BY MOUTH DAILY.  . busPIRone (BUSPAR) 10 MG tablet TAKE 1 TABLET BY MOUTH TWICE A DAY  . carvedilol (COREG) 12.5 MG tablet TAKE 1 TABLET BY MOUTH TWICE DAILY WITH A MEAL  . clonazePAM (KLONOPIN) 0.5 MG tablet TAKE 1 TABLET BY MOUTH THREE TIMES A DAY AS NEEDED  . glipiZIDE (GLUCOTROL) 5 MG tablet TAKE 1/2 TABLET (2.5 MG TOTAL) BY MOUTH ONCE A DAY BEFORE BREAKFAST (Patient taking differently: 2.5MG BY MOUTH ONCE DAILY BEFORE BREAKFAST)  . metFORMIN (GLUCOPHAGE) 500 MG tablet TAKE 1 TABLET BY MOUTH TWICE DAILY WITH A MEAL  . rivaroxaban (XARELTO) 20 MG TABS tablet TAKE 1 TABLET BY MOUTH EVERY DAY WITH SUPPER  . sacubitril-valsartan (ENTRESTO) 24-26 MG Take 1 tablet by mouth 2 (two) times daily.  .Marland Kitchenspironolactone (ALDACTONE) 25 MG tablet Take 0.5 tablets (12.5 mg total) by mouth daily.   No facility-administered encounter medications on file  as of 06/01/2017.     Activities of Daily Living In your present state of health, do you have any difficulty performing the following activities: 01/29/2017 05/29/2016  Hearing? N N  Vision? N N  Difficulty concentrating or making decisions? N N  Walking or climbing stairs? Y N  Comment "get out of breath when he climbs stairs" -  Dressing or bathing? N N  Doing errands, shopping? N N  Preparing Food and eating ? - N  Using the Toilet? - N  In the past six months, have you accidently leaked urine? - N  Do you have problems with loss of bowel control? - N  Managing your Medications? - N  Managing your Finances? - N  Housekeeping or managing your Housekeeping? - N  Some recent data might be hidden    Patient Care Team: BMosie Lukes MD as PCP - General (Family Medicine) KDeboraha Sprang MD as PCP - Cardiology (Cardiology) KDeboraha Sprang MD as Consulting Physician (Cardiology)   Assessment:   This is a routine wellness examination for Kayron. Physical assessment deferred to PCP.  Exercise Activities and Dietary recommendations   Diet (meal preparation, eat out, water intake, caffeinated beverages, dairy products, fruits and vegetables): {Desc; diets:16563} Breakfast: Lunch:  Dinner:      Goals    . Complete Eye Exam by the end of next year.   (pt-stated)       Fall Risk Fall Risk  05/29/2016 12/18/2015 11/24/2014 06/12/2013 06/07/2013  Falls in the past year? No No No No No   Depression Screen PHQ 2/9 Scores 05/29/2016 12/18/2015 11/24/2014 06/12/2013  PHQ - 2 Score 0 0 0  0    Cognitive Function MMSE - Mini Mental State Exam 11/24/2014  Orientation to time 5  Orientation to Place 5  Registration 3  Attention/ Calculation 5  Recall 2  Language- name 2 objects 2  Language- repeat 1  Language- follow 3 step command 3  Language- read & follow direction 1  Write a sentence 1  Copy design 1  Total score 29        Immunization History  Administered Date(s)  Administered  . Td 01/13/2009   Screening Tests Health Maintenance  Topic Date Due  . PNA vac Low Risk Adult (1 of 2 - PCV13) 05/09/2013  . FOOT EXAM  11/12/2013  . OPHTHALMOLOGY EXAM  1Dec 04, 202017  . COLONOSCOPY  01/14/2016  . URINE MICROALBUMIN  07/02/2016  . INFLUENZA VACCINE  10/22/2017 (Originally 08/13/2017)  . HEMOGLOBIN A1C  07/29/2017  . TETANUS/TDAP  01/14/2019  . Hepatitis C Screening  Completed       Plan:   ***  I have personally reviewed and noted the following in the patient's chart:   . Medical and social history . Use of alcohol, tobacco or illicit drugs  . Current medications and supplements . Functional ability and status . Nutritional status . Physical activity . Advanced directives . List of other physicians . Hospitalizations, surgeries, and ER visits in previous 12 months . Vitals . Screenings to include cognitive, depression, and falls . Referrals and appointments  In addition, I have reviewed and discussed with patient certain preventive protocols, quality metrics, and best practice recommendations. A written personalized care plan for preventive services as well as general preventive health recommendations were provided to patient.     Shela Nevin, [place]  05/27/2017

## 2017-06-01 ENCOUNTER — Telehealth: Payer: Self-pay

## 2017-06-01 ENCOUNTER — Encounter (HOSPITAL_COMMUNITY): Payer: Self-pay | Admitting: Emergency Medicine

## 2017-06-01 ENCOUNTER — Emergency Department (HOSPITAL_COMMUNITY): Payer: Medicare HMO

## 2017-06-01 ENCOUNTER — Ambulatory Visit: Payer: Medicare HMO | Admitting: *Deleted

## 2017-06-01 ENCOUNTER — Inpatient Hospital Stay (HOSPITAL_COMMUNITY)
Admission: EM | Admit: 2017-06-01 | Discharge: 2017-06-03 | DRG: 292 | Disposition: A | Discharge status: Home / Self Care | Payer: Medicare HMO | Attending: Cardiovascular Disease | Admitting: Cardiovascular Disease

## 2017-06-01 ENCOUNTER — Telehealth: Payer: Self-pay | Admitting: Internal Medicine

## 2017-06-01 ENCOUNTER — Other Ambulatory Visit: Payer: Self-pay

## 2017-06-01 DIAGNOSIS — Z87891 Personal history of nicotine dependence: Secondary | ICD-10-CM

## 2017-06-01 DIAGNOSIS — I447 Left bundle-branch block, unspecified: Secondary | ICD-10-CM | POA: Diagnosis present

## 2017-06-01 DIAGNOSIS — M1990 Unspecified osteoarthritis, unspecified site: Secondary | ICD-10-CM | POA: Diagnosis present

## 2017-06-01 DIAGNOSIS — Z9114 Patient's other noncompliance with medication regimen: Secondary | ICD-10-CM

## 2017-06-01 DIAGNOSIS — I4892 Unspecified atrial flutter: Secondary | ICD-10-CM | POA: Diagnosis not present

## 2017-06-01 DIAGNOSIS — E1169 Type 2 diabetes mellitus with other specified complication: Secondary | ICD-10-CM | POA: Diagnosis not present

## 2017-06-01 DIAGNOSIS — I5043 Acute on chronic combined systolic (congestive) and diastolic (congestive) heart failure: Secondary | ICD-10-CM | POA: Diagnosis not present

## 2017-06-01 DIAGNOSIS — I130 Hypertensive heart and chronic kidney disease with heart failure and stage 1 through stage 4 chronic kidney disease, or unspecified chronic kidney disease: Secondary | ICD-10-CM | POA: Diagnosis not present

## 2017-06-01 DIAGNOSIS — R748 Abnormal levels of other serum enzymes: Secondary | ICD-10-CM | POA: Diagnosis not present

## 2017-06-01 DIAGNOSIS — E119 Type 2 diabetes mellitus without complications: Secondary | ICD-10-CM | POA: Diagnosis present

## 2017-06-01 DIAGNOSIS — Z7984 Long term (current) use of oral hypoglycemic drugs: Secondary | ICD-10-CM

## 2017-06-01 DIAGNOSIS — M545 Low back pain: Secondary | ICD-10-CM | POA: Diagnosis present

## 2017-06-01 DIAGNOSIS — I5042 Chronic combined systolic (congestive) and diastolic (congestive) heart failure: Secondary | ICD-10-CM | POA: Diagnosis present

## 2017-06-01 DIAGNOSIS — I509 Heart failure, unspecified: Secondary | ICD-10-CM

## 2017-06-01 DIAGNOSIS — I481 Persistent atrial fibrillation: Secondary | ICD-10-CM

## 2017-06-01 DIAGNOSIS — Z7901 Long term (current) use of anticoagulants: Secondary | ICD-10-CM | POA: Diagnosis not present

## 2017-06-01 DIAGNOSIS — F419 Anxiety disorder, unspecified: Secondary | ICD-10-CM | POA: Diagnosis present

## 2017-06-01 DIAGNOSIS — Z79899 Other long term (current) drug therapy: Secondary | ICD-10-CM

## 2017-06-01 DIAGNOSIS — Z9111 Patient's noncompliance with dietary regimen: Secondary | ICD-10-CM

## 2017-06-01 DIAGNOSIS — I480 Paroxysmal atrial fibrillation: Secondary | ICD-10-CM | POA: Diagnosis present

## 2017-06-01 DIAGNOSIS — I2510 Atherosclerotic heart disease of native coronary artery without angina pectoris: Secondary | ICD-10-CM | POA: Diagnosis not present

## 2017-06-01 DIAGNOSIS — R0902 Hypoxemia: Secondary | ICD-10-CM | POA: Diagnosis present

## 2017-06-01 DIAGNOSIS — Z95810 Presence of automatic (implantable) cardiac defibrillator: Secondary | ICD-10-CM

## 2017-06-01 DIAGNOSIS — R0602 Shortness of breath: Secondary | ICD-10-CM | POA: Diagnosis not present

## 2017-06-01 DIAGNOSIS — E785 Hyperlipidemia, unspecified: Secondary | ICD-10-CM | POA: Diagnosis not present

## 2017-06-01 DIAGNOSIS — Z6824 Body mass index (BMI) 24.0-24.9, adult: Secondary | ICD-10-CM

## 2017-06-01 DIAGNOSIS — I252 Old myocardial infarction: Secondary | ICD-10-CM | POA: Diagnosis not present

## 2017-06-01 DIAGNOSIS — E669 Obesity, unspecified: Secondary | ICD-10-CM | POA: Diagnosis present

## 2017-06-01 DIAGNOSIS — I255 Ischemic cardiomyopathy: Secondary | ICD-10-CM | POA: Diagnosis not present

## 2017-06-01 DIAGNOSIS — I110 Hypertensive heart disease with heart failure: Secondary | ICD-10-CM | POA: Diagnosis not present

## 2017-06-01 DIAGNOSIS — E7800 Pure hypercholesterolemia, unspecified: Secondary | ICD-10-CM | POA: Diagnosis not present

## 2017-06-01 DIAGNOSIS — I248 Other forms of acute ischemic heart disease: Secondary | ICD-10-CM | POA: Diagnosis not present

## 2017-06-01 DIAGNOSIS — I2489 Other forms of acute ischemic heart disease: Secondary | ICD-10-CM | POA: Diagnosis present

## 2017-06-01 DIAGNOSIS — G4733 Obstructive sleep apnea (adult) (pediatric): Secondary | ICD-10-CM | POA: Diagnosis present

## 2017-06-01 DIAGNOSIS — Z951 Presence of aortocoronary bypass graft: Secondary | ICD-10-CM

## 2017-06-01 DIAGNOSIS — I340 Nonrheumatic mitral (valve) insufficiency: Secondary | ICD-10-CM | POA: Diagnosis present

## 2017-06-01 DIAGNOSIS — I10 Essential (primary) hypertension: Secondary | ICD-10-CM | POA: Diagnosis present

## 2017-06-01 DIAGNOSIS — N179 Acute kidney failure, unspecified: Secondary | ICD-10-CM | POA: Diagnosis not present

## 2017-06-01 DIAGNOSIS — I4811 Longstanding persistent atrial fibrillation: Secondary | ICD-10-CM | POA: Diagnosis present

## 2017-06-01 LAB — CBC
HEMATOCRIT: 45.7 % (ref 39.0–52.0)
Hemoglobin: 14.7 g/dL (ref 13.0–17.0)
MCH: 32.8 pg (ref 26.0–34.0)
MCHC: 32.2 g/dL (ref 30.0–36.0)
MCV: 102 fL — AB (ref 78.0–100.0)
PLATELETS: 193 10*3/uL (ref 150–400)
RBC: 4.48 MIL/uL (ref 4.22–5.81)
RDW: 14.7 % (ref 11.5–15.5)
WBC: 9.1 10*3/uL (ref 4.0–10.5)

## 2017-06-01 LAB — BASIC METABOLIC PANEL
Anion gap: 12 (ref 5–15)
BUN: 8 mg/dL (ref 6–20)
CHLORIDE: 108 mmol/L (ref 101–111)
CO2: 21 mmol/L — AB (ref 22–32)
CREATININE: 1.02 mg/dL (ref 0.61–1.24)
Calcium: 9.3 mg/dL (ref 8.9–10.3)
GFR calc Af Amer: >60 mL/min (ref >60)
GFR calc non Af Amer: >60 mL/min (ref >60)
Glucose, Bld: 129 mg/dL — ABNORMAL HIGH (ref 65–99)
POTASSIUM: 4.4 mmol/L (ref 3.5–5.1)
Sodium: 141 mmol/L (ref 135–145)

## 2017-06-01 LAB — I-STAT TROPONIN, ED: Troponin i, poc: 0.19 ng/mL (ref 0.00–0.08)

## 2017-06-01 LAB — BRAIN NATRIURETIC PEPTIDE: B NATRIURETIC PEPTIDE 5: 3008.1 pg/mL — AB (ref 0.0–100.0)

## 2017-06-01 MED ORDER — RIVAROXABAN 20 MG PO TABS
20.0000 mg | ORAL_TABLET | Freq: Every day | ORAL | Status: DC
  Administered 2017-06-02: 20 mg via ORAL
  Filled 2017-06-01 (×2): qty 1

## 2017-06-01 MED ORDER — NITROGLYCERIN 2 % TD OINT
1.0000 [in_us] | TOPICAL_OINTMENT | Freq: Once | TRANSDERMAL | Status: AC
  Administered 2017-06-01: 1 [in_us] via TOPICAL
  Filled 2017-06-01: qty 1

## 2017-06-01 MED ORDER — FUROSEMIDE 10 MG/ML IJ SOLN
40.0000 mg | Freq: Once | INTRAMUSCULAR | Status: AC
  Administered 2017-06-01: 40 mg via INTRAVENOUS
  Filled 2017-06-01: qty 4

## 2017-06-01 MED ORDER — ALBUTEROL SULFATE (2.5 MG/3ML) 0.083% IN NEBU: 2.5000 mg | INHALATION_SOLUTION | Freq: Four times a day (QID) | RESPIRATORY_TRACT | Status: DC | PRN

## 2017-06-01 MED ORDER — ASPIRIN 81 MG PO CHEW
324.0000 mg | CHEWABLE_TABLET | Freq: Once | ORAL | Status: AC
  Administered 2017-06-01: 324 mg via ORAL
  Filled 2017-06-01: qty 4

## 2017-06-01 MED ORDER — SACUBITRIL-VALSARTAN 24-26 MG PO TABS
1.0000 | ORAL_TABLET | Freq: Two times a day (BID) | ORAL | Status: DC
Start: 2017-06-01 — End: 2017-06-03
  Administered 2017-06-01 – 2017-06-02 (×3): 1 via ORAL
  Filled 2017-06-01 (×4): qty 1

## 2017-06-01 MED ORDER — SODIUM CHLORIDE 0.9% FLUSH: 3.0000 mL | INTRAVENOUS | Status: DC | PRN

## 2017-06-01 MED ORDER — BUSPIRONE HCL 10 MG PO TABS
10.0000 mg | ORAL_TABLET | Freq: Two times a day (BID) | ORAL | Status: DC
  Administered 2017-06-01 – 2017-06-03 (×4): 10 mg via ORAL
  Filled 2017-06-01 (×4): qty 1

## 2017-06-01 MED ORDER — ATORVASTATIN CALCIUM 20 MG PO TABS
20.0000 mg | ORAL_TABLET | Freq: Every day | ORAL | Status: DC
  Administered 2017-06-02: 20 mg via ORAL
  Filled 2017-06-01 (×2): qty 1

## 2017-06-01 MED ORDER — ALBUTEROL SULFATE HFA 108 (90 BASE) MCG/ACT IN AERS
2.0000 | INHALATION_SPRAY | Freq: Four times a day (QID) | RESPIRATORY_TRACT | Status: DC | PRN
  Filled 2017-06-01: qty 6.7

## 2017-06-01 MED ORDER — CARVEDILOL 12.5 MG PO TABS
12.5000 mg | ORAL_TABLET | Freq: Two times a day (BID) | ORAL | Status: DC
  Administered 2017-06-01 – 2017-06-03 (×4): 12.5 mg via ORAL
  Filled 2017-06-01 (×4): qty 1

## 2017-06-01 MED ORDER — SODIUM CHLORIDE 0.9% FLUSH
3.0000 mL | Freq: Two times a day (BID) | INTRAVENOUS | Status: DC
  Administered 2017-06-01 – 2017-06-03 (×4): 3 mL via INTRAVENOUS

## 2017-06-01 MED ORDER — FUROSEMIDE 10 MG/ML IJ SOLN
80.0000 mg | Freq: Two times a day (BID) | INTRAMUSCULAR | Status: DC
  Administered 2017-06-01 – 2017-06-02 (×2): 80 mg via INTRAVENOUS
  Filled 2017-06-01 (×2): qty 8

## 2017-06-01 MED ORDER — SPIRONOLACTONE 12.5 MG HALF TABLET
12.5000 mg | ORAL_TABLET | Freq: Every day | ORAL | Status: DC
  Administered 2017-06-02: 12.5 mg via ORAL
  Filled 2017-06-01 (×2): qty 1

## 2017-06-01 MED ORDER — SODIUM CHLORIDE 0.9 % IV SOLN: 250.0000 mL | INTRAVENOUS | Status: DC | PRN

## 2017-06-01 MED ORDER — CLONAZEPAM 0.5 MG PO TABS
0.5000 mg | ORAL_TABLET | Freq: Three times a day (TID) | ORAL | Status: DC | PRN
  Administered 2017-06-01 – 2017-06-02 (×4): 0.5 mg via ORAL
  Filled 2017-06-01 (×4): qty 1

## 2017-06-01 MED ORDER — ONDANSETRON HCL 4 MG/2ML IJ SOLN: 4.0000 mg | Freq: Four times a day (QID) | INTRAMUSCULAR | Status: DC | PRN

## 2017-06-01 MED ORDER — ACETAMINOPHEN 325 MG PO TABS: 650.0000 mg | ORAL_TABLET | ORAL | Status: DC | PRN

## 2017-06-01 NOTE — ED Notes (Signed)
Gave report to

## 2017-06-01 NOTE — ED Triage Notes (Signed)
Pt reports sob x2-3 days, pt also reports swelling and soreness to bilateral legs. Denies cp, resp e/u, nad.

## 2017-06-01 NOTE — Telephone Encounter (Signed)
Patient's wife reports the patient has had difficulty breathing and swelling since yesterday. His feet are swollen into his ankles and his stomach is bloated. He has gained 5 pounds in 2 days. His breathing was intermittently difficult on exertion yesterday, but today it is worse and constant. He took 2 bottles out to the recycle bin much this morning and cannot recover. Ms. Ravenscraft said he is sitting in his chair having to lean completely forward to breathe at all.  Confirmed he is taking medications as directed. She said he limits his salt and tries to elevate his legs. Instructed her to take patient to the ER for evaluation of SOB and fluid overload.  She was grateful for call and agrees with treatment plan.

## 2017-06-01 NOTE — H&P (Signed)
Cardiology Admission History and Physical:   Patient ID: Larry Rangel; MRN: [id number]; DOB: 08-25-1950   Admission date: 06/01/2017  Primary Care Provider: Mosie Lukes, MD Primary Cardiologist: Virl Axe, MD  Primary Electrophysiologist:    Chief Complaint:  Shortness of breath  Patient Profile:   Larry Rangel is a 67 y.o. male with a history of chronic systolic and diastolic heart failure, ICD in place, DM, paroxysmal Aflutter s/p ablation 2014 and recurrence in 2015, HTN, HLD, MI 2009 s/p CABG x 5, last cath 2015 with 3/5 grafts patent.   History of Present Illness:   Larry Rangel has a complex cardiac history. He was recently admitted 01/2017 for Afib. He was diuresed for CHF and rate-controlled and discharged with plans for DCCV after 3 weeks of anticoagulation. He recenlty saw Dr. [name] in clinic on 05/14/17 and had declined DCCV because he was feeling well. Plan for long-term anticoagulation and rate control.  He presents back to [facility] with one week of progressively worsening shortness of breath and lower extremity edema. He states he had a 5 lb weight gain that he didn't know about until today. He reports DOE with minimal activity that was worse over the past two days. He does not report an increase in heart rate or presence of palpitations over the past week that could have contributed to this exacerbation.   On arrival, BNP is elevated to 3000 with a normal creatinine and CXR without interstitial edema, small bilateral pleural effusions. Troponin is 0.19.   He states he weighs daily (wife disagrees) but does not keep a written log. He states he was unaware that he should take extra lasix for weight gain.   Past Medical History:  Diagnosis Date  . Abnormal liver function 08/23/2010  . AICD (automatic cardioverter/defibrillator) present   . Anxiety   . Arthritis    "head to toe" (01/29/2017)  . CAD (coronary artery disease)    a. s/p CABG 2009. b. Cath 07/2013: 3/5  patent grafts (appear to have chronic occ grafts), mod diag/L PL branch, did not appear flow limiting, elevated filling pressures.  . CHF (congestive heart failure) ([place]) 07/30/2013  . Chicken pox as a child  . Chronic combined systolic and diastolic CHF (congestive heart failure) ([place])    a. Echo 6/14: Mild LVH, EF 30-35%, diffuse HK, MAC, mild BAE. b. Drop in EF to 20% by echo 07/2013.  Marland Kitchen Hyperlipidemia   . Hypertension   . LBBB (left bundle branch block)   . Low back pain   . Measles as a child  . Mumps as a child  . Myocardial infarction ([place]) 2009  . Paroxysmal atrial flutter ([place]) 07-2012   a. s/p ablation by Dr [name] 08-06-2012. b. Recurrence 07/2013.   Marland Kitchen Preventative health care 12/10/2014  . Tobacco user   . Type II diabetes mellitus ([place])     Past Surgical History:  Procedure Laterality Date  . ATRIAL FLUTTER ABLATION N/A 08/06/2012   Procedure: ATRIAL FLUTTER ABLATION;  Surgeon: Deboraha Sprang, MD;  Location: [facility] CATH LAB;  Service: Cardiovascular;  Laterality: N/A;  . CARDIAC CATHETERIZATION  2009  . CARDIOVERSION N/A 07/15/2012   Procedure: CARDIOVERSION;  Surgeon: Thayer Headings, MD;  Location: [facility] ENDOSCOPY;  Service: Cardiovascular;  Laterality: N/A;  . COLONOSCOPY    . CORONARY ARTERY BYPASS GRAFT  2009   x 5  . IMPLANTABLE CARDIOVERTER DEFIBRILLATOR IMPLANT N/A 11/10/2013   Procedure: SUB Q IMPLANTABLE CARDIOVERTER DEFIBRILLATOR IMPLANT;  Surgeon:  Deboraha Sprang, MD;  Location: [facility] CATH LAB;  Service: Cardiovascular;  Laterality: N/A;  . LEFT HEART CATHETERIZATION WITH CORONARY ANGIOGRAM N/A 08/01/2013   Procedure: LEFT HEART CATHETERIZATION WITH CORONARY ANGIOGRAM;  Surgeon: Burnell Blanks, MD;  Location: [facility] CATH LAB;  Service: Cardiovascular;  Laterality: N/A;  . POCKET REVISION N/A 12/16/2013   Procedure: POCKET REVISION;  Surgeon: Evans Lance, MD;  Location: [facility] CATH LAB;  Service: Cardiovascular;  Laterality: N/A;  . SHOULDER SURGERY Left    "sewed up; sports  related"  . TEE WITHOUT CARDIOVERSION N/A 07/15/2012   Procedure: TRANSESOPHAGEAL ECHOCARDIOGRAM (TEE);  Surgeon: Thayer Headings, MD;  Location: [place];  Service: Cardiovascular;  Laterality: N/A;  . TONSILLECTOMY       Medications Prior to Admission: Prior to Admission medications   Medication Sig Start Date End Date Taking? Authorizing Provider  albuterol (PROVENTIL HFA;VENTOLIN HFA) 108 (90 BASE) MCG/ACT inhaler Inhale 2 puffs into the lungs every 6 (six) hours as needed for wheezing or shortness of breath. 12/05/14  Yes Mosie Lukes, MD  atorvastatin (LIPITOR) 20 MG tablet TAKE 1 TABLET (20 MG TOTAL) BY MOUTH DAILY. 01/07/17  Yes Mosie Lukes, MD  busPIRone (BUSPAR) 10 MG tablet TAKE 1 TABLET BY MOUTH TWICE A DAY 02/25/16  Yes Mosie Lukes, MD  carvedilol (COREG) 12.5 MG tablet TAKE 1 TABLET BY MOUTH TWICE DAILY WITH A MEAL 12/08/16  Yes Deboraha Sprang, MD  clonazePAM (KLONOPIN) 0.5 MG tablet TAKE 1 TABLET BY MOUTH THREE TIMES A DAY AS NEEDED 04/30/17  Yes Mosie Lukes, MD  furosemide (LASIX) 40 MG tablet Take 40 mg by mouth daily. Take one table tby mouth daily. If weight increase > 3lbs overnight increase by one tablet. 04/10/17  Yes [provider]  glipiZIDE (GLUCOTROL) 5 MG tablet TAKE 1/2 TABLET (2.5 MG TOTAL) BY MOUTH ONCE A DAY BEFORE BREAKFAST Patient taking differently: 2.5MG BY MOUTH ONCE DAILY BEFORE BREAKFAST 09/16/16  Yes Mosie Lukes, MD  metFORMIN (GLUCOPHAGE) 500 MG tablet TAKE 1 TABLET BY MOUTH TWICE DAILY WITH A MEAL 01/09/17  Yes Mosie Lukes, MD  rivaroxaban (XARELTO) 20 MG TABS tablet TAKE 1 TABLET BY MOUTH EVERY DAY WITH SUPPER 09/26/16  Yes Deboraha Sprang, MD  sacubitril-valsartan (ENTRESTO) 24-26 MG Take 1 tablet by mouth 2 (two) times daily. 05/14/17  Yes Deboraha Sprang, MD  spironolactone (ALDACTONE) 25 MG tablet Take 0.5 tablets (12.5 mg total) by mouth daily. 02/03/17 03/05/17  Arrien, Jimmy Picket, MD     Allergies:   No Known  Allergies  Social History:   Social History   Socioeconomic History  . Marital status: Married    Spouse name: Not on file  . Number of children: Not on file  . Years of education: Not on file  . Highest education level: Not on file  Occupational History  . Not on file  Social Needs  . Financial resource strain: Not on file  . Food insecurity:    Worry: Not on file    Inability: Not on file  . Transportation needs:    Medical: Not on file    Non-medical: Not on file  Tobacco Use  . Smoking status: Former Smoker    Packs/day: 1.00    Years: 40.00    Pack years: 40.00    Types: Cigarettes    Last attempt to quit: 02/01/2011    Years since quitting: 6.3  . Smokeless tobacco: Never Used  Substance  and Sexual Activity  . Alcohol use: Yes    Alcohol/week: 7.2 oz    Types: 12 Glasses of wine per week  . Drug use: No  . Sexual activity: Yes    Birth control/protection: Coitus interruptus  Lifestyle  . Physical activity:    Days per week: Not on file    Minutes per session: Not on file  . Stress: Not on file  Relationships  . Social connections:    Talks on phone: Not on file    Gets together: Not on file    Attends religious service: Not on file    Active member of club or organization: Not on file    Attends meetings of clubs or organizations: Not on file    Relationship status: Not on file  . Intimate partner violence:    Fear of current or ex partner: Not on file    Emotionally abused: Not on file    Physically abused: Not on file    Forced sexual activity: Not on file  Other Topics Concern  . 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   He has been married four times previously, the first  For 64yr and has two children by the marriage which are grown, the second time for 11 yrs and has 2 by that marriage.   W[place] custody in  [place]   The third marriage was 4 1/2 yrs and most recent marriage was the past eight weeks and he is currently estranged from that person.    Family History:   The patient's family history includes Alzheimer's disease in his mother; Cancer in his father; Drug abuse in his daughter; Heart disease in his father; Heart failure (age of onset: 673 in his father; Hyperlipidemia in his father; Hypertension in his father; Leukemia in his brother. There is no history of Early death, Kidney disease, or Stroke.    ROS:  Please see the history of present illness.  All other ROS reviewed and negative.     Physical Exam/Data:   Vitals:   06/01/17 1315 06/01/17 1330 06/01/17 1345 06/01/17 1400  BP: (!) 161/106 (!) 177/122 (!) 167/102 (!) 166/128  Pulse: 98 98 (!) 109 94  Resp: (!) 24 (!) 27 (!) 29 (!) 28  Temp:      TempSrc:      SpO2: 95% 94% 95% 94%   No intake or output data in the 24 hours ending 06/01/17 1456 There were no vitals filed for this visit. There is no height or weight on file to calculate BMI.  General:  Well nourished, well developed, in no acute distress HEENT: normal Lymph: no adenopathy Neck: + JVD Endocrine:  No thryomegaly Vascular: No carotid bruits Cardiac:  Irregular rhythm, tachycardic rate, no murmur Lungs:  Occasional crackle in bases, clear in upper lobes  Abd: soft, nontender, no hepatomegaly  Ext: 1+ B LE  edema Musculoskeletal:  No deformities, BUE and BLE strength normal and equal Skin: warm and dry  Neuro:  CNs 2-12 intact, no focal abnormalities noted Psych:  Normal affect    EKG:  The ECG that was done was personally reviewed and demonstrates Afib   Relevant CV Studies:  Echo 01/30/17: Study Conclusions - Left ventricle: The cavity size was severely dilated. Systolic   function was severely reduced. The estimated ejection fraction  was in the range of 20% to 25%. Severe diffuse hypokinesis with   distinct regional wall motion  abnormalities. There is akinesis of   the inferior myocardium. Doppler parameters are consistent with   high ventricular filling pressure. - Mitral valve: Calcified annulus. There was mild to moderate   regurgitation. - Left atrium: The atrium was severely dilated. - Pulmonary arteries: PA peak pressure: 33 mm Hg (S). - Pericardium, extracardiac: There was a left pleural effusion.  Impressions: - Severely dilated LV dimensions with severe LV dysfunction with EF 20-25%, mild to moderate MR, left pleural effusion. Severe LAE.  The findings indicate significant septal-lateral left ventricular  wall dyssynchrony.   Cardiac cath 08/01/2013 Impression: 1. Severe triple vessel CAD s/p 5V CABG with 3/5 patent bypass grafts. The graft to the left sided PL is occluded and the graft to the diagonal is occluded (both appear to be chronic occlusions) 2. Moderate disease in diagonal and left sided posterolateral branch, does not appear to be flow limiting.  3. Elevated filling pressures  Recommendations: Continue medical management of CAD. He appears to be volume overloaded. This is most likely contributing to his dyspnea and hypoxia. Continue diuresis with IV Lasix.    Laboratory Data:  Chemistry Recent Labs  Lab 05/27/17 1147 06/01/17 1022  NA 142 141  K 4.4 4.4  CL 107* 108  CO2 21 21*  GLUCOSE 107* 129*  BUN 11 8  CREATININE 1.03 1.02  CALCIUM 9.3 9.3  GFRNONAA 74 >60  GFRAA 85 >60  ANIONGAP  --  12    No results for input(s): PROT, ALBUMIN, AST, ALT, ALKPHOS, BILITOT in the last 168 hours. Hematology Recent Labs  Lab 06/01/17 1022  WBC 9.1  RBC 4.48  HGB 14.7  HCT 45.7  MCV 102.0*  MCH 32.8  MCHC 32.2  RDW 14.7  PLT 193   Cardiac EnzymesNo results for input(s): TROPONINI in the last 168 hours.  Recent Labs  Lab 06/01/17 1041  TROPIPOC 0.19*    BNP Recent Labs  Lab 06/01/17 1023  BNP 3,008.1*    DDimer No results for input(s): DDIMER in the last 168  hours.  Radiology/Studies:  Dg Chest 2 View  Result Date: 06/01/2017 CLINICAL DATA:  Shortness of breath, bilateral leg swelling EXAM: CHEST - 2 VIEW COMPARISON:  02/04/2016 FINDINGS: Cardiomegaly. Postsurgical changes related to prior CABG. Subcutaneous ICD lead overlying the sternum. No frank interstitial edema. Small bilateral pleural effusions, left greater than right, similar to the prior. No pneumothorax. Median sternotomy. IMPRESSION: No frank interstitial edema.  Small bilateral pleural effusions. No interval change. Electronically Signed   By: Julian Hy M.D.   On: 06/01/2017 11:03    Assessment and Plan:   1. Acute on chronic systolic heart failure - LVEF by echo 20-25% with akinesis of the inferior myocardium; significant septal-lateral left ventricular wall dyssynchrony - BNP 3000 - CXR without edema, small pleural effusions - bilateral lower extremity edema - plan to admit to cardiology telemetry unit - start IV lasix 80 mg BID, follow daily BMP - pressures have been labile - will continue entresto and spiro for now   2. Elevated troponin - initial POC troponin mildly elevated at 0.19 - of note, troponin was also elevated during his 01/2017 admission for Afib (0.42-0.47)  - he denies chest pain  - I suspect this is likely demand ischemia in the setting of congestive heart failure exacerbation   3. Mild to moderate mitral regurgitation -  Will follow with  echo   4. Paroxysmal atrial fibrillation - pt remains in rate-controlled Afib - continue coreg 12.5 mg BID This patients CHA2DS2-VASc Score and unadjusted Ischemic Stroke Rate (% per year) is equal to 7.2 % stroke rate/year from a score of 5 (age, MI, HTN, DM, CHF) - continue xarelto for anticoagulation   5. HLD - continue statin - 01/30/2017: Cholesterol 89; HDL 32; LDL Cholesterol 40; Triglycerides 86; VLDL 17   Severity of Illness: The appropriate patient status for this patient is INPATIENT.  Inpatient status is judged to be reasonable and necessary in order to provide the required intensity of service to ensure the patient's safety. The patient's presenting symptoms, physical exam findings, and initial radiographic and laboratory data in the context of their chronic comorbidities is felt to place them at high risk for further clinical deterioration. Furthermore, it is not anticipated that the patient will be medically stable for discharge from the hospital within 2 midnights of admission. The following factors support the patient status of inpatient.   " The patient's presenting symptoms include DOE, SOB . " The worrisome physical exam findings include edema. " The initial radiographic and laboratory data are worrisome because of elevated BNP. " The chronic co-morbidities include CHF.   * I certify that at the point of admission it is my clinical judgment that the patient will require inpatient hospital care spanning beyond 2 midnights from the point of admission due to high intensity of service, high risk for further deterioration and high frequency of surveillance required.*    For questions or updates, please contact [place] Please consult [email or web address] for contact info under Cardiology/STEMI.    Signed, Tami Lin [place], [place]  06/01/2017 2:56 PM

## 2017-06-01 NOTE — ED Provider Notes (Addendum)
[facility] EMERGENCY DEPARTMENT Provider Note   CSN: [id number] Arrival date & time: 06/01/17  1007     History   Chief Complaint Chief Complaint  Patient presents with  . Shortness of Breath    HPI Larry Rangel is a 67 y.o. male.  HPI   67 year old male with past medical history of CAD status post CABG, ischemic cardia myopathy with EF 30%, here with shortness of breath.  The patient and his wife state that over the last several days, the patient has had approximately 5 pound weight gain.  He is become increasingly short of breath, initially with only heavy exertion but now with only minimal exertion.  He said difficulty even walking around the house due to difficulty breathing.  He endorses extreme shortness of breath and a sensation that he is smothering and cannot get enough air anytime he tries to move.  He also has orthopnea and increased leg swelling.  Denies any overt chest pain, lightheadedness, or dizziness.  No fevers.  Has had mildly increased nonproductive cough.  He has been taking his medications as prescribed.  Denies any increased salt intake or dietary indiscretion.  Past Medical History:  Diagnosis Date  . Abnormal liver function 08/23/2010  . AICD (automatic cardioverter/defibrillator) present   . Anxiety   . Arthritis    "head to toe" (01/29/2017)  . CAD (coronary artery disease)    a. s/p CABG 2009. b. Cath 07/2013: 3/5 patent grafts (appear to have chronic occ grafts), mod diag/L PL branch, did not appear flow limiting, elevated filling pressures.  . CHF (congestive heart failure) ([place]) 07/30/2013  . Chicken pox as a child  . Chronic combined systolic and diastolic CHF (congestive heart failure) ([place])    a. Echo 6/14: Mild LVH, EF 30-35%, diffuse HK, MAC, mild BAE. b. Drop in EF to 20% by echo 07/2013.  Marland Kitchen Hyperlipidemia   . Hypertension   . LBBB (left bundle branch block)   . Low back pain   . Measles as a child  . Mumps as a child    . Myocardial infarction ([place]) 2009  . Paroxysmal atrial flutter ([place]) 07-2012   a. s/p ablation by Dr [name] 08-06-2012. b. Recurrence 07/2013.   Marland Kitchen Preventative health care 12/10/2014  . Tobacco user   . Type II diabetes mellitus [facility])     Patient Active Problem List   Diagnosis Date Noted  . Memory loss 05/22/2017  . Elevated troponin I level   . Acute on chronic combined systolic (congestive) and diastolic (congestive) heart failure ([place]) 01/29/2017  . Pulmonary emphysema ([place]) 04/17/2015  . Preventative health care 12/10/2014  . Hematoma of implantable cardioverter-defibrillator (ICD) pocket 12/16/2013  . Ischemic cardiomyopathy 11/10/2013  . LBBB (left bundle branch block) 08/15/2013  . CAD (coronary artery disease)   . Paroxysmal atrial fibrillation ([place]) 08/02/2013  . Demand ischemia ([place]) 08/02/2013  . CHF (congestive heart failure) ([place]) 07/30/2013  . Medicare welcome exam 06/12/2013  . Microalbuminuria 11/13/2012  . Anxiety state 11/13/2012  . OSA (obstructive sleep apnea) 07/22/2012  . Diabetes mellitus type 2 in obese ([place]) 03/17/2011  . Abnormal liver function 08/23/2010  . Essential hypertension 10/04/2009  . Hyperlipidemia 09/10/2009    Past Surgical History:  Procedure Laterality Date  . ATRIAL FLUTTER ABLATION N/A 08/06/2012   Procedure: ATRIAL FLUTTER ABLATION;  Surgeon: Deboraha Sprang, MD;  Location[facility] C[facility] LAB;  Service: Cardiovascular;  Laterality: N/A;  . CARDIAC CATHETERIZATION  2009  .  CARDIOVERSION N/A 07/15/2012   Procedure: CARDIOVERSION;  Surgeon: Thayer Headings, MD;  Location: [facility] ENDOSCOPY;  Service: Cardiovascular;  Laterality: N/A;  . COLONOSCOPY    . CORONARY ARTERY BYPASS GRAFT  2009   x 5  . IMPLANTABLE CARDIOVERTER DEFIBRILLATOR IMPLANT N/A 11/10/2013   Procedure: SUB Q IMPLANTABLE CARDIOVERTER DEFIBRILLATOR IMPLANT;  Surgeon: Deboraha Sprang, MD;  Location: [facility] CATH LAB;  Service: Cardiovascular;  Laterality: N/A;  . LEFT HEART CATHETERIZATION  WITH CORONARY ANGIOGRAM N/A 08/01/2013   Procedure: LEFT HEART CATHETERIZATION WITH CORONARY ANGIOGRAM;  Surgeon: Burnell Blanks, MD;  Location: [facility] CATH LAB;  Service: Cardiovascular;  Laterality: N/A;  . POCKET REVISION N/A 12/16/2013   Procedure: POCKET REVISION;  Surgeon: Evans Lance, MD;  Location: [facility] CATH LAB;  Service: Cardiovascular;  Laterality: N/A;  . SHOULDER SURGERY Left    "sewed up; sports related"  . TEE WITHOUT CARDIOVERSION N/A 07/15/2012   Procedure: TRANSESOPHAGEAL ECHOCARDIOGRAM (TEE);  Surgeon: Thayer Headings, MD;  Location: [facility];  Service: Cardiovascular;  Laterality: N/A;  . TONSILLECTOMY          Home Medications    Prior to Admission medications   Medication Sig Start Date End Date Taking? Authorizing Provider  albuterol (PROVENTIL HFA;VENTOLIN HFA) 108 (90 BASE) MCG/ACT inhaler Inhale 2 puffs into the lungs every 6 (six) hours as needed for wheezing or shortness of breath. 12/05/14   Mosie Lukes, MD  atorvastatin (LIPITOR) 20 MG tablet TAKE 1 TABLET (20 MG TOTAL) BY MOUTH DAILY. 01/07/17   Mosie Lukes, MD  busPIRone (BUSPAR) 10 MG tablet TAKE 1 TABLET BY MOUTH TWICE A DAY 02/25/16   Mosie Lukes, MD  carvedilol (COREG) 12.5 MG tablet TAKE 1 TABLET BY MOUTH TWICE DAILY WITH A MEAL 12/08/16   Deboraha Sprang, MD  clonazePAM (KLONOPIN) 0.5 MG tablet TAKE 1 TABLET BY MOUTH THREE TIMES A DAY AS NEEDED 04/30/17   Mosie Lukes, MD  glipiZIDE (GLUCOTROL) 5 MG tablet TAKE 1/2 TABLET (2.5 MG TOTAL) BY MOUTH ONCE A DAY BEFORE BREAKFAST Patient taking differently: 2.5MG BY MOUTH ONCE DAILY BEFORE BREAKFAST 09/16/16   Mosie Lukes, MD  metFORMIN (GLUCOPHAGE) 500 MG tablet TAKE 1 TABLET BY MOUTH TWICE DAILY WITH A MEAL 01/09/17   Mosie Lukes, MD  rivaroxaban (XARELTO) 20 MG TABS tablet TAKE 1 TABLET BY MOUTH EVERY DAY WITH SUPPER 09/26/16   Deboraha Sprang, MD  sacubitril-valsartan (ENTRESTO) 24-26 MG Take 1 tablet by mouth 2 (two) times daily.  05/14/17   Deboraha Sprang, MD  spironolactone (ALDACTONE) 25 MG tablet Take 0.5 tablets (12.5 mg total) by mouth daily. 02/03/17 03/05/17  Arrien, Jimmy Picket, MD    Family History Family History  Problem Relation Age of Onset  . Alzheimer's disease Mother   . Heart failure Father 91  . Hypertension Father   . Hyperlipidemia Father   . Cancer Father        lung- took half of a young- smoker  . Heart disease Father        MI at 83  . Leukemia Brother   . Drug abuse Daughter        heroine  . Early death Neg Hx   . Kidney disease Neg Hx   . Stroke Neg Hx     Social History Social History   Tobacco Use  . Smoking status: Former Smoker    Packs/day: 1.00    Years: 40.00    Pack  years: 40.00    Types: Cigarettes    Last attempt to quit: 02/01/2011    Years since quitting: 6.3  . Smokeless tobacco: Never Used  Substance Use Topics  . Alcohol use: Yes    Alcohol/week: 7.2 oz    Types: 12 Glasses of wine per week  . Drug use: No     Allergies   Patient has no known allergies.   Review of Systems Review of Systems  Constitutional: Positive for fatigue. Negative for chills and fever.  HENT: Negative for congestion and rhinorrhea.   Eyes: Negative for visual disturbance.  Respiratory: Positive for cough and shortness of breath. Negative for wheezing.   Cardiovascular: Positive for leg swelling. Negative for chest pain.  Gastrointestinal: Negative for abdominal pain, diarrhea, nausea and vomiting.  Genitourinary: Negative for dysuria and flank pain.  Musculoskeletal: Negative for neck pain and neck stiffness.  Skin: Negative for rash and wound.  Allergic/Immunologic: Negative for immunocompromised state.  Neurological: Positive for weakness. Negative for syncope and headaches.  All other systems reviewed and are negative.    Physical Exam Updated Vital Signs BP (!) 148/102   Pulse (!) 110   Temp 97.8 F (36.6 C) (Oral)   Resp (!) 26   SpO2 95%   Physical  Exam  Constitutional: He is oriented to person, place, and time. He appears well-developed and well-nourished. No distress.  HENT:  Head: Normocephalic and atraumatic.  Eyes: Conjunctivae are normal.  Neck: Neck supple.  Cardiovascular: Normal rate, regular rhythm and normal heart sounds. Exam reveals no friction rub.  No murmur heard. Pulmonary/Chest: Effort normal. No respiratory distress. He has no wheezes. He has rales in the right middle field, the right lower field, the left middle field and the left lower field.  Abdominal: Soft. He exhibits distension. There is no tenderness. There is no guarding.  Musculoskeletal:       Right lower leg: He exhibits edema.       Left lower leg: He exhibits edema.  Neurological: He is alert and oriented to person, place, and time. He exhibits normal muscle tone.  Skin: Skin is warm. Capillary refill takes less than 2 seconds.  Psychiatric: He has a normal mood and affect.  Nursing note and vitals reviewed.    ED Treatments / Results  Labs (all labs ordered are listed, but only abnormal results are displayed) Labs Reviewed  BASIC METABOLIC PANEL - Abnormal; Notable for the following components:      Result Value   CO2 21 (*)    Glucose, Bld 129 (*)    All other components within normal limits  CBC - Abnormal; Notable for the following components:   MCV 102.0 (*)    All other components within normal limits  BRAIN NATRIURETIC PEPTIDE - Abnormal; Notable for the following components:   B Natriuretic Peptide 3,008.1 (*)    All other components within normal limits  I-STAT TROPONIN, ED - Abnormal; Notable for the following components:   Troponin i, poc 0.19 (*)    All other components within normal limits    EKG EKG Interpretation  Date/Time:  Monday Jun 01 2017 10:12:40 EDT Ventricular Rate:  101 PR Interval:    QRS Duration: 154 QT Interval:  384 QTC Calculation: 497 R Axis:   113 Text Interpretation:  Atrial fibrillation with  rapid ventricular response with premature ventricular or aberrantly conducted complexes Left bundle branch block SINCE LAST TRACING HEART RATE HAS INCREASED Since last EKG, rate has increased Confirmed  by Duffy Bruce [phone]) on 06/01/2017 1:14:14 PM   Radiology Dg Chest 2 View  Result Date: 06/01/2017 CLINICAL DATA:  Shortness of breath, bilateral leg swelling EXAM: CHEST - 2 VIEW COMPARISON:  02/04/2016 FINDINGS: Cardiomegaly. Postsurgical changes related to prior CABG. Subcutaneous ICD lead overlying the sternum. No frank interstitial edema. Small bilateral pleural effusions, left greater than right, similar to the prior. No pneumothorax. Median sternotomy. IMPRESSION: No frank interstitial edema.  Small bilateral pleural effusions. No interval change. Electronically Signed   By: Julian Hy M.D.   On: 06/01/2017 11:03    Procedures .Critical Care Performed by: Duffy Bruce, MD Authorized by: Duffy Bruce, MD   Critical care provider statement:    Critical care time (minutes):  35   Critical care time was exclusive of:  Separately billable procedures and treating other patients and teaching time   Critical care was necessary to treat or prevent imminent or life-threatening deterioration of the following conditions:  Circulatory failure, cardiac failure and respiratory failure   Critical care was time spent personally by me on the following activities:  Development of treatment plan with patient or surrogate, discussions with consultants, evaluation of patient's response to treatment, examination of patient, obtaining history from patient or surrogate, ordering and performing treatments and interventions, ordering and review of laboratory studies, ordering and review of radiographic studies, pulse oximetry, re-evaluation of patient's condition and review of old charts   I assumed direction of critical care for this patient from another provider in my specialty: no     (including  critical care time)  Medications Ordered in ED Medications  furosemide (LASIX) injection 40 mg (40 mg Intravenous Given 06/01/17 1223)  aspirin chewable tablet 324 mg (324 mg Oral Given 06/01/17 1223)  nitroGLYCERIN (NITROGLYN) 2 % ointment 1 inch (1 inch Topical Given 06/01/17 1223)     Initial Impression / Assessment and Plan / ED Course  I have reviewed the triage vital signs and the nursing notes.  Pertinent labs & imaging results that were available during my care of the patient were reviewed by me and considered in my medical decision making (see chart for details).    67 yo M here with acute on chronic SOB, weight gain, leg edema, and orthopnea. Lab work shows markedly elevated BNP, positive troponin, and CXR with bilateral effusions. History, exam is c/w acute on chronic CHF. Sees Dr. [name]. Will consult cardiology, admit.  Final Clinical Impressions(s) / ED Diagnoses   Final diagnoses:  None    ED Discharge Orders    None       Duffy Bruce, MD 06/01/17 1314    Duffy Bruce, MD 06/08/17 [phone]

## 2017-06-01 NOTE — Telephone Encounter (Signed)
-----   Message from Duke Salvia, MD sent at 06/01/2017  9:12 AM EDT ----- Please Inform Patient that labs are normal x  Thanks

## 2017-06-01 NOTE — Telephone Encounter (Signed)
lmtcb for lab results

## 2017-06-01 NOTE — Telephone Encounter (Signed)
New Message   Pt c/o Shortness Of Breath: STAT if SOB developed within the last 24 hours or pt is noticeably SOB on the phone  1. Are you currently SOB (can you hear that pt is SOB on the phone)? Yes, I spoke with the wife the wife says he still has some shortness of breath   2. How long have you been experiencing SOB? Just started this morning when he went to take out the trash he now has some relief but only if he leans forward  3. Are you SOB when sitting or when up moving around? Moving around  4. Are you currently experiencing any other symptoms? Swelling in feet, ankles and legs    Pt c/o swelling: STAT is pt has developed SOB within 24 hours  1) How much weight have you gained and in what time span? 5lb in about 3 days  2) If swelling, where is the swelling located? Feet, ankles, and legs  3) Are you currently taking a fluid pill? no  4) Are you currently SOB? yes  5) Do you have a log of your daily weights (if so, list)? no  6) Have you gained 3 pounds in a day or 5 pounds in a week? 5lbs in a week  7) Have you traveled recently? no

## 2017-06-02 ENCOUNTER — Other Ambulatory Visit: Payer: Self-pay

## 2017-06-02 ENCOUNTER — Inpatient Hospital Stay (HOSPITAL_COMMUNITY): Payer: Medicare HMO

## 2017-06-02 DIAGNOSIS — I130 Hypertensive heart and chronic kidney disease with heart failure and stage 1 through stage 4 chronic kidney disease, or unspecified chronic kidney disease: Secondary | ICD-10-CM

## 2017-06-02 DIAGNOSIS — I255 Ischemic cardiomyopathy: Secondary | ICD-10-CM

## 2017-06-02 DIAGNOSIS — R748 Abnormal levels of other serum enzymes: Secondary | ICD-10-CM

## 2017-06-02 LAB — BASIC METABOLIC PANEL
Anion gap: 10 (ref 5–15)
BUN: 14 mg/dL (ref 6–20)
CO2: 25 mmol/L (ref 22–32)
CREATININE: 1.25 mg/dL — AB (ref 0.61–1.24)
Calcium: 8.9 mg/dL (ref 8.9–10.3)
Chloride: 109 mmol/L (ref 101–111)
GFR calc Af Amer: >60 mL/min (ref >60)
GFR, EST NON AFRICAN AMERICAN: 57 mL/min — AB (ref >60)
GLUCOSE: 90 mg/dL (ref 65–99)
POTASSIUM: 3.7 mmol/L (ref 3.5–5.1)
SODIUM: 144 mmol/L (ref 135–145)

## 2017-06-02 LAB — ECHOCARDIOGRAM COMPLETE
Height: 74 in
WEIGHTICAEL: 3070.4 [oz_av]

## 2017-06-02 LAB — TROPONIN I: TROPONIN I: 0.27 ng/mL — AB (ref <0.03)

## 2017-06-02 MED ORDER — HYDRALAZINE HCL 25 MG PO TABS
25.0000 mg | ORAL_TABLET | Freq: Three times a day (TID) | ORAL | Status: AC
  Administered 2017-06-02 (×3): 25 mg via ORAL
  Filled 2017-06-02 (×3): qty 1

## 2017-06-02 MED ORDER — FUROSEMIDE 40 MG PO TABS
40.0000 mg | ORAL_TABLET | Freq: Every day | ORAL | Status: DC
  Filled 2017-06-02: qty 1

## 2017-06-02 NOTE — Progress Notes (Signed)
CRITICAL VALUE ALERT  Critical Value:  Troponin 0.27  Date & Time Notied:  06/02/17 @ 1033  Provider Notified: Barrett, PA  Orders Received/Actions taken: Paged PA with result, awaiting call back.

## 2017-06-02 NOTE — Progress Notes (Signed)
Progress Note  Patient Name: Larry Rangel Date of Encounter: 06/02/2017  Primary Cardiologist: Sherryl Manges, MD   Subjective   Feeling well.  Breathing improving but not back to baseline.   Inpatient Medications    Scheduled Meds: . atorvastatin  20 mg Oral q1800  . busPIRone  10 mg Oral BID  . carvedilol  12.5 mg Oral BID WC  . furosemide  80 mg Intravenous Q12H  . rivaroxaban  20 mg Oral Q supper  . sacubitril-valsartan  1 tablet Oral BID  . sodium chloride flush  3 mL Intravenous Q12H  . spironolactone  12.5 mg Oral Daily   Continuous Infusions: . sodium chloride     PRN Meds: sodium chloride, acetaminophen, albuterol, clonazePAM, ondansetron (ZOFRAN) IV, sodium chloride flush   Vital Signs    Vitals:   06/01/17 2044 06/02/17 0038 06/02/17 0353 06/02/17 0539  BP: (!) 140/97 (!) 139/96 (!) 141/90   Pulse: 70 85 64   Resp:      Temp: 97.6 F (36.4 C) 97.6 F (36.4 C) (!) 97.3 F (36.3 C)   TempSrc: Oral Oral Oral   SpO2: 98% 96% 96%   Weight:    191 lb 14.4 oz (87 kg)  Height:        Intake/Output Summary (Last 24 hours) at 06/02/2017 0756 Last data filed at 06/02/2017 0617 Gross per 24 hour  Intake 480 ml  Output 800 ml  Net -320 ml   Filed Weights   06/01/17 1852 06/02/17 0539  Weight: 193 lb 6.4 oz (87.7 kg) 191 lb 14.4 oz (87 kg)    Telemetry    Afib.  Rate <100 bpm. PVCs.  - Personally Reviewed  ECG    n/a - Personally Reviewed  Physical Exam   VS:  BP (!) 153/94   Pulse 86   Temp 98 F (36.7 C) (Oral)   Resp 18   Ht 6\' 2"  (1.88 m)   Wt 191 lb 14.4 oz (87 kg)   SpO2 96%   BMI 24.64 kg/m  , BMI Body mass index is 24.64 kg/m. GENERAL:  Well appearing HEENT: Pupils equal round and reactive, fundi not visualized, oral mucosa unremarkable NECK:  No jugular venous distention, waveform within normal limits, carotid upstroke brisk and symmetric, no bruits LUNGS:  Faint crackles at L base.  HEART:  Irregularly irregular.  PMI not  displaced or sustained,S1 and S2 within normal limits, no S3, no S4, no clicks, no rubs, no murmurs ABD:  Flat, positive bowel sounds normal in frequency in pitch, no bruits, no rebound, no guarding, no midline pulsatile mass, no hepatomegaly, no splenomegaly EXT:  2 plus pulses throughout, trace edema, no cyanosis no clubbing SKIN:  No rashes no nodules NEURO:  Cranial nerves II through XII grossly intact, motor grossly intact throughout [facility]:  Cognitively intact, oriented to person place and time   Labs    Chemistry Recent Labs  Lab 05/27/17 1147 06/01/17 1022  NA 142 141  K 4.4 4.4  CL 107* 108  CO2 21 21*  GLUCOSE 107* 129*  BUN 11 8  CREATININE 1.03 1.02  CALCIUM 9.3 9.3  GFRNONAA 74 >60  GFRAA 85 >60  ANIONGAP  --  12     Hematology Recent Labs  Lab 06/01/17 1022  WBC 9.1  RBC 4.48  HGB 14.7  HCT 45.7  MCV 102.0*  MCH 32.8  MCHC 32.2  RDW 14.7  PLT 193    Cardiac EnzymesNo results for  input(s): TROPONINI in the last 168 hours.  Recent Labs  Lab 06/01/17 1041  TROPIPOC 0.19*     BNP Recent Labs  Lab 06/01/17 1023  BNP 3,008.1*     DDimer No results for input(s): DDIMER in the last 168 hours.   Radiology    Dg Chest 2 View  Result Date: 06/01/2017 CLINICAL DATA:  Shortness of breath, bilateral leg swelling EXAM: CHEST - 2 VIEW COMPARISON:  02/04/2016 FINDINGS: Cardiomegaly. Postsurgical changes related to prior CABG. Subcutaneous ICD lead overlying the sternum. No frank interstitial edema. Small bilateral pleural effusions, left greater than right, similar to the prior. No pneumothorax. Median sternotomy. IMPRESSION: No frank interstitial edema.  Small bilateral pleural effusions. No interval change. Electronically Signed   By: Charline Bills M.D.   On: 06/01/2017 11:03    Cardiac Studies     Patient Profile     87M with chronic systolic and diastolic heart failure (LVEF 20-25%), longstanding persistent atrial fibrillation s/p ablation,  CAD s/p CABG, hypertension, hyperlipidemia, and diabetes here with acute on chronic systolic and diastolic dysfunction.  Assessment & Plan    # Acute on chronic systolic and diastolic heart failure: BNP 3000 on admission.  He was trying to drink lots of fluids prior to admission. We discussed 1.5 to 2L fluid restriction.  He was already limiting salt.  Continue carvedilol and Entresto.  We will increase Entresto at discharge given that his BP has been high.  Weight is down to 191 from 193 though he is only -300 mL.  Renal function slightly worse this AM.  Will hold on additional diuresis.  Hold spironolactone.  WIll given hydralazine 25mg  tid for today.  # Elevated troponin: # CAD s/p CABG: # Hyperlipidemia: Troponin elevated 2/2 demand ischemia.  He is not on aspirin given that he is on Xarelto. Continue atorvastatin.  # Hypertensive heart disease: BP has been mostly above goal.  Given the rise in creatinine we will give hydralazine as above and plan to increase Entresto when renal function improves.  # Persistent AFib:  Rate controlled on carvedilol  Continue Xarelto.   For questions or updates, please contact [facility] Please consult [email or web address] for contact info under Cardiology/STEMI.      Signed, Chilton Si, MD  06/02/2017, 7:56 AM

## 2017-06-02 NOTE — Progress Notes (Signed)
  Echocardiogram 2D Echocardiogram has been performed.  Larry Rangel 06/02/2017, 3:15 PM

## 2017-06-02 NOTE — Plan of Care (Signed)
  Problem: Education: Goal: Knowledge of General Education information will improve Outcome: Progressing Goal: Knowledge of General Education information will improve Outcome: Progressing   Problem: Health Behavior/Discharge Planning: Goal: Ability to manage health-related needs will improve Outcome: Progressing   Problem: Clinical Measurements: Goal: Ability to maintain clinical measurements within normal limits will improve Outcome: Progressing Goal: Will remain free from infection Outcome: Progressing Goal: Diagnostic test results will improve Outcome: Progressing Goal: Respiratory complications will improve Outcome: Progressing Goal: Cardiovascular complication will be avoided Outcome: Progressing   Problem: Activity: Goal: Risk for activity intolerance will decrease Outcome: Progressing   Problem: Nutrition: Goal: Adequate nutrition will be maintained Outcome: Progressing   Problem: Coping: Goal: Level of anxiety will decrease Outcome: Progressing   Problem: Elimination: Goal: Will not experience complications related to bowel motility Outcome: Progressing Goal: Will not experience complications related to urinary retention Outcome: Progressing   Problem: Pain Managment: Goal: General experience of comfort will improve Outcome: Progressing   Problem: Skin Integrity: Goal: Risk for impaired skin integrity will decrease Outcome: Progressing

## 2017-06-02 NOTE — Care Management Note (Signed)
Case Management Note  Patient Details  Name: Larry Rangel MRN: [id number] Date of Birth: [birth year]  Subjective/Objective:  CHF                 Action/Plan: Patient lives at home with spouse;Primary Care Provider: Bradd Canary, MD; has private insurance with [facility] with prescription drug coverage; CM will continue to follow for progression of care  Expected Discharge Date:     Possibly 06/04/2017             Expected Discharge Plan:  Home/Self Care  In-House Referral:   [facility]  Discharge planning Services  CM Consult  Status of Service:  In process, will continue to follow  Reola Mosher [phone] 06/02/2017, 2:53 PM

## 2017-06-03 DIAGNOSIS — I2510 Atherosclerotic heart disease of native coronary artery without angina pectoris: Secondary | ICD-10-CM

## 2017-06-03 DIAGNOSIS — E785 Hyperlipidemia, unspecified: Secondary | ICD-10-CM

## 2017-06-03 LAB — BASIC METABOLIC PANEL
ANION GAP: 9 (ref 5–15)
BUN: 22 mg/dL — ABNORMAL HIGH (ref 6–20)
CALCIUM: 8.9 mg/dL (ref 8.9–10.3)
CO2: 27 mmol/L (ref 22–32)
Chloride: 109 mmol/L (ref 101–111)
Creatinine, Ser: 1.26 mg/dL — ABNORMAL HIGH (ref 0.61–1.24)
GFR, EST NON AFRICAN AMERICAN: 57 mL/min — AB (ref >60)
GLUCOSE: 111 mg/dL — AB (ref 65–99)
POTASSIUM: 3.6 mmol/L (ref 3.5–5.1)
SODIUM: 145 mmol/L (ref 135–145)

## 2017-06-03 MED ORDER — FUROSEMIDE 80 MG PO TABS
80.0000 mg | ORAL_TABLET | Freq: Every day | ORAL | Status: DC
  Administered 2017-06-03: 80 mg via ORAL
  Filled 2017-06-03: qty 1

## 2017-06-03 MED ORDER — SPIRONOLACTONE 12.5 MG HALF TABLET: 12.5000 mg | ORAL_TABLET | Freq: Every day | ORAL | Status: DC

## 2017-06-03 MED ORDER — SACUBITRIL-VALSARTAN 49-51 MG PO TABS
1.0000 | ORAL_TABLET | Freq: Two times a day (BID) | ORAL | Status: DC
  Administered 2017-06-03: 1 via ORAL
  Filled 2017-06-03: qty 1

## 2017-06-03 MED ORDER — FUROSEMIDE 40 MG PO TABS: 40.0000 mg | ORAL_TABLET | Freq: Every day | ORAL | Status: DC

## 2017-06-03 MED ORDER — SPIRONOLACTONE 25 MG PO TABS: 12.5000 mg | ORAL_TABLET | Freq: Every day | ORAL | 3 refills | Status: DC

## 2017-06-03 MED ORDER — SACUBITRIL-VALSARTAN 49-51 MG PO TABS: 1.0000 | ORAL_TABLET | Freq: Two times a day (BID) | ORAL | 6 refills | Status: DC

## 2017-06-03 MED ORDER — FUROSEMIDE 40 MG PO TABS: 80.0000 mg | ORAL_TABLET | Freq: Every day | ORAL | 7 refills | Status: DC

## 2017-06-03 NOTE — Discharge Instructions (Signed)
SODIUM: Limit to 500 mg per meal  FLUID: No more than 2 quarts of all liquids daily.   This should be at least half water and half can be other things such as coffee, soup, etc.  WEIGHT: Weigh daily, first thing in the morning, wearing the same amount of clothing each day. Chart the weights so that they are easier to keep up with.

## 2017-06-03 NOTE — Progress Notes (Signed)
Progress Note  Patient Name: Larry Rangel Date of Encounter: 06/03/2017  Primary Cardiologist: Sherryl Manges, MD   Subjective   Patient feeling well this AM. States that his urine output is slowing. Dry weight reported by patient as 190 lbs with fluctuations of plus or minus 5 lbs. He denies SOB or CP. Feels that he is back to his baseline and would like to go home.   Inpatient Medications    Scheduled Meds: . atorvastatin  20 mg Oral q1800  . busPIRone  10 mg Oral BID  . carvedilol  12.5 mg Oral BID WC  . furosemide  40 mg Oral Daily  . rivaroxaban  20 mg Oral Q supper  . sacubitril-valsartan  1 tablet Oral BID  . sodium chloride flush  3 mL Intravenous Q12H   Continuous Infusions: . sodium chloride     PRN Meds: sodium chloride, acetaminophen, albuterol, clonazePAM, ondansetron (ZOFRAN) IV, sodium chloride flush   Vital Signs    Vitals:   06/02/17 0804 06/02/17 1757 06/02/17 2108 06/03/17 0546  BP: (!) 153/94 (!) 144/95 (!) 140/98 (!) 166/98  Pulse: 86 81 (!) 46 82  Resp: 18  20 20   Temp: 98 F (36.7 C)  98.2 F (36.8 C) 97.7 F (36.5 C)  TempSrc: Oral  Oral Oral  SpO2: 96%  98% 99%  Weight:    190 lb (86.2 kg)  Height:        Intake/Output Summary (Last 24 hours) at 06/03/2017 0738 Last data filed at 06/03/2017 0548 Gross per 24 hour  Intake 720 ml  Output 780 ml  Net -60 ml   Filed Weights   06/01/17 1852 06/02/17 0539 06/03/17 0546  Weight: 193 lb 6.4 oz (87.7 kg) 191 lb 14.4 oz (87 kg) 190 lb (86.2 kg)   Telemetry    Persistent atrial fibrillation with HR <110  - Personally Reviewed  ECG    No new EKG for review - Personally Reviewed  Physical Exam   VS:  BP (!) 166/98 (BP Location: Left Arm)   Pulse 82   Temp 97.7 F (36.5 C) (Oral)   Resp 20   Ht 6\' 2"  (1.88 m)   Wt 190 lb (86.2 kg)   SpO2 99%   BMI 24.39 kg/m  , BMI Body mass index is 24.39 kg/m.  General: Well nourished male in no acute distress HENT: No JVD noted Pulm:  Good air movement with bibasilar crackles  CV: Regular rate, irregularly irregular rhythm, distant hear sounds, no murmurs, no rubs  Abdomen: Active bowel sounds, soft, non-distended Extremities: No LE edema  Skin: Warm and dry   Labs    Chemistry Recent Labs  Lab 06/01/17 1022 06/02/17 0522 06/03/17 0540  NA 141 144 145  K 4.4 3.7 3.6  CL 108 109 109  CO2 21* 25 27  GLUCOSE 129* 90 111*  BUN 8 14 22*  CREATININE 1.02 1.25* 1.26*  CALCIUM 9.3 8.9 8.9  GFRNONAA >60 57* 57*  GFRAA >60 >60 >60  ANIONGAP 12 10 9     Hematology Recent Labs  Lab 06/01/17 1022  WBC 9.1  RBC 4.48  HGB 14.7  HCT 45.7  MCV 102.0*  MCH 32.8  MCHC 32.2  RDW 14.7  PLT 193   Cardiac Enzymes Recent Labs  Lab 06/02/17 0818  TROPONINI 0.27*    Recent Labs  Lab 06/01/17 1041  TROPIPOC 0.19*    BNP Recent Labs  Lab 06/01/17 1023  BNP 3,008.1*  DDimer No results for input(s): DDIMER in the last 168 hours.   Radiology    Dg Chest 2 View  Result Date: 06/01/2017 CLINICAL DATA:  Shortness of breath, bilateral leg swelling EXAM: CHEST - 2 VIEW COMPARISON:  02/04/2016 FINDINGS: Cardiomegaly. Postsurgical changes related to prior CABG. Subcutaneous ICD lead overlying the sternum. No frank interstitial edema. Small bilateral pleural effusions, left greater than right, similar to the prior. No pneumothorax. Median sternotomy. IMPRESSION: No frank interstitial edema.  Small bilateral pleural effusions. No interval change. Electronically Signed   By: Charline Bills M.D.   On: 06/01/2017 11:03   Cardiac Studies   Transthoracic Echocardiogram 06/02/2017  - Left ventricle: The cavity size was moderately dilated. Wall   thickness was normal. Systolic function was severely reduced. The   estimated ejection fraction was in the range of 25% to 30%.   Diffuse hypokinesis. The study is not technically sufficient to   allow evaluation of LV diastolic function. - Aortic valve: Trileaflet; mildly  thickened, mildly calcified   leaflets. - Mitral valve: There was trivial regurgitation. - Left atrium: The atrium was moderately dilated.  Patient Profile     35M with chronic systolic and diastolic heart failure (LVEF 20-25%), longstanding persistent atrial fibrillation s/p ablation, CAD s/p CABG, hypertension, hyperlipidemia, and diabetes here with acute on chronic systolic and diastolic dysfunction.  Assessment & Plan    Acute on chronic systolic and diastolic heart failure: - BNP initially elevated to 3000 on admission  - Net negative 1.58 L since admission and weight down 3 lbs  - Discussed lifestyle modification including fluid restriction  - K 3.6, HCO3 27, and creatinine 1.26 (up from a baseline of ~1.0) - Echocardiogram on 5/21 illustrating LVEF 25-30% with diffuse hypokinesis and moderately dilated left atrium - Continue Furosemide 40 mg PO QD, Carvedilol 12.5 mg BID, and Entresto 24-26 mg QD - Plan to increase Entresto once patient's renal function improves  - Plan to restart Spironolactone once renal function improves  - Patient has subcutaneous ICD in place.  Elevated troponin: CAD s/p CABG: Hyperlipidemia: - Troponin peaked at 0.47, likely from demand ischemia  - Continue Xarelto   Hypertensive heart disease: - BP remains above goal  - On Carvedilol 12.5 mg BID, Hydralazine 25 mg TID, and Entresto 24-26 mg QD - Holding Spironolactone with AKI but will restart and increase Entresto once renal function improved.   Persistent AFib:   - HR at goal of <110  - Continue Carvedilol and Xarelto   For questions or updates, please contact [facility] Please consult [email or web address] for contact info under Cardiology/STEMI.     Signed, Levora Dredge, MD  06/03/2017, 7:38 AM

## 2017-06-03 NOTE — Discharge Summary (Signed)
Discharge Summary    Patient ID: Larry Rangel,  MRN: [id number], DOB/AGE: Mar 03, 1950 67 y.o.  Admit date: 06/01/2017 Discharge date: 06/03/2017  Primary Care Provider: Danise Edge A Primary Cardiologist: Sherryl Manges, MD   Discharge Diagnoses    Active Problems:   Hyperlipidemia   Essential hypertension   Diabetes mellitus type 2 in obese (HCC)   OSA (obstructive sleep apnea)   Demand ischemia (HCC)   CAD (coronary artery disease)   Acute on chronic combined systolic and diastolic CHF (congestive heart failure) (HCC)   S/P CABG (coronary artery bypass graft)   Longstanding persistent atrial fibrillation (HCC)   Allergies No Known Allergies  Diagnostic Studies/Procedures    ECHO: 06/02/2017 - Left ventricle: The cavity size was moderately dilated. Wall   thickness was normal. Systolic function was severely reduced. The   estimated ejection fraction was in the range of 25% to 30%.   Diffuse hypokinesis. The study is not technically sufficient to   allow evaluation of LV diastolic function. - Aortic valve: Trileaflet; mildly thickened, mildly calcified   leaflets. - Mitral valve: There was trivial regurgitation. - Left atrium: The atrium was moderately dilated.  _____________   History of Present Illness     18M with chronic systolic and diastolic heart failure (LVEF 20-25%), longstanding persistent atrial fibrillation, atrial flutter s/p ablation, MI and CABG 2009, 3/5 grafts patent 2015, hypertension, hyperlipidemia, and diabetes was admitted 05/20 with acute on chronic systolic and diastolic dysfunction.   Hospital Course      Consultants: None   He was diuresed with IV Lasix with improvement in his respiratory status and a decrease in his weight.  Discharge weight 190 pounds.  BNP was 3000 on admission.  He had mild elevation in his cardiac enzymes, peak troponin 0.27 but this was felt secondary to heart failure and not ACS.  He was encouraged to be  compliant with a low-sodium diet and limiting his fluid intake to 2 L daily.  He also had missed doses of medications.  Medication and diet compliance is encouraged.  After diuresis, his blood pressure was elevated and his Entresto was increased.  His BUN and creatinine increased with diuresis, discharge labs are below.  Spironolactone is to be held for the next 3 days.  On 5/22, he was seen by Dr. Duke Salvia and all data were reviewed.  No further inpatient work-up is indicated and he is considered stable for discharge, to follow-up as an outpatient. _____________  Discharge Vitals Blood pressure (!) 155/97, pulse 76, temperature 97.7 F (36.5 C), temperature source Oral, resp. rate 20, height 6\' 2"  (1.88 m), weight 190 lb (86.2 kg), SpO2 99 %.  Filed Weights   06/01/17 1852 06/02/17 0539 06/03/17 0546  Weight: 193 lb 6.4 oz (87.7 kg) 191 lb 14.4 oz (87 kg) 190 lb (86.2 kg)    Labs & Radiologic Studies    CBC Recent Labs    06/01/17 1022  WBC 9.1  HGB 14.7  HCT 45.7  MCV 102.0*  PLT 193   Basic Metabolic Panel Recent Labs    54/49/20 0522 06/03/17 0540  NA 144 145  K 3.7 3.6  CL 109 109  CO2 25 27  GLUCOSE 90 111*  BUN 14 22*  CREATININE 1.25* 1.26*  CALCIUM 8.9 8.9   Cardiac Enzymes Troponin I  Date Value Ref Range Status  06/02/2017 0.27 (HH) <0.03 ng/mL Final    Comment:    CRITICAL RESULT CALLED TO, READ BACK  BY AND VERIFIED WITH: G.MADISON,RN 1031 06/02/17 CLARK,S Performed at [facility], [address] [place], [place] [address]     Recent Labs    06/02/17 0818  TROPONINI 0.27*    BNP    Component Value Date/Time   BNP 3,008.1 (H) 06/01/2017 1023   _____________  Dg Chest 2 View  Result Date: 06/01/2017 CLINICAL DATA:  Shortness of breath, bilateral leg swelling EXAM: CHEST - 2 VIEW COMPARISON:  02/04/2016 FINDINGS: Cardiomegaly. Postsurgical changes related to prior CABG. Subcutaneous ICD lead overlying the sternum. No frank interstitial  edema. Small bilateral pleural effusions, left greater than right, similar to the prior. No pneumothorax. Median sternotomy. IMPRESSION: No frank interstitial edema.  Small bilateral pleural effusions. No interval change. Electronically Signed   By: Charline Bills M.D.   On: 06/01/2017 11:03   Disposition   Pt is being discharged home today in good condition.  Follow-up Plans & Appointments    Follow-up Information    Duke Salvia, MD Follow up.   Specialty:  Cardiology Why:  The office will call.  Contact information: [address] [place] [address] [phone]          Discharge Instructions    Diet - low sodium heart healthy   Complete by:  As directed    Increase activity slowly   Complete by:  As directed       Discharge Medications   Allergies as of 06/03/2017   No Known Allergies     Medication List    STOP taking these medications   sacubitril-valsartan 24-26 MG Commonly known as:  ENTRESTO Replaced by:  sacubitril-valsartan 49-51 MG     TAKE these medications   albuterol 108 (90 Base) MCG/ACT inhaler Commonly known as:  PROVENTIL HFA;VENTOLIN HFA Inhale 2 puffs into the lungs every 6 (six) hours as needed for wheezing or shortness of breath.   atorvastatin 20 MG tablet Commonly known as:  LIPITOR TAKE 1 TABLET (20 MG TOTAL) BY MOUTH DAILY.   busPIRone 10 MG tablet Commonly known as:  BUSPAR TAKE 1 TABLET BY MOUTH TWICE A DAY   carvedilol 12.5 MG tablet Commonly known as:  COREG TAKE 1 TABLET BY MOUTH TWICE DAILY WITH A MEAL   clonazePAM 0.5 MG tablet Commonly known as:  KLONOPIN TAKE 1 TABLET BY MOUTH THREE TIMES A DAY AS NEEDED   furosemide 40 MG tablet Commonly known as:  LASIX Take 2 tablets (80 mg total) by mouth daily. Take 2 tabs daily for the next 2 days, then 1 tab daily. If weight up > 3lbs overnight>>2 tabs What changed:    how much to take  additional instructions Notes to patient:  FOR THE NEXT 2  DAYS, TAKE 2 TABS (total 80 mg)  then take 1 tab daily unless your weight goes up by more than 3 pounds in a day.  If that happens, take 2 tablets.   glipiZIDE 5 MG tablet Commonly known as:  GLUCOTROL TAKE 1/2 TABLET (2.5 MG TOTAL) BY MOUTH ONCE A DAY BEFORE BREAKFAST What changed:  See the new instructions.   metFORMIN 500 MG tablet Commonly known as:  GLUCOPHAGE TAKE 1 TABLET BY MOUTH TWICE DAILY WITH A MEAL   rivaroxaban 20 MG Tabs tablet Commonly known as:  XARELTO TAKE 1 TABLET BY MOUTH EVERY DAY WITH SUPPER   sacubitril-valsartan 49-51 MG Commonly known as:  ENTRESTO Take 1 tablet by mouth 2 (two) times daily. Replaces:  sacubitril-valsartan 24-26  MG   spironolactone 25 MG tablet Commonly known as:  ALDACTONE Take 0.5 tablets (12.5 mg total) by mouth daily. Start taking on:  06/07/2017 What changed:  These instructions start on 06/07/2017. If you are unsure what to do until then, ask your doctor or other care provider.         Outstanding Labs/Studies   None   Duration of Discharge Encounter   Greater than 30 minutes including physician time.  Bobbye Riggs Jawan Chavarria NP 06/03/2017, 11:38 AM

## 2017-06-04 ENCOUNTER — Telehealth: Payer: Self-pay | Admitting: *Deleted

## 2017-06-04 NOTE — Telephone Encounter (Signed)
Prior authorization done for ENTRESTO through covermymeds. 

## 2017-06-17 NOTE — Progress Notes (Signed)
Cardiology Office Note    Date:  06/22/2017   ID:  Larry Rangel, DOB 01-19-1950, MRN [id number]  PCP:  Mosie Lukes, MD  Cardiologist:  Dr. [name]   Chief Complaint: Hospital follow up   History of Present Illness:   Larry Rangel is a 67 y.o. male with chronic systolic and diastolic heart failure (LVEF 20-25%), ICM s/p ICD,  longstanding persistent atrial fibrillation, atrial flutter s/p ablation, MI and CABG 2009, 3/5 grafts patent 2015, hypertension, hyperlipidemia, and diabetes presented for hospital follow up.   Admitted 05/20 with acute on chronic systolic and diastolic dysfunction. BNP was 3000 on admission.  He had mild elevation in his cardiac enzymes, peak troponin 0.27 but this was felt secondary to heart failure and not ACS. He was diuresed with IV Lasix with improvement in his respiratory status and a decrease in his weight.  Discharge weight 190 pounds. His blood pressure was elevated and his Entresto was increased.  His BUN and creatinine increased with diuresis, Spironolactone is to be held for the next 3 days.  Here today for follow up. Lost 6lb since discharge. The patient denies nausea, vomiting, fever, chest pain, palpitations, shortness of breath, orthopnea, PND, dizziness, syncope, cough, congestion, abdominal pain, hematochezia, melena, lower extremity edema. Compliant with low sodium diet.   Past Medical History:  Diagnosis Date  . Abnormal liver function 08/23/2010  . AICD (automatic cardioverter/defibrillator) present   . Anxiety   . Arthritis    "head to toe" (01/29/2017)  . CAD (coronary artery disease)    a. s/p CABG 2009. b. Cath 07/2013: 3/5 patent grafts (appear to have chronic occ grafts), mod diag/L PL branch, did not appear flow limiting, elevated filling pressures.  . CHF (congestive heart failure) ([place]) 07/30/2013  . Chicken pox as a child  . Chronic combined systolic and diastolic CHF (congestive heart failure) ([place])    a. Echo 6/14: Mild  LVH, EF 30-35%, diffuse HK, MAC, mild BAE. b. Drop in EF to 20% by echo 07/2013.  Marland Kitchen Hyperlipidemia   . Hypertension   . LBBB (left bundle branch block)   . Low back pain   . Measles as a child  . Mumps as a child  . Myocardial infarction ([place]) 2009  . Paroxysmal atrial flutter ([place]) 07-2012   a. s/p ablation by Dr [name] 08-06-2012. b. Recurrence 07/2013.   Marland Kitchen Preventative health care 12/10/2014  . Tobacco user   . Type II diabetes mellitus ([place])     Past Surgical History:  Procedure Laterality Date  . ATRIAL FLUTTER ABLATION N/A 08/06/2012   Procedure: ATRIAL FLUTTER ABLATION;  Surgeon: Deboraha Sprang, MD;  Location: [facility] CATH LAB;  Service: Cardiovascular;  Laterality: N/A;  . CARDIAC CATHETERIZATION  2009  . CARDIOVERSION N/A 07/15/2012   Procedure: CARDIOVERSION;  Surgeon: Thayer Headings, MD;  Location: [facility] ENDOSCOPY;  Service: Cardiovascular;  Laterality: N/A;  . COLONOSCOPY    . CORONARY ARTERY BYPASS GRAFT  2009   x 5  . IMPLANTABLE CARDIOVERTER DEFIBRILLATOR IMPLANT N/A 11/10/2013   Procedure: SUB Q IMPLANTABLE CARDIOVERTER DEFIBRILLATOR IMPLANT;  Surgeon: Deboraha Sprang, MD;  Location[facility] CATH LAB;  Service: Cardiovascular;  Laterality: N/A;  . LEFT HEART CATHETERIZATION WITH CORONARY ANGIOGRAM N/A 08/01/2013   Procedure: LEFT HEART CATHETERIZATION WITH CORONARY ANGIOGRAM;  Surgeon: Burnell Blanks, MD;  Location: [facility] CATH LAB;  Service: Cardiovascular;  Laterality: N/A;  . POCKET REVISION N/A 12/16/2013   Procedure: POCKET REVISION;  Surgeon: Carleene Overlie  Peyton Najjar, MD;  Location: [facility] CATH LAB;  Service: Cardiovascular;  Laterality: N/A;  . SHOULDER SURGERY Left    "sewed up; sports related"  . TEE WITHOUT CARDIOVERSION N/A 07/15/2012   Procedure: TRANSESOPHAGEAL ECHOCARDIOGRAM (TEE);  Surgeon: Thayer Headings, MD;  Location: [facility] ENDOSCOPY;  Service: Cardiovascular;  Laterality: N/A;  . TONSILLECTOMY      Current Medications: Prior to Admission medications   Medication Sig Start Date  End Date Taking? Authorizing Provider  albuterol (PROVENTIL HFA;VENTOLIN HFA) 108 (90 BASE) MCG/ACT inhaler Inhale 2 puffs into the lungs every 6 (six) hours as needed for wheezing or shortness of breath. 12/05/14   Mosie Lukes, MD  atorvastatin (LIPITOR) 20 MG tablet TAKE 1 TABLET (20 MG TOTAL) BY MOUTH DAILY. 01/07/17   Mosie Lukes, MD  busPIRone (BUSPAR) 10 MG tablet TAKE 1 TABLET BY MOUTH TWICE A DAY 02/25/16   Mosie Lukes, MD  carvedilol (COREG) 12.5 MG tablet TAKE 1 TABLET BY MOUTH TWICE DAILY WITH A MEAL 12/08/16   Deboraha Sprang, MD  clonazePAM (KLONOPIN) 0.5 MG tablet TAKE 1 TABLET BY MOUTH THREE TIMES A DAY AS NEEDED 04/30/17   Mosie Lukes, MD  furosemide (LASIX) 40 MG tablet Take 2 tablets (80 mg total) by mouth daily. Take 2 tabs daily for the next 2 days, then 1 tab daily. If weight up > 3lbs overnight>>2 tabs 06/03/17   Barrett, Evelene Croon, PA-C  glipiZIDE (GLUCOTROL) 5 MG tablet TAKE 1/2 TABLET (2.5 MG TOTAL) BY MOUTH ONCE A DAY BEFORE BREAKFAST Patient taking differently: 2.5MG BY MOUTH ONCE DAILY BEFORE BREAKFAST 09/16/16   Mosie Lukes, MD  metFORMIN (GLUCOPHAGE) 500 MG tablet TAKE 1 TABLET BY MOUTH TWICE DAILY WITH A MEAL 01/09/17   Mosie Lukes, MD  rivaroxaban (XARELTO) 20 MG TABS tablet TAKE 1 TABLET BY MOUTH EVERY DAY WITH SUPPER 09/26/16   Deboraha Sprang, MD  sacubitril-valsartan (ENTRESTO) 49-51 MG Take 1 tablet by mouth 2 (two) times daily. 06/03/17   Barrett, Evelene Croon, PA-C  spironolactone (ALDACTONE) 25 MG tablet Take 0.5 tablets (12.5 mg total) by mouth daily. 06/07/17 07/07/17  Barrett, Evelene Croon, PA-C    Allergies:   Patient has no known allergies.   Social History   Socioeconomic History  . Marital status: Married    Spouse name: Not on file  . Number of children: Not on file  . Years of education: Not on file  . Highest education level: Not on file  Occupational History  . Not on file  Social Needs  . Financial resource strain: Not on file  .  Food insecurity:    Worry: Not on file    Inability: Not on file  . Transportation needs:    Medical: Not on file    Non-medical: Not on file  Tobacco Use  . Smoking status: Former Smoker    Packs/day: 1.00    Years: 40.00    Pack years: 40.00    Types: Cigarettes    Last attempt to quit: 02/01/2011    Years since quitting: 6.3  . Smokeless tobacco: Never Used  Substance and Sexual Activity  . Alcohol use: Yes    Alcohol/week: 7.2 oz    Types: 12 Glasses of wine per week  . Drug use: No  . Sexual activity: Yes    Birth control/protection: Coitus interruptus  Lifestyle  . Physical activity:    Days per week: Not on file    Minutes per session:  Not on file  . Stress: Not on file  Relationships  . Social connections:    Talks on phone: Not on file    Gets together: Not on file    Attends religious service: Not on file    Active member of club or organization: Not on file    Attends meetings of clubs or organizations: Not on file    Relationship status: Not on file  Other Topics Concern  . 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   He has been married four times previously, the first  For 33yr and has two children by the marriage which are grown, the second time for 11 yrs and has 2 by that marriage.   Wife has custody in [place]   The third marriage was 4 1/2 yrs and most recent marriage was the past eight weeks and he is currently estranged from that person.     Family History:  The patient's family history includes Alzheimer's disease in his mother; Cancer in his father; Drug abuse in his daughter; Heart disease in his father; Heart failure (age of onset: 656 in his father; Hyperlipidemia in his father; Hypertension in his father; Leukemia in his brother.  ROS:   Please see the history of present illness.    ROS All other systems  reviewed and are negative.   PHYSICAL EXAM:   VS:  BP 118/78   Pulse 71   Ht _0  (1.88 m)   Wt 184 lb (83.5 kg)   SpO2 98%   BMI 23.62 kg/m    GEN: Well nourished, well developed, in no acute distress  HEENT: normal  Neck: no JVD, carotid bruits, or masses Cardiac: RRR; no murmurs, rubs, or gallops,no edema  Respiratory:  clear to auscultation bilaterally, normal work of breathing GI: soft, nontender, nondistended, + BS MS: no deformity or atrophy  Skin: warm and dry, multiple scattered bruise Neuro:  Alert and Oriented x 3, Strength and sensation are intact Psych: euthymic mood, full affect  Wt Readings from Last 3 Encounters:  06/22/17 184 lb (83.5 kg)  06/03/17 190 lb (86.2 kg)  05/22/17 186 lb 9.6 oz (84.6 kg)      Studies/Labs Reviewed:   EKG:  EKG is ordered today.  The ekg ordered today demonstrates aflutter at variable rate of 71  Recent Labs: 01/28/2017: ALT 12; Pro B Natriuretic peptide (BNP) 1,187.0 01/29/2017: TSH 0.855 02/01/2017: Magnesium 1.9 06/01/2017: B Natriuretic Peptide 3,008.1; Hemoglobin 14.7; Platelets 193 06/03/2017: BUN 22; Creatinine, Ser 1.26; Potassium 3.6; Sodium 145   Lipid Panel    Component Value Date/Time   CHOL 89 01/30/2017 0554   TRIG 86 01/30/2017 0554   HDL 32 (L) 01/30/2017 0554   CHOLHDL 2.8 01/30/2017 0554   VLDL 17 01/30/2017 0554   LDLCALC 40 01/30/2017 0554   LDLDIRECT 62.0 12/05/2014 1203    Additional studies/ records that were reviewed today include:   ECHO: 06/02/2017 - Left ventricle: The cavity size was moderately dilated. Wall thickness was normal. Systolic function was severely reduced. The estimated ejection fraction was in the range of 25% to 30%. Diffuse hypokinesis. The study is not technically sufficient to allow evaluation of LV diastolic function. - Aortic valve: Trileaflet; mildly thickened, mildly calcified leaflets. - Mitral valve: There was trivial regurgitation. - Left atrium: The  atrium was moderately dilated.  Cath 07/2013 Hemodynamic Findings: Ao:  107/69             LV: 101/28/35 RA: 17        RV: 53/11/20 PA:  45/21/ (mean 34)       PCWP:  32 Fick Cardiac Output: 3.46 L/min Fick Cardiac Index: 1.51 L/min/m2 Central Aortic Saturation: 93% Pulmonary Artery Saturation: 49%  Angiographic Findings:  Left main: No obstructive disease.   Left Anterior Descending Artery: Large caliber vessel that courses to the apex. 50% proximal stenosis. 50% mid stenosis with competitive filling seen distally from graft. Mid and distal vessel fills from the patent IMA graft. The diagonal branch is patent, moderate in caliber with proximal 60% stenosis. The graft to the diagonal is occluded.   Circumflex Artery: Large caliber vessel with 50% mid stenosis. There is a large caliber obtuse marginal branch with 100% occlusion, fills from patent vein graft. The left posterolateral branch has a focal 60-70% stenosis. The vein graft that supplied the PL branch (sequential limb) is occluded.   Right Coronary Artery: Large caliber dominant vessel with 99% mid stenosis. The mid and distal vessel fills from the patent free RIMA graft.   Graft Anatomy:  SVG to OM1/sequential to left posterolateral branch is patent to the OM but the sequential limb to the PL is occluded SVG to Diagonal is occluded LIMA to LAD is patent Free RIMA to RCA is patent  Left Ventricular Angiogram: Deferred.   Impression: 1. Severe triple vessel CAD s/p 5V CABG with 3/5 patent bypass grafts. The graft to the left sided PL is occluded and the graft to the diagonal is occluded (both appear to be chronic occlusions) 2. Moderate disease in diagonal and left sided posterolateral branch, does not appear to be flow limiting.  3. Elevated filling pressures  Recommendations: Continue medical management of CAD. He appears to be volume overloaded. This is most likely contributing to his dyspnea and hypoxia.  Continue diuresis with IV Lasix.   ASSESSMENT & PLAN:    1. Chronic combined CHF - Euvolemic. Weight of 184lb today. Continue current medication. Check BMET.   2. Persistent atrial fibrillation/Fultter - Prior ablation. Rate controlled.   3. HTN - BP stable on current medications.   4. HLD - Continue statin. -01/30/2017: Cholesterol 89; HDL 32; LDL Cholesterol 40; Triglycerides 86; VLDL 17  5. CAD s/p CABG - No angina. Continue BB and statin. Not on ASA due to need of anticoagulation.   6. ICM s/p ICD - Continue Entresto, coreg, spitonolactone and lasix.     Medication Adjustments/Labs and Tests Ordered: Current medicines are reviewed at length with the patient today.  Concerns regarding medicines are outlined above.  Medication changes, Labs and Tests ordered today are listed in the Patient Instructions below. Patient Instructions  Your physician recommends that you continue on your current medications as directed. Please refer to the Current Medication list given to you today.   Your physician recommends that you return for lab work in: [place] recommends that you schedule a follow-up appointment in:  [place], [place], [place]  06/22/2017 11:24 AM    [address] [place], [place], [place]  [address] Phone: [phone]; Fax: [phone]

## 2017-06-22 ENCOUNTER — Ambulatory Visit: Payer: Medicare HMO | Admitting: Physician Assistant

## 2017-06-22 ENCOUNTER — Telehealth: Payer: Self-pay

## 2017-06-22 ENCOUNTER — Encounter: Payer: Self-pay | Admitting: Physician Assistant

## 2017-06-22 ENCOUNTER — Encounter (INDEPENDENT_AMBULATORY_CARE_PROVIDER_SITE_OTHER): Payer: Self-pay

## 2017-06-22 ENCOUNTER — Other Ambulatory Visit: Payer: Self-pay

## 2017-06-22 VITALS — BP 118/78 | HR 71 | Ht 74.0 in | Wt 184.0 lb

## 2017-06-22 DIAGNOSIS — I10 Essential (primary) hypertension: Secondary | ICD-10-CM

## 2017-06-22 DIAGNOSIS — I25708 Atherosclerosis of coronary artery bypass graft(s), unspecified, with other forms of angina pectoris: Secondary | ICD-10-CM | POA: Diagnosis not present

## 2017-06-22 DIAGNOSIS — I255 Ischemic cardiomyopathy: Secondary | ICD-10-CM

## 2017-06-22 DIAGNOSIS — I481 Persistent atrial fibrillation: Secondary | ICD-10-CM

## 2017-06-22 DIAGNOSIS — I5042 Chronic combined systolic (congestive) and diastolic (congestive) heart failure: Secondary | ICD-10-CM | POA: Diagnosis not present

## 2017-06-22 DIAGNOSIS — Z95810 Presence of automatic (implantable) cardiac defibrillator: Secondary | ICD-10-CM

## 2017-06-22 DIAGNOSIS — I4819 Other persistent atrial fibrillation: Secondary | ICD-10-CM

## 2017-06-22 LAB — BASIC METABOLIC PANEL
BUN / CREAT RATIO: 13 (ref 10–24)
BUN: 14 mg/dL (ref 8–27)
CO2: 24 mmol/L (ref 20–29)
CREATININE: 1.09 mg/dL (ref 0.76–1.27)
Calcium: 10 mg/dL (ref 8.6–10.2)
Chloride: 104 mmol/L (ref 96–106)
GFR calc Af Amer: 80 mL/min/{1.73_m2} (ref >59)
GFR, EST NON AFRICAN AMERICAN: 69 mL/min/{1.73_m2} (ref >59)
Glucose: 110 mg/dL — ABNORMAL HIGH (ref 65–99)
Potassium: 4.2 mmol/L (ref 3.5–5.2)
SODIUM: 140 mmol/L (ref 134–144)

## 2017-06-22 MED ORDER — SACUBITRIL-VALSARTAN 49-51 MG PO TABS: 1.0000 | ORAL_TABLET | Freq: Two times a day (BID) | ORAL | 3 refills | Status: DC

## 2017-06-22 NOTE — Patient Instructions (Signed)
Your physician recommends that you continue on your current medications as directed. Please refer to the Current Medication list given to you today.   Your physician recommends that you return for lab work in: BMET TODAY    Your physician recommends that you schedule a follow-up appointment in:  AS SCHEDULED

## 2017-06-22 NOTE — Patient Outreach (Signed)
[facility]) Care Management  06/22/2017  NHIA HEYMANN Oct 21, 1950 [id number]     EMMI-HF RED ON EMMI ALERT Day # 15 Date: 06/19/17 Red Alert Reason: " New/worsening problems? Yes"   Outreach attempt # 1 to patient. Spoke with patient. Reviewed and addressed red alert with patient.Patient denies any new and/or worsening problems. States error in response recording. He is headed shortly to MD follow up appts. RN CM confirmed patient has all of his meds. He reports no issues or concerns regarding meds as he was not started on any new meds. Patient denies any RN CM needs or concerns at this time. Advised patient that they would continue to get automated EMMI- HF post discharge calls to assess how they are doing following recent hospitalization and will receive a call from a nurse if any of their responses were abnormal. Patient voiced understanding and was appreciative of f/u call.      Plan: RN CM will close case at this time as no further interventions needed.  Antionette Fairy, RN,BSN,CCM [facility] Care Management Telephonic Care Management Coordinator Direct Phone: [phone] Toll Free: [phone] Fax: [phone]

## 2017-06-22 NOTE — Telephone Encounter (Signed)
**Note De-Identified  Obfuscation** The pt returned his completed [facility] pt support program enrollment form.  I have completed the providers part of the application, printed an [employer] and placed both in Dr Koren Bound mail bin awaiting his signature.

## 2017-06-24 ENCOUNTER — Other Ambulatory Visit: Payer: Self-pay

## 2017-06-24 NOTE — Patient Outreach (Signed)
[facility]) Care Management  06/24/2017  JESSIE GOETSCHIUS 1950/01/18 [id number]  EMMI: heart failure red alert Referral date: 06/24/17 Referral reason: weighed today: no Insurance:  [employer] Day # 19  Telephone call to patient regarding EMMI heart failure red alert. HIPAA verified with patient.  Explained reason for call. Patient states he weighs himself off and on.  He states, " I am not consistent with it. "  RNCM  Discussed and advised patient of importance of monitoring and recording weights daily.  Discussed signs / symptoms of heart failure. Patient verbalized understanding. Patient denies any symptoms of shortness of breath or swelling. He states he has had follow up with his primary MD and cardiologist. He reports he is taking his medications " religiously." RNCM discussed and offered [facility] care management program. Patient declined.  RNCM offered to send patient [facility] care management brochure for future reference. Patient verbally agreed.  RNCM explained to patient he would received ongoing EMMI heart failure automated calls and will receive call from a nurse if there is concern regarding their responses. Patient verbally agreed to ongoing EMMI automated calls.  RNCM advised patient to notify MD of any changes in condition prior to scheduled appointment. RNCM verified patient aware of 911 services for urgent/ emergent needs.    PLAN: RNCM will close patient due to refusal of services.  RNCM will send patient [facility] care management brochure, magnet, and EMMI education material on weighing.   George Ina RN,BSN,CCM [facility] Telephonic  [phone]

## 2017-06-24 NOTE — Patient Outreach (Signed)
Patient triggered Red on [place] Heart Failure Dashboard, notification sent to:  Davina Green, RN 

## 2017-06-26 NOTE — Progress Notes (Signed)
Pt has been made aware of normal result and verbalized understanding.  jw 06/26/17

## 2017-06-29 NOTE — Telephone Encounter (Signed)
Dr Graciela Husbands has signed both the application and Entresto RX and I have faxed all to [facility].

## 2017-07-31 ENCOUNTER — Other Ambulatory Visit: Payer: Self-pay | Admitting: Family Medicine

## 2017-08-10 ENCOUNTER — Other Ambulatory Visit: Payer: Self-pay | Admitting: *Deleted

## 2017-08-10 MED ORDER — RIVAROXABAN 20 MG PO TABS: ORAL_TABLET | ORAL | 6 refills | Status: DC

## 2017-08-12 ENCOUNTER — Ambulatory Visit (INDEPENDENT_AMBULATORY_CARE_PROVIDER_SITE_OTHER): Payer: Medicare HMO | Admitting: Medical

## 2017-08-12 ENCOUNTER — Ambulatory Visit (HOSPITAL_BASED_OUTPATIENT_CLINIC_OR_DEPARTMENT_OTHER)
Admission: RE | Admit: 2017-08-12 | Discharge: 2017-08-12 | Disposition: A | Discharge status: Home / Self Care | Payer: Medicare HMO | Source: Ambulatory Visit | Attending: Medical | Admitting: Medical

## 2017-08-12 ENCOUNTER — Encounter: Payer: Self-pay | Admitting: Medical

## 2017-08-12 VITALS — BP 102/60 | HR 92 | Temp 97.7°F | Resp 16 | Ht 74.0 in | Wt 180.0 lb

## 2017-08-12 DIAGNOSIS — I10 Essential (primary) hypertension: Secondary | ICD-10-CM | POA: Diagnosis not present

## 2017-08-12 DIAGNOSIS — Z95810 Presence of automatic (implantable) cardiac defibrillator: Secondary | ICD-10-CM | POA: Diagnosis not present

## 2017-08-12 DIAGNOSIS — Z4502 Encounter for adjustment and management of automatic implantable cardiac defibrillator: Secondary | ICD-10-CM | POA: Diagnosis not present

## 2017-08-12 DIAGNOSIS — F17210 Nicotine dependence, cigarettes, uncomplicated: Secondary | ICD-10-CM | POA: Diagnosis present

## 2017-08-12 DIAGNOSIS — Z806 Family history of leukemia: Secondary | ICD-10-CM | POA: Diagnosis not present

## 2017-08-12 DIAGNOSIS — I110 Hypertensive heart disease with heart failure: Secondary | ICD-10-CM | POA: Diagnosis present

## 2017-08-12 DIAGNOSIS — Z8249 Family history of ischemic heart disease and other diseases of the circulatory system: Secondary | ICD-10-CM | POA: Diagnosis not present

## 2017-08-12 DIAGNOSIS — I447 Left bundle-branch block, unspecified: Secondary | ICD-10-CM | POA: Diagnosis present

## 2017-08-12 DIAGNOSIS — I2510 Atherosclerotic heart disease of native coronary artery without angina pectoris: Secondary | ICD-10-CM | POA: Diagnosis present

## 2017-08-12 DIAGNOSIS — I5042 Chronic combined systolic (congestive) and diastolic (congestive) heart failure: Secondary | ICD-10-CM | POA: Diagnosis present

## 2017-08-12 DIAGNOSIS — I6782 Cerebral ischemia: Secondary | ICD-10-CM | POA: Insufficient documentation

## 2017-08-12 DIAGNOSIS — R0902 Hypoxemia: Secondary | ICD-10-CM | POA: Diagnosis present

## 2017-08-12 DIAGNOSIS — R9431 Abnormal electrocardiogram [ECG] [EKG]: Secondary | ICD-10-CM

## 2017-08-12 DIAGNOSIS — E785 Hyperlipidemia, unspecified: Secondary | ICD-10-CM | POA: Diagnosis present

## 2017-08-12 DIAGNOSIS — I252 Old myocardial infarction: Secondary | ICD-10-CM | POA: Diagnosis not present

## 2017-08-12 DIAGNOSIS — E669 Obesity, unspecified: Secondary | ICD-10-CM

## 2017-08-12 DIAGNOSIS — I214 Non-ST elevation (NSTEMI) myocardial infarction: Secondary | ICD-10-CM | POA: Diagnosis present

## 2017-08-12 DIAGNOSIS — R55 Syncope and collapse: Secondary | ICD-10-CM | POA: Diagnosis not present

## 2017-08-12 DIAGNOSIS — Z8679 Personal history of other diseases of the circulatory system: Secondary | ICD-10-CM | POA: Diagnosis not present

## 2017-08-12 DIAGNOSIS — E1169 Type 2 diabetes mellitus with other specified complication: Secondary | ICD-10-CM | POA: Diagnosis not present

## 2017-08-12 DIAGNOSIS — Z7984 Long term (current) use of oral hypoglycemic drugs: Secondary | ICD-10-CM | POA: Diagnosis not present

## 2017-08-12 DIAGNOSIS — R42 Dizziness and giddiness: Secondary | ICD-10-CM | POA: Diagnosis not present

## 2017-08-12 DIAGNOSIS — E119 Type 2 diabetes mellitus without complications: Secondary | ICD-10-CM | POA: Diagnosis present

## 2017-08-12 DIAGNOSIS — Z7901 Long term (current) use of anticoagulants: Secondary | ICD-10-CM | POA: Diagnosis not present

## 2017-08-12 DIAGNOSIS — Z951 Presence of aortocoronary bypass graft: Secondary | ICD-10-CM | POA: Diagnosis not present

## 2017-08-12 DIAGNOSIS — R51 Headache: Secondary | ICD-10-CM | POA: Diagnosis not present

## 2017-08-12 DIAGNOSIS — I481 Persistent atrial fibrillation: Secondary | ICD-10-CM | POA: Diagnosis present

## 2017-08-12 DIAGNOSIS — I480 Paroxysmal atrial fibrillation: Secondary | ICD-10-CM | POA: Diagnosis present

## 2017-08-12 DIAGNOSIS — I249 Acute ischemic heart disease, unspecified: Secondary | ICD-10-CM | POA: Diagnosis not present

## 2017-08-12 DIAGNOSIS — Z8349 Family history of other endocrine, nutritional and metabolic diseases: Secondary | ICD-10-CM | POA: Diagnosis not present

## 2017-08-12 DIAGNOSIS — I255 Ischemic cardiomyopathy: Secondary | ICD-10-CM | POA: Diagnosis present

## 2017-08-12 LAB — CBC WITH DIFFERENTIAL/PLATELET
BASOS PCT: 0.1 %
Basophils Absolute: 12 cells/uL (ref 0–200)
EOS PCT: 0 %
Eosinophils Absolute: 0 cells/uL — ABNORMAL LOW (ref 15–500)
HEMATOCRIT: 44.1 % (ref 38.5–50.0)
HEMOGLOBIN: 15 g/dL (ref 13.2–17.1)
LYMPHS ABS: 908 {cells}/uL (ref 850–3900)
MCH: 31.7 pg (ref 27.0–33.0)
MCHC: 34 g/dL (ref 32.0–36.0)
MCV: 93.2 fL (ref 80.0–100.0)
MPV: 10.3 fL (ref 7.5–12.5)
Monocytes Relative: 6.3 %
NEUTROS ABS: 10418 {cells}/uL — AB (ref 1500–7800)
NEUTROS PCT: 86.1 %
Platelets: 135 10*3/uL — ABNORMAL LOW (ref 140–400)
RBC: 4.73 10*6/uL (ref 4.20–5.80)
RDW: 13.4 % (ref 11.0–15.0)
Total Lymphocyte: 7.5 %
WBC mixed population: 762 cells/uL (ref 200–950)
WBC: 12.1 10*3/uL — AB (ref 3.8–10.8)

## 2017-08-12 LAB — COMPREHENSIVE METABOLIC PANEL
AG Ratio: 1.5 (calc) (ref 1.0–2.5)
ALKALINE PHOSPHATASE (APISO): 59 U/L (ref 40–115)
ALT: 28 U/L (ref 9–46)
AST: 90 U/L — AB (ref 10–35)
Albumin: 3.8 g/dL (ref 3.6–5.1)
BILIRUBIN TOTAL: 3.2 mg/dL — AB (ref 0.2–1.2)
BUN: 21 mg/dL (ref 7–25)
CALCIUM: 9.6 mg/dL (ref 8.6–10.3)
CO2: 28 mmol/L (ref 20–32)
Chloride: 101 mmol/L (ref 98–110)
Creat: 1.2 mg/dL (ref 0.70–1.25)
Globulin: 2.5 g/dL (calc) (ref 1.9–3.7)
Glucose, Bld: 74 mg/dL (ref 65–99)
Potassium: 4.3 mmol/L (ref 3.5–5.3)
Sodium: 137 mmol/L (ref 135–146)
Total Protein: 6.3 g/dL (ref 6.1–8.1)

## 2017-08-12 LAB — TROPONIN I: Troponin I: 108.58 ng/mL (ref <=0.0)

## 2017-08-12 NOTE — Progress Notes (Signed)
Subjective:    Patient ID: Larry Rangel, male    DOB: July 03, 1950, 67 y.o.   MRN: [id number]  HPI  Pt in states on Monday he was working and next thing he new he was lying on his back. He was moving some very light packages. Pt states his work environment is little hot at times. Pt has coworker who states he was down for seconds. He does not remember having any symptoms that preceded the fall. He thinks was reaction to heat. But noted no  ha after fall(small area left parietal region faint tender if touches directly. No loss of bladder control post fall. He did hit left side of head on concrete The next day felt little dizzy on Tuesday but no dizziness today. No nausea and no vomiting. No palptiation or chest pain before the fall.'  No history of any head trauma or concussions. No history of any seizures.  Pt reports no dizziness now. No ha. No gross motor or sensory functions.  Pt checks his sugars every 3 days or so and no too high or too lows. But can't remember specific numbers.   Pt has history of atrial fibrillation/Flutter. Last ekg on follow up from hospital with cardilogis  showed Atrial flutter with rare of 69.   Impression: 1. Severe triple vessel CAD s/p 5V CABG with 3/5 patent bypass grafts. The graft to the left sided PL is occluded and the graft to the diagonal is occluded (both appear to be chronic occlusions) 2. Moderate disease in diagonal and left sided posterolateral branch, does not appear to be flow limiting.  3. Elevated filling pressures   Pt has no recent pedal edema, no sob/dyspnea.    Review of Systems  Constitutional: Negative for chills, fatigue and fever.  Respiratory: Negative for cough, chest tightness, shortness of breath and wheezing.   Gastrointestinal: Negative for abdominal distention, abdominal pain, diarrhea, nausea and vomiting.  Musculoskeletal: Negative for back pain.  Skin: Negative for rash.  Neurological: Positive for syncope.  Negative for dizziness, seizures, weakness and light-headedness.       Some dizziness yesterday but none today.  Hematological: Negative for adenopathy. Does not bruise/bleed easily.  Psychiatric/Behavioral: Negative for behavioral problems, confusion, sleep disturbance and suicidal ideas.   Past Medical History:  Diagnosis Date  . Abnormal liver function 08/23/2010  . AICD (automatic cardioverter/defibrillator) present   . Anxiety   . Arthritis    "head to toe" (01/29/2017)  . CAD (coronary artery disease)    a. s/p CABG 2009. b. Cath 07/2013: 3/5 patent grafts (appear to have chronic occ grafts), mod diag/L PL branch, did not appear flow limiting, elevated filling pressures.  . CHF (congestive heart failure) ([place]) 07/30/2013  . Chicken pox as a child  . Chronic combined systolic and diastolic CHF (congestive heart failure) ([place])    a. Echo 6/14: Mild LVH, EF 30-35%, diffuse HK, MAC, mild BAE. b. Drop in EF to 20% by echo 07/2013.  Marland Kitchen Hyperlipidemia   . Hypertension   . LBBB (left bundle branch block)   . Low back pain   . Measles as a child  . Mumps as a child  . Myocardial infarction ([place]) 2009  . Paroxysmal atrial flutter ([place]) 07-2012   a. s/p ablation by Dr [name] 08-06-2012. b. Recurrence 07/2013.   Marland Kitchen Preventative health care 12/10/2014  . Tobacco user   . Type II diabetes mellitus ([place])      Social History   Socioeconomic History  .  Marital status: Married    Spouse name: Not on file  . Number of children: Not on file  . Years of education: Not on file  . Highest education level: Not on file  Occupational History  . Not on file  Social Needs  . Financial resource strain: Not on file  . Food insecurity:    Worry: Not on file    Inability: Not on file  . Transportation needs:    Medical: Not on file    Non-medical: Not on file  Tobacco Use  . Smoking status: Former Smoker    Packs/day: 1.00    Years: 40.00    Pack years: 40.00    Types: Cigarettes    Last attempt  to quit: 02/01/2011    Years since quitting: 6.5  . Smokeless tobacco: Never Used  Substance and Sexual Activity  . Alcohol use: Yes    Alcohol/week: 7.2 oz    Types: 12 Glasses of wine per week  . Drug use: No  . Sexual activity: Yes    Birth control/protection: Coitus interruptus  Lifestyle  . Physical activity:    Days per week: Not on file    Minutes per session: Not on file  . Stress: Not on file  Relationships  . Social connections:    Talks on phone: Not on file    Gets together: Not on file    Attends religious service: Not on file    Active member of club or organization: Not on file    Attends meetings of clubs or organizations: Not on file    Relationship status: Not on file  . Intimate partner violence:    Fear of current or ex partner: Not on file    Emotionally abused: Not on file    Physically abused: Not on file    Forced sexual activity: Not on file  Other Topics Concern  . 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   He has been married four times previously, the first  For 31yr and has two children by the marriage which are grown, the second time for 11 yrs and has 2 by that marriage.   Wife has custody in [place]   The third marriage was 4 1/2 yrs and most recent marriage was the past eight weeks and he is currently estranged from that person.    Past Surgical History:  Procedure Laterality Date  . ATRIAL FLUTTER ABLATION N/A 08/06/2012   Procedure: ATRIAL FLUTTER ABLATION;  Surgeon: SDeboraha Sprang MD;  [facility];  Service: Cardiovascular;  Laterality: N/A;  . CARDIAC CATHETERIZATION  2009  . CARDIOVERSION N/A 07/15/2012   Procedure: CARDIOVERSION;  Surgeon: PThayer Headings MD;  [facility];  Service: Cardiovascular;  Laterality: N/A;  . COLONOSCOPY    . CORONARY ARTERY BYPASS GRAFT  2009   x  5  . IMPLANTABLE CARDIOVERTER DEFIBRILLATOR IMPLANT N/A 11/10/2013   Procedure: SUB Q IMPLANTABLE CARDIOVERTER DEFIBRILLATOR IMPLANT;  Surgeon: SDeboraha Sprang MD;  [facility];  Service: Cardiovascular;  Laterality: N/A;  . LEFT HEART CATHETERIZATION WITH CORONARY ANGIOGRAM N/A 08/01/2013   Procedure: LEFT HEART CATHETERIZATION WITH CORONARY ANGIOGRAM;  Surgeon: CBurnell Blanks MD;  [facility];  Service: Cardiovascular;  Laterality: N/A;  . POCKET REVISION N/A 12/16/2013   Procedure: POCKET REVISION;  Surgeon: Evans Lance, MD;  [facility];  Service: Cardiovascular;  Laterality: N/A;  . SHOULDER SURGERY Left    "sewed up; sports related"  . TEE WITHOUT CARDIOVERSION N/A 07/15/2012   Procedure: TRANSESOPHAGEAL ECHOCARDIOGRAM (TEE);  Surgeon: Thayer Headings, MD;  [facility];  Service: Cardiovascular;  Laterality: N/A;  . TONSILLECTOMY      Family History  Problem Relation Age of Onset  . Alzheimer's disease Mother   . Heart failure Father 52  . Hypertension Father   . Hyperlipidemia Father   . Cancer Father        lung- took half of a young- smoker  . Heart disease Father        MI at 15  . Leukemia Brother   . Drug abuse Daughter        heroine  . Early death Neg Hx   . Kidney disease Neg Hx   . Stroke Neg Hx     No Known Allergies  Current Outpatient Medications on File Prior to Visit  Medication Sig Dispense Refill  . albuterol (PROVENTIL HFA;VENTOLIN HFA) 108 (90 BASE) MCG/ACT inhaler Inhale 2 puffs into the lungs every 6 (six) hours as needed for wheezing or shortness of breath. 1 Inhaler 2  . atorvastatin (LIPITOR) 20 MG tablet TAKE 1 TABLET (20 MG TOTAL) BY MOUTH DAILY. 90 tablet 0  . busPIRone (BUSPAR) 10 MG tablet TAKE 1 TABLET BY MOUTH TWICE A DAY 180 tablet 1  . carvedilol (COREG) 12.5 MG tablet TAKE 1 TABLET BY MOUTH TWICE DAILY WITH A MEAL 60 tablet 7  . clonazePAM (KLONOPIN) 0.5 MG tablet TAKE 1 TABLET BY MOUTH  THREE TIMES A DAY AS NEEDED 60 tablet 0  . furosemide (LASIX) 40 MG tablet Take 2 tablets (80 mg total) by mouth daily. Take 2 tabs daily for the next 2 days, then 1 tab daily. If weight up > 3lbs overnight>>2 tabs 36 tablet 7  . glipiZIDE (GLUCOTROL) 5 MG tablet TAKE 1/2 TABLET (2.5 MG TOTAL) BY MOUTH ONCE A DAY BEFORE BREAKFAST (Patient taking differently: 2.5MG BY MOUTH ONCE DAILY BEFORE BREAKFAST) 15 tablet 12  . metFORMIN (GLUCOPHAGE) 500 MG tablet TAKE 1 TABLET BY MOUTH TWICE DAILY WITH A MEAL 60 tablet 0  . rivaroxaban (XARELTO) 20 MG TABS tablet TAKE 1 TABLET BY MOUTH EVERY DAY WITH SUPPER 30 tablet 6  . sacubitril-valsartan (ENTRESTO) 49-51 MG Take 1 tablet by mouth 2 (two) times daily. 180 tablet 3  . spironolactone (ALDACTONE) 25 MG tablet Take 0.5 tablets (12.5 mg total) by mouth daily. 15 tablet 3   No current facility-administered medications on file prior to visit.     BP 102/60   Pulse 92   Temp 97.7 F (36.5 C) (Oral)   Resp 16   Ht _0  (1.88 m)   Wt 180 lb (81.6 kg)   SpO2 98%   BMI 23.11 kg/m       Objective:   Physical Exam   General Mental Status- Alert. General Appearance- Not in acute distress.   Skin General: Color- Normal Color. Moisture- Normal Moisture.  Neck Carotid Arteries- Normal color. Moisture- Normal Moisture. No carotid bruits. No JVD.  Chest and Lung Exam Auscultation: Breath Sounds:-Normal.  Cardiovascular Auscultation:Rythm- Regular. Murmurs & Other Heart Sounds:Auscultation of the heart reveals- No Murmurs.  Abdomen Inspection:-Inspeection Normal. Palpation/Percussion:Note:No mass. Palpation and Percussion of the abdomen reveal- Non Tender, Non Distended + BS, no rebound or guarding.    Neurologic Cranial  Nerve exam:- CN III-XII intact(No nystagmus), symmetric smile. Drift Test:- No drift. Romberg Exam:- Negative.  Heal to Toe Gait exam:-Normal. Finger to Nose:- Normal/Intact Strength:- 5/5 equal and symmetric strength  both upper and lower extremities.  Cranium- no obvious hematoma of scalp. Left parietal area small area. Mild tender.  Lower extremity no pedal edema.    Assessment & Plan:  For your history of recent syncope, I do want you to get a CBC, CMP and will also get cardiac or protein.  In addition I did place order for you to get CT of head stat.  You can go down now to radiology after you get the labs done.  Your EKG did look similar.  Similar to May 14, 2017 EKG.  Today shows sinus rhythm occasional PAC.  Poor connection at V1 V2 lead.  I do want you to make sure that you are well-hydrated at work.  Consider hydration with propel fitness water or sugar-free Gatorade.  Your blood pressure is a little bit low today.  Also the fact that you are on diuretic may lead to some dehydration.  We need to proceed with caution due to your cardiac history.  If you have any recurrent syncope you need to be seen at the emergency department at that time.  Also will send referral note your cardiologist to inform them of recent syncope and see if they want to see you back in light of that recent event.   You have no recent symptoms such as dizziness, blurred vision or gross motor/sensory function deficits.  When you do return to work if you have any recurrent such symptoms then would want you to stay off work for 1 week and at rest your brain.  With your mechanism of injury he probably had mild concussion.  Though presently normal neurologic exam and no associated concussion type symptoms presently.  Follow-up in 7 days with PCP or as needed.  Giving return to work note for tomorrow but only to return if labs and imagine negative and no neurologic type signs or symptoms.  40 minutes spent with patient.  Evaluation was for syncope in  patient with significant cardiac history.  Need to research and review cardiologist notes.  50% of time spent counseling patient work-up and direction that we will go.  He was very  anxious to get back to work as soon as possible.  So I did need explain to him that I would clear him to return to work tomorrow night  provided all labs and imaging did not have any significant findings.  Also counseled with him regarding that if he were to return to work and had any recurrent symptoms and I would want him to take a break from work for at least a 1week.  Will need to contact patient after stat labs are back as well as stat imaging studies.  In addition counseled him that if he does get recurrent syncope in the future that it would be best for him to be evaluated at that time in the emergency department to evaluate most probable cause.  In addition I did counsel patient regarding his lower and blood pressure.  He does use diuretics and this could lead to dehydration and subsequent syncope.  So stressed hydration with propel and also advised to be careful upon changing positions sitting to standing.  Make sure he gets balance before ambulates.

## 2017-08-12 NOTE — Patient Instructions (Addendum)
For your history of recent syncope, I do want you to get a CBC, CMP and will also get cardiac or protein.  In addition I did place order for you to get CT of head stat.  You can go down now to radiology after you get the labs done.  Your EKG did look similar to May 14, 2017 EKG. But today shows sinus rhythm occasional PAC.  Poor connection at V1 V2 lead. MA adjusted lead but same appearance.  I do want you to make sure that you are well-hydrated at work.  Consider hydration with propel fitness water or sugar-free Gatorade.  Your blood pressure is a little bit low today.  Also the fact that you are on diuretic may lead to some dehydration.  We need to proceed with caution due to your cardiac history.  If you have any recurrent syncope you need to be seen at the emergency department at that time.  Also will send referral note your cardiologist to inform them of recent syncope and see if they want to see you back in light of that recent event.   You have no recent symptoms such as dizziness, blurred vision or gross motor/sensory function deficits.  When you do return to work if you have any recurrent such symptoms then would want you to stay off work for 1 week and at rest your brain.  With your mechanism of injury he probably had mild concussion.  Though presently normal neurologic exam and no associated concussion type symptoms presently.  Follow-up in 7 days with PCP or as needed.

## 2017-08-13 ENCOUNTER — Encounter (HOSPITAL_COMMUNITY)
Admission: EM | Disposition: A | Discharge status: Home / Self Care | Payer: Self-pay | Source: Home / Self Care | Attending: Cardiology

## 2017-08-13 ENCOUNTER — Telehealth: Payer: Self-pay | Admitting: Internal Medicine

## 2017-08-13 ENCOUNTER — Observation Stay (HOSPITAL_COMMUNITY): Payer: Medicare HMO

## 2017-08-13 ENCOUNTER — Other Ambulatory Visit: Payer: Self-pay

## 2017-08-13 ENCOUNTER — Encounter (HOSPITAL_COMMUNITY): Payer: Self-pay

## 2017-08-13 ENCOUNTER — Inpatient Hospital Stay (HOSPITAL_COMMUNITY)
Admission: EM | Admit: 2017-08-13 | Discharge: 2017-08-15 | DRG: 281 | Disposition: A | Discharge status: Home / Self Care | Payer: Medicare HMO | Attending: Cardiology | Admitting: Cardiology

## 2017-08-13 DIAGNOSIS — R0902 Hypoxemia: Secondary | ICD-10-CM | POA: Diagnosis present

## 2017-08-13 DIAGNOSIS — E785 Hyperlipidemia, unspecified: Secondary | ICD-10-CM | POA: Diagnosis present

## 2017-08-13 DIAGNOSIS — R55 Syncope and collapse: Secondary | ICD-10-CM | POA: Diagnosis not present

## 2017-08-13 DIAGNOSIS — I255 Ischemic cardiomyopathy: Secondary | ICD-10-CM | POA: Diagnosis present

## 2017-08-13 DIAGNOSIS — I110 Hypertensive heart disease with heart failure: Secondary | ICD-10-CM | POA: Diagnosis present

## 2017-08-13 DIAGNOSIS — I480 Paroxysmal atrial fibrillation: Secondary | ICD-10-CM | POA: Diagnosis present

## 2017-08-13 DIAGNOSIS — E119 Type 2 diabetes mellitus without complications: Secondary | ICD-10-CM | POA: Diagnosis present

## 2017-08-13 DIAGNOSIS — Z7901 Long term (current) use of anticoagulants: Secondary | ICD-10-CM

## 2017-08-13 DIAGNOSIS — I214 Non-ST elevation (NSTEMI) myocardial infarction: Principal | ICD-10-CM | POA: Diagnosis present

## 2017-08-13 DIAGNOSIS — Z951 Presence of aortocoronary bypass graft: Secondary | ICD-10-CM

## 2017-08-13 DIAGNOSIS — I2510 Atherosclerotic heart disease of native coronary artery without angina pectoris: Secondary | ICD-10-CM | POA: Diagnosis present

## 2017-08-13 DIAGNOSIS — Z95810 Presence of automatic (implantable) cardiac defibrillator: Secondary | ICD-10-CM

## 2017-08-13 DIAGNOSIS — I447 Left bundle-branch block, unspecified: Secondary | ICD-10-CM | POA: Diagnosis present

## 2017-08-13 DIAGNOSIS — Z8349 Family history of other endocrine, nutritional and metabolic diseases: Secondary | ICD-10-CM

## 2017-08-13 DIAGNOSIS — Z8679 Personal history of other diseases of the circulatory system: Secondary | ICD-10-CM | POA: Insufficient documentation

## 2017-08-13 DIAGNOSIS — I249 Acute ischemic heart disease, unspecified: Secondary | ICD-10-CM | POA: Diagnosis present

## 2017-08-13 DIAGNOSIS — Z7984 Long term (current) use of oral hypoglycemic drugs: Secondary | ICD-10-CM

## 2017-08-13 DIAGNOSIS — Z8249 Family history of ischemic heart disease and other diseases of the circulatory system: Secondary | ICD-10-CM

## 2017-08-13 DIAGNOSIS — I481 Persistent atrial fibrillation: Secondary | ICD-10-CM | POA: Diagnosis present

## 2017-08-13 DIAGNOSIS — I10 Essential (primary) hypertension: Secondary | ICD-10-CM | POA: Diagnosis present

## 2017-08-13 DIAGNOSIS — F17210 Nicotine dependence, cigarettes, uncomplicated: Secondary | ICD-10-CM | POA: Diagnosis present

## 2017-08-13 DIAGNOSIS — Z806 Family history of leukemia: Secondary | ICD-10-CM

## 2017-08-13 DIAGNOSIS — I5042 Chronic combined systolic (congestive) and diastolic (congestive) heart failure: Secondary | ICD-10-CM | POA: Diagnosis present

## 2017-08-13 DIAGNOSIS — I5043 Acute on chronic combined systolic (congestive) and diastolic (congestive) heart failure: Secondary | ICD-10-CM | POA: Diagnosis present

## 2017-08-13 DIAGNOSIS — I252 Old myocardial infarction: Secondary | ICD-10-CM

## 2017-08-13 HISTORY — PX: LEFT HEART CATH AND CORS/GRAFTS ANGIOGRAPHY: CATH118250

## 2017-08-13 LAB — BASIC METABOLIC PANEL
Anion gap: 11 (ref 5–15)
BUN: 21 mg/dL (ref 8–23)
CALCIUM: 9.6 mg/dL (ref 8.9–10.3)
CO2: 25 mmol/L (ref 22–32)
CREATININE: 1.27 mg/dL — AB (ref 0.61–1.24)
Chloride: 103 mmol/L (ref 98–111)
GFR calc Af Amer: >60 mL/min (ref >60)
GFR calc non Af Amer: 56 mL/min — ABNORMAL LOW (ref >60)
GLUCOSE: 112 mg/dL — AB (ref 70–99)
Potassium: 3.8 mmol/L (ref 3.5–5.1)
Sodium: 139 mmol/L (ref 135–145)

## 2017-08-13 LAB — GLUCOSE, CAPILLARY
GLUCOSE-CAPILLARY: 73 mg/dL (ref 70–99)
Glucose-Capillary: 78 mg/dL (ref 70–99)
Glucose-Capillary: 124 mg/dL — ABNORMAL HIGH (ref 70–99)

## 2017-08-13 LAB — CBC
HCT: 45.7 % (ref 39.0–52.0)
Hemoglobin: 14.6 g/dL (ref 13.0–17.0)
MCH: 31.1 pg (ref 26.0–34.0)
MCHC: 31.9 g/dL (ref 30.0–36.0)
MCV: 97.2 fL (ref 78.0–100.0)
Platelets: 124 10*3/uL — ABNORMAL LOW (ref 150–400)
RBC: 4.7 MIL/uL (ref 4.22–5.81)
RDW: 14 % (ref 11.5–15.5)
WBC: 9.6 10*3/uL (ref 4.0–10.5)

## 2017-08-13 LAB — I-STAT TROPONIN, ED: Troponin i, poc: 28.68 ng/mL (ref 0.00–0.08)

## 2017-08-13 LAB — TROPONIN I
TROPONIN I: 29.83 ng/mL — AB (ref <0.03)
TROPONIN I: 30.65 ng/mL — AB (ref <0.03)
Troponin I: 35.31 ng/mL (ref <0.03)

## 2017-08-13 LAB — MRSA PCR SCREENING: MRSA by PCR: POSITIVE — AB

## 2017-08-13 SURGERY — LEFT HEART CATH AND CORS/GRAFTS ANGIOGRAPHY
Anesthesia: LOCAL

## 2017-08-13 MED ORDER — SODIUM CHLORIDE 0.9% FLUSH: 3.0000 mL | Freq: Two times a day (BID) | INTRAVENOUS | Status: DC

## 2017-08-13 MED ORDER — SODIUM CHLORIDE 0.9% FLUSH: 3.0000 mL | INTRAVENOUS | Status: DC | PRN

## 2017-08-13 MED ORDER — CLOPIDOGREL BISULFATE 300 MG PO TABS
600.0000 mg | ORAL_TABLET | Freq: Once | ORAL | Status: AC
  Administered 2017-08-13: 600 mg via ORAL
  Filled 2017-08-13: qty 2

## 2017-08-13 MED ORDER — HEPARIN (PORCINE) IN NACL 1000-0.9 UT/500ML-% IV SOLN
INTRAVENOUS | Status: DC | PRN
  Administered 2017-08-13 (×2): 500 mL

## 2017-08-13 MED ORDER — FENTANYL CITRATE (PF) 100 MCG/2ML IJ SOLN
INTRAMUSCULAR | Status: AC
  Filled 2017-08-13: qty 2

## 2017-08-13 MED ORDER — ASPIRIN 81 MG PO CHEW: 81.0000 mg | CHEWABLE_TABLET | ORAL | Status: DC

## 2017-08-13 MED ORDER — ATORVASTATIN CALCIUM 20 MG PO TABS: 20.0000 mg | ORAL_TABLET | Freq: Every day | ORAL | Status: DC

## 2017-08-13 MED ORDER — SODIUM CHLORIDE 0.9% FLUSH
3.0000 mL | Freq: Two times a day (BID) | INTRAVENOUS | Status: DC
  Administered 2017-08-15: 3 mL via INTRAVENOUS

## 2017-08-13 MED ORDER — SODIUM CHLORIDE 0.9 % IV SOLN: 250.0000 mL | INTRAVENOUS | Status: DC | PRN

## 2017-08-13 MED ORDER — CLOPIDOGREL BISULFATE 75 MG PO TABS
75.0000 mg | ORAL_TABLET | Freq: Every day | ORAL | Status: DC
  Administered 2017-08-14 – 2017-08-15 (×2): 75 mg via ORAL
  Filled 2017-08-13 (×2): qty 1

## 2017-08-13 MED ORDER — NITROGLYCERIN 0.4 MG SL SUBL: 0.4000 mg | SUBLINGUAL_TABLET | SUBLINGUAL | Status: DC | PRN

## 2017-08-13 MED ORDER — SACUBITRIL-VALSARTAN 49-51 MG PO TABS: 1.0000 | ORAL_TABLET | Freq: Two times a day (BID) | ORAL | Status: DC

## 2017-08-13 MED ORDER — INSULIN ASPART 100 UNIT/ML ~~LOC~~ SOLN: 0.0000 [IU] | Freq: Every day | SUBCUTANEOUS | Status: DC

## 2017-08-13 MED ORDER — FENTANYL CITRATE (PF) 100 MCG/2ML IJ SOLN
INTRAMUSCULAR | Status: DC | PRN
  Administered 2017-08-13: 25 ug via INTRAVENOUS

## 2017-08-13 MED ORDER — HEPARIN (PORCINE) IN NACL 100-0.45 UNIT/ML-% IJ SOLN
1350.0000 [IU]/h | INTRAMUSCULAR | Status: DC
  Administered 2017-08-13: 1000 [IU]/h via INTRAVENOUS
  Administered 2017-08-14: 1350 [IU]/h via INTRAVENOUS
  Filled 2017-08-13 (×2): qty 250

## 2017-08-13 MED ORDER — IOPAMIDOL (ISOVUE-370) INJECTION 76%
INTRAVENOUS | Status: DC | PRN
  Administered 2017-08-13: 90 mL

## 2017-08-13 MED ORDER — MIDAZOLAM HCL 2 MG/2ML IJ SOLN
INTRAMUSCULAR | Status: DC | PRN
  Administered 2017-08-13: 1 mg via INTRAVENOUS

## 2017-08-13 MED ORDER — ACETAMINOPHEN 325 MG PO TABS: 650.0000 mg | ORAL_TABLET | ORAL | Status: DC | PRN

## 2017-08-13 MED ORDER — VERAPAMIL HCL 2.5 MG/ML IV SOLN
INTRAVENOUS | Status: AC
  Filled 2017-08-13: qty 2

## 2017-08-13 MED ORDER — ASPIRIN EC 81 MG PO TBEC
81.0000 mg | DELAYED_RELEASE_TABLET | Freq: Every day | ORAL | Status: DC
  Administered 2017-08-14 – 2017-08-15 (×2): 81 mg via ORAL
  Filled 2017-08-13 (×2): qty 1

## 2017-08-13 MED ORDER — LIDOCAINE HCL (PF) 1 % IJ SOLN
INTRAMUSCULAR | Status: AC
  Filled 2017-08-13: qty 30

## 2017-08-13 MED ORDER — HEPARIN SODIUM (PORCINE) 1000 UNIT/ML IJ SOLN
INTRAMUSCULAR | Status: DC | PRN
  Administered 2017-08-13: 4000 [IU] via INTRAVENOUS

## 2017-08-13 MED ORDER — ALBUTEROL SULFATE (2.5 MG/3ML) 0.083% IN NEBU: 3.0000 mL | INHALATION_SOLUTION | Freq: Four times a day (QID) | RESPIRATORY_TRACT | Status: DC | PRN

## 2017-08-13 MED ORDER — ONDANSETRON HCL 4 MG/2ML IJ SOLN: 4.0000 mg | Freq: Four times a day (QID) | INTRAMUSCULAR | Status: DC | PRN

## 2017-08-13 MED ORDER — INSULIN ASPART 100 UNIT/ML ~~LOC~~ SOLN: 0.0000 [IU] | Freq: Three times a day (TID) | SUBCUTANEOUS | Status: DC

## 2017-08-13 MED ORDER — IOPAMIDOL (ISOVUE-370) INJECTION 76%
INTRAVENOUS | Status: AC
  Filled 2017-08-13: qty 125

## 2017-08-13 MED ORDER — SODIUM CHLORIDE 0.9 % IV SOLN: INTRAVENOUS | Status: DC

## 2017-08-13 MED ORDER — BUSPIRONE HCL 10 MG PO TABS
10.0000 mg | ORAL_TABLET | Freq: Two times a day (BID) | ORAL | Status: DC
  Administered 2017-08-13 – 2017-08-15 (×4): 10 mg via ORAL
  Filled 2017-08-13 (×4): qty 1

## 2017-08-13 MED ORDER — LIDOCAINE HCL (PF) 1 % IJ SOLN
INTRAMUSCULAR | Status: DC | PRN
  Administered 2017-08-13: 2 mL

## 2017-08-13 MED ORDER — RIVAROXABAN 20 MG PO TABS: 20.0000 mg | ORAL_TABLET | Freq: Every day | ORAL | Status: DC

## 2017-08-13 MED ORDER — CARVEDILOL 12.5 MG PO TABS
12.5000 mg | ORAL_TABLET | Freq: Two times a day (BID) | ORAL | Status: DC
  Administered 2017-08-14 – 2017-08-15 (×3): 12.5 mg via ORAL
  Filled 2017-08-13 (×3): qty 1

## 2017-08-13 MED ORDER — HEPARIN (PORCINE) IN NACL 1000-0.9 UT/500ML-% IV SOLN
INTRAVENOUS | Status: AC
  Filled 2017-08-13: qty 1000

## 2017-08-13 MED ORDER — SPIRONOLACTONE 12.5 MG HALF TABLET
12.5000 mg | ORAL_TABLET | Freq: Every day | ORAL | Status: DC
  Administered 2017-08-14 – 2017-08-15 (×2): 12.5 mg via ORAL
  Filled 2017-08-13 (×2): qty 1

## 2017-08-13 MED ORDER — MIDAZOLAM HCL 2 MG/2ML IJ SOLN
INTRAMUSCULAR | Status: AC
  Filled 2017-08-13: qty 2

## 2017-08-13 MED ORDER — VERAPAMIL HCL 2.5 MG/ML IV SOLN
INTRAVENOUS | Status: DC | PRN
  Administered 2017-08-13: 10 mL via INTRA_ARTERIAL

## 2017-08-13 MED ORDER — HEPARIN SODIUM (PORCINE) 1000 UNIT/ML IJ SOLN
INTRAMUSCULAR | Status: AC
  Filled 2017-08-13: qty 1

## 2017-08-13 MED ORDER — SODIUM CHLORIDE 0.9 % IV SOLN
INTRAVENOUS | Status: AC
  Administered 2017-08-13: 17:00:00 via INTRAVENOUS

## 2017-08-13 MED ORDER — CLONAZEPAM 0.5 MG PO TABS
0.5000 mg | ORAL_TABLET | Freq: Three times a day (TID) | ORAL | Status: DC | PRN
  Administered 2017-08-14 – 2017-08-15 (×2): 0.5 mg via ORAL
  Filled 2017-08-13 (×2): qty 1

## 2017-08-13 SURGICAL SUPPLY — 10 items
CATH EXPO 5F MPA-1 (CATHETERS) ×2 IMPLANT
CATH INFINITI 5FR MULTPACK ANG (CATHETERS) ×2 IMPLANT
DEVICE RAD COMP TR BAND LRG (VASCULAR PRODUCTS) ×2 IMPLANT
GLIDESHEATH SLEND SS 6F .021 (SHEATH) ×2 IMPLANT
GUIDEWIRE INQWIRE 1.5J.035X260 (WIRE) ×1 IMPLANT
INQWIRE 1.5J .035X260CM (WIRE) ×2
KIT HEART LEFT (KITS) ×2 IMPLANT
PACK CARDIAC CATHETERIZATION (CUSTOM PROCEDURE TRAY) ×2 IMPLANT
TRANSDUCER W/STOPCOCK (MISCELLANEOUS) ×2 IMPLANT
TUBING CIL FLEX 10 FLL-RA (TUBING) ×2 IMPLANT

## 2017-08-13 NOTE — ED Provider Notes (Signed)
[place] EMERGENCY DEPARTMENT Provider Note   CSN: [id number] Arrival date & time: 08/13/17  7425     History   Chief Complaint Chief Complaint  Patient presents with  . Abnormal Lab    HPI Larry Rangel is a 67 y.o. male.  HPI Patient with history of CABG in 2009 had a syncopal episode 3 days ago while working in the heat.  No presyncopal symptoms.  States he fell and struck the back of his head.  Wife noted some mild confusion afterwards.  Thinks episode of unconsciousness was only a few minutes.  He denied any chest pain or shortness of breath prior to symptoms or since.  He has had some lightheadedness especially with standing.  Went to see his primary doctor yesterday and had a troponin drawn at the time.  Was called today and advised to go to the emergency department because this level was elevated.  He continues to deny any chest pain or pressure.  He denies any shortness of breath.  Has mild scalp pain.  No visual changes, focal weakness, numbness, nausea or vomiting. Past Medical History:  Diagnosis Date  . Abnormal liver function 08/23/2010  . AICD (automatic cardioverter/defibrillator) present   . Anxiety   . Arthritis    "head to toe" (01/29/2017)  . CAD (coronary artery disease)    a. s/p CABG 2009. b. Cath 07/2013: 3/5 patent grafts (appear to have chronic occ grafts), mod diag/L PL branch, did not appear flow limiting, elevated filling pressures.  . CHF (congestive heart failure) ([place]) 07/30/2013  . Chicken pox as a child  . Chronic combined systolic and diastolic CHF (congestive heart failure) ([place])    a. Echo 6/14: Mild LVH, EF 30-35%, diffuse HK, MAC, mild BAE. b. Drop in EF to 20% by echo 07/2013.  Marland Kitchen Hyperlipidemia   . Hypertension   . LBBB (left bundle branch block)   . Low back pain   . Measles as a child  . Mumps as a child  . Myocardial infarction ([place]) 2009  . Paroxysmal atrial flutter ([place]) 07-2012   a. s/p ablation by Dr [name]  08-06-2012. b. Recurrence 07/2013.   Marland Kitchen Preventative health care 12/10/2014  . Tobacco user   . Type II diabetes mellitus [facility])     Patient Active Problem List   Diagnosis Date Noted  . ACS (acute coronary syndrome) ([place]) 08/13/2017  . Acute on chronic combined systolic and diastolic CHF (congestive heart failure) ([place]) 06/01/2017  . S/P CABG (coronary artery bypass graft)   . Longstanding persistent atrial fibrillation ([place])   . Memory loss 05/22/2017  . Elevated troponin I level   . Acute on chronic combined systolic (congestive) and diastolic (congestive) heart failure ([place]) 01/29/2017  . Pulmonary emphysema ([place]) 04/17/2015  . Preventative health care 12/10/2014  . Hematoma of implantable cardioverter-defibrillator (ICD) pocket 12/16/2013  . Ischemic cardiomyopathy 11/10/2013  . LBBB (left bundle branch block) 08/15/2013  . CAD (coronary artery disease)   . Paroxysmal atrial fibrillation ([place]) 08/02/2013  . Demand ischemia ([place]) 08/02/2013  . CHF (congestive heart failure) ([place]) 07/30/2013  . Medicare welcome exam 06/12/2013  . Microalbuminuria 11/13/2012  . Anxiety state 11/13/2012  . OSA (obstructive sleep apnea) 07/22/2012  . Diabetes mellitus type 2 in obese ([place]) 03/17/2011  . Abnormal liver function 08/23/2010  . Essential hypertension 10/04/2009  . Hyperlipidemia 09/10/2009    Past Surgical History:  Procedure Laterality Date  . ATRIAL FLUTTER ABLATION N/A 08/06/2012  Procedure: ATRIAL FLUTTER ABLATION;  Surgeon: Deboraha Sprang, MD;  Location: [facility] CATH LAB;  Service: Cardiovascular;  Laterality: N/A;  . CARDIAC CATHETERIZATION  2009  . CARDIOVERSION N/A 07/15/2012   Procedure: CARDIOVERSION;  Surgeon: Thayer Headings, MD;  Location: [facility] ENDOSCOPY;  Service: Cardiovascular;  Laterality: N/A;  . COLONOSCOPY    . CORONARY ARTERY BYPASS GRAFT  2009   x 5  . IMPLANTABLE CARDIOVERTER DEFIBRILLATOR IMPLANT N/A 11/10/2013   Procedure: SUB Q IMPLANTABLE CARDIOVERTER  DEFIBRILLATOR IMPLANT;  Surgeon: Deboraha Sprang, MD;  Location: [facility] CATH LAB;  Service: Cardiovascular;  Laterality: N/A;  . LEFT HEART CATHETERIZATION WITH CORONARY ANGIOGRAM N/A 08/01/2013   Procedure: LEFT HEART CATHETERIZATION WITH CORONARY ANGIOGRAM;  Surgeon: Burnell Blanks, MD;  Location: [facility] CATH LAB;  Service: Cardiovascular;  Laterality: N/A;  . POCKET REVISION N/A 12/16/2013   Procedure: POCKET REVISION;  Surgeon: Evans Lance, MD;  Location: [facility] CATH LAB;  Service: Cardiovascular;  Laterality: N/A;  . SHOULDER SURGERY Left    "sewed up; sports related"  . TEE WITHOUT CARDIOVERSION N/A 07/15/2012   Procedure: TRANSESOPHAGEAL ECHOCARDIOGRAM (TEE);  Surgeon: Thayer Headings, MD;  Location: [place];  Service: Cardiovascular;  Laterality: N/A;  . TONSILLECTOMY          Home Medications    Prior to Admission medications   Medication Sig Start Date End Date Taking? Authorizing Provider  albuterol (PROVENTIL HFA;VENTOLIN HFA) 108 (90 BASE) MCG/ACT inhaler Inhale 2 puffs into the lungs every 6 (six) hours as needed for wheezing or shortness of breath. 12/05/14   Mosie Lukes, MD  atorvastatin (LIPITOR) 20 MG tablet TAKE 1 TABLET (20 MG TOTAL) BY MOUTH DAILY. 01/07/17   Mosie Lukes, MD  busPIRone (BUSPAR) 10 MG tablet TAKE 1 TABLET BY MOUTH TWICE A DAY 07/31/17   Mosie Lukes, MD  carvedilol (COREG) 12.5 MG tablet TAKE 1 TABLET BY MOUTH TWICE DAILY WITH A MEAL 12/08/16   Deboraha Sprang, MD  clonazePAM (KLONOPIN) 0.5 MG tablet TAKE 1 TABLET BY MOUTH THREE TIMES A DAY AS NEEDED 04/30/17   Mosie Lukes, MD  furosemide (LASIX) 40 MG tablet Take 2 tablets (80 mg total) by mouth daily. Take 2 tabs daily for the next 2 days, then 1 tab daily. If weight up > 3lbs overnight>>2 tabs 06/03/17   Barrett, Evelene Croon, PA-C  glipiZIDE (GLUCOTROL) 5 MG tablet TAKE 1/2 TABLET (2.5 MG TOTAL) BY MOUTH ONCE A DAY BEFORE BREAKFAST Patient taking differently: 2.5MG BY MOUTH ONCE DAILY BEFORE  BREAKFAST 09/16/16   Mosie Lukes, MD  metFORMIN (GLUCOPHAGE) 500 MG tablet TAKE 1 TABLET BY MOUTH TWICE DAILY WITH A MEAL 01/09/17   Mosie Lukes, MD  rivaroxaban (XARELTO) 20 MG TABS tablet TAKE 1 TABLET BY MOUTH EVERY DAY WITH SUPPER 08/10/17   Deboraha Sprang, MD  sacubitril-valsartan (ENTRESTO) 49-51 MG Take 1 tablet by mouth 2 (two) times daily. 06/22/17   Deboraha Sprang, MD  spironolactone (ALDACTONE) 25 MG tablet Take 0.5 tablets (12.5 mg total) by mouth daily. 06/07/17 07/07/17  Barrett, Evelene Croon, PA-C    Family History Family History  Problem Relation Age of Onset  . Alzheimer's disease Mother   . Heart failure Father 43  . Hypertension Father   . Hyperlipidemia Father   . Cancer Father        lung- took half of a young- smoker  . Heart disease Father        MI  at 40  . Leukemia Brother   . Drug abuse Daughter        heroine  . Early death Neg Hx   . Kidney disease Neg Hx   . Stroke Neg Hx     Social History Social History   Tobacco Use  . Smoking status: Former Smoker    Packs/day: 1.00    Years: 40.00    Pack years: 40.00    Types: Cigarettes    Last attempt to quit: 02/01/2011    Years since quitting: 6.5  . Smokeless tobacco: Never Used  Substance Use Topics  . Alcohol use: Yes    Alcohol/week: 7.2 oz    Types: 12 Glasses of wine per week  . Drug use: No     Allergies   Patient has no known allergies.   Review of Systems Review of Systems  Constitutional: Negative for chills and fever.  HENT: Negative for sore throat and trouble swallowing.   Eyes: Negative for visual disturbance.  Respiratory: Negative for cough and shortness of breath.   Cardiovascular: Negative for chest pain, palpitations and leg swelling.  Gastrointestinal: Negative for abdominal pain, constipation, diarrhea, nausea and vomiting.  Genitourinary: Negative for dysuria, flank pain and frequency.  Musculoskeletal: Negative for back pain, myalgias, neck pain and neck stiffness.   Skin: Negative for rash and wound.  Neurological: Positive for syncope, light-headedness and headaches. Negative for dizziness, weakness and numbness.  All other systems reviewed and are negative.    Physical Exam Updated Vital Signs BP 118/90   Pulse 70   Temp 97.9 F (36.6 C) (Oral)   Resp 18   Ht 6' 2"  (1.88 m)   Wt 81.6 kg (180 lb)   SpO2 99%   BMI 23.11 kg/m   Physical Exam  Constitutional: He is oriented to person, place, and time. He appears well-developed and well-nourished. No distress.  HENT:  Head: Normocephalic.  Mouth/Throat: Oropharynx is clear and moist. No oropharyngeal exudate.  Left posterior scalp hematoma.  No intraoral trauma.  Midface is stable.  Eyes: Pupils are equal, round, and reactive to light. EOM are normal.  Neck: Normal range of motion. Neck supple. No JVD present.  No posterior midline cervical tenderness to palpation.  Cardiovascular: Normal rate and regular rhythm. Exam reveals no gallop and no friction rub.  No murmur heard. Pulmonary/Chest: Effort normal and breath sounds normal. No stridor. No respiratory distress. He has no wheezes. He has no rales. He exhibits no tenderness.  Abdominal: Soft. Bowel sounds are normal. There is no tenderness. There is no rebound and no guarding.  Musculoskeletal: Normal range of motion. He exhibits no edema or tenderness.  No lower extremity swelling, asymmetry or tenderness.  Lymphadenopathy:    He has no cervical adenopathy.  Neurological: He is alert and oriented to person, place, and time.  Mild confusion.  5/5 motor in all extremities.  Sensation intact.  Skin: Skin is warm and dry. Capillary refill takes less than 2 seconds. No rash noted. He is not diaphoretic. No erythema.  Psychiatric: He has a normal mood and affect. His behavior is normal.  Nursing note and vitals reviewed.    ED Treatments / Results  Labs (all labs ordered are listed, but only abnormal results are displayed) Labs  Reviewed  BASIC METABOLIC PANEL - Abnormal; Notable for the following components:      Result Value   Glucose, Bld 112 (*)    Creatinine, Ser 1.27 (*)    GFR calc  non Af Amer 56 (*)    All other components within normal limits  CBC - Abnormal; Notable for the following components:   Platelets 124 (*)    All other components within normal limits  I-STAT TROPONIN, ED - Abnormal; Notable for the following components:   Troponin i, poc 28.68 (*)    All other components within normal limits  HIV ANTIBODY (ROUTINE TESTING)  TROPONIN I  TROPONIN I  TROPONIN I    EKG EKG Interpretation  Date/Time:  Thursday August 13 2017 08:27:29 EDT Ventricular Rate:  64 PR Interval:  180 QRS Duration: 116 QT Interval:  448 QTC Calculation: 462 R Axis:   -3 Text Interpretation:  Sinus rhythm with Premature atrial complexes Possible Left atrial enlargement Septal infarct , age undetermined ST & T wave abnormality, consider lateral ischemia Abnormal ECG Confirmed by Julianne Rice [phone]) on 08/13/2017 9:03:49 AM   Radiology Ct Head Wo Contrast  Result Date: 08/12/2017 CLINICAL DATA:  Recent syncope and fall. Parietal region headache. Initial blurred vision. Initial encounter. EXAM: CT HEAD WITHOUT CONTRAST TECHNIQUE: Contiguous axial images were obtained from the base of the skull through the vertex without intravenous contrast. COMPARISON:  02/13/2014 FINDINGS: Brain: There is no evidence of acute infarct, intracranial hemorrhage, mass, midline shift, or extra-axial fluid collection. Mild cerebral atrophy is unchanged and most notable in the parietal regions. Periventricular and subcortical white matter hypodensities are stable to mildly progressive and nonspecific but compatible with mild chronic small vessel ischemic disease. Vascular: Calcified atherosclerosis at the skull base. No hyperdense vessel. Skull: No fracture or suspicious osseous lesion. Sinuses/Orbits: Partially visualized small left  maxillary sinus mucous retention cyst. No sinus fluid. Clear mastoid air cells. Unremarkable orbits. Other: None. IMPRESSION: 1. No evidence of acute intracranial abnormality. 2. Mild chronic small vessel ischemic disease. Electronically Signed   By: Logan Bores M.D.   On: 08/12/2017 17:29    Procedures Procedures (including critical care time)  Medications Ordered in ED Medications  albuterol (PROVENTIL HFA;VENTOLIN HFA) 108 (90 Base) MCG/ACT inhaler 2 puff (has no administration in time range)  atorvastatin (LIPITOR) tablet 20 mg (has no administration in time range)  busPIRone (BUSPAR) tablet 10 mg (has no administration in time range)  carvedilol (COREG) tablet 12.5 mg (has no administration in time range)  clonazePAM (KLONOPIN) tablet 0.5 mg (has no administration in time range)  sacubitril-valsartan (ENTRESTO) 49-51 mg per tablet (has no administration in time range)  spironolactone (ALDACTONE) tablet 12.5 mg (has no administration in time range)  aspirin EC tablet 81 mg (has no administration in time range)  nitroGLYCERIN (NITROSTAT) SL tablet 0.4 mg (has no administration in time range)  acetaminophen (TYLENOL) tablet 650 mg (has no administration in time range)  ondansetron (ZOFRAN) injection 4 mg (has no administration in time range)  insulin aspart (novoLOG) injection 0-9 Units (has no administration in time range)  insulin aspart (novoLOG) injection 0-5 Units (has no administration in time range)     Initial Impression / Assessment and Plan / ED Course  I have reviewed the triage vital signs and the nursing notes.  Pertinent labs & imaging results that were available during my care of the patient were reviewed by me and considered in my medical decision making (see chart for details).     Discussed with cardiology who will evaluate the patient in the emergency department.  Patient continues to be asymptomatic. Final Clinical Impressions(s) / ED Diagnoses   Final  diagnoses:  NSTEMI (non-ST elevated myocardial infarction) (  [facility])    ED Discharge Orders    None       Julianne Rice, MD 08/13/17 1054

## 2017-08-13 NOTE — Interval H&P Note (Signed)
Cath Lab Visit (complete for each Cath Lab visit)  Clinical Evaluation Leading to the Procedure:   ACS: Yes.    Non-ACS:    Anginal Classification: CCS II  Anti-ischemic medical therapy: Minimal Therapy (1 class of medications)  Non-Invasive Test Results: No non-invasive testing performed  Prior CABG: Previous CABG      History and Physical Interval Note:  08/13/2017 3:14 PM  Larry Rangel  has presented today for surgery, with the diagnosis of syncopy, nstemi  The various methods of treatment have been discussed with the patient and family. After consideration of risks, benefits and other options for treatment, the patient has consented to  Procedure(s): LEFT HEART CATH AND CORS/GRAFTS ANGIOGRAPHY (N/A) as a surgical intervention .  The patient's history has been reviewed, patient examined, no change in status, stable for surgery.  I have reviewed the patient's chart and labs.  Questions were answered to the patient's satisfaction.     Tonny Bollman

## 2017-08-13 NOTE — Progress Notes (Signed)
Discussed results of cath with Dr. Excell Seltzer (see full procedure note). Suspect there was an acute plaque rupture that occurred earlier this week, now with appearance of new stenosis with recanalization. High risk to attempt intervention. Will pursue medical management. Will start heparin this evening post cath, then if no issues with bleeding can restart rivaroxaban either 8/2 evening or 8/3 AM (appears that he takes in the morning). Given his acute NSTEMI will load with clopidogrel and continue for 12 mos.   Jodelle Red, MD, PhD [facility]  [address] [place], [place] [address] [phone]

## 2017-08-13 NOTE — Progress Notes (Deleted)
Received patient from ED, cath lab on phone for report. Will transfer to cath lab.

## 2017-08-13 NOTE — Progress Notes (Signed)
ER staff has been requested to interrogate the patient's [employer] ICD in setting of recent syncope.   Berton Bon, AGNP-C [facility] 08/13/2017  11:51 AM Pager: [phone]

## 2017-08-13 NOTE — ED Notes (Signed)
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 Attempted to interrogate without success. Device did not connect.
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
## 2017-08-13 NOTE — ED Notes (Signed)
Cardiology in room at this time.

## 2017-08-13 NOTE — H&P (Signed)
Cardiology Admission History and Physical:   Patient ID: Larry Rangel; MRN: [id number]; DOB: 12-19-1950   Admission date: 08/13/2017  Primary Care Provider: Mosie Lukes, MD Primary Cardiologist: Larry Axe, MD  Primary Electrophysiologist:  Dr. Caryl Rangel  Chief Complaint:  Syncope, abnormal labs  Patient Profile:   Larry Rangel is a 67 y.o. male with a history of chronic systolic and diastolic heart failure (LVEF 20-25%), ICM s/p ICD,  longstanding persistent atrial fibrillation,atrial flutters/p ablation, MIandCABG2009,3/5 grafts patent 2015,hypertension, hyperlipidemia, and diabetes who presented for evaluation of elevated troponin after syncope.  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 He was lifting a box and suddenly blacked out. He woke up the ground with his coworkers around him. He does not recall anything unusual prior to the event. He denies chest pain, shortness of breath or dizziness. He had felt well earlier that day and was eating and drinking normally. He felt well afterwards and since that time. He went to his PCP yesterday who ordered a troponin that came back at 108.58. The patient was instructed to report to the ED. Troponin here is 28.68. He continues to denies any chest discomfort or shortness of breath. He has had no orthopnea, PND or edema.   He smokes 6-7 cigarettes per day. He drinks a glass of wine every evening. He reports compliance with all of his meds. His last dose of Xarelto was yesterday morning. He weighs daily and reports that his wt has been stable with no recent fluctuations.    Past Medical History:  Diagnosis Date  . Abnormal liver function 08/23/2010  . AICD (automatic cardioverter/defibrillator) present   . Anxiety   . Arthritis    "head to toe" (01/29/2017)  . CAD  (coronary artery disease)    a. s/p CABG 2009. b. Cath 07/2013: 3/5 patent grafts (appear to have chronic occ grafts), mod diag/L PL branch, did not appear flow limiting, elevated filling pressures.  . CHF (congestive heart failure) ([place]) 07/30/2013  . Chicken pox as a child  . Chronic combined systolic and diastolic CHF (congestive heart failure) ([place])    a. Echo 6/14: Mild LVH, EF 30-35%, diffuse HK, MAC, mild BAE. b. Drop in EF to 20% by echo 07/2013.  Marland Kitchen Hyperlipidemia   . Hypertension   . LBBB (left bundle branch block)   . Low back pain   . Measles as a child  . Mumps as a child  . Myocardial infarction ([facility]) 2009  . Paroxysmal atrial flutter ([place]) 07-2012   a. s/p ablation by Dr Larry Rangel 08-06-2012. b. Recurrence 07/2013.   Marland Kitchen Preventative health care 12/10/2014  . Tobacco user   . Type II diabetes mellitus ([place])     Past Surgical History:  Procedure Laterality Date  . ATRIAL FLUTTER ABLATION N/A 08/06/2012   Procedure: ATRIAL FLUTTER ABLATION;  Surgeon: Larry Sprang, MD;  Location: [facility] CATH LAB;  Service: Cardiovascular;  Laterality: N/A;  . CARDIAC CATHETERIZATION  2009  . CARDIOVERSION N/A 07/15/2012   Procedure: CARDIOVERSION;  Surgeon: Larry Headings, MD;  Location: [facility] ENDOSCOPY;  Service: Cardiovascular;  Laterality: N/A;  . COLONOSCOPY    . CORONARY ARTERY BYPASS GRAFT  2009   x 5  . IMPLANTABLE CARDIOVERTER DEFIBRILLATOR IMPLANT N/A 11/10/2013   Procedure: SUB Q IMPLANTABLE CARDIOVERTER DEFIBRILLATOR IMPLANT;  Surgeon:  Larry Sprang, MD;  Location: [facility] CATH LAB;  Service: Cardiovascular;  Laterality: N/A;  . LEFT HEART CATHETERIZATION WITH CORONARY ANGIOGRAM N/A 08/01/2013   Procedure: LEFT HEART CATHETERIZATION WITH CORONARY ANGIOGRAM;  Surgeon: Larry Blanks, MD;  Location: [facility] CATH LAB;  Service: Cardiovascular;  Laterality: N/A;  . POCKET REVISION N/A 12/16/2013   Procedure: POCKET REVISION;  Surgeon: Larry Lance, MD;  Location: [facility] CATH LAB;  Service:  Cardiovascular;  Laterality: N/A;  . SHOULDER SURGERY Left    "sewed up; sports related"  . TEE WITHOUT CARDIOVERSION N/A 07/15/2012   Procedure: TRANSESOPHAGEAL ECHOCARDIOGRAM (TEE);  Surgeon: Larry Headings, MD;  Location: [place];  Service: Cardiovascular;  Laterality: N/A;  . TONSILLECTOMY       Medications Prior to Admission: Prior to Admission medications   Medication Sig Start Date End Date Taking? Authorizing Provider  albuterol (PROVENTIL HFA;VENTOLIN HFA) 108 (90 BASE) MCG/ACT inhaler Inhale 2 puffs into the lungs every 6 (six) hours as needed for wheezing or shortness of breath. 12/05/14   Larry Lukes, MD  atorvastatin (LIPITOR) 20 MG tablet TAKE 1 TABLET (20 MG TOTAL) BY MOUTH DAILY. 01/07/17   Larry Lukes, MD  busPIRone (BUSPAR) 10 MG tablet TAKE 1 TABLET BY MOUTH TWICE A DAY 07/31/17   Larry Lukes, MD  carvedilol (COREG) 12.5 MG tablet TAKE 1 TABLET BY MOUTH TWICE DAILY WITH A MEAL 12/08/16   Larry Sprang, MD  clonazePAM (KLONOPIN) 0.5 MG tablet TAKE 1 TABLET BY MOUTH THREE TIMES A DAY AS NEEDED 04/30/17   Larry Lukes, MD  furosemide (LASIX) 40 MG tablet Take 2 tablets (80 mg total) by mouth daily. Take 2 tabs daily for the next 2 days, then 1 tab daily. If weight up > 3lbs overnight>>2 tabs 06/03/17   Barrett, Larry Croon, PA-C  glipiZIDE (GLUCOTROL) 5 MG tablet TAKE 1/2 TABLET (2.5 MG TOTAL) BY MOUTH ONCE A DAY BEFORE BREAKFAST Patient taking differently: 2.5MG BY MOUTH ONCE DAILY BEFORE BREAKFAST 09/16/16   Larry Lukes, MD  metFORMIN (GLUCOPHAGE) 500 MG tablet TAKE 1 TABLET BY MOUTH TWICE DAILY WITH A MEAL 01/09/17   Larry Lukes, MD  rivaroxaban (XARELTO) 20 MG TABS tablet TAKE 1 TABLET BY MOUTH EVERY DAY WITH SUPPER 08/10/17   Larry Sprang, MD  sacubitril-valsartan (ENTRESTO) 49-51 MG Take 1 tablet by mouth 2 (two) times daily. 06/22/17   Larry Sprang, MD  spironolactone (ALDACTONE) 25 MG tablet Take 0.5 tablets (12.5 mg total) by mouth daily.  06/07/17 07/07/17  Barrett, Larry Croon, PA-C     Allergies:   No Known Allergies  Social History:   Social History   Socioeconomic History  . Marital status: Married    Spouse name: Not on file  . Number of children: Not on file  . Years of education: Not on file  . Highest education level: Not on file  Occupational History  . Not on file  Social Needs  . Financial resource strain: Not on file  . Food insecurity:    Worry: Not on file    Inability: Not on file  . Transportation needs:    Medical: Not on file    Non-medical: Not on file  Tobacco Use  . Smoking status: Former Smoker    Packs/day: 1.00    Years: 40.00    Pack years: 40.00    Types: Cigarettes    Last attempt to quit: 02/01/2011    Years since quitting: 6.5  .  Smokeless tobacco: Never Used  Substance and Sexual Activity  . Alcohol use: Yes    Alcohol/week: 7.2 oz    Types: 12 Glasses of wine per week  . Drug use: No  . Sexual activity: Yes    Birth control/protection: Coitus interruptus  Lifestyle  . Physical activity:    Days per week: Not on file    Minutes per session: Not on file  . Stress: Not on file  Relationships  . Social connections:    Talks on phone: Not on file    Gets together: Not on file    Attends religious service: Not on file    Active member of club or organization: Not on file    Attends meetings of clubs or organizations: Not on file    Relationship status: Not on file  . Intimate partner violence:    Fear of current or ex partner: Not on file    Emotionally abused: Not on file    Physically abused: Not on file    Forced sexual activity: Not on file  Other Topics Concern  . 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   He has been married four times previously, the first  For 4yr and has two children by the marriage which are grown, the second  time for 11 yrs and has 2 by that marriage.   Wife has custody in [place]   The third marriage was 4 1/2 yrs and most recent marriage was the past eight weeks and he is currently estranged from that person.    Family History:   The patient's family history includes Alzheimer's disease in his mother; Cancer in his father; Drug abuse in his daughter; Heart disease in his father; Heart failure (age of onset: 657 in his father; Hyperlipidemia in his father; Hypertension in his father; Leukemia in his brother. There is no history of Early death, Kidney disease, or Stroke.    ROS:  Please see the history of present illness.  All other ROS reviewed and negative.     Physical Exam/Data:   Vitals:   08/13/17 0825 08/13/17 0829 08/13/17 0908  BP: 103/72  105/73  Pulse: 63  62  Resp: 16  14  Temp: 97.9 F (36.6 C)    TempSrc: Oral    SpO2: 99%  97%  Weight:  180 lb (81.6 kg)   Height:  6' 2"  (1.88 m)    No intake or output data in the 24 hours ending 08/13/17 0950 Filed Weights   08/13/17 0829  Weight: 180 lb (81.6 kg)   Body mass index is 23.11 kg/m.  General:  Well nourished, well developed, in no acute distress HEENT: normal Lymph: no adenopathy Neck: no JVD Endocrine:  No thryomegaly Vascular: No carotid bruits; FA pulses 2+ bilaterally without bruits  Cardiac:  normal S1, S2; RRR; no murmur  Lungs:  clear to auscultation bilaterally, no wheezing, rhonchi or rales  Abd: soft, nontender, no hepatomegaly  Ext: no edema Musculoskeletal:  No deformities, BUE and BLE strength normal and equal Skin: warm and dry  Neuro:  CNs 2-12 intact, no focal abnormalities noted Psych:  Normal affect    EKG:  The ECG that was done was personally reviewed and demonstrates NSR 74 bpm with TWI in lateral leads- previously present in June  Relevant CV Studies:  Echocardiogram  06/02/17 Study Conclusions  - Left ventricle: The cavity size was moderately dilated. Wall   thickness was  normal. Systolic function was severely reduced. The   estimated ejection fraction was in the range of 25% to 30%.   Diffuse hypokinesis. The study is not technically sufficient to   allow evaluation of LV diastolic function. - Aortic valve: Trileaflet; mildly thickened, mildly calcified   leaflets. - Mitral valve: There was trivial regurgitation. - Left atrium: The atrium was moderately dilated.   LHC 08/01/2013 Impression: 1. Severe triple vessel CAD s/p 5V CABG with 3/5 patent bypass grafts. The graft to the left sided PL is occluded and the graft to the diagonal is occluded (both appear to be chronic occlusions) 2. Moderate disease in diagonal and left sided posterolateral branch, does not appear to be flow limiting.  3. Elevated filling pressures  Recommendations: Continue medical management of CAD. He appears to be volume overloaded. This is most likely contributing to his dyspnea and hypoxia. Continue diuresis with IV Lasix.   Laboratory Data:  Chemistry Recent Labs  Lab 08/12/17 1644 08/13/17 0836  NA 137 139  K 4.3 3.8  CL 101 103  CO2 28 25  GLUCOSE 74 112*  BUN 21 21  CREATININE 1.20 1.27*  CALCIUM 9.6 9.6  GFRNONAA  --  56*  GFRAA  --  >60  ANIONGAP  --  11    Recent Labs  Lab 08/12/17 1644  PROT 6.3  AST 90*  ALT 28  BILITOT 3.2*   Hematology Recent Labs  Lab 08/12/17 1644 08/13/17 0836  WBC 12.1* 9.6  RBC 4.73 4.70  HGB 15.0 14.6  HCT 44.1 45.7  MCV 93.2 97.2  MCH 31.7 31.1  MCHC 34.0 31.9  RDW 13.4 14.0  PLT 135* 124*   Cardiac Enzymes Recent Labs  Lab 08/12/17 1644  TROPONINI 108.58*    Recent Labs  Lab 08/13/17 0848  TROPIPOC 28.68*    BNPNo results for input(s): BNP, PROBNP in the last 168 hours.  DDimer No results for input(s): DDIMER in the last 168 hours.  Radiology/Studies:  Ct Head Wo Contrast  Result Date: 08/12/2017 CLINICAL DATA:  Recent syncope and fall. Parietal region headache. Initial blurred vision. Initial  encounter. EXAM: CT HEAD WITHOUT CONTRAST TECHNIQUE: Contiguous axial images were obtained from the base of the skull through the vertex without intravenous contrast. COMPARISON:  02/13/2014 FINDINGS: Brain: There is no evidence of acute infarct, intracranial hemorrhage, mass, midline shift, or extra-axial fluid collection. Mild cerebral atrophy is unchanged and most notable in the parietal regions. Periventricular and subcortical white matter hypodensities are stable to mildly progressive and nonspecific but compatible with mild chronic small vessel ischemic disease. Vascular: Calcified atherosclerosis at the skull base. No hyperdense vessel. Skull: No fracture or suspicious osseous lesion. Sinuses/Orbits: Partially visualized small left maxillary sinus mucous retention cyst. No sinus fluid. Clear mastoid air cells. Unremarkable orbits. Other: None. IMPRESSION: 1. No evidence of acute intracranial abnormality. 2. Mild chronic small vessel ischemic disease. Electronically Signed   By: Logan Bores M.D.   On: 08/12/2017 17:29    Assessment and Plan:   Elevated Troponin -Pt seen at primary care yesterday after syncope on Monday. No chest pain or shortness of breath now or surrounding syncope.  -Troponin from office was 108.58, troponin here this am 28.68 -No recent exertional symptoms.  -Pt with significant hx of CAD-  MIandCABG2009,3/5 grafts patent 2015 -Given significantly elevated troponin and significant CAD hx, despite lack of symptoms  except for syncope, possible silent MI, will take pt to the cath lab to evaluate his coronary arteries and grafts.  The patient understands that risks included but are not limited to stroke (1 in 1000), death (1 in 65), kidney failure [usually temporary] (1 in 500), bleeding (1 in 200), allergic reaction [possibly serious] (1 in 200).  -Monitor renal function post cath. Will give IV fluids 9m/hr precath.   CAD -S/P MIandCABG2009,3/5 grafts patent  2015 -continue BB, statin, no aspirin due to need for anticoagulation  Ischemic cardiomyopathy -EF 20-25% 06/02/17, EF 25% by echo in 09/2015, improved from 15% in 2015. Pt has ICD. -Pt takes lasix 40 mg at home as needed as well as Entresto, carvedilol and spironolactone. Will hold Entresto and diuretics for this am due to cath -Currently euvolemic  Tobacco use -Continues to smoke 6-7 cigarettes per day -Advise cessation   Diabetes -Will hold oral meds and use SSI while hospitalized  Atrial fib -Currenlty in sinus rhythm, Rate controlled on BB -Hold anticoagulation for cath and resume after cath.   Hyperlipidemia -01/30/2017: Cholesterol 89; HDL 32; LDL Cholesterol 40; Triglycerides 86; VLDL 17 -Continue statin    Severity of Illness: The appropriate patient status for this patient is OBSERVATION. Observation status is judged to be reasonable and necessary in order to provide the required intensity of service to ensure the patient's safety. The patient's presenting symptoms, physical exam findings, and initial radiographic and laboratory data in the context of their medical condition is felt to place them at decreased risk for further clinical deterioration. Furthermore, it is anticipated that the patient will be medically stable for discharge from the hospital within 2 midnights of admission. The following factors support the patient status of observation.   " The patient's presenting symptoms include syncope. " The physical exam findings are normal. " The initial radiographic and laboratory data are elevated troponin.     For questions or updates, please contact CM[place]owPlease consult [email or web address] for contact info under Cardiology/STEMI.    Signed, JDaune Perch NP  08/13/2017 9:50 AM

## 2017-08-13 NOTE — ED Triage Notes (Signed)
Pt was sent by Doctors office for elevated troponin. Pt was at work two days ago and passed out and went to PCP yesterday and they called informing patient that he has an elevated troponin. Pt is pain free and has no complaints

## 2017-08-13 NOTE — Progress Notes (Signed)
ANTICOAGULATION CONSULT NOTE - Initial Consult  Pharmacy Consult for heparin Indication: chest pain/ACS  No Known Allergies  Patient Measurements: Height: _0  (188 cm) Weight: 180 lb (81.6 kg) IBW/kg (Calculated) : 82.2 Heparin Dosing Weight: 81kg  Vital Signs: Temp: 98 F (36.7 C) (08/01 1620) Temp Source: Oral (08/01 1620) BP: 117/77 (08/01 1641) Pulse Rate: 56 (08/01 1700)  Labs: Recent Labs    08/12/17 1644 08/13/17 0836 08/13/17 1114  HGB 15.0 14.6  --   HCT 44.1 45.7  --   PLT 135* 124*  --   CREATININE 1.20 1.27*  --   TROPONINI 108.58*  --  35.31*    Estimated Creatinine Clearance: 63.4 mL/min (A) (by C-G formula based on SCr of 1.27 mg/dL (H)).   Medical History: Past Medical History:  Diagnosis Date  . Abnormal liver function 08/23/2010  . AICD (automatic cardioverter/defibrillator) present   . Anxiety   . Arthritis    "head to toe" (01/29/2017)  . CAD (coronary artery disease)    a. s/p CABG 2009. b. Cath 07/2013: 3/5 patent grafts (appear to have chronic occ grafts), mod diag/L PL branch, did not appear flow limiting, elevated filling pressures.  . CHF (congestive heart failure) ([place]) 07/30/2013  . Chicken pox as a child  . Chronic combined systolic and diastolic CHF (congestive heart failure) ([place])    a. Echo 6/14: Mild LVH, EF 30-35%, diffuse HK, MAC, mild BAE. b. Drop in EF to 20% by echo 07/2013.  Marland Kitchen Hyperlipidemia   . Hypertension   . LBBB (left bundle branch block)   . Low back pain   . Measles as a child  . Mumps as a child  . Myocardial infarction ([place]) 2009  . Paroxysmal atrial flutter ([place]) 07-2012   a. s/p ablation by Dr [name] 08-06-2012. b. Recurrence 07/2013.   Marland Kitchen Preventative health care 12/10/2014  . Tobacco user   . Type II diabetes mellitus (HCC)      Assessment: 57 yoM admitted with syncope and found to have elevated troponins now s/p LHC with severe 3vCAD. Pt has PMH AFib and is on Xarelto PTA (last dose 7/31 pm). Per MD,  planning medical management and will start IV heparin with no bolus tonight at 2100.  Goal of Therapy:  Heparin level 0.3-0.7 units/ml Monitor platelets by anticoagulation protocol: Yes   Plan:  -Start IV heparin 1000 units/hr with no bolus at 2100 -Check 6-hr aPTT -Monitor heparin level/aPTT, CBC, S/Sx bleeding daily  Arrie Senate, PharmD, BCPS Clinical Pharmacist [phone] Please check AMION for all [place] numbers 08/13/2017

## 2017-08-13 NOTE — Telephone Encounter (Signed)
Dr. Posey Rea,  Thanks for update. I did talk with him last night twice and again this morning. He finally did go to ED.  Appreciate your help,  Ramon Dredge

## 2017-08-14 ENCOUNTER — Encounter (HOSPITAL_COMMUNITY): Payer: Self-pay | Admitting: Cardiovascular Disease

## 2017-08-14 ENCOUNTER — Inpatient Hospital Stay (HOSPITAL_COMMUNITY): Payer: Medicare HMO

## 2017-08-14 DIAGNOSIS — I249 Acute ischemic heart disease, unspecified: Secondary | ICD-10-CM | POA: Diagnosis not present

## 2017-08-14 DIAGNOSIS — E119 Type 2 diabetes mellitus without complications: Secondary | ICD-10-CM | POA: Diagnosis present

## 2017-08-14 DIAGNOSIS — R0902 Hypoxemia: Secondary | ICD-10-CM | POA: Diagnosis present

## 2017-08-14 DIAGNOSIS — I10 Essential (primary) hypertension: Secondary | ICD-10-CM | POA: Diagnosis not present

## 2017-08-14 DIAGNOSIS — I480 Paroxysmal atrial fibrillation: Secondary | ICD-10-CM

## 2017-08-14 DIAGNOSIS — I110 Hypertensive heart disease with heart failure: Secondary | ICD-10-CM | POA: Diagnosis present

## 2017-08-14 DIAGNOSIS — Z8349 Family history of other endocrine, nutritional and metabolic diseases: Secondary | ICD-10-CM | POA: Diagnosis not present

## 2017-08-14 DIAGNOSIS — I5042 Chronic combined systolic (congestive) and diastolic (congestive) heart failure: Secondary | ICD-10-CM | POA: Diagnosis present

## 2017-08-14 DIAGNOSIS — I481 Persistent atrial fibrillation: Secondary | ICD-10-CM | POA: Diagnosis present

## 2017-08-14 DIAGNOSIS — Z951 Presence of aortocoronary bypass graft: Secondary | ICD-10-CM | POA: Diagnosis not present

## 2017-08-14 DIAGNOSIS — F17210 Nicotine dependence, cigarettes, uncomplicated: Secondary | ICD-10-CM | POA: Diagnosis present

## 2017-08-14 DIAGNOSIS — Z4502 Encounter for adjustment and management of automatic implantable cardiac defibrillator: Secondary | ICD-10-CM

## 2017-08-14 DIAGNOSIS — Z8249 Family history of ischemic heart disease and other diseases of the circulatory system: Secondary | ICD-10-CM | POA: Diagnosis not present

## 2017-08-14 DIAGNOSIS — Z95810 Presence of automatic (implantable) cardiac defibrillator: Secondary | ICD-10-CM | POA: Diagnosis not present

## 2017-08-14 DIAGNOSIS — I255 Ischemic cardiomyopathy: Secondary | ICD-10-CM | POA: Diagnosis present

## 2017-08-14 DIAGNOSIS — I214 Non-ST elevation (NSTEMI) myocardial infarction: Secondary | ICD-10-CM | POA: Diagnosis present

## 2017-08-14 DIAGNOSIS — I2510 Atherosclerotic heart disease of native coronary artery without angina pectoris: Secondary | ICD-10-CM | POA: Diagnosis present

## 2017-08-14 DIAGNOSIS — I252 Old myocardial infarction: Secondary | ICD-10-CM | POA: Diagnosis not present

## 2017-08-14 DIAGNOSIS — E785 Hyperlipidemia, unspecified: Secondary | ICD-10-CM

## 2017-08-14 DIAGNOSIS — I447 Left bundle-branch block, unspecified: Secondary | ICD-10-CM | POA: Diagnosis present

## 2017-08-14 DIAGNOSIS — Z7984 Long term (current) use of oral hypoglycemic drugs: Secondary | ICD-10-CM | POA: Diagnosis not present

## 2017-08-14 DIAGNOSIS — Z7901 Long term (current) use of anticoagulants: Secondary | ICD-10-CM | POA: Diagnosis not present

## 2017-08-14 DIAGNOSIS — Z806 Family history of leukemia: Secondary | ICD-10-CM | POA: Diagnosis not present

## 2017-08-14 LAB — GLUCOSE, CAPILLARY
GLUCOSE-CAPILLARY: 109 mg/dL — AB (ref 70–99)
GLUCOSE-CAPILLARY: 106 mg/dL — AB (ref 70–99)
Glucose-Capillary: 94 mg/dL (ref 70–99)
Glucose-Capillary: 115 mg/dL — ABNORMAL HIGH (ref 70–99)

## 2017-08-14 LAB — HEPARIN LEVEL (UNFRACTIONATED)
HEPARIN UNFRACTIONATED: 0.11 [IU]/mL — AB (ref 0.30–0.70)
Heparin Unfractionated: 0.13 IU/mL — ABNORMAL LOW (ref 0.30–0.70)
Heparin Unfractionated: 0.1 IU/mL — ABNORMAL LOW (ref 0.30–0.70)

## 2017-08-14 LAB — CBC
HEMATOCRIT: 44.6 % (ref 39.0–52.0)
Hemoglobin: 14.1 g/dL (ref 13.0–17.0)
MCH: 31.1 pg (ref 26.0–34.0)
MCHC: 31.6 g/dL (ref 30.0–36.0)
MCV: 98.2 fL (ref 78.0–100.0)
Platelets: 130 10*3/uL — ABNORMAL LOW (ref 150–400)
RBC: 4.54 MIL/uL (ref 4.22–5.81)
RDW: 14.1 % (ref 11.5–15.5)
WBC: 9 10*3/uL (ref 4.0–10.5)

## 2017-08-14 LAB — BASIC METABOLIC PANEL
Anion gap: 8 (ref 5–15)
BUN: 27 mg/dL — AB (ref 8–23)
CHLORIDE: 106 mmol/L (ref 98–111)
CO2: 27 mmol/L (ref 22–32)
Calcium: 9.4 mg/dL (ref 8.9–10.3)
Creatinine, Ser: 1.36 mg/dL — ABNORMAL HIGH (ref 0.61–1.24)
GFR calc Af Amer: 60 mL/min — ABNORMAL LOW (ref >60)
GFR calc non Af Amer: 52 mL/min — ABNORMAL LOW (ref >60)
GLUCOSE: 120 mg/dL — AB (ref 70–99)
POTASSIUM: 5 mmol/L (ref 3.5–5.1)
Sodium: 141 mmol/L (ref 135–145)

## 2017-08-14 LAB — HIV ANTIBODY (ROUTINE TESTING W REFLEX): HIV SCREEN 4TH GENERATION: NONREACTIVE

## 2017-08-14 LAB — APTT
aPTT: 32 seconds (ref 24–36)
aPTT: 39 seconds — ABNORMAL HIGH (ref 24–36)

## 2017-08-14 LAB — ECHOCARDIOGRAM COMPLETE
Height: 74 in
WEIGHTICAEL: 2839.52 [oz_av]

## 2017-08-14 MED ORDER — RIVAROXABAN 20 MG PO TABS: 20.0000 mg | ORAL_TABLET | Freq: Every day | ORAL | Status: DC

## 2017-08-14 MED ORDER — RIVAROXABAN 20 MG PO TABS
20.0000 mg | ORAL_TABLET | Freq: Once | ORAL | Status: AC
  Administered 2017-08-15: 20 mg via ORAL
  Filled 2017-08-14: qty 1

## 2017-08-14 MED ORDER — LOSARTAN POTASSIUM 50 MG PO TABS
50.0000 mg | ORAL_TABLET | Freq: Every day | ORAL | Status: DC
  Administered 2017-08-14 – 2017-08-15 (×2): 50 mg via ORAL
  Filled 2017-08-14 (×2): qty 1

## 2017-08-14 MED ORDER — ATORVASTATIN CALCIUM 80 MG PO TABS
80.0000 mg | ORAL_TABLET | Freq: Every day | ORAL | Status: DC
  Administered 2017-08-14: 80 mg via ORAL
  Filled 2017-08-14: qty 1

## 2017-08-14 NOTE — Progress Notes (Signed)
ANTICOAGULATION CONSULT NOTE   Pharmacy Consult for Heparin (Xarelto on hold) Indication: chest pain/ACS, afib  No Known Allergies  Patient Measurements: Height: _0  (188 cm) Weight: 177 lb 7.5 oz (80.5 kg) IBW/kg (Calculated) : 82.2 Heparin Dosing Weight: 81kg  Vital Signs: Temp: 97.9 F (36.6 C) (08/02 1940) Temp Source: Oral (08/02 1940) BP: 169/141 (08/02 1940) Pulse Rate: 74 (08/02 1940)  Labs: Recent Labs    08/12/17 1644 08/13/17 0836 08/13/17 1114 08/13/17 1624 08/13/17 2141 08/14/17 0357 08/14/17 1158 08/14/17 2003  HGB 15.0 14.6  --   --   --  14.1  --   --   HCT 44.1 45.7  --   --   --  44.6  --   --   PLT 135* 124*  --   --   --  130*  --   --   APTT  --   --   --   --   --  32 39*  --   HEPARINUNFRC  --   --   --   --   --  0.13* 0.10* 0.11*  CREATININE 1.20 1.27*  --   --   --  1.36*  --   --   TROPONINI 108.58*  --  35.31* 29.83* 30.65*  --   --   --     Estimated Creatinine Clearance: 58.4 mL/min (A) (by C-G formula based on SCr of 1.36 mg/dL (H)).   Medical History: Past Medical History:  Diagnosis Date  . Abnormal liver function 08/23/2010  . AICD (automatic cardioverter/defibrillator) present   . Anxiety   . Arthritis    "head to toe" (01/29/2017)  . CAD (coronary artery disease)    a. s/p CABG 2009. b. Cath 07/2013: 3/5 patent grafts (appear to have chronic occ grafts), mod diag/L PL branch, did not appear flow limiting, elevated filling pressures.  . CHF (congestive heart failure) ([place]) 07/30/2013  . Chicken pox as a child  . Chronic combined systolic and diastolic CHF (congestive heart failure) ([place])    a. Echo 6/14: Mild LVH, EF 30-35%, diffuse HK, MAC, mild BAE. b. Drop in EF to 20% by echo 07/2013.  Marland Kitchen Hyperlipidemia   . Hypertension   . LBBB (left bundle branch block)   . Low back pain   . Measles as a child  . Mumps as a child  . Myocardial infarction ([place]) 2009  . Paroxysmal atrial flutter ([place]) 07-2012   a. s/p ablation by Dr  [name] 08-06-2012. b. Recurrence 07/2013.   Marland Kitchen Preventative health care 12/10/2014  . Tobacco user   . Type II diabetes mellitus (HCC)      Assessment: 25 yoM admitted with syncope and found to have elevated troponins now s/p LHC with severe 3vCAD. Pt has PMH AFib and is on Xarelto PTA (last dose 7/31 pm).  Heparin level subtherapeutic at 0.11, however infusion was disconnected for unknown period of time earlier today. Heparin has been infusing with no issues for at least three hours per RN.  Goal of Therapy:  Heparin level 0.3-0.7 units/ml Monitor platelets by anticoagulation protocol: Yes   Plan:  -Increase heparin infusion slightly to 1350 units/hr -Recheck heparin level with am labs  Arrie Senate, PharmD, BCPS Clinical Pharmacist [phone] Please check AMION for all [place] numbers 08/14/2017

## 2017-08-14 NOTE — Progress Notes (Signed)
[place] for Heparin (Xarelto on hold) Indication: chest pain/ACS, afib  No Known Allergies  Patient Measurements: Height: 6' 2"  (188 cm) Weight: 180 lb (81.6 kg) IBW/kg (Calculated) : 82.2 Heparin Dosing Weight: 81kg  Vital Signs: Temp: 97.6 F (36.4 C) (08/02 0345) Temp Source: Oral (08/02 0345) BP: 132/88 (08/02 0345) Pulse Rate: 67 (08/02 0345)  Labs: Recent Labs    08/12/17 1644 08/13/17 0836 08/13/17 1114 08/13/17 1624 08/13/17 2141 08/14/17 0357  HGB 15.0 14.6  --   --   --  14.1  HCT 44.1 45.7  --   --   --  44.6  PLT 135* 124*  --   --   --  130*  APTT  --   --   --   --   --  32  HEPARINUNFRC  --   --   --   --   --  0.13*  CREATININE 1.20 1.27*  --   --   --  1.36*  TROPONINI 108.58*  --  35.31* 29.83* 30.65*  --     Estimated Creatinine Clearance: 59.2 mL/min (A) (by C-G formula based on SCr of 1.36 mg/dL (H)).   Medical History: Past Medical History:  Diagnosis Date  . Abnormal liver function 08/23/2010  . AICD (automatic cardioverter/defibrillator) present   . Anxiety   . Arthritis    "head to toe" (01/29/2017)  . CAD (coronary artery disease)    a. s/p CABG 2009. b. Cath 07/2013: 3/5 patent grafts (appear to have chronic occ grafts), mod diag/L PL branch, did not appear flow limiting, elevated filling pressures.  . CHF (congestive heart failure) ([place]) 07/30/2013  . Chicken pox as a child  . Chronic combined systolic and diastolic CHF (congestive heart failure) ([place])    a. Echo 6/14: Mild LVH, EF 30-35%, diffuse HK, MAC, mild BAE. b. Drop in EF to 20% by echo 07/2013.  Marland Kitchen Hyperlipidemia   . Hypertension   . LBBB (left bundle branch block)   . Low back pain   . Measles as a child  . Mumps as a child  . Myocardial infarction ([place]) 2009  . Paroxysmal atrial flutter ([place]) 07-2012   a. s/p ablation by Dr [name] 08-06-2012. b. Recurrence 07/2013.   Marland Kitchen Preventative health care 12/10/2014  . Tobacco user   . Type II  diabetes mellitus (HCC)      Assessment: 46 yoM admitted with syncope and found to have elevated troponins now s/p LHC with severe 3vCAD. Pt has PMH AFib and is on Xarelto PTA (last dose 7/31 pm). Per MD, planning medical management and will start IV heparin with no bolus tonight at 2100.  8/2 AM update: heparin off for a while this AM, it is now resumed at 1000 units/hr, will re-time heparin level  Goal of Therapy:  Heparin level 0.3-0.7 units/ml Monitor platelets by anticoagulation protocol: Yes   Plan:  -Heparin resumed at 1000 units/hr -Re-time heparin level for [place], PharmD, [place] Pharmacist Phone: [phone]

## 2017-08-14 NOTE — Progress Notes (Signed)
When RN went to check on pt and explain labs, RN noticed pt had disconnected from his IV, which was running heparin, and is not sure how long pt was disconnected. Pharmacy notified that heparin levels may be low due to IV being disconnected.

## 2017-08-14 NOTE — Progress Notes (Signed)
Walked into patient's room to find heparin drip disconnected from patient's IV line. Educated patient extensively about importance of medication and being compliant with treatment. Patient agreeable but does not seem to fully understand RN instructions. Will monitor closely.

## 2017-08-14 NOTE — Progress Notes (Signed)
[facility] for Heparin (Xarelto on hold) Indication: chest pain/ACS, afib  No Known Allergies  Patient Measurements: Height: _0  (188 cm) Weight: 177 lb 7.5 oz (80.5 kg) IBW/kg (Calculated) : 82.2 Heparin Dosing Weight: 81kg  Vital Signs: Temp: 97.7 F (36.5 C) (08/02 1136) Temp Source: Oral (08/02 1136) BP: 119/84 (08/02 1136) Pulse Rate: 79 (08/02 1136)  Labs: Recent Labs    08/12/17 1644 08/13/17 0836 08/13/17 1114 08/13/17 1624 08/13/17 2141 08/14/17 0357 08/14/17 1158  HGB 15.0 14.6  --   --   --  14.1  --   HCT 44.1 45.7  --   --   --  44.6  --   PLT 135* 124*  --   --   --  130*  --   APTT  --   --   --   --   --  32 39*  HEPARINUNFRC  --   --   --   --   --  0.13* 0.10*  CREATININE 1.20 1.27*  --   --   --  1.36*  --   TROPONINI 108.58*  --  35.31* 29.83* 30.65*  --   --     Estimated Creatinine Clearance: 58.4 mL/min (A) (by C-G formula based on SCr of 1.36 mg/dL (H)).   Medical History: Past Medical History:  Diagnosis Date  . Abnormal liver function 08/23/2010  . AICD (automatic cardioverter/defibrillator) present   . Anxiety   . Arthritis    "head to toe" (01/29/2017)  . CAD (coronary artery disease)    a. s/p CABG 2009. b. Cath 07/2013: 3/5 patent grafts (appear to have chronic occ grafts), mod diag/L PL branch, did not appear flow limiting, elevated filling pressures.  . CHF (congestive heart failure) ([place]) 07/30/2013  . Chicken pox as a child  . Chronic combined systolic and diastolic CHF (congestive heart failure) ([place])    a. Echo 6/14: Mild LVH, EF 30-35%, diffuse HK, MAC, mild BAE. b. Drop in EF to 20% by echo 07/2013.  Marland Kitchen Hyperlipidemia   . Hypertension   . LBBB (left bundle branch block)   . Low back pain   . Measles as a child  . Mumps as a child  . Myocardial infarction ([place]) 2009  . Paroxysmal atrial flutter ([place]) 07-2012   a. s/p ablation by Dr [name] 08-06-2012. b. Recurrence 07/2013.   Marland Kitchen  Preventative health care 12/10/2014  . Tobacco user   . Type II diabetes mellitus (H[facility])      Assessment: 43 yoM admitted with syncope and found to have elevated troponins now s/p LHC with severe 3vCAD. Pt has PMH AFib and is on Xarelto PTA (last dose 7/31 pm).  Heparin level (0.1) and aPTT (39) were both subtherapeutic, correlating so will just monitor heparin level. Hgb stable at 14.1, plt 130. No s/sx of bleeding. No infusion issues.    Goal of Therapy:  Heparin level 0.3-0.7 units/ml Monitor platelets by anticoagulation protocol: Yes   Plan:  -Increase heparin infusion to 1250 units/hr -Order heparin level in 6 hour  -Monitor daily HL, CBC, for s/sx of bleeding  Doylene Canard, PharmD Clinical Pharmacist  Pager: [phone] Phone: [phone]

## 2017-08-14 NOTE — Progress Notes (Signed)
  Echocardiogram 2D Echocardiogram has been performed.  Celene Skeen 08/14/2017, 2:40 PM

## 2017-08-14 NOTE — Progress Notes (Signed)
Progress Note  Patient Name: Larry Rangel Date of Encounter: 08/14/2017  Primary Cardiologist: Sherryl Manges, MD   Subjective   Patient with no acute events overnight. No chest pain or shortness of breath. No issues at radial site. Nursing reports that he disconnected his heparin overnight. It is running this morning. Full discussion summarized below.  Inpatient Medications    Scheduled Meds: . aspirin EC  81 mg Oral Daily  . atorvastatin  20 mg Oral q1800  . busPIRone  10 mg Oral BID  . carvedilol  12.5 mg Oral BID WC  . clopidogrel  75 mg Oral Daily  . insulin aspart  0-5 Units Subcutaneous QHS  . insulin aspart  0-9 Units Subcutaneous TID WC  . sodium chloride flush  3 mL Intravenous Q12H  . spironolactone  12.5 mg Oral Daily   Continuous Infusions: . sodium chloride    . heparin 1,000 Units/hr (08/14/17 0700)   PRN Meds: sodium chloride, acetaminophen, albuterol, clonazePAM, nitroGLYCERIN, ondansetron (ZOFRAN) IV, sodium chloride flush   Vital Signs    Vitals:   08/13/17 2314 08/14/17 0345 08/14/17 0529 08/14/17 0755  BP: 138/85 132/88  (!) 146/81  Pulse: 69 67  69  Resp: (!) 27 (!) 23  16  Temp: (!) 97.4 F (36.3 C) 97.6 F (36.4 C)  97.7 F (36.5 C)  TempSrc: Oral Oral  Oral  SpO2: 100% 99%  99%  Weight:   177 lb 7.5 oz (80.5 kg)   Height:        Intake/Output Summary (Last 24 hours) at 08/14/2017 1025 Last data filed at 08/14/2017 0849 Gross per 24 hour  Intake 600 ml  Output -  Net 600 ml   Filed Weights   08/13/17 0829 08/14/17 0529  Weight: 180 lb (81.6 kg) 177 lb 7.5 oz (80.5 kg)    Telemetry    Currently in coarse atrial fibrillation  Vs slow flutter - Personally Reviewed  ECG    Sinus rhythm with PACs and PVCs - Personally Reviewed  Physical Exam   GEN: No acute distress.   Neck: supple, no JVD Cardiac: regular S1 and S2, no murmurs, rubs, or gallops.  Respiratory: Clear to auscultation bilaterally. GI: Soft, nontender,  non-distended. Bowel sounds normal MS: No edema; No deformity. Left radial site c/d/i Neuro:  Nonfocal, moves all limbs independently Psych: Normal affect   Labs    Chemistry Recent Labs  Lab 08/12/17 1644 08/13/17 0836 08/14/17 0357  NA 137 139 141  K 4.3 3.8 5.0  CL 101 103 106  CO2 28 25 27   GLUCOSE 74 112* 120*  BUN 21 21 27*  CREATININE 1.20 1.27* 1.36*  CALCIUM 9.6 9.6 9.4  PROT 6.3  --   --   AST 90*  --   --   ALT 28  --   --   BILITOT 3.2*  --   --   GFRNONAA  --  56* 52*  GFRAA  --  >60 60*  ANIONGAP  --  11 8     Hematology Recent Labs  Lab 08/12/17 1644 08/13/17 0836 08/14/17 0357  WBC 12.1* 9.6 9.0  RBC 4.73 4.70 4.54  HGB 15.0 14.6 14.1  HCT 44.1 45.7 44.6  MCV 93.2 97.2 98.2  MCH 31.7 31.1 31.1  MCHC 34.0 31.9 31.6  RDW 13.4 14.0 14.1  PLT 135* 124* 130*    Cardiac Enzymes Recent Labs  Lab 08/12/17 1644 08/13/17 1114 08/13/17 1624 08/13/17 2141  TROPONINI 108.58*  35.31* 29.83* 30.65*    Recent Labs  Lab 08/13/17 0848  TROPIPOC 28.68*     BNPNo results for input(s): BNP, PROBNP in the last 168 hours.   DDimer No results for input(s): DDIMER in the last 168 hours.   Radiology    Ct Head Wo Contrast  Result Date: 08/13/2017 CLINICAL DATA:  Loss of consciousness 2 days ago. EXAM: CT HEAD WITHOUT CONTRAST TECHNIQUE: Contiguous axial images were obtained from the base of the skull through the vertex without intravenous contrast. COMPARISON:  CT scan of August 12, 2017. FINDINGS: Brain: Mild diffuse cortical atrophy is noted. Mild chronic ischemic white matter disease is noted. No mass effect or midline shift is noted. Ventricular size is within normal limits. There is no evidence of mass lesion, hemorrhage or acute infarction. Vascular: No hyperdense vessel or unexpected calcification. Skull: Normal. Negative for fracture or focal lesion. Sinuses/Orbits: No acute finding. Other: None. IMPRESSION: Mild diffuse cortical atrophy. Mild chronic  ischemic white matter disease. No acute intracranial abnormality seen. Electronically Signed   By: Lupita Raider, M.D.   On: 08/13/2017 10:54   Ct Head Wo Contrast  Result Date: 08/12/2017 CLINICAL DATA:  Recent syncope and fall. Parietal region headache. Initial blurred vision. Initial encounter. EXAM: CT HEAD WITHOUT CONTRAST TECHNIQUE: Contiguous axial images were obtained from the base of the skull through the vertex without intravenous contrast. COMPARISON:  02/13/2014 FINDINGS: Brain: There is no evidence of acute infarct, intracranial hemorrhage, mass, midline shift, or extra-axial fluid collection. Mild cerebral atrophy is unchanged and most notable in the parietal regions. Periventricular and subcortical white matter hypodensities are stable to mildly progressive and nonspecific but compatible with mild chronic small vessel ischemic disease. Vascular: Calcified atherosclerosis at the skull base. No hyperdense vessel. Skull: No fracture or suspicious osseous lesion. Sinuses/Orbits: Partially visualized small left maxillary sinus mucous retention cyst. No sinus fluid. Clear mastoid air cells. Unremarkable orbits. Other: None. IMPRESSION: 1. No evidence of acute intracranial abnormality. 2. Mild chronic small vessel ischemic disease. Electronically Signed   By: Sebastian Ache M.D.   On: 08/12/2017 17:29    Cardiac Studies   Cath 08/13/17  Mid RCA lesion is 100% stenosed.  LIMA.  RIMA.  Mid RCA to Dist RCA lesion is 50% stenosed.  SVG.  Seq SVG- OM1 and OM2.  2nd Mrg lesion is 95% stenosed.  Prox Cx to Mid Cx lesion is 75% stenosed.  Ost 1st Mrg lesion is 95% stenosed.  Prox LAD lesion is 80% stenosed.   1. Severe 3 vessel CAD with severe proximal LAD stenosis, total RCA occlusion, and severe LCx/OM stenosis 2. S/P CABG with continued patency of the LIMA-LAD, free RIMA-RCA, and SVG-OM1. Chronic occlusion of the OM2 limb and SVG-diagonal unchanged from the last cath study 3. Severe  de novo stenosis of the native OM1, likely culprit for large NSTEMI by enzymes  Favor medical therapy considering diffusely degenerated bypass graft leading into OM1 with high risk of complete closure with instrumentation. Suspect the OM1 has been occluded and recanalized based on angiographic appearance and large enzyme rise.   Echo ordered, pending  ICD interrogation:    Patient Profile     67 y.o. male PMH of CAD s/p 5V Cabg 2009 (3/5 grafts patent in 2015), chronic systolic and diastolic heart failure (LVEF 20-25%), ICM s/p ICD, persistent afib on rivaroxaban, aflutter s/p ablation, hypertension, hyperlipidemia who is seen after an episode of syncope on 7/29 and evaluation by PCP noted incidental troponin of >  100. Underwent left heart cath yesterday (see report), likely culprit in OM1 without active thrombus and location is high risk for intervention given graft location.  Assessment & Plan    NSTEMI, syncope: history of CABG 2009, HTN, HLD Likely NSTEMI as cause of his syncopal event on 7/29. Patient denies any chest pain, palpitations, or prodrome. Per his wife, did state that he had chest pain around the time of the event.  I reviewed the report from his ICD, which is currently in his chart. Snapshot uploaded here. It appears that he was in VT/VF and received an ICD shock at 7:20 PM on 7/29. Patient does not remember event at all. Likely that he was already unconscious when he received the shock.  Cath yesterday with likely culprit lesion, but high risk site given location relative to graft. Plan for medical management.  Started heparin last night, will continue through the day and restart rivaroxaban tomorrow. Patient wasthinking of leaving today, but after hearing that he received ICD shock, he seemed to understand the gravity of the situation.  Plan: -restart rivaroxaban tomorrow AM, continue heparin until that time. -continue triple therapy with aspirin and clopidogrel for one  month, then drop aspirin and continue clopidogrel with rivaroxaban. Loaded with 600 mg clopidogrel yesterday. Should be on clopidogrel for 12 mos given NSTEMI. -increase atorvastatin to 80 mg -ICD interrogated, VT/VF with shock as noted -continue carvedilol 12.5 BID -continue spironolactone 12.5 mg daily -prescribed entresto. Wasn't taking due to cost. Will restart valsartan today, will need to reassess entresto as an outpatient. -discussed cardiac rehab, he declined as he feels he is already very active at baseline. -discussed tobacco cessation. Precontemplative.  Chronic systolic and diastolic heart failure, ischemic cardiomyopathy:  -echo pending -has ICD -continue carvedilol as above -not taking entresto, start valsartan -appears euvolemic and Cr up slightly post cath, holding lasix  Atrial fibrillation, history of atrial flutter: Changed from sinus this AM -repeat EKG appears to be afib with LBBB -restart rivaroxaban tomorrow -rate controlled on carvedilol   Time Spent Directly with Patient: I have spent a total of 60 minutes with the patient reviewing hospital notes, telemetry, EKGs, labs and examining the patient as well as establishing an assessment and plan that was discussed personally with the patient.  > 50% of time was spent in direct patient care.  Length of Stay:  LOS: 0 days   Jodelle Red, MD, PhD [facility]   08/14/2017, 10:25 AM      For questions or updates, please contact [facility] Please consult [email or web address] for contact info under Cardiology/STEMI.

## 2017-08-14 NOTE — Progress Notes (Signed)
  Echocardiogram 2D Echocardiogram has been performed.  Larry Rangel 08/14/2017, 2:40 PM 

## 2017-08-15 LAB — CBC
HEMATOCRIT: 42.4 % (ref 39.0–52.0)
Hemoglobin: 13.7 g/dL (ref 13.0–17.0)
MCH: 31.4 pg (ref 26.0–34.0)
MCHC: 32.3 g/dL (ref 30.0–36.0)
MCV: 97.2 fL (ref 78.0–100.0)
PLATELETS: 128 10*3/uL — AB (ref 150–400)
RBC: 4.36 MIL/uL (ref 4.22–5.81)
RDW: 13.9 % (ref 11.5–15.5)
WBC: 7.2 10*3/uL (ref 4.0–10.5)

## 2017-08-15 LAB — BASIC METABOLIC PANEL
ANION GAP: 7 (ref 5–15)
BUN: 25 mg/dL — AB (ref 8–23)
CALCIUM: 9.1 mg/dL (ref 8.9–10.3)
CO2: 26 mmol/L (ref 22–32)
CREATININE: 1.22 mg/dL (ref 0.61–1.24)
Chloride: 106 mmol/L (ref 98–111)
GFR, EST NON AFRICAN AMERICAN: 59 mL/min — AB (ref >60)
Glucose, Bld: 109 mg/dL — ABNORMAL HIGH (ref 70–99)
Potassium: 4.3 mmol/L (ref 3.5–5.1)
Sodium: 139 mmol/L (ref 135–145)

## 2017-08-15 LAB — GLUCOSE, CAPILLARY: Glucose-Capillary: 107 mg/dL — ABNORMAL HIGH (ref 70–99)

## 2017-08-15 LAB — HEPARIN LEVEL (UNFRACTIONATED): HEPARIN UNFRACTIONATED: 0.19 [IU]/mL — AB (ref 0.30–0.70)

## 2017-08-15 MED ORDER — FUROSEMIDE 40 MG PO TABS: 40.0000 mg | ORAL_TABLET | Freq: Every day | ORAL | Status: DC

## 2017-08-15 MED ORDER — LOSARTAN POTASSIUM 50 MG PO TABS: 50.0000 mg | ORAL_TABLET | Freq: Every day | ORAL | 3 refills | Status: AC

## 2017-08-15 MED ORDER — METFORMIN HCL 500 MG PO TABS: 500.0000 mg | ORAL_TABLET | Freq: Two times a day (BID) | ORAL | 0 refills | Status: AC

## 2017-08-15 MED ORDER — ATORVASTATIN CALCIUM 80 MG PO TABS: 80.0000 mg | ORAL_TABLET | Freq: Every day | ORAL | 3 refills | Status: AC

## 2017-08-15 MED ORDER — NITROGLYCERIN 0.4 MG SL SUBL: 0.4000 mg | SUBLINGUAL_TABLET | SUBLINGUAL | 2 refills | Status: AC | PRN

## 2017-08-15 MED ORDER — ASPIRIN 81 MG PO TBEC: 81.0000 mg | DELAYED_RELEASE_TABLET | Freq: Every day | ORAL | Status: AC

## 2017-08-15 MED ORDER — CLOPIDOGREL BISULFATE 75 MG PO TABS: 75.0000 mg | ORAL_TABLET | Freq: Every day | ORAL | 3 refills | Status: AC

## 2017-08-15 NOTE — Discharge Summary (Signed)
Discharge Summary    Patient ID: Larry Rangel,  MRN: [id number], DOB/AGE: 1950/09/24 67 y.o.  Admit date: 08/13/2017 Discharge date: 08/15/2017  Primary Care Provider: Danise Edge A Primary Cardiologist: Sherryl Manges, MD   Discharge Diagnoses    Principal Problem:   NSTEMI (non-ST elevated myocardial infarction) [facility]) Active Problems:   Hyperlipidemia   Essential hypertension   Paroxysmal atrial fibrillation (HCC)   Ischemic cardiomyopathy   Acute on chronic combined systolic (congestive) and diastolic (congestive) heart failure (HCC)   Chronic combined systolic and diastolic heart failure (HCC)   S/P CABG (coronary artery bypass graft)   ACS (acute coronary syndrome) (HCC)   History of Present Illness     Larry Rangel is a 67 y.o. male with past medical history of chronic systolic and diastolic heart failure (LVEF 20-25%),ICM s/p ICD,longstanding persistent atrial fibrillation,atrial flutters/p ablation, CAD (s/p CABGin 2009 with 3/5 grafts patent by cath in 2015),hypertension, hyperlipidemia, and Type 2 DM who presented to [facility] ED on 08/13/2017 for evaluation of a syncopal event.  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 Reported going to see his PCP the day prior to admission and troponin was elevated to 108.58, therefore they advised he go to the ED for further evaluation. While in the ED, his repeat troponin was at 35.31. Given his presenting symptoms and elevated troponin values, he was admitted and started on Heparin with anticipation of a cardiac catheterization the following morning.    Hospital Course     Consultants: None   His catheterization on 08/13/2017 showed severe 3 vessel CAD with severe proximal LAD stenosis, total RCA occlusion, and severe LCx/OM stenosis. Did have continued patency of the LIMA-LAD, free RIMA-RCA, and SVG-OM1 with chronic occlusion of the OM2 limb and SVG-diagonal  unchanged from the last cath study. Was found to have severe de novo stenosis of the native OM1, thought to be the likely culprit for his NSTEMI. Medical therapy was favored given his diffusely degenerated bypass graft leading into OM1 with high risk of complete closure with instrumentation.   The following morning, he denied any recurrent chest pain or dyspnea. His ICD interrogation was reviewed and showed he was in VT/VF on 7/29 and received an ICD shock. He was continued on Heparin the following day with plans to restart Xarelto on 8/3. He will be on triple therapy with ASA, Plavix, and Xarelto for 1 month then plan to stop ASA and continue with Plavix and Xarelto for at least 12 months.   On 08/15/2017, he was evaluated by Dr. Eden Emms and reported overall doing well and was deemed stable for discharge. He will continue on Coreg, Losartan, and Spironolactone at the time of discharge with the possibility of switching to [facility] as an outpatient given his cardiomyopathy (previously self-discontinued due to cost). A staff message has been sent to the office to arrange for Cardiology follow-up.   _____________  Discharge Vitals Blood pressure 106/70, pulse 68, temperature 98 F (36.7 C), temperature source Oral, resp. rate (!) 23, height 6\' 2"  (1.88 m), weight 180 lb 1.9 oz (81.7 kg), SpO2 99 %.  Filed Weights   08/13/17 0829 08/14/17 0529 08/15/17 0440  Weight: 180 lb (81.6 kg) 177 lb 7.5 oz (80.5 kg) 180 lb 1.9 oz (81.7 kg)    Labs & Radiologic Studies     CBC Recent Labs    08/12/17 1644  08/14/17 0357 08/15/17 0340  WBC 12.1*   < >  9.0 7.2  NEUTROABS 10,418*  --   --   --   HGB 15.0   < > 14.1 13.7  HCT 44.1   < > 44.6 42.4  MCV 93.2   < > 98.2 97.2  PLT 135*   < > 130* 128*   < > = values in this interval not displayed.   Basic Metabolic Panel Recent Labs    31/51/76 0357 08/15/17 0340  NA 141 139  K 5.0 4.3  CL 106 106  CO2 27 26  GLUCOSE 120* 109*  BUN 27* 25*    CREATININE 1.36* 1.22  CALCIUM 9.4 9.1   Liver Function Tests Recent Labs    08/12/17 1644  AST 90*  ALT 28  BILITOT 3.2*  PROT 6.3   No results for input(s): LIPASE, AMYLASE in the last 72 hours. Cardiac Enzymes Recent Labs    08/13/17 1114 08/13/17 1624 08/13/17 2141  TROPONINI 35.31* 29.83* 30.65*   BNP Invalid input(s): POCBNP D-Dimer No results for input(s): DDIMER in the last 72 hours. Hemoglobin A1C No results for input(s): HGBA1C in the last 72 hours. Fasting Lipid Panel No results for input(s): CHOL, HDL, LDLCALC, TRIG, CHOLHDL, LDLDIRECT in the last 72 hours. Thyroid Function Tests No results for input(s): TSH, T4TOTAL, T3FREE, THYROIDAB in the last 72 hours.  Invalid input(s): FREET3  Ct Head Wo Contrast  Result Date: 08/13/2017 CLINICAL DATA:  Loss of consciousness 2 days ago. EXAM: CT HEAD WITHOUT CONTRAST TECHNIQUE: Contiguous axial images were obtained from the base of the skull through the vertex without intravenous contrast. COMPARISON:  CT scan of August 12, 2017. FINDINGS: Brain: Mild diffuse cortical atrophy is noted. Mild chronic ischemic white matter disease is noted. No mass effect or midline shift is noted. Ventricular size is within normal limits. There is no evidence of mass lesion, hemorrhage or acute infarction. Vascular: No hyperdense vessel or unexpected calcification. Skull: Normal. Negative for fracture or focal lesion. Sinuses/Orbits: No acute finding. Other: None. IMPRESSION: Mild diffuse cortical atrophy. Mild chronic ischemic white matter disease. No acute intracranial abnormality seen. Electronically Signed   By: Lupita Raider, M.D.   On: 08/13/2017 10:54   Ct Head Wo Contrast  Result Date: 08/12/2017 CLINICAL DATA:  Recent syncope and fall. Parietal region headache. Initial blurred vision. Initial encounter. EXAM: CT HEAD WITHOUT CONTRAST TECHNIQUE: Contiguous axial images were obtained from the base of the skull through the vertex  without intravenous contrast. COMPARISON:  02/13/2014 FINDINGS: Brain: There is no evidence of acute infarct, intracranial hemorrhage, mass, midline shift, or extra-axial fluid collection. Mild cerebral atrophy is unchanged and most notable in the parietal regions. Periventricular and subcortical white matter hypodensities are stable to mildly progressive and nonspecific but compatible with mild chronic small vessel ischemic disease. Vascular: Calcified atherosclerosis at the skull base. No hyperdense vessel. Skull: No fracture or suspicious osseous lesion. Sinuses/Orbits: Partially visualized small left maxillary sinus mucous retention cyst. No sinus fluid. Clear mastoid air cells. Unremarkable orbits. Other: None. IMPRESSION: 1. No evidence of acute intracranial abnormality. 2. Mild chronic small vessel ischemic disease. Electronically Signed   By: Sebastian Ache M.D.   On: 08/12/2017 17:29     Diagnostic Studies/Procedures     Cardiac Catheterization: 08/13/2017  Mid RCA lesion is 100% stenosed.  LIMA.  RIMA.  Mid RCA to Dist RCA lesion is 50% stenosed.  SVG.  Seq SVG- OM1 and OM2.  2nd Mrg lesion is 95% stenosed.  Prox Cx to Mid Cx lesion  is 75% stenosed.  Ost 1st Mrg lesion is 95% stenosed.  Prox LAD lesion is 80% stenosed.   1. Severe 3 vessel CAD with severe proximal LAD stenosis, total RCA occlusion, and severe LCx/OM stenosis 2. S/P CABG with continued patency of the LIMA-LAD, free RIMA-RCA, and SVG-OM1. Chronic occlusion of the OM2 limb and SVG-diagonal unchanged from the last cath study 3. 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 Suspect the OM1 has been occluded and recanalized based on angiographic appearance and large enzyme rise.   Echocardiogram: 08/14/2017 Study Conclusions  -  Left ventricle: The cavity size was severely dilated. There was   mild concentric hypertrophy. Systolic function was severely   reduced. The estimated ejection fraction was in the range of 20%   to 25%. Severe diffuse hypokinesis with distinct regional wall   motion abnormalities. There is akinesis of the inferior   myocardium. There is akinesis of the mid-apicalinferolateral   myocardium. - Mitral valve: Calcified annulus. There was mild regurgitation. - Left atrium: The atrium was moderately dilated. - Right ventricle: Systolic function was moderately reduced. - Tricuspid valve: There was mild regurgitation. - Pulmonary arteries: Systolic pressure could not be accurately   estimated.  Disposition   Pt is being discharged home today in good condition.  Follow-up Plans & Appointments    Follow-up Information    Duke Salvia, MD Follow up.   Specialty:  Cardiology Why:  A staff message has been sent to the office to arrange for Cardiology follow-up. If you do not hear from them in 2-3 business days, please call the number provided.  Contact information: [address] [place] [address] [phone]          Discharge Instructions    Diet - low sodium heart healthy   Complete by:  As directed    Discharge instructions   Complete by:  As directed    PLEASE REMEMBER TO BRING ALL OF YOUR MEDICATIONS TO EACH OF YOUR FOLLOW-UP OFFICE VISITS.  PLEASE ATTEND ALL SCHEDULED FOLLOW-UP APPOINTMENTS.   Activity: Increase activity slowly as tolerated. You may shower, but no soaking baths (or swimming) for 1 week. No driving for 24 hours. No lifting over 5 lbs for 1 week. No sexual activity for 1 week.   You May Return to Work: in 1 week (if applicable)  Wound Care: You may wash cath site gently with soap and water. Keep cath site clean and dry. If you notice pain, swelling, bleeding or pus at your cath site, please call [phone].   Increase activity slowly    Complete by:  As directed       Discharge Medications     Medication List    STOP taking these medications   sacubitril-valsartan 49-51 MG Commonly known as:  ENTRESTO     TAKE these medications   albuterol 108 (90 Base) MCG/ACT inhaler Commonly known as:  PROVENTIL HFA;VENTOLIN HFA Inhale 2 puffs into the lungs every 6 (six) hours as needed for wheezing or shortness of breath.   aspirin 81 MG EC tablet Take 1 tablet (81 mg total) by mouth daily. Stop taking on 09/13/2017. Start taking on:  08/16/2017   atorvastatin 80 MG tablet Commonly known as:  LIPITOR Take 1 tablet (80 mg total) by mouth daily at 6 PM. What changed:  medication strength  how much to take  how to take this  when to take this  additional instructions   busPIRone 10 MG tablet Commonly known as:  BUSPAR TAKE 1 TABLET BY MOUTH TWICE A DAY   carvedilol 12.5 MG tablet Commonly known as:  COREG TAKE 1 TABLET BY MOUTH TWICE DAILY WITH A MEAL   clonazePAM 0.5 MG tablet Commonly known as:  KLONOPIN TAKE 1 TABLET BY MOUTH THREE TIMES A DAY AS NEEDED   clopidogrel 75 MG tablet Commonly known as:  PLAVIX Take 1 tablet (75 mg total) by mouth daily. Start taking on:  08/16/2017   furosemide 40 MG tablet Commonly known as:  LASIX Take 1 tablet (40 mg total) by mouth daily.   glipiZIDE 5 MG tablet Commonly known as:  GLUCOTROL TAKE 1/2 TABLET (2.5 MG TOTAL) BY MOUTH ONCE A DAY BEFORE BREAKFAST What changed:  See the new instructions.   losartan 50 MG tablet Commonly known as:  COZAAR Take 1 tablet (50 mg total) by mouth daily. Start taking on:  08/16/2017   metFORMIN 500 MG tablet Commonly known as:  GLUCOPHAGE Take 1 tablet (500 mg total) by mouth 2 (two) times daily with a meal. Start taking on:  08/16/2017 What changed:  See the new instructions.   nitroGLYCERIN 0.4 MG SL tablet Commonly known as:  NITROSTAT Place 1 tablet (0.4 mg total) under the tongue every 5 (five) minutes x 3 doses  as needed for chest pain.   rivaroxaban 20 MG Tabs tablet Commonly known as:  XARELTO TAKE 1 TABLET BY MOUTH EVERY DAY WITH SUPPER What changed:    how much to take  how to take this  when to take this  additional instructions   spironolactone 25 MG tablet Commonly known as:  ALDACTONE Take 0.5 tablets (12.5 mg total) by mouth daily.         Allergies No Known Allergies   Acute coronary syndrome (MI, NSTEMI, STEMI, etc) this admission?: Yes.     AHA/ACC Clinical Performance & Quality Measures: 1. Aspirin prescribed? - Yes 2. ADP Receptor Inhibitor (Plavix/Clopidogrel, Brilinta/Ticagrelor or Effient/Prasugrel) prescribed (includes medically managed patients)? - Yes 3. Beta Blocker prescribed? - Yes 4. High Intensity Statin (Lipitor 40-80mg  or Crestor 20-40mg ) prescribed? - Yes 5. EF assessed during THIS hospitalization? - Yes 6. For EF <40%, was ACEI/ARB prescribed? - Yes 7. For EF <40%, Aldosterone Antagonist (Spironolactone or Eplerenone) prescribed? - Yes 8. Cardiac Rehab Phase II ordered (Included Medically managed Patients)? - Yes   Outstanding Labs/Studies   FLP and LFT's in 6-8 weeks.   Duration of Discharge Encounter   Greater than 30 minutes including physician time.  Signed, Ellsworth Lennox, PA-C 08/15/2017, 11:49 AM

## 2017-08-15 NOTE — Progress Notes (Signed)
Progress Note  Patient Name: DYMON DULIN Date of Encounter: 08/15/2017  Primary Cardiologist: Sherryl Manges, MD   Subjective   No chest pain No AICD shock No dyspnea Wants to go home   Inpatient Medications    Scheduled Meds: . aspirin EC  81 mg Oral Daily  . atorvastatin  80 mg Oral q1800  . busPIRone  10 mg Oral BID  . carvedilol  12.5 mg Oral BID WC  . clopidogrel  75 mg Oral Daily  . insulin aspart  0-5 Units Subcutaneous QHS  . insulin aspart  0-9 Units Subcutaneous TID WC  . losartan  50 mg Oral Daily  . rivaroxaban  20 mg Oral Once  . [START ON 08/16/2017] rivaroxaban  20 mg Oral Q supper  . sodium chloride flush  3 mL Intravenous Q12H  . spironolactone  12.5 mg Oral Daily   Continuous Infusions: . sodium chloride     PRN Meds: sodium chloride, acetaminophen, albuterol, clonazePAM, nitroGLYCERIN, ondansetron (ZOFRAN) IV, sodium chloride flush   Vital Signs    Vitals:   08/14/17 2315 08/15/17 0318 08/15/17 0440 08/15/17 0738  BP: 122/73 (!) 135/96  (!) 134/95  Pulse: 76 81  80  Resp: 17 (!) 22  16  Temp: 98.4 F (36.9 C) 98.2 F (36.8 C)  98.2 F (36.8 C)  TempSrc: Oral Oral  Oral  SpO2: 99% 99%    Weight:   180 lb 1.9 oz (81.7 kg)   Height:        Intake/Output Summary (Last 24 hours) at 08/15/2017 1051 Last data filed at 08/15/2017 0900 Gross per 24 hour  Intake 837.03 ml  Output -  Net 837.03 ml   Filed Weights   08/13/17 0829 08/14/17 0529 08/15/17 0440  Weight: 180 lb (81.6 kg) 177 lb 7.5 oz (80.5 kg) 180 lb 1.9 oz (81.7 kg)    Telemetry    Flutter rates 70-85 08/15/2017   ECG    NSR 08/15/2017 no VT   Physical Exam   Affect appropriate Thin white male  HEENT: normal Neck supple with no adenopathy JVP normal no bruits no thyromegaly Lungs clear with no wheezing and good diaphragmatic motion Heart:  S1/S2 no murmur, no rub, gallop or click PMI normal Abdomen: benighn, BS positve, no tenderness, no AAA no bruit.  No HSM or  HJR Distal pulses intact with no bruits No edema Neuro non-focal Skin warm and dry No muscular weakness [place] AICD pack on lateral side of left chest    Labs    Chemistry Recent Labs  Lab 08/12/17 1644 08/13/17 0836 08/14/17 0357 08/15/17 0340  NA 137 139 141 139  K 4.3 3.8 5.0 4.3  CL 101 103 106 106  CO2 28 25 27 26   GLUCOSE 74 112* 120* 109*  BUN 21 21 27* 25*  CREATININE 1.20 1.27* 1.36* 1.22  CALCIUM 9.6 9.6 9.4 9.1  PROT 6.3  --   --   --   AST 90*  --   --   --   ALT 28  --   --   --   BILITOT 3.2*  --   --   --   GFRNONAA  --  56* 52* 59*  GFRAA  --  >60 60* >60  ANIONGAP  --  11 8 7      Hematology Recent Labs  Lab 08/13/17 0836 08/14/17 0357 08/15/17 0340  WBC 9.6 9.0 7.2  RBC 4.70 4.54 4.36  HGB 14.6 14.1 13.7  HCT 45.7 44.6 42.4  MCV 97.2 98.2 97.2  MCH 31.1 31.1 31.4  MCHC 31.9 31.6 32.3  RDW 14.0 14.1 13.9  PLT 124* 130* 128*    Cardiac Enzymes Recent Labs  Lab 08/12/17 1644 08/13/17 1114 08/13/17 1624 08/13/17 2141  TROPONINI 108.58* 35.31* 29.83* 30.65*    Recent Labs  Lab 08/13/17 0848  TROPIPOC 28.68*     BNPNo results for input(s): BNP, PROBNP in the last 168 hours.   DDimer No results for input(s): DDIMER in the last 168 hours.   Radiology    No results found.  Cardiac Studies   Cath 08/13/17  Mid RCA lesion is 100% stenosed.  LIMA.  RIMA.  Mid RCA to Dist RCA lesion is 50% stenosed.  SVG.  Seq SVG- OM1 and OM2.  2nd Mrg lesion is 95% stenosed.  Prox Cx to Mid Cx lesion is 75% stenosed.  Ost 1st Mrg lesion is 95% stenosed.  Prox LAD lesion is 80% stenosed.   1. Severe 3 vessel CAD with severe proximal LAD stenosis, total RCA occlusion, and severe LCx/OM stenosis 2. S/P CABG with continued patency of the LIMA-LAD, free RIMA-RCA, and SVG-OM1. Chronic occlusion of the OM2 limb and SVG-diagonal unchanged from the last cath study 3. 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 Suspect the OM1 has been occluded and recanalized based on angiographic appearance and large enzyme rise.   Echo ordered, pending  ICD interrogation:    Patient Profile     67 y.o. male PMH of CAD s/p 5V Cabg 2009 (3/5 grafts patent in 2015), chronic systolic and diastolic heart failure (LVEF 20-25%), ICM s/p ICD, persistent afib on rivaroxaban, aflutter s/p ablation, hypertension, hyperlipidemia who is seen after an episode of syncope on 7/29 and evaluation by PCP noted incidental troponin of >100. Underwent left heart cath yesterday (see report), likely culprit in OM1 without active thrombus and location is high risk for intervention given graft location.  Assessment & Plan    NSTEMI, syncope: history of CABG 2009, HTN, HLD Cath with likely culprit OM medical Rx ASA/Plavix for a month then plavix only given need for anticoagulation  He is euvolemic EF severely reduced before event and now 20-25% continue aldactone and ARB consider transition To entresto as outpatient. AICD d/c appropriately normal function f/u Dr Graciela Husbands no need for amiodarone at this time PAF resume xarelto today rate control is fine ECG flutter rate 77   Charlton Haws

## 2017-08-18 ENCOUNTER — Telehealth: Payer: Self-pay

## 2017-08-18 NOTE — Telephone Encounter (Signed)
TCM follow up call made to patient. Answering machine came in  For the 3rd time.

## 2017-08-24 ENCOUNTER — Telehealth: Payer: Self-pay | Admitting: Cardiology

## 2017-08-24 ENCOUNTER — Encounter: Payer: Medicare HMO | Admitting: *Deleted

## 2017-08-24 NOTE — Telephone Encounter (Signed)
LMOVM reminding pt to send remote transmission.   

## 2017-08-26 ENCOUNTER — Encounter: Payer: Self-pay | Admitting: Cardiology

## 2017-09-01 ENCOUNTER — Encounter: Payer: Medicare HMO | Admitting: Internal Medicine

## 2017-09-01 DIAGNOSIS — R0989 Other specified symptoms and signs involving the circulatory and respiratory systems: Secondary | ICD-10-CM

## 2017-09-02 ENCOUNTER — Encounter: Payer: Self-pay | Admitting: Internal Medicine

## 2017-09-08 ENCOUNTER — Telehealth: Payer: Self-pay | Admitting: *Deleted

## 2017-09-08 ENCOUNTER — Inpatient Hospital Stay: Payer: Medicare HMO | Admitting: Family Medicine

## 2017-09-08 NOTE — Telephone Encounter (Signed)
Copied from CRM [id number]. Topic: Quick Communication - Appointment Cancellation >> Sep 08, 2017 10:25 AM Herby Abraham C wrote: Patient called to cancel appointment scheduled for TODAY. Patient has not rescheduled their appointment.--- pts spouse called in to cancel. pt has diarrhea and is nervous to leave the house  Route to department's PEC pool.

## 2017-09-08 NOTE — Telephone Encounter (Signed)
OK no charge ?

## 2017-10-03 ENCOUNTER — Other Ambulatory Visit: Payer: Self-pay | Admitting: Family Medicine

## 2017-10-12 ENCOUNTER — Other Ambulatory Visit: Payer: Self-pay | Admitting: Physician Assistant

## 2017-10-23 ENCOUNTER — Telehealth: Payer: Self-pay

## 2017-10-23 ENCOUNTER — Other Ambulatory Visit: Payer: Self-pay | Admitting: Family Medicine

## 2017-10-23 NOTE — Telephone Encounter (Signed)
Pt wife said pt passed out last night. She is curious if pt had another shock from his defibrillator. Device tech nurse stated that the pt should go to the hospital.

## 2017-10-26 ENCOUNTER — Ambulatory Visit (INDEPENDENT_AMBULATORY_CARE_PROVIDER_SITE_OTHER): Payer: 59 | Admitting: *Deleted

## 2017-10-26 DIAGNOSIS — I255 Ischemic cardiomyopathy: Secondary | ICD-10-CM | POA: Diagnosis not present

## 2017-10-27 NOTE — Progress Notes (Signed)
Remote ICD transmission.   

## 2017-10-29 ENCOUNTER — Encounter: Payer: Self-pay | Admitting: Cardiology

## 2017-11-18 LAB — CUP PACEART REMOTE DEVICE CHECK
Date Time Interrogation Session: 20191106134154
Implantable Lead Implant Date: 20151029
Implantable Lead Location: 753862
Implantable Lead Model: 3401
MDC IDC PG IMPLANT DT: 20151029
MDC IDC PG SERIAL: 108164

## 2017-11-27 ENCOUNTER — Other Ambulatory Visit: Payer: Self-pay | Admitting: Family Medicine

## 2017-11-30 ENCOUNTER — Other Ambulatory Visit: Payer: Self-pay | Admitting: Family Medicine

## 2017-12-01 ENCOUNTER — Ambulatory Visit (INDEPENDENT_AMBULATORY_CARE_PROVIDER_SITE_OTHER): Payer: Medicare HMO | Admitting: Family Medicine

## 2017-12-01 ENCOUNTER — Encounter: Payer: Self-pay | Admitting: Family Medicine

## 2017-12-01 VITALS — BP 112/74 | HR 56 | Temp 97.5°F | Resp 18 | Ht 74.0 in | Wt 189.0 lb

## 2017-12-01 DIAGNOSIS — E785 Hyperlipidemia, unspecified: Secondary | ICD-10-CM

## 2017-12-01 DIAGNOSIS — E1169 Type 2 diabetes mellitus with other specified complication: Secondary | ICD-10-CM

## 2017-12-01 DIAGNOSIS — I10 Essential (primary) hypertension: Secondary | ICD-10-CM

## 2017-12-01 DIAGNOSIS — E669 Obesity, unspecified: Secondary | ICD-10-CM

## 2017-12-01 DIAGNOSIS — Z0000 Encounter for general adult medical examination without abnormal findings: Secondary | ICD-10-CM | POA: Diagnosis not present

## 2017-12-01 LAB — COMPREHENSIVE METABOLIC PANEL
ALK PHOS: 59 U/L (ref 39–117)
ALT: 11 U/L (ref 0–53)
AST: 17 U/L (ref 0–37)
Albumin: 3.9 g/dL (ref 3.5–5.2)
BUN: 17 mg/dL (ref 6–23)
CO2: 28 meq/L (ref 19–32)
Calcium: 10 mg/dL (ref 8.4–10.5)
Chloride: 104 mEq/L (ref 96–112)
Creatinine, Ser: 1.31 mg/dL (ref 0.40–1.50)
GFR: 57.57 mL/min — AB (ref >60.00)
GLUCOSE: 92 mg/dL (ref 70–99)
POTASSIUM: 4 meq/L (ref 3.5–5.1)
Sodium: 142 mEq/L (ref 135–145)
TOTAL PROTEIN: 6.4 g/dL (ref 6.0–8.3)
Total Bilirubin: 1.4 mg/dL — ABNORMAL HIGH (ref 0.2–1.2)

## 2017-12-01 LAB — CBC
HEMATOCRIT: 45.1 % (ref 39.0–52.0)
Hemoglobin: 15 g/dL (ref 13.0–17.0)
MCHC: 33.3 g/dL (ref 30.0–36.0)
MCV: 102.1 fl — ABNORMAL HIGH (ref 78.0–100.0)
Platelets: 148 10*3/uL — ABNORMAL LOW (ref 150.0–400.0)
RBC: 4.42 Mil/uL (ref 4.22–5.81)
RDW: 16.1 % — AB (ref 11.5–15.5)
WBC: 8 10*3/uL (ref 4.0–10.5)

## 2017-12-01 LAB — TSH: TSH: 0.9 u[IU]/mL (ref 0.35–4.50)

## 2017-12-01 LAB — LIPID PANEL
CHOL/HDL RATIO: 4
Cholesterol: 135 mg/dL (ref 0–200)
HDL: 37.1 mg/dL — AB (ref >39.00)
LDL Cholesterol: 81 mg/dL (ref 0–99)
NONHDL: 98.2
Triglycerides: 88 mg/dL (ref 0.0–149.0)
VLDL: 17.6 mg/dL (ref 0.0–40.0)

## 2017-12-01 LAB — HEMOGLOBIN A1C: Hgb A1c MFr Bld: 5.1 % (ref 4.6–6.5)

## 2017-12-01 NOTE — Progress Notes (Signed)
Subjective:    Patient ID: Larry Rangel, male    DOB: 06-Apr-1950, 67 y.o.   MRN: [id number]  Chief Complaint  Patient presents with  . Annual Exam    HPI Patient is in today for annual preventative exam and follow up on chronic medical concerns including hypertension, hyperlipidemia, CAD and DM. He feels well. No recent febrile illness or hospitalizations. He is trying to maintain  A heart healthy diet and stay active. Denies polyuria or polydipsa, no acute concerns. Denies CP/palp/SOB/HA/congestion/fevers/GI or GU c/o. Taking meds as prescribed  Past Medical History:  Diagnosis Date  . Abnormal liver function 08/23/2010  . AICD (automatic cardioverter/defibrillator) present   . Anxiety   . Arthritis    "head to toe" (01/29/2017)  . CAD (coronary artery disease)    a. s/p CABG 2009. b. Cath 07/2013: 3/5 patent grafts (appear to have chronic occ grafts), mod diag/L PL branch, did not appear flow limiting, elevated filling pressures.  . CHF (congestive heart failure) ([place]) 07/30/2013  . Chicken pox as a child  . Chronic combined systolic and diastolic CHF (congestive heart failure) ([place])    a. Echo 6/14: Mild LVH, EF 30-35%, diffuse HK, MAC, mild BAE. b. Drop in EF to 20% by echo 07/2013.  Marland Kitchen Hyperlipidemia   . Hypertension   . LBBB (left bundle branch block)   . Low back pain   . Measles as a child  . Mumps as a child  . Myocardial infarction ([place]) 2009  . Paroxysmal atrial flutter ([place]) 07-2012   a. s/p ablation by Dr [name] 08-06-2012. b. Recurrence 07/2013.   Marland Kitchen Preventative health care 12/10/2014  . Tobacco user   . Type II diabetes mellitus ([place])     Past Surgical History:  Procedure Laterality Date  . ATRIAL FLUTTER ABLATION N/A 08/06/2012   Procedure: ATRIAL FLUTTER ABLATION;  Surgeon: Deboraha Sprang, MD;  Location: [facility] CATH LAB;  Service: Cardiovascular;  Laterality: N/A;  . CARDIAC CATHETERIZATION  2009  . CARDIOVERSION N/A 07/15/2012   Procedure: CARDIOVERSION;  Surgeon:  Thayer Headings, MD;  Location: [facility] ENDOSCOPY;  Service: Cardiovascular;  Laterality: N/A;  . COLONOSCOPY    . CORONARY ARTERY BYPASS GRAFT  2009   x 5  . IMPLANTABLE CARDIOVERTER DEFIBRILLATOR IMPLANT N/A 11/10/2013   Procedure: SUB Q IMPLANTABLE CARDIOVERTER DEFIBRILLATOR IMPLANT;  Surgeon: Deboraha Sprang, MD;  Location: [facility] CATH LAB;  Service: Cardiovascular;  Laterality: N/A;  . LEFT HEART CATH AND CORS/GRAFTS ANGIOGRAPHY N/A 08/13/2017   Procedure: LEFT HEART CATH AND CORS/GRAFTS ANGIOGRAPHY;  Surgeon: Sherren Mocha, MD;  Location: [facility] CV LAB;  Service: Cardiovascular;  Laterality: N/A;  . LEFT HEART CATHETERIZATION WITH CORONARY ANGIOGRAM N/A 08/01/2013   Procedure: LEFT HEART CATHETERIZATION WITH CORONARY ANGIOGRAM;  Surgeon: Burnell Blanks, MD;  Location: [facility] CATH LAB;  Service: Cardiovascular;  Laterality: N/A;  . POCKET REVISION N/A 12/16/2013   Procedure: POCKET REVISION;  Surgeon: Evans Lance, MD;  Location: [facility] CATH LAB;  Service: Cardiovascular;  Laterality: N/A;  . SHOULDER SURGERY Left    "sewed up; sports related"  . TEE WITHOUT CARDIOVERSION N/A 07/15/2012   Procedure: TRANSESOPHAGEAL ECHOCARDIOGRAM (TEE);  Surgeon: Thayer Headings, MD;  Location: [facility] ENDOSCOPY;  Service: Cardiovascular;  Laterality: N/A;  . TONSILLECTOMY      Family History  Problem Relation Age of Onset  . Alzheimer's disease Mother   . Heart failure Father 25  . Hypertension Father   . Hyperlipidemia Father   .  Cancer Father        lung- took half of a young- smoker  . Heart disease Father        MI at 50  . Leukemia Brother   . Drug abuse Daughter        heroine  . Early death Neg Hx   . Kidney disease Neg Hx   . Stroke Neg Hx     Social History   Socioeconomic History  . Marital status: Married    Spouse name: Not on file  . Number of children: Not on file  . Years of education: Not on file  . Highest education level: Not on file  Occupational History  . Not on file    Social Needs  . Financial resource strain: Not on file  . Food insecurity:    Worry: Not on file    Inability: Not on file  . Transportation needs:    Medical: Not on file    Non-medical: Not on file  Tobacco Use  . Smoking status: Former Smoker    Packs/day: 1.00    Years: 40.00    Pack years: 40.00    Types: Cigarettes    Last attempt to quit: 02/01/2011    Years since quitting: 6.8  . Smokeless tobacco: Never Used  Substance and Sexual Activity  . Alcohol use: Yes    Alcohol/week: 12.0 standard drinks    Types: 12 Glasses of wine per week  . Drug use: No  . Sexual activity: Yes    Birth control/protection: Coitus interruptus  Lifestyle  . Physical activity:    Days per week: Not on file    Minutes per session: Not on file  . Stress: Not on file  Relationships  . Social connections:    Talks on phone: Not on file    Gets together: Not on file    Attends religious service: Not on file    Active member of club or organization: Not on file    Attends meetings of clubs or organizations: Not on file    Relationship status: Not on file  . Intimate partner violence:    Fear of current or ex partner: Not on file    Emotionally abused: Not on file    Physically abused: Not on file    Forced sexual activity: Not on file  Other Topics Concern  . 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   He has been married four times previously, the first  For 27yr and has two children by the marriage which are grown, the second time for 11 yrs and has 2 by that marriage.   Wife has custody in [place]   The third marriage was 4 1/2 yrs and most recent marriage was the past eight weeks and he is currently estranged from that person.    Outpatient Medications Prior to Visit  Medication Sig Dispense Refill  . albuterol (PROVENTIL HFA;VENTOLIN HFA)  108 (90 BASE) MCG/ACT inhaler Inhale 2 puffs into the lungs every 6 (six) hours as needed for wheezing or shortness of breath. 1 Inhaler 2  . aspirin EC 81 MG EC tablet Take 1 tablet (81 mg total) by mouth daily. Stop taking on 09/13/2017.    .Marland Kitchenatorvastatin (LIPITOR) 80 MG tablet Take 1 tablet (80 mg total) by mouth daily at 6 PM. 90  tablet 3  . busPIRone (BUSPAR) 10 MG tablet TAKE 1 TABLET BY MOUTH TWICE A DAY 180 tablet 1  . carvedilol (COREG) 12.5 MG tablet TAKE 1 TABLET BY MOUTH TWICE DAILY WITH A MEAL 60 tablet 7  . clonazePAM (KLONOPIN) 0.5 MG tablet TAKE 1 TABLET BY MOUTH THREE TIMES A DAY AS NEEDED 60 tablet 0  . clopidogrel (PLAVIX) 75 MG tablet Take 1 tablet (75 mg total) by mouth daily. 90 tablet 3  . furosemide (LASIX) 40 MG tablet Take 1 tablet (40 mg total) by mouth daily.    Marland Kitchen glipiZIDE (GLUCOTROL) 5 MG tablet Take 0.5 tablets (2.5 mg total) by mouth daily before breakfast. 45 tablet 1  . losartan (COZAAR) 50 MG tablet Take 1 tablet (50 mg total) by mouth daily. 90 tablet 3  . metFORMIN (GLUCOPHAGE) 500 MG tablet Take 1 tablet (500 mg total) by mouth 2 (two) times daily with a meal. 60 tablet 0  . metFORMIN (GLUCOPHAGE) 500 MG tablet TAKE 1 TABLET BY MOUTH TWICE DAILY WITH A MEAL 180 tablet 1  . nitroGLYCERIN (NITROSTAT) 0.4 MG SL tablet Place 1 tablet (0.4 mg total) under the tongue every 5 (five) minutes x 3 doses as needed for chest pain. 25 tablet 2  . rivaroxaban (XARELTO) 20 MG TABS tablet TAKE 1 TABLET BY MOUTH EVERY DAY WITH SUPPER (Patient taking differently: Take 20 mg by mouth daily with supper. ) 30 tablet 6  . spironolactone (ALDACTONE) 25 MG tablet TAKE 1/2 TABLET BY MOUTH DAILY 15 tablet 8   No facility-administered medications prior to visit.     No Known Allergies  Review of Systems  Constitutional: Negative for fever and malaise/fatigue.  HENT: Negative for congestion and hearing loss.   Eyes: Negative for blurred vision.  Respiratory: Negative for shortness of  breath.   Cardiovascular: Negative for chest pain, palpitations and leg swelling.  Gastrointestinal: Negative for abdominal pain, blood in stool and nausea.  Genitourinary: Negative for dysuria and frequency.  Musculoskeletal: Negative for back pain, falls and myalgias.  Skin: Negative for rash.  Neurological: Positive for weakness. Negative for dizziness, loss of consciousness and headaches.  Endo/Heme/Allergies: Negative for environmental allergies.  Psychiatric/Behavioral: Negative for depression. The patient is not nervous/anxious and does not have insomnia.        Objective:    Physical Exam  Constitutional: He is oriented to person, place, and time. He appears well-developed and well-nourished. No distress.  HENT:  Head: Normocephalic and atraumatic.  Right Ear: External ear normal.  Left Ear: External ear normal.  Mouth/Throat: Oropharynx is clear and moist.  Eyes: Pupils are equal, round, and reactive to light. Right eye exhibits no discharge. Left eye exhibits no discharge.  Neck: Normal range of motion. Neck supple.  Cardiovascular: Normal rate and regular rhythm.  No murmur heard. Pulmonary/Chest: Effort normal and breath sounds normal.  Abdominal: Soft. Bowel sounds are normal. He exhibits no mass. There is no tenderness. There is no guarding.  Musculoskeletal: He exhibits no edema.  Neurological: He is alert and oriented to person, place, and time. He displays normal reflexes. No cranial nerve deficit. Coordination normal.  Skin: Skin is warm and dry.  Psychiatric: He has a normal mood and affect.  Nursing note and vitals reviewed.   BP 112/74 (BP Location: Left Arm, Patient Position: Sitting, Cuff Size: Normal)   Pulse (!) 56   Temp (!) 97.5 F (36.4 C)   Resp 18   Ht 6' 2"  (1.88 m)  Wt 189 lb (85.7 kg)   SpO2 98%   BMI 24.27 kg/m  Wt Readings from Last 3 Encounters:  12/01/17 189 lb (85.7 kg)  08/15/17 180 lb 1.9 oz (81.7 kg)  08/12/17 180 lb (81.6 kg)       Lab Results  Component Value Date   WBC 7.2 08/15/2017   HGB 13.7 08/15/2017   HCT 42.4 08/15/2017   PLT 128 (L) 08/15/2017   GLUCOSE 109 (H) 08/15/2017   CHOL 89 01/30/2017   TRIG 86 01/30/2017   HDL 32 (L) 01/30/2017   LDLDIRECT 62.0 12/05/2014   LDLCALC 40 01/30/2017   ALT 28 08/12/2017   AST 90 (H) 08/12/2017   NA 139 08/15/2017   K 4.3 08/15/2017   CL 106 08/15/2017   CREATININE 1.22 08/15/2017   BUN 25 (H) 08/15/2017   CO2 26 08/15/2017   TSH 0.855 01/29/2017   PSA 1.86 04/16/2012   INR 2.88 (H) 02/14/2014   HGBA1C 4.8 01/29/2017   MICROALBUR 4.5 (H) 07/03/2015    Lab Results  Component Value Date   TSH 0.855 01/29/2017   Lab Results  Component Value Date   WBC 7.2 08/15/2017   HGB 13.7 08/15/2017   HCT 42.4 08/15/2017   MCV 97.2 08/15/2017   PLT 128 (L) 08/15/2017   Lab Results  Component Value Date   NA 139 08/15/2017   K 4.3 08/15/2017   CO2 26 08/15/2017   GLUCOSE 109 (H) 08/15/2017   BUN 25 (H) 08/15/2017   CREATININE 1.22 08/15/2017   BILITOT 3.2 (H) 08/12/2017   ALKPHOS 62 01/28/2017   AST 90 (H) 08/12/2017   ALT 28 08/12/2017   PROT 6.3 08/12/2017   ALBUMIN 3.8 01/28/2017   CALCIUM 9.1 08/15/2017   ANIONGAP 7 08/15/2017   GFR 87.87 01/28/2017   Lab Results  Component Value Date   CHOL 89 01/30/2017   Lab Results  Component Value Date   HDL 32 (L) 01/30/2017   Lab Results  Component Value Date   LDLCALC 40 01/30/2017   Lab Results  Component Value Date   TRIG 86 01/30/2017   Lab Results  Component Value Date   CHOLHDL 2.8 01/30/2017   Lab Results  Component Value Date   HGBA1C 4.8 01/29/2017       Assessment & Plan:   Problem List Items Addressed This Visit    Hyperlipidemia - Primary   Relevant Orders   Lipid panel   Essential hypertension    Well controlled, no changes to meds. Encouraged heart healthy diet such as the DASH diet and exercise as tolerated.       Relevant Orders   CBC   Comprehensive  metabolic panel   Lipid panel   TSH   Diabetes mellitus type 2 in obese (HCC)    hgba1c acceptable, minimize simple carbs. Increase exercise as tolerated. Continue current meds       Relevant Orders   Hemoglobin A1c   Lipid panel   Preventative health care    Patient encouraged to maintain heart healthy diet, regular exercise, adequate sleep. Consider daily probiotics. Take medications as prescribed. Declines flu shot, pneumonia shots and shingles shots. Cologuard is agreed to. Declined PSA         I am having Broderick L. Zalewski maintain his albuterol, carvedilol, clonazePAM, busPIRone, rivaroxaban, aspirin, atorvastatin, clopidogrel, metFORMIN, losartan, nitroGLYCERIN, furosemide, glipiZIDE, spironolactone, and metFORMIN.  No orders of the defined types were placed in this encounter.    Penni Homans, MD

## 2017-12-01 NOTE — Assessment & Plan Note (Signed)
hgba1c acceptable, minimize simple carbs. Increase exercise as tolerated. Continue current meds 

## 2017-12-01 NOTE — Assessment & Plan Note (Signed)
Well controlled, no changes to meds. Encouraged heart healthy diet such as the DASH diet and exercise as tolerated.  °

## 2017-12-01 NOTE — Patient Instructions (Signed)
Shingrix is the new shingles shot, 2 shots over 2-6 months at [place] 69 Years and Older, Male Preventive care refers to lifestyle choices and visits with your health care provider that can promote health and wellness. What does preventive care include?  A yearly physical exam. This is also called an annual well check.  Dental exams once or twice a year.  Routine eye exams. Ask your health care provider how often you should have your eyes checked.  Personal lifestyle choices, including: ? Daily care of your teeth and gums. ? Regular physical activity. ? Eating a healthy diet. ? Avoiding tobacco and drug use. ? Limiting alcohol use. ? Practicing safe sex. ? Taking low doses of aspirin every day. ? Taking vitamin and mineral supplements as recommended by your health care provider. What happens during an annual well check? The services and screenings done by your health care provider during your annual well check will depend on your age, overall health, lifestyle risk factors, and family history of disease. Counseling Your health care provider may ask you questions about your:  Alcohol use.  Tobacco use.  Drug use.  Emotional well-being.  Home and relationship well-being.  Sexual activity.  Eating habits.  History of falls.  Memory and ability to understand (cognition).  Work and work Statistician.  Screening You may have the following tests or measurements:  Height, weight, and BMI.  Blood pressure.  Lipid and cholesterol levels. These may be checked every 5 years, or more frequently if you are over 69 years old.  Skin check.  Lung cancer screening. You may have this screening every year starting at age 69 if you have a 30-pack-year history of smoking and currently smoke or have quit within the past 15 years.  Fecal occult blood test (FOBT) of the stool. You may have this test every year starting at age 69.  Flexible sigmoidoscopy or  colonoscopy. You may have a sigmoidoscopy every 5 years or a colonoscopy every 10 years starting at age 69.  Prostate cancer screening. Recommendations will vary depending on your family history and other risks.  Hepatitis C blood test.  Hepatitis B blood test.  Sexually transmitted disease (STD) testing.  Diabetes screening. This is done by checking your blood sugar (glucose) after you have not eaten for a while (fasting). You may have this done every 1-3 years.  Abdominal aortic aneurysm (AAA) screening. You may need this if you are a current or former smoker.  Osteoporosis. You may be screened starting at age 69 if you are at high risk.  Talk with your health care provider about your test results, treatment options, and if necessary, the need for more tests. Vaccines Your health care provider may recommend certain vaccines, such as:  Influenza vaccine. This is recommended every year.  Tetanus, diphtheria, and acellular pertussis (Tdap, Td) vaccine. You may need a Td booster every 10 years.  Varicella vaccine. You may need this if you have not been vaccinated.  Zoster vaccine. You may need this after age 69.  Measles, mumps, and rubella (MMR) vaccine. You may need at least one dose of MMR if you were born in 1957 or later. You may also need a second dose.  Pneumococcal 13-valent conjugate (PCV13) vaccine. One dose is recommended after age 69.  Pneumococcal polysaccharide (PPSV23) vaccine. One dose is recommended after age 69.  Meningococcal vaccine. You may need this if you have certain conditions.  Hepatitis A vaccine. You may need this if you  have certain conditions or if you travel or work in places where you may be exposed to hepatitis A.  Hepatitis B vaccine. You may need this if you have certain conditions or if you travel or work in places where you may be exposed to hepatitis B.  Haemophilus influenzae type b (Hib) vaccine. You may need this if you have certain risk  factors.  Talk to your health care provider about which screenings and vaccines you need and how often you need them. This information is not intended to replace advice given to you by your health care provider. Make sure you discuss any questions you have with your health care provider. Document Released: 01/26/2015 Document Revised: 09/19/2015 Document Reviewed: 10/31/2014 Elsevier Interactive Patient Education  [employer].

## 2017-12-01 NOTE — Assessment & Plan Note (Addendum)
Patient encouraged to maintain heart healthy diet, regular exercise, adequate sleep. Consider daily probiotics. Take medications as prescribed. Declines flu shot, pneumonia shots and shingles shots. Cologuard is agreed to. Declined PSA

## 2017-12-27 ENCOUNTER — Other Ambulatory Visit: Payer: Self-pay | Admitting: Family Medicine

## 2018-01-18 ENCOUNTER — Other Ambulatory Visit: Payer: Self-pay | Admitting: Family Medicine

## 2018-01-25 ENCOUNTER — Ambulatory Visit (INDEPENDENT_AMBULATORY_CARE_PROVIDER_SITE_OTHER): Payer: Medicare HMO

## 2018-01-25 ENCOUNTER — Ambulatory Visit: Payer: Medicare HMO

## 2018-01-25 DIAGNOSIS — I255 Ischemic cardiomyopathy: Secondary | ICD-10-CM

## 2018-01-26 NOTE — Progress Notes (Signed)
Remote ICD transmission.   

## 2018-01-27 LAB — CUP PACEART REMOTE DEVICE CHECK
Date Time Interrogation Session: 20200113145500
Implantable Lead Implant Date: 20151029
Implantable Lead Location: 753862
Implantable Pulse Generator Implant Date: 20151029
MDC IDC MSMT BATTERY REMAINING PERCENTAGE: 52 %
MDC IDC PG SERIAL: 108164

## 2018-02-01 ENCOUNTER — Encounter: Payer: Self-pay | Admitting: Cardiology

## 2018-02-01 ENCOUNTER — Other Ambulatory Visit: Payer: Self-pay | Admitting: *Deleted

## 2018-02-01 MED ORDER — RIVAROXABAN 20 MG PO TABS: ORAL_TABLET | ORAL | 6 refills | Status: AC

## 2018-02-01 NOTE — Progress Notes (Signed)
This encounter was created in error - please disregard.

## 2018-02-01 NOTE — Telephone Encounter (Signed)
Pt is a 68 yr old male who saw PA on 06/22/17 . Weight on 12/01/17 was 85.7Kg, SCr on 12/01/17 was 1.31. CrCl is 41mL/min. Will refill Xarelto 20mg  QD.

## 2018-02-02 ENCOUNTER — Other Ambulatory Visit (HOSPITAL_COMMUNITY): Payer: Self-pay | Admitting: Physician Assistant

## 2018-02-04 ENCOUNTER — Encounter (HOSPITAL_COMMUNITY): Payer: Self-pay

## 2018-02-04 ENCOUNTER — Other Ambulatory Visit: Payer: Self-pay

## 2018-02-04 ENCOUNTER — Emergency Department (HOSPITAL_COMMUNITY): Payer: Medicare HMO

## 2018-02-04 ENCOUNTER — Observation Stay (HOSPITAL_COMMUNITY): Payer: Medicare HMO | Admitting: Certified Registered"

## 2018-02-04 ENCOUNTER — Encounter (HOSPITAL_COMMUNITY): Admission: EM | Disposition: E | Payer: Self-pay | Source: Home / Self Care | Attending: Neurology

## 2018-02-04 ENCOUNTER — Inpatient Hospital Stay (HOSPITAL_COMMUNITY)
Admission: EM | Admit: 2018-02-04 | Discharge: 2018-02-13 | DRG: 064 | Disposition: E | Payer: Medicare HMO | Attending: Neurology | Admitting: Neurology

## 2018-02-04 ENCOUNTER — Observation Stay (HOSPITAL_COMMUNITY): Payer: Medicare HMO

## 2018-02-04 DIAGNOSIS — I130 Hypertensive heart and chronic kidney disease with heart failure and stage 1 through stage 4 chronic kidney disease, or unspecified chronic kidney disease: Secondary | ICD-10-CM | POA: Diagnosis not present

## 2018-02-04 DIAGNOSIS — R001 Bradycardia, unspecified: Secondary | ICD-10-CM | POA: Diagnosis not present

## 2018-02-04 DIAGNOSIS — G936 Cerebral edema: Secondary | ICD-10-CM | POA: Diagnosis present

## 2018-02-04 DIAGNOSIS — Z87891 Personal history of nicotine dependence: Secondary | ICD-10-CM

## 2018-02-04 DIAGNOSIS — R1310 Dysphagia, unspecified: Secondary | ICD-10-CM | POA: Diagnosis present

## 2018-02-04 DIAGNOSIS — I629 Nontraumatic intracranial hemorrhage, unspecified: Secondary | ICD-10-CM

## 2018-02-04 DIAGNOSIS — R531 Weakness: Secondary | ICD-10-CM | POA: Diagnosis not present

## 2018-02-04 DIAGNOSIS — I639 Cerebral infarction, unspecified: Secondary | ICD-10-CM

## 2018-02-04 DIAGNOSIS — Z01818 Encounter for other preprocedural examination: Secondary | ICD-10-CM

## 2018-02-04 DIAGNOSIS — Z66 Do not resuscitate: Secondary | ICD-10-CM | POA: Diagnosis not present

## 2018-02-04 DIAGNOSIS — R4701 Aphasia: Secondary | ICD-10-CM | POA: Diagnosis present

## 2018-02-04 DIAGNOSIS — G8191 Hemiplegia, unspecified affecting right dominant side: Secondary | ICD-10-CM | POA: Diagnosis present

## 2018-02-04 DIAGNOSIS — Z452 Encounter for adjustment and management of vascular access device: Secondary | ICD-10-CM

## 2018-02-04 DIAGNOSIS — Z95810 Presence of automatic (implantable) cardiac defibrillator: Secondary | ICD-10-CM

## 2018-02-04 DIAGNOSIS — F419 Anxiety disorder, unspecified: Secondary | ICD-10-CM | POA: Diagnosis present

## 2018-02-04 DIAGNOSIS — I429 Cardiomyopathy, unspecified: Secondary | ICD-10-CM | POA: Diagnosis present

## 2018-02-04 DIAGNOSIS — I2510 Atherosclerotic heart disease of native coronary artery without angina pectoris: Secondary | ICD-10-CM | POA: Diagnosis present

## 2018-02-04 DIAGNOSIS — I6522 Occlusion and stenosis of left carotid artery: Secondary | ICD-10-CM | POA: Diagnosis not present

## 2018-02-04 DIAGNOSIS — E1151 Type 2 diabetes mellitus with diabetic peripheral angiopathy without gangrene: Secondary | ICD-10-CM | POA: Diagnosis present

## 2018-02-04 DIAGNOSIS — Z79899 Other long term (current) drug therapy: Secondary | ICD-10-CM

## 2018-02-04 DIAGNOSIS — D696 Thrombocytopenia, unspecified: Secondary | ICD-10-CM | POA: Diagnosis not present

## 2018-02-04 DIAGNOSIS — I63512 Cerebral infarction due to unspecified occlusion or stenosis of left middle cerebral artery: Secondary | ICD-10-CM

## 2018-02-04 DIAGNOSIS — I340 Nonrheumatic mitral (valve) insufficiency: Secondary | ICD-10-CM | POA: Diagnosis not present

## 2018-02-04 DIAGNOSIS — Z4682 Encounter for fitting and adjustment of non-vascular catheter: Secondary | ICD-10-CM | POA: Diagnosis not present

## 2018-02-04 DIAGNOSIS — I480 Paroxysmal atrial fibrillation: Secondary | ICD-10-CM | POA: Diagnosis present

## 2018-02-04 DIAGNOSIS — R402352 Coma scale, best motor response, localizes pain, at arrival to emergency department: Secondary | ICD-10-CM | POA: Diagnosis present

## 2018-02-04 DIAGNOSIS — I252 Old myocardial infarction: Secondary | ICD-10-CM

## 2018-02-04 DIAGNOSIS — Z515 Encounter for palliative care: Secondary | ICD-10-CM | POA: Diagnosis not present

## 2018-02-04 DIAGNOSIS — I69392 Facial weakness following cerebral infarction: Secondary | ICD-10-CM

## 2018-02-04 DIAGNOSIS — I63412 Cerebral infarction due to embolism of left middle cerebral artery: Secondary | ICD-10-CM | POA: Diagnosis not present

## 2018-02-04 DIAGNOSIS — R509 Fever, unspecified: Secondary | ICD-10-CM | POA: Diagnosis not present

## 2018-02-04 DIAGNOSIS — N182 Chronic kidney disease, stage 2 (mild): Secondary | ICD-10-CM | POA: Diagnosis present

## 2018-02-04 DIAGNOSIS — J9601 Acute respiratory failure with hypoxia: Secondary | ICD-10-CM

## 2018-02-04 DIAGNOSIS — Z7901 Long term (current) use of anticoagulants: Secondary | ICD-10-CM

## 2018-02-04 DIAGNOSIS — I482 Chronic atrial fibrillation, unspecified: Secondary | ICD-10-CM | POA: Diagnosis not present

## 2018-02-04 DIAGNOSIS — R414 Neurologic neglect syndrome: Secondary | ICD-10-CM | POA: Diagnosis present

## 2018-02-04 DIAGNOSIS — R29810 Facial weakness: Secondary | ICD-10-CM | POA: Diagnosis present

## 2018-02-04 DIAGNOSIS — Z7902 Long term (current) use of antithrombotics/antiplatelets: Secondary | ICD-10-CM

## 2018-02-04 DIAGNOSIS — I618 Other nontraumatic intracerebral hemorrhage: Secondary | ICD-10-CM | POA: Diagnosis not present

## 2018-02-04 DIAGNOSIS — R402232 Coma scale, best verbal response, inappropriate words, at arrival to emergency department: Secondary | ICD-10-CM | POA: Diagnosis present

## 2018-02-04 DIAGNOSIS — E785 Hyperlipidemia, unspecified: Secondary | ICD-10-CM | POA: Diagnosis present

## 2018-02-04 DIAGNOSIS — I10 Essential (primary) hypertension: Secondary | ICD-10-CM | POA: Diagnosis not present

## 2018-02-04 DIAGNOSIS — I5042 Chronic combined systolic (congestive) and diastolic (congestive) heart failure: Secondary | ICD-10-CM | POA: Diagnosis present

## 2018-02-04 DIAGNOSIS — R413 Other amnesia: Secondary | ICD-10-CM | POA: Diagnosis present

## 2018-02-04 DIAGNOSIS — Z7189 Other specified counseling: Secondary | ICD-10-CM | POA: Diagnosis not present

## 2018-02-04 DIAGNOSIS — R402142 Coma scale, eyes open, spontaneous, at arrival to emergency department: Secondary | ICD-10-CM | POA: Diagnosis present

## 2018-02-04 DIAGNOSIS — R404 Transient alteration of awareness: Secondary | ICD-10-CM | POA: Diagnosis not present

## 2018-02-04 DIAGNOSIS — H53461 Homonymous bilateral field defects, right side: Secondary | ICD-10-CM | POA: Diagnosis present

## 2018-02-04 DIAGNOSIS — E1122 Type 2 diabetes mellitus with diabetic chronic kidney disease: Secondary | ICD-10-CM | POA: Diagnosis present

## 2018-02-04 DIAGNOSIS — R000 Tachycardia, unspecified: Secondary | ICD-10-CM | POA: Diagnosis not present

## 2018-02-04 DIAGNOSIS — I619 Nontraumatic intracerebral hemorrhage, unspecified: Secondary | ICD-10-CM | POA: Diagnosis not present

## 2018-02-04 DIAGNOSIS — R262 Difficulty in walking, not elsewhere classified: Secondary | ICD-10-CM | POA: Diagnosis present

## 2018-02-04 DIAGNOSIS — Z8249 Family history of ischemic heart disease and other diseases of the circulatory system: Secondary | ICD-10-CM

## 2018-02-04 DIAGNOSIS — Z7982 Long term (current) use of aspirin: Secondary | ICD-10-CM

## 2018-02-04 DIAGNOSIS — R0689 Other abnormalities of breathing: Secondary | ICD-10-CM | POA: Diagnosis not present

## 2018-02-04 DIAGNOSIS — I6602 Occlusion and stenosis of left middle cerebral artery: Secondary | ICD-10-CM | POA: Diagnosis present

## 2018-02-04 DIAGNOSIS — N179 Acute kidney failure, unspecified: Secondary | ICD-10-CM | POA: Diagnosis present

## 2018-02-04 DIAGNOSIS — Z951 Presence of aortocoronary bypass graft: Secondary | ICD-10-CM

## 2018-02-04 DIAGNOSIS — R29722 NIHSS score 22: Secondary | ICD-10-CM | POA: Diagnosis present

## 2018-02-04 DIAGNOSIS — I4821 Permanent atrial fibrillation: Secondary | ICD-10-CM | POA: Diagnosis not present

## 2018-02-04 DIAGNOSIS — Z7984 Long term (current) use of oral hypoglycemic drugs: Secondary | ICD-10-CM

## 2018-02-04 DIAGNOSIS — I255 Ischemic cardiomyopathy: Secondary | ICD-10-CM | POA: Diagnosis not present

## 2018-02-04 DIAGNOSIS — I447 Left bundle-branch block, unspecified: Secondary | ICD-10-CM | POA: Diagnosis present

## 2018-02-04 HISTORY — PX: IR PERCUTANEOUS ART THROMBECTOMY/INFUSION INTRACRANIAL INC DIAG ANGIO: IMG6087

## 2018-02-04 HISTORY — PX: IR CT HEAD LTD: IMG2386

## 2018-02-04 HISTORY — PX: RADIOLOGY WITH ANESTHESIA: SHX6223

## 2018-02-04 LAB — PROTIME-INR
INR: 2.13
PROTHROMBIN TIME: 23.6 s — AB (ref 11.4–15.2)

## 2018-02-04 LAB — COMPREHENSIVE METABOLIC PANEL
ALT: 15 U/L (ref 0–44)
AST: 22 U/L (ref 15–41)
Albumin: 3.7 g/dL (ref 3.5–5.0)
Alkaline Phosphatase: 57 U/L (ref 38–126)
Anion gap: 11 (ref 5–15)
BUN: 21 mg/dL (ref 8–23)
CO2: 18 mmol/L — ABNORMAL LOW (ref 22–32)
Calcium: 9.3 mg/dL (ref 8.9–10.3)
Chloride: 108 mmol/L (ref 98–111)
Creatinine, Ser: 1.36 mg/dL — ABNORMAL HIGH (ref 0.61–1.24)
GFR calc Af Amer: >60 mL/min (ref >60)
GFR calc non Af Amer: 53 mL/min — ABNORMAL LOW (ref >60)
Glucose, Bld: 117 mg/dL — ABNORMAL HIGH (ref 70–99)
Potassium: 4.4 mmol/L (ref 3.5–5.1)
Sodium: 137 mmol/L (ref 135–145)
Total Bilirubin: 1.9 mg/dL — ABNORMAL HIGH (ref 0.3–1.2)
Total Protein: 6.7 g/dL (ref 6.5–8.1)

## 2018-02-04 LAB — DIFFERENTIAL
Abs Immature Granulocytes: 0.04 10*3/uL (ref 0.00–0.07)
Basophils Absolute: 0.1 10*3/uL (ref 0.0–0.1)
Basophils Relative: 1 %
EOS ABS: 0.1 10*3/uL (ref 0.0–0.5)
Eosinophils Relative: 1 %
Immature Granulocytes: 0 %
Lymphocytes Relative: 15 %
Lymphs Abs: 1.3 10*3/uL (ref 0.7–4.0)
Monocytes Absolute: 0.7 10*3/uL (ref 0.1–1.0)
Monocytes Relative: 8 %
Neutro Abs: 7 10*3/uL (ref 1.7–7.7)
Neutrophils Relative %: 75 %

## 2018-02-04 LAB — APTT: aPTT: 38 seconds — ABNORMAL HIGH (ref 24–36)

## 2018-02-04 LAB — CBC
HCT: 48.8 % (ref 39.0–52.0)
Hemoglobin: 14.4 g/dL (ref 13.0–17.0)
MCH: 31.8 pg (ref 26.0–34.0)
MCHC: 29.5 g/dL — AB (ref 30.0–36.0)
MCV: 107.7 fL — ABNORMAL HIGH (ref 80.0–100.0)
Platelets: 138 10*3/uL — ABNORMAL LOW (ref 150–400)
RBC: 4.53 MIL/uL (ref 4.22–5.81)
RDW: 13.3 % (ref 11.5–15.5)
WBC: 9.2 10*3/uL (ref 4.0–10.5)
nRBC: 0 % (ref 0.0–0.2)

## 2018-02-04 LAB — CBG MONITORING, ED: Glucose-Capillary: 109 mg/dL — ABNORMAL HIGH (ref 70–99)

## 2018-02-04 LAB — I-STAT TROPONIN, ED: Troponin i, poc: 0.21 ng/mL (ref 0.00–0.08)

## 2018-02-04 LAB — I-STAT CREATININE, ED: Creatinine, Ser: 1.4 mg/dL — ABNORMAL HIGH (ref 0.61–1.24)

## 2018-02-04 SURGERY — IR WITH ANESTHESIA
Anesthesia: General

## 2018-02-04 MED ORDER — ACETAMINOPHEN 325 MG PO TABS: 650.0000 mg | ORAL_TABLET | ORAL | Status: DC | PRN

## 2018-02-04 MED ORDER — NITROGLYCERIN 1 MG/10 ML FOR IR/CATH LAB
INTRA_ARTERIAL | Status: AC
  Filled 2018-02-04: qty 10

## 2018-02-04 MED ORDER — CLEVIDIPINE BUTYRATE 0.5 MG/ML IV EMUL
0.0000 mg/h | INTRAVENOUS | Status: DC
  Administered 2018-02-05: 2 mg/h via INTRAVENOUS
  Administered 2018-02-05: 5 mg/h via INTRAVENOUS
  Administered 2018-02-05: 14 mg/h via INTRAVENOUS
  Administered 2018-02-05: 19 mg/h via INTRAVENOUS
  Administered 2018-02-05 (×2): 2 mg/h via INTRAVENOUS
  Administered 2018-02-06: 14 mg/h via INTRAVENOUS
  Administered 2018-02-06: 6 mg/h via INTRAVENOUS
  Administered 2018-02-06 (×2): 14 mg/h via INTRAVENOUS
  Filled 2018-02-04 (×3): qty 50
  Filled 2018-02-04: qty 100
  Filled 2018-02-04 (×7): qty 50

## 2018-02-04 MED ORDER — LACTATED RINGERS IV SOLN
INTRAVENOUS | Status: DC | PRN
  Administered 2018-02-04 (×2): via INTRAVENOUS

## 2018-02-04 MED ORDER — PROPOFOL 10 MG/ML IV BOLUS
INTRAVENOUS | Status: DC | PRN
  Administered 2018-02-04: 50 mg via INTRAVENOUS
  Administered 2018-02-04: 60 mg via INTRAVENOUS

## 2018-02-04 MED ORDER — SENNOSIDES-DOCUSATE SODIUM 8.6-50 MG PO TABS: 1.0000 | ORAL_TABLET | Freq: Every evening | ORAL | Status: DC | PRN

## 2018-02-04 MED ORDER — ACETAMINOPHEN 650 MG RE SUPP: 650.0000 mg | RECTAL | Status: DC | PRN

## 2018-02-04 MED ORDER — CLOPIDOGREL BISULFATE 300 MG PO TABS
ORAL_TABLET | ORAL | Status: AC
  Filled 2018-02-04: qty 1

## 2018-02-04 MED ORDER — ACETAMINOPHEN 160 MG/5ML PO SOLN: 650.0000 mg | ORAL | Status: DC | PRN

## 2018-02-04 MED ORDER — PROPOFOL 1000 MG/100ML IV EMUL
INTRAVENOUS | Status: AC
  Filled 2018-02-04: qty 100

## 2018-02-04 MED ORDER — SODIUM CHLORIDE 0.9 % IV SOLN
INTRAVENOUS | Status: DC | PRN
  Administered 2018-02-04: 30 ug/min via INTRAVENOUS

## 2018-02-04 MED ORDER — ROCURONIUM 10MG/ML (10ML) SYRINGE FOR MEDFUSION PUMP - OPTIME
INTRAVENOUS | Status: DC | PRN
  Administered 2018-02-04: 50 mg via INTRAVENOUS

## 2018-02-04 MED ORDER — SODIUM CHLORIDE 0.9% FLUSH
3.0000 mL | Freq: Once | INTRAVENOUS | Status: AC
  Administered 2018-02-04: 3 mL via INTRAVENOUS

## 2018-02-04 MED ORDER — LIDOCAINE HCL 1 % IJ SOLN
INTRAMUSCULAR | Status: AC
  Filled 2018-02-04: qty 20

## 2018-02-04 MED ORDER — SODIUM CHLORIDE 0.9 % IV SOLN
INTRAVENOUS | Status: DC
  Administered 2018-02-04: via INTRAVENOUS

## 2018-02-04 MED ORDER — EPTIFIBATIDE 20 MG/10ML IV SOLN
INTRAVENOUS | Status: AC
  Filled 2018-02-04: qty 10

## 2018-02-04 MED ORDER — IOHEXOL 300 MG/ML  SOLN
50.0000 mL | Freq: Once | INTRAMUSCULAR | Status: AC | PRN
  Administered 2018-02-04: 50 mL via INTRA_ARTERIAL

## 2018-02-04 MED ORDER — TIROFIBAN HCL IN NACL 5-0.9 MG/100ML-% IV SOLN
INTRAVENOUS | Status: AC
  Filled 2018-02-04: qty 100

## 2018-02-04 MED ORDER — STROKE: EARLY STAGES OF RECOVERY BOOK
Freq: Once | Status: AC
  Administered 2018-02-04

## 2018-02-04 MED ORDER — FENTANYL CITRATE (PF) 100 MCG/2ML IJ SOLN
INTRAMUSCULAR | Status: AC
  Filled 2018-02-04: qty 2

## 2018-02-04 MED ORDER — TICAGRELOR 90 MG PO TABS
ORAL_TABLET | ORAL | Status: AC
  Filled 2018-02-04: qty 2

## 2018-02-04 MED ORDER — CEFAZOLIN SODIUM-DEXTROSE 2-4 GM/100ML-% IV SOLN
INTRAVENOUS | Status: AC
  Filled 2018-02-04: qty 100

## 2018-02-04 MED ORDER — LIDOCAINE HCL (CARDIAC) PF 100 MG/5ML IV SOSY
PREFILLED_SYRINGE | INTRAVENOUS | Status: DC | PRN
  Administered 2018-02-04: 100 mg via INTRAVENOUS

## 2018-02-04 MED ORDER — PROPOFOL 500 MG/50ML IV EMUL
INTRAVENOUS | Status: DC | PRN
  Administered 2018-02-04: 50 ug/kg/min via INTRAVENOUS

## 2018-02-04 MED ORDER — FENTANYL CITRATE (PF) 100 MCG/2ML IJ SOLN
INTRAMUSCULAR | Status: DC | PRN
  Administered 2018-02-04 (×2): 50 ug via INTRAVENOUS

## 2018-02-04 MED ORDER — FENTANYL CITRATE (PF) 100 MCG/2ML IJ SOLN
50.0000 ug | INTRAMUSCULAR | Status: DC | PRN
  Administered 2018-02-05: 50 ug via INTRAVENOUS

## 2018-02-04 MED ORDER — SUCCINYLCHOLINE CHLORIDE 20 MG/ML IJ SOLN
INTRAMUSCULAR | Status: DC | PRN
  Administered 2018-02-04: 120 mg via INTRAVENOUS

## 2018-02-04 MED ORDER — ASPIRIN 325 MG PO TABS
ORAL_TABLET | ORAL | Status: AC
  Filled 2018-02-04: qty 1

## 2018-02-04 MED ORDER — PROPOFOL 1000 MG/100ML IV EMUL
0.0000 ug/kg/min | INTRAVENOUS | Status: DC
  Administered 2018-02-05: 35 ug/kg/min via INTRAVENOUS
  Administered 2018-02-05: 40 ug/kg/min via INTRAVENOUS
  Administered 2018-02-05 (×2): 30 ug/kg/min via INTRAVENOUS
  Administered 2018-02-06 (×2): 40 ug/kg/min via INTRAVENOUS
  Administered 2018-02-06: 30 ug/kg/min via INTRAVENOUS
  Administered 2018-02-07: 20 ug/kg/min via INTRAVENOUS
  Administered 2018-02-07: 30 ug/kg/min via INTRAVENOUS
  Administered 2018-02-08: 40 ug/kg/min via INTRAVENOUS
  Administered 2018-02-08: 30 ug/kg/min via INTRAVENOUS
  Administered 2018-02-08: 10 ug/kg/min via INTRAVENOUS
  Filled 2018-02-04: qty 100
  Filled 2018-02-04: qty 200
  Filled 2018-02-04 (×5): qty 100
  Filled 2018-02-04: qty 200
  Filled 2018-02-04 (×3): qty 100

## 2018-02-04 MED ORDER — IOPAMIDOL (ISOVUE-370) INJECTION 76%
100.0000 mL | Freq: Once | INTRAVENOUS | Status: AC | PRN
  Administered 2018-02-04: 100 mL via INTRAVENOUS

## 2018-02-04 MED ORDER — ACETAMINOPHEN 650 MG RE SUPP
650.0000 mg | RECTAL | Status: DC | PRN
  Administered 2018-02-09 – 2018-02-10 (×2): 650 mg via RECTAL
  Filled 2018-02-04 (×2): qty 1

## 2018-02-04 MED ORDER — ALBUTEROL SULFATE HFA 108 (90 BASE) MCG/ACT IN AERS
INHALATION_SPRAY | RESPIRATORY_TRACT | Status: DC | PRN
  Administered 2018-02-04: 2 via RESPIRATORY_TRACT

## 2018-02-04 MED ORDER — FENTANYL CITRATE (PF) 100 MCG/2ML IJ SOLN
50.0000 ug | INTRAMUSCULAR | Status: DC | PRN
  Administered 2018-02-06 (×2): 50 ug via INTRAVENOUS
  Filled 2018-02-04 (×3): qty 2

## 2018-02-04 MED ORDER — PHENYLEPHRINE HCL 10 MG/ML IJ SOLN
INTRAMUSCULAR | Status: DC | PRN
  Administered 2018-02-04 (×2): 160 ug via INTRAVENOUS
  Administered 2018-02-04 (×2): 80 ug via INTRAVENOUS

## 2018-02-04 MED ORDER — SODIUM CHLORIDE 0.9 % IV SOLN: INTRAVENOUS | Status: DC

## 2018-02-04 NOTE — Progress Notes (Signed)
Patient ID: Larry Rangel, male   DOB: 12-20-1950, 68 y.o.   MRN: [id number] INR. 67 Y RH M LSW 755pm this evening. Acute onset of RT sided weakness with expressive aphasia and Lt gaze preference. CT head NO ICH ASPECTS 8 CTA occluded distal LT M1. mRS 0.  Findings reviewed with spouse . Marland KitchenEndovascular revascularization discussed with  spouse.Reasons,risks alternatives all reviewed. Risks of ICH of 10 % with worsening neuro deficit,death,inability to revascularize were also discussed.Spouse expressed understanding and provided informed witnessed consent  For endovascular treatment. S.Zykira Matlack MD

## 2018-02-04 NOTE — Anesthesia Procedure Notes (Signed)
Arterial Line Insertion Start/End01/22/2020 9:35 PM, 02/04/2018 9:37 PM Performed by: Melina Schools, CRNA, CRNA  Patient location: OR. Emergency situation Lidocaine 1% used for infiltration and patient sedated Left, ulnar was placed Catheter size: 20 G Hand hygiene performed  and maximum sterile barriers used   Attempts: 2 Procedure performed without using ultrasound guided technique. Following insertion, Biopatch and dressing applied. Post procedure assessment: normal  Patient tolerated the procedure well with no immediate complications.

## 2018-02-04 NOTE — Procedures (Signed)
S/P Lt common carotid artreriogram RT CFA approach. Findings. 1.Lt MCA M1 occlusion with early recanalization.procedure aborted after extravasation of contrast noted following deployment of  A 56mm x 40 mm solitaire device associated with severe spasm. NO changes noted in BP or HR. Device recaptured into microcath and retrieved . immediate CT brain demonstrates contrast in the Lt perisylvian fissure and Lt temporal cerebral convexity. No mass effect or midline shift noted . No intraparenchymal  hemorrhage noted. D/W neurology. Will obtain CT brain. S.Adael Culbreath MD

## 2018-02-04 NOTE — Anesthesia Preprocedure Evaluation (Addendum)
Anesthesia Evaluation  Patient identified by MRN, date of birth, ID band Patient confused    Reviewed: Allergy & Precautions, Patient's Chart, lab work & pertinent test results, Unable to perform ROS - Chart review onlyPreop documentation limited or incomplete due to emergent nature of procedure.  History of Anesthesia Complications Negative for: history of anesthetic complications  Airway       Comment: Unable to examine adequately  Dental  (+) Dental Advisory Given   Pulmonary sleep apnea , COPD, former smoker,     + wheezing      Cardiovascular hypertension, Pt. on medications + CAD, + Past MI, + CABG and +CHF  + dysrhythmias Atrial Fibrillation + Cardiac Defibrillator  Rhythm:Regular Rate:Tachycardia   '19 TTE - LV cavity size was severely dilated. There was   mild concentric hypertrophy. EF 20% to 25%. Severe diffuse hypokinesis with distinct regional wall motion abnormalities. There is akinesis of the inferior myocardium. There is akinesis of the mid-apicalinferolateral myocardium. Mild MR. LA was moderately dilated. RV systolic function was moderately reduced. Mild TR.   '19 Cath - Mid RCA lesion is 100% stenosed. LIMA. RIMA. Mid RCA to Dist RCA lesion is 50% stenosed. SVG. Seq SVG- OM1 and OM2. 2nd Mrg lesion is 95% stenosed. Prox Cx to Mid Cx lesion is 75% stenosed. Ost 1st Mrg lesion is 95% stenosed. Prox LAD lesion is 80% stenosed.  1. Severe 3 vessel CAD with severe proximal LAD stenosis, total RCA occlusion, and severe LCx/OM stenosis 2. S/P CABG with continued patency of the LIMA-LAD, free RIMA-RCA, and SVG-OM1. Chronic occlusion of the OM2 limb and SVG-diagonal unchanged from the last cath study 3. 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, likely culprit for large NSTEMI by enzymes    Neuro/Psych PSYCHIATRIC DISORDERS Anxiety CVA, Residual Symptoms    GI/Hepatic negative GI ROS, (+)     substance abuse  alcohol use,   Endo/Other  diabetes, Type 2, Oral Hypoglycemic Agents  Renal/GU Renal InsufficiencyRenal disease     Musculoskeletal  (+) Arthritis ,   Abdominal   Peds  Hematology negative hematology ROS (+)  Thrombocytopenia On anticoagulant therapy    Anesthesia Other Findings   Reproductive/Obstetrics                           Anesthesia Physical Anesthesia Plan  ASA: IV and emergent  Anesthesia Plan: General   Post-op Pain Management:    Induction: Intravenous and Rapid sequence  PONV Risk Score and Plan: 2 and Treatment may vary due to age or medical condition, Ondansetron and Dexamethasone  Airway Management Planned: Oral ETT  Additional Equipment: Arterial line  Intra-op Plan:   Post-operative Plan: Possible Post-op intubation/ventilation  Informed Consent: I have reviewed the patients History and Physical, chart, labs and discussed the procedure including the risks, benefits and alternatives for the proposed anesthesia with the patient or authorized representative who has indicated his/her understanding and acceptance.     Dental advisory given  Plan Discussed with: CRNA and Anesthesiologist  Anesthesia Plan Comments:       Anesthesia Quick Evaluation

## 2018-02-04 NOTE — Progress Notes (Signed)
RT transported patient from CT to 4N without any complications.

## 2018-02-04 NOTE — ED Notes (Signed)
Transported to IR 

## 2018-02-04 NOTE — Anesthesia Procedure Notes (Signed)
Procedure Name: Intubation Date/Time: 01/14/2018 9:37 PM Performed by: Claris Che, CRNA Pre-anesthesia Checklist: Patient identified, Emergency Drugs available, Suction available, Patient being monitored and Timeout performed Patient Re-evaluated:Patient Re-evaluated prior to induction Oxygen Delivery Method: Circle system utilized Preoxygenation: Pre-oxygenation with 100% oxygen Induction Type: IV induction, Rapid sequence and Cricoid Pressure applied Laryngoscope Size: Mac and 3 Grade View: Grade II Tube type: Oral Tube size: 7.5 mm Number of attempts: 2 Airway Equipment and Method: Stylet Placement Confirmation: ETT inserted through vocal cords under direct vision,  positive ETCO2 and breath sounds checked- equal and bilateral Secured at: 23 cm Tube secured with: Tape Dental Injury: Teeth and Oropharynx as per pre-operative assessment

## 2018-02-04 NOTE — ED Provider Notes (Addendum)
[facility] EMERGENCY DEPARTMENT Provider Note   CSN: [id number] Arrival date & time:      An emergency department physician performed an initial assessment on this suspected stroke patient at 2045.  History   Chief Complaint Chief Complaint  Patient presents with  . Code Stroke    HPI Larry Rangel is a 68 y.o. male history of CAD status post CABG and Rangel, Larry Rangel, Larry Rangel, Larry Rangel normal was 7:55 PM.  He was witnessed to have a sudden onset of trouble speaking as well as right arm and leg weakness and inability to walk.  Patient is already on Xarelto and Plavix.  NIH was 22 per EMS and code stroke was activated by EMS.  Patient unable to give me much history due to his significant expressive aphasia.  He is protecting his airway currently and he does have some drooling on the right side of his mouth.   The history is provided by the patient and the EMS personnel.  Level V caveat- condition of patient   Past Medical History:  Diagnosis Date  . Abnormal liver function 08/23/2010  . AICD (automatic cardioverter/defibrillator) present   . Anxiety   . Arthritis    "head to toe" (01/29/2017)  . CAD (coronary artery disease)    a. s/p CABG 2009. b. Cath 07/2013: 3/5 patent grafts (appear to have chronic occ grafts), mod diag/L PL branch, did not appear flow limiting, elevated filling pressures.  . Larry Rangel (congestive Larry Rangel) (Larry Rangel) 07/30/2013  . Chicken pox as a child  . Chronic combined systolic and diastolic Larry Rangel (congestive Larry Rangel) ([place])    a. Echo 6/14: Mild LVH, EF 30-35%, diffuse HK, MAC, mild BAE. b. Drop in EF to 20% by echo 07/2013.  Larry Rangel Hyperlipidemia   . Hypertension   . LBBB (left bundle branch block)   . Low back pain   . Measles as a child  . Mumps as a child  . Myocardial infarction ([place]) 2009  . Paroxysmal atrial flutter ([place]) 07-2012   a. s/p ablation by Dr [name] 08-06-2012. b.  Recurrence 07/2013.   Larry Rangel Preventative health care 12/10/2014  . Tobacco user   . Type II diabetes mellitus Larry Rangel)     Patient Active Problem List   Diagnosis Date Noted  . Acute ischemic left MCA stroke (Larry Rangel) 02/03/2018  . ACS (acute coronary syndrome) (Larry Rangel) 08/13/2017  . NSTEMI (non-ST elevated myocardial infarction) (Larry Rangel)   . Atrial fibrillation, currently in sinus rhythm   . Chronic combined systolic and diastolic Larry Rangel ([place]) 06/01/2017  . S/P CABG (coronary artery bypass graft)   . Longstanding persistent atrial fibrillation   . Memory loss 05/22/2017  . Acute on chronic combined systolic (congestive) and diastolic (congestive) Larry Rangel (Larry Rangel) 01/29/2017  . Pulmonary emphysema ([place]) 04/17/2015  . Preventative health care 12/10/2014  . Hematoma of implantable cardioverter-defibrillator (ICD) pocket 12/16/2013  . Ischemic cardiomyopathy 11/10/2013  . LBBB (left bundle branch block) 08/15/2013  . CAD (coronary artery disease)   . Paroxysmal atrial fibrillation ([place]) 08/02/2013  . Demand ischemia ([place]) 08/02/2013  . Larry Rangel (congestive Larry Rangel) (Larry Rangel) 07/30/2013  . Medicare welcome exam 06/12/2013  . Microalbuminuria 11/13/2012  . Anxiety state 11/13/2012  . OSA (obstructive sleep apnea) 07/22/2012  . Diabetes mellitus type 2 in obese (Larry Rangel) 03/17/2011  . Abnormal liver function 08/23/2010  . Essential hypertension 10/04/2009  . Hyperlipidemia 09/10/2009    Past  Surgical History:  Procedure Laterality Date  . ATRIAL FLUTTER ABLATION N/A 08/06/2012   Procedure: ATRIAL FLUTTER ABLATION;  Surgeon: Deboraha Sprang, MD;  Location: [facility] CATH LAB;  Service: Cardiovascular;  Laterality: N/A;  . CARDIAC CATHETERIZATION  2009  . CARDIOVERSION N/A 07/15/2012   Procedure: CARDIOVERSION;  Surgeon: Thayer Headings, MD;  Location: [facility] ENDOSCOPY;  Service: Cardiovascular;  Laterality: N/A;  . COLONOSCOPY    . CORONARY ARTERY BYPASS GRAFT  2009   x 5  . IMPLANTABLE CARDIOVERTER  DEFIBRILLATOR IMPLANT N/A 11/10/2013   Procedure: SUB Q IMPLANTABLE CARDIOVERTER DEFIBRILLATOR IMPLANT;  Surgeon: Deboraha Sprang, MD;  Location: [facility] CATH LAB;  Service: Cardiovascular;  Laterality: N/A;  . LEFT Larry CATH AND CORS/GRAFTS ANGIOGRAPHY N/A 08/13/2017   Procedure: LEFT Larry CATH AND CORS/GRAFTS ANGIOGRAPHY;  Surgeon: Sherren Mocha, MD;  Location: [facility] CV LAB;  Service: Cardiovascular;  Laterality: N/A;  . LEFT Larry CATHETERIZATION WITH CORONARY ANGIOGRAM N/A 08/01/2013   Procedure: LEFT Larry CATHETERIZATION WITH CORONARY ANGIOGRAM;  Surgeon: Burnell Blanks, MD;  Location: [facility] CATH LAB;  Service: Cardiovascular;  Laterality: N/A;  . POCKET REVISION N/A 12/16/2013   Procedure: POCKET REVISION;  Surgeon: Evans Lance, MD;  Location: [facility] CATH LAB;  Service: Cardiovascular;  Laterality: N/A;  . SHOULDER SURGERY Left    "sewed up; sports related"  . TEE WITHOUT CARDIOVERSION N/A 07/15/2012   Procedure: TRANSESOPHAGEAL ECHOCARDIOGRAM (TEE);  Surgeon: Thayer Headings, MD;  Location: [facility];  Service: Cardiovascular;  Laterality: N/A;  . TONSILLECTOMY          Home Medications    Prior to Admission medications   Medication Sig Start Date End Date Taking? Authorizing Provider  albuterol (PROVENTIL HFA;VENTOLIN HFA) 108 (90 BASE) MCG/ACT inhaler Inhale 2 puffs into the lungs every 6 (six) hours as needed for wheezing or shortness of breath. 12/05/14   Mosie Lukes, MD  aspirin EC 81 MG EC tablet Take 1 tablet (81 mg total) by mouth daily. Stop taking on 09/13/2017. 08/16/17   Ahmed Prima, Fransisco Hertz, PA-C  atorvastatin (LIPITOR) 80 MG tablet Take 1 tablet (80 mg total) by mouth daily at 6 PM. 08/15/17   Strader, Fransisco Hertz, PA-C  busPIRone (BUSPAR) 10 MG tablet TAKE 1 TABLET BY MOUTH TWICE A DAY 07/31/17   Mosie Lukes, MD  carvedilol (COREG) 12.5 MG tablet TAKE 1 TABLET BY MOUTH TWICE DAILY WITH A MEAL 12/08/16   Deboraha Sprang, MD  clonazePAM (KLONOPIN) 0.5 MG tablet  TAKE 1 TABLET BY MOUTH THREE TIMES A DAY AS NEEDED 04/30/17   Mosie Lukes, MD  clopidogrel (PLAVIX) 75 MG tablet Take 1 tablet (75 mg total) by mouth daily. 08/16/17   Strader, Fransisco Hertz, PA-C  furosemide (LASIX) 40 MG tablet Take 1 tablet (40 mg total) by mouth daily. 02/03/18   Barrett, Evelene Croon, PA-C  glipiZIDE (GLUCOTROL) 5 MG tablet TAKE 0.5 TABLETS (2.5 MG TOTAL) BY MOUTH DAILY BEFORE BREAKFAST. 12/29/17   Mosie Lukes, MD  losartan (COZAAR) 50 MG tablet Take 1 tablet (50 mg total) by mouth daily. 08/16/17   Ahmed Prima, Fransisco Hertz, PA-C  metFORMIN (GLUCOPHAGE) 500 MG tablet Take 1 tablet (500 mg total) by mouth 2 (two) times daily with a meal. 08/16/17   Strader, [place] M, PA-C  metFORMIN (GLUCOPHAGE) 500 MG tablet TAKE 1 TABLET BY MOUTH TWICE DAILY WITH A MEAL 11/30/17   Mosie Lukes, MD  metFORMIN (GLUCOPHAGE) 500 MG tablet TAKE 1 TABLET BY MOUTH  TWICE DAILY WITH A MEAL 01/19/18   Mosie Lukes, MD  nitroGLYCERIN (NITROSTAT) 0.4 MG SL tablet Place 1 tablet (0.4 mg total) under the tongue every 5 (five) minutes x 3 doses as needed for chest pain. 08/15/17   Strader, Fransisco Hertz, PA-C  rivaroxaban (XARELTO) 20 MG TABS tablet TAKE 1 TABLET BY MOUTH EVERY DAY WITH SUPPER 02/01/18   Deboraha Sprang, MD  spironolactone (ALDACTONE) 25 MG tablet TAKE 1/2 TABLET BY MOUTH DAILY 10/12/17   Barrett, Evelene Croon, PA-C    Family History Family History  Problem Relation Age of Onset  . Alzheimer's disease Mother   . Larry Rangel Father 5  . Hypertension Father   . Hyperlipidemia Father   . Cancer Father        lung- took half of a young- smoker  . Larry disease Father        MI at 72  . Leukemia Brother   . Drug abuse Daughter        heroine  . Early death Neg Hx   . Kidney disease Neg Hx   . Stroke Neg Hx     Social History Social History   Tobacco Use  . Smoking status: Former Smoker    Packs/day: 1.00    Years: 40.00    Pack years: 40.00    Types: Cigarettes    Rangel attempt to quit:  02/01/2011    Years since quitting: 7.0  . Smokeless tobacco: Never Used  Substance Use Topics  . Alcohol use: Yes    Alcohol/week: 12.0 standard drinks    Types: 12 Glasses of wine per week  . Drug use: No     Allergies   Patient has no known allergies.   Review of Systems Review of Systems  Neurological: Positive for speech difficulty and weakness.  All other systems reviewed and are negative.    Physical Exam Updated Vital Signs BP (!) 144/92   Pulse 76   Temp 98.9 F (37.2 C) (Oral)   Resp 18   Ht 5' 10"  (1.778 m)   Wt 89.4 kg   SpO2 100%   BMI 28.28 kg/m   Physical Exam Vitals signs and nursing note reviewed.  HENT:     Head: Normocephalic.     Nose: Nose normal.     Mouth/Throat:     Mouth: Mucous membranes are moist.  Eyes:     Pupils: Pupils are equal, round, and reactive to light.  Neck:     Musculoskeletal: Normal range of motion.  Cardiovascular:     Rate and Rhythm: Normal rate.  Pulmonary:     Effort: Pulmonary effort is normal.  Abdominal:     General: Abdomen is flat.  Musculoskeletal: Normal range of motion.  Skin:    General: Skin is warm.  Neurological:     Mental Status: He is alert.     Comments: + expressive aphasia, + R facial droop. Strength 3/5 R arm and leg, 4/5 l arm and leg.   Psychiatric:        Mood and Affect: Mood normal.      ED Treatments / Results  Labs (all labs ordered are listed, but only abnormal results are displayed) Labs Reviewed  PROTIME-INR - Abnormal; Notable for the following components:      Result Value   Prothrombin Time 23.6 (*)    All other components within normal limits  APTT - Abnormal; Notable for the following components:   aPTT 38 (*)  All other components within normal limits  CBC - Abnormal; Notable for the following components:   MCV 107.7 (*)    MCHC 29.5 (*)    Platelets 138 (*)    All other components within normal limits  I-STAT TROPONIN, ED - Abnormal; Notable for the  following components:   Troponin i, poc 0.21 (*)    All other components within normal limits  CBG MONITORING, ED - Abnormal; Notable for the following components:   Glucose-Capillary 109 (*)    All other components within normal limits  I-STAT CREATININE, ED - Abnormal; Notable for the following components:   Creatinine, Ser 1.40 (*)    All other components within normal limits  DIFFERENTIAL  COMPREHENSIVE METABOLIC PANEL  HEMOGLOBIN A1C  LIPID PANEL    EKG None  Radiology No results found.  Procedures Procedures (including critical care time)  CRITICAL CARE Performed by: Wandra Arthurs   Total critical care time: 30 minutes  Critical care time was exclusive of separately billable procedures and treating other patients.  Critical care was necessary to treat or prevent imminent or life-threatening deterioration.  Critical care was time spent personally by me on the following activities: development of treatment plan with patient and/or surrogate as well as nursing, discussions with consultants, evaluation of patient's response to treatment, examination of patient, obtaining history from patient or surrogate, ordering and performing treatments and interventions, ordering and review of laboratory studies, ordering and review of radiographic studies, pulse oximetry and re-evaluation of patient's condition.   Medications Ordered in ED Medications   stroke: mapping our early stages of recovery book (has no administration in time range)  0.9 %  sodium chloride infusion (has no administration in time range)  acetaminophen (TYLENOL) tablet 650 mg (has no administration in time range)    Or  acetaminophen (TYLENOL) solution 650 mg (has no administration in time range)    Or  acetaminophen (TYLENOL) suppository 650 mg (has no administration in time range)  senna-docusate (Senokot-S) tablet 1 tablet (has no administration in time range)  sodium chloride flush (NS) 0.9 % injection 3  mL (3 mLs Intravenous Given 01/31/2018 2056)  iopamidol (ISOVUE-370) 76 % injection 100 mL (100 mLs Intravenous Contrast Given 01/26/2018 2102)     Initial Impression / Assessment and Plan / ED Course  I have reviewed the triage vital signs and the nursing notes.  Pertinent labs & imaging results that were available during my care of the patient were reviewed by me and considered in my medical decision making (see chart for details).    NEVAN CREIGHTON is a 68 y.o. male Larry with code stroke. NIH is 22. Patient has expressive aphasia, R arm and leg weakness, R facial droop. Neuro at bedside.   9:14 PM CT showed L MCA stroke. Patient will go to IR for IR TPA. Neuro to admit for acute stroke. Protecting airway currently.     Final Clinical Impressions(s) / ED Diagnoses   Final diagnoses:  Acute ischemic stroke [facility])    ED Discharge Orders    None       Drenda Freeze, MD 02/06/2018 2114    Drenda Freeze, MD 01/15/2018 2118

## 2018-02-04 NOTE — ED Notes (Signed)
Bilateral groins shaved.

## 2018-02-04 NOTE — Transfer of Care (Signed)
Immediate Anesthesia Transfer of Care Note  Patient: Larry Rangel  Procedure(s) Performed: IR WITH ANESTHESIA (N/A )  Patient Location: ICU  Anesthesia Type:General  Level of Consciousness: sedated, unresponsive and Patient remains intubated per anesthesia plan  Airway & Oxygen Therapy: Patient remains intubated per anesthesia plan and Patient placed on Ventilator (see vital sign flow sheet for setting)  Post-op Assessment: Report given to RN and Post -op Vital signs reviewed and stable  Post vital signs: Reviewed and stable  Last Vitals:  Vitals Value Taken Time  BP 106/60 01/18/2018 11:38 PM  Temp 36.7 C 01/30/2018 11:38 PM  Pulse 81 02/11/2018 11:43 PM  Resp 14 02/01/2018 11:43 PM  SpO2 100 % 01/31/2018 11:43 PM  Vitals shown include unvalidated device data.  Last Pain:  Vitals:   02/01/2018 2338  TempSrc: Oral         Complications: No apparent anesthesia complications

## 2018-02-04 NOTE — Progress Notes (Signed)
Patient ID: Larry Rangel, male   DOB: 1950/02/13, 68 y.o.   MRN: [id number] Repeat CT brain demonstrated no sig change in the subarachnoid hemorrhage/contrast. No mass effect or shift noted. Ventricles stable. Plan to repeat CT in 4 hrs or sooner should clinical change dictate. . Patient to remain intubated for now. Neurology will review need for reversal agents depending on the scan in 4 hours.  Rt  groin remains soft with distal pulses palpable bilaterally. D/W spouse. S.Domini Vandehei MD

## 2018-02-04 NOTE — ED Triage Notes (Signed)
Pt comes via [facility] EMS, was at work LSN 1957 sudden onset of asphasia and flaccid R leg and drift in R arm, R sided facial droop, not following commands, LVO positive. Hx of MI, on Xalerto and Plavix. NIH 22

## 2018-02-05 ENCOUNTER — Inpatient Hospital Stay (HOSPITAL_COMMUNITY): Payer: Medicare HMO

## 2018-02-05 ENCOUNTER — Other Ambulatory Visit: Payer: Self-pay | Admitting: Internal Medicine

## 2018-02-05 ENCOUNTER — Encounter (HOSPITAL_COMMUNITY): Payer: Self-pay | Admitting: Interventional Radiology

## 2018-02-05 DIAGNOSIS — I340 Nonrheumatic mitral (valve) insufficiency: Secondary | ICD-10-CM

## 2018-02-05 DIAGNOSIS — I6602 Occlusion and stenosis of left middle cerebral artery: Secondary | ICD-10-CM

## 2018-02-05 DIAGNOSIS — I63512 Cerebral infarction due to unspecified occlusion or stenosis of left middle cerebral artery: Secondary | ICD-10-CM

## 2018-02-05 DIAGNOSIS — I639 Cerebral infarction, unspecified: Secondary | ICD-10-CM

## 2018-02-05 DIAGNOSIS — R0689 Other abnormalities of breathing: Secondary | ICD-10-CM

## 2018-02-05 DIAGNOSIS — I629 Nontraumatic intracranial hemorrhage, unspecified: Secondary | ICD-10-CM

## 2018-02-05 LAB — CBC WITH DIFFERENTIAL/PLATELET
Abs Immature Granulocytes: 0 10*3/uL (ref 0.00–0.07)
BASOS ABS: 0 10*3/uL (ref 0.0–0.1)
Basophils Relative: 0 %
EOS ABS: 0 10*3/uL (ref 0.0–0.5)
Eosinophils Relative: 0 %
HEMATOCRIT: 40.8 % (ref 39.0–52.0)
Hemoglobin: 13.2 g/dL (ref 13.0–17.0)
Lymphocytes Relative: 3 %
Lymphs Abs: 0.4 10*3/uL — ABNORMAL LOW (ref 0.7–4.0)
MCH: 33.2 pg (ref 26.0–34.0)
MCHC: 32.4 g/dL (ref 30.0–36.0)
MCV: 102.5 fL — ABNORMAL HIGH (ref 80.0–100.0)
Monocytes Absolute: 0.5 10*3/uL (ref 0.1–1.0)
Monocytes Relative: 4 %
NRBC: 0 % (ref 0.0–0.2)
Neutro Abs: 12.4 10*3/uL — ABNORMAL HIGH (ref 1.7–7.7)
Neutrophils Relative %: 93 %
Platelets: 135 10*3/uL — ABNORMAL LOW (ref 150–400)
RBC: 3.98 MIL/uL — ABNORMAL LOW (ref 4.22–5.81)
RDW: 13.3 % (ref 11.5–15.5)
WBC: 13.3 10*3/uL — ABNORMAL HIGH (ref 4.0–10.5)
nRBC: 0 /100 WBC

## 2018-02-05 LAB — BLOOD GAS, ARTERIAL
ACID-BASE DEFICIT: 4.5 mmol/L — AB (ref 0.0–2.0)
Bicarbonate: 20.3 mmol/L (ref 20.0–28.0)
Drawn by: 317771
FIO2: 60
LHR: 16 {breaths}/min
MECHVT: 580 mL
O2 Saturation: 99.4 %
PEEP: 5 cmH2O
PO2 ART: 233 mmHg — AB (ref 83.0–108.0)
Patient temperature: 98.7
pCO2 arterial: 39.2 mmHg (ref 32.0–48.0)
pH, Arterial: 7.335 — ABNORMAL LOW (ref 7.350–7.450)

## 2018-02-05 LAB — BASIC METABOLIC PANEL
Anion gap: 9 (ref 5–15)
BUN: 23 mg/dL (ref 8–23)
CO2: 19 mmol/L — AB (ref 22–32)
Calcium: 8.8 mg/dL — ABNORMAL LOW (ref 8.9–10.3)
Chloride: 109 mmol/L (ref 98–111)
Creatinine, Ser: 1.4 mg/dL — ABNORMAL HIGH (ref 0.61–1.24)
GFR calc Af Amer: 59 mL/min — ABNORMAL LOW (ref >60)
GFR calc non Af Amer: 51 mL/min — ABNORMAL LOW (ref >60)
Glucose, Bld: 194 mg/dL — ABNORMAL HIGH (ref 70–99)
Potassium: 4.2 mmol/L (ref 3.5–5.1)
Sodium: 137 mmol/L (ref 135–145)

## 2018-02-05 LAB — GLUCOSE, CAPILLARY
GLUCOSE-CAPILLARY: 82 mg/dL (ref 70–99)
Glucose-Capillary: 191 mg/dL — ABNORMAL HIGH (ref 70–99)
Glucose-Capillary: 129 mg/dL — ABNORMAL HIGH (ref 70–99)
Glucose-Capillary: 91 mg/dL (ref 70–99)
Glucose-Capillary: 101 mg/dL — ABNORMAL HIGH (ref 70–99)
Glucose-Capillary: 113 mg/dL — ABNORMAL HIGH (ref 70–99)

## 2018-02-05 LAB — LIPID PANEL
Cholesterol: 130 mg/dL (ref 0–200)
HDL: 32 mg/dL — ABNORMAL LOW (ref >40)
LDL Cholesterol: 68 mg/dL (ref 0–99)
Total CHOL/HDL Ratio: 4.1 RATIO
Triglycerides: 148 mg/dL (ref <150)
VLDL: 30 mg/dL (ref 0–40)

## 2018-02-05 LAB — ECHOCARDIOGRAM COMPLETE
Height: 70 in
Weight: 3107.6 oz

## 2018-02-05 LAB — SODIUM
Sodium: 140 mmol/L (ref 135–145)
Sodium: 142 mmol/L (ref 135–145)

## 2018-02-05 LAB — HEMOGLOBIN A1C
Hgb A1c MFr Bld: 5.1 % (ref 4.8–5.6)
Mean Plasma Glucose: 99.67 mg/dL

## 2018-02-05 LAB — MRSA PCR SCREENING: MRSA by PCR: POSITIVE — AB

## 2018-02-05 LAB — PHOSPHORUS: Phosphorus: 3.7 mg/dL (ref 2.5–4.6)

## 2018-02-05 LAB — TRIGLYCERIDES: Triglycerides: 149 mg/dL (ref <150)

## 2018-02-05 LAB — MAGNESIUM: Magnesium: 2.1 mg/dL (ref 1.7–2.4)

## 2018-02-05 MED ORDER — PANTOPRAZOLE SODIUM 40 MG IV SOLR
40.0000 mg | INTRAVENOUS | Status: DC
  Administered 2018-02-06 – 2018-02-10 (×4): 40 mg via INTRAVENOUS
  Filled 2018-02-05 (×4): qty 40

## 2018-02-05 MED ORDER — PERFLUTREN LIPID MICROSPHERE
INTRAVENOUS | Status: AC
  Filled 2018-02-05: qty 10

## 2018-02-05 MED ORDER — PRO-STAT SUGAR FREE PO LIQD
30.0000 mL | Freq: Two times a day (BID) | ORAL | Status: DC
  Administered 2018-02-05 – 2018-02-08 (×7): 30 mL
  Filled 2018-02-05 (×7): qty 30

## 2018-02-05 MED ORDER — MUPIROCIN 2 % EX OINT
TOPICAL_OINTMENT | Freq: Two times a day (BID) | CUTANEOUS | Status: DC
  Administered 2018-02-05: 1 via NASAL
  Administered 2018-02-06: 11:00:00 via NASAL
  Administered 2018-02-06 – 2018-02-07 (×2): 1 via NASAL
  Administered 2018-02-07 – 2018-02-08 (×2): via NASAL
  Filled 2018-02-05: qty 22

## 2018-02-05 MED ORDER — LABETALOL HCL 5 MG/ML IV SOLN
10.0000 mg | INTRAVENOUS | Status: DC | PRN
  Administered 2018-02-06: 10 mg via INTRAVENOUS
  Filled 2018-02-05: qty 4

## 2018-02-05 MED ORDER — MIDAZOLAM HCL 2 MG/2ML IJ SOLN: 1.0000 mg | INTRAMUSCULAR | Status: DC | PRN

## 2018-02-05 MED ORDER — SODIUM CHLORIDE 3 % IV SOLN
INTRAVENOUS | Status: AC
  Administered 2018-02-05 – 2018-02-06 (×4): 75 mL/h via INTRAVENOUS
  Filled 2018-02-05 (×4): qty 500

## 2018-02-05 MED ORDER — ORAL CARE MOUTH RINSE
15.0000 mL | OROMUCOSAL | Status: DC
  Administered 2018-02-05 – 2018-02-10 (×44): 15 mL via OROMUCOSAL

## 2018-02-05 MED ORDER — PERFLUTREN LIPID MICROSPHERE
1.0000 mL | INTRAVENOUS | Status: AC | PRN
  Administered 2018-02-05: 3 mL via INTRAVENOUS
  Filled 2018-02-05: qty 10

## 2018-02-05 MED ORDER — ADULT MULTIVITAMIN LIQUID CH
15.0000 mL | Freq: Every day | ORAL | Status: DC
  Administered 2018-02-05 – 2018-02-08 (×4): 15 mL
  Filled 2018-02-05 (×4): qty 15

## 2018-02-05 MED ORDER — INSULIN ASPART 100 UNIT/ML ~~LOC~~ SOLN
0.0000 [IU] | SUBCUTANEOUS | Status: DC
  Administered 2018-02-05: 2 [IU] via SUBCUTANEOUS
  Administered 2018-02-05 – 2018-02-06 (×2): 3 [IU] via SUBCUTANEOUS
  Administered 2018-02-06 – 2018-02-07 (×4): 2 [IU] via SUBCUTANEOUS
  Administered 2018-02-07: 3 [IU] via SUBCUTANEOUS
  Administered 2018-02-08: 2 [IU] via SUBCUTANEOUS

## 2018-02-05 MED ORDER — VITAL HIGH PROTEIN PO LIQD
1000.0000 mL | ORAL | Status: DC
  Administered 2018-02-05 – 2018-02-07 (×3): 1000 mL

## 2018-02-05 MED ORDER — CHLORHEXIDINE GLUCONATE 0.12% ORAL RINSE (MEDLINE KIT)
15.0000 mL | Freq: Two times a day (BID) | OROMUCOSAL | Status: DC
  Administered 2018-02-05 – 2018-02-09 (×10): 15 mL via OROMUCOSAL

## 2018-02-05 NOTE — Consult Note (Signed)
NAME:  Larry MonarchRandall L Rangel, MRN:  [id number], DOB:  Sep 23, 1950, LOS: 1 ADMISSION DATE:  12/19/2018, CONSULTATION DATE:  1/23 REFERRING MD:  Dr. Laurence SlateAroor, CHIEF COMPLAINT:  CVA with hemorrhagic conversion.    Brief History   68 year old male admitted with L MCA CVA and taken to IR without successful intervention. Hemorrhagic conversion noted post-op. On vent in ICU.   History of present illness   68 year old male with past medical history as below, which is significant for coronary artery disease status post CABG in 2009, systolic congestive heart failure with a EF of 20 to 25%, type 2 diabetes, paroxysmal atrial fibrillation on Xarelto, and hypertension.  He presented to [facility] emergency department on 1/23 as a code stroke.  He was last known well at 7:55 PM while at work.  He had a witnessed onset of dysarthria and EMS was called and initiated code stroke.  Imaging of the brain in the emergency department were consistent with left MCA infarct.  He was taken to IR for attempted mechanical thrombectomy which was unfortunately unsuccessful.  Postoperative CT scan demonstrated hemorrhagic conversion.  He was transferred to the ICU on the ventilator.  PCCM was asked to consult.  Past Medical History   has a past medical history of Abnormal liver function (08/23/2010), AICD (automatic cardioverter/defibrillator) present, Anxiety, Arthritis, CAD (coronary artery disease), CHF (congestive heart failure) (HCC) (07/30/2013), Chicken pox (as a child), Chronic combined systolic and diastolic CHF (congestive heart failure) (HCC), Hyperlipidemia, Hypertension, LBBB (left bundle branch block), Low back pain, Measles (as a child), Mumps (as a child), Myocardial infarction (HCC) (2009), Paroxysmal atrial flutter (HCC) (07-2012), Preventative health care (12/10/2014), Tobacco user, and Type II diabetes mellitus (HCC).   Significant Hospital Events   1/23 admit  Consults:  IR PCCM  Procedures:  ETT 1/23 > Art line  1/23 > IR mechanical thrombectomy attempt 1/23 >  Significant Diagnostic Tests:  CT head 1/23 >>> CTA/P 1/23 > Positive study for emergent large vessel occlusion, with occlusion of the distal left M1 segment. Little to no collateral flow seen distally within the left MCA distribution. Approximate 50% atheromatous stenosis at the proximal left ICA. Occluded vertebral artery within the neck, with irregular attenuated distal reconstitution just prior to the skull base. Parotid carotid siphon atherosclerosis with associated moderate diffuse narrowing, right worse than left. CT head 1/24 > Similar predominately LEFT extra-axial density compatible with contrast extravasation and hemorrhage with minimal extra-axial pneumocephalus. No definite intraparenchymal hemorrhage.  Micro Data:    Antimicrobials:    Interim history/subjective:    Objective   Blood pressure 106/60, pulse 85, temperature 98 F (36.7 C), temperature source Oral, resp. rate 16, height 5\' 10"  (1.778 m), weight 88.1 kg, SpO2 100 %.    Vent Mode: PRVC FiO2 (%):  [60 %] 60 % Set Rate:  [16 bmp] 16 bmp Vt Set:  [580 mL] 580 mL PEEP:  [5 cmH20] 5 cmH20 Plateau Pressure:  [21 cmH20] 21 cmH20   Intake/Output Summary (Last 24 hours) at 02/05/2018 0017 Last data filed at 12/19/2018 2300 Gross per 24 hour  Intake 1000 ml  Output 85 ml  Net 915 ml   Filed Weights   05-21-2018 2051 05-21-2018 2338  Weight: 89.4 kg 88.1 kg    Examination: General: Adult male on ventilator HENT: [place]/AT, PERRL, no JVD Lungs: Clear Cardiovascular: IRIR, no MRG Abdomen: Soft, non-distended Extremities: No acute deformity Neuro: Sedated  Resolved Hospital Problem list     Assessment & Plan:  Acute L MCA CVA complicated by hemorrhagic conversion - Management per neurology/stroke/IR - SBP goals 110-16mmHg per IR - Planning for repeat CT 0400.  - Neuro checks  Acute respiratory failure due to CVA - Full vent support - Propofol for  sedation with RASS goal -1 to -2 - Daily WUA/SBT - CXR - ABG  Paroxysmal atrial fibrillation - Telemetry monitoring - Holding home rivaroxaban  Chronic HFrEF (LVEF 20-25%) - Holding home lasix, spironolactone, losartan, carvedilol  DM2 - holding home metformin - SSI   Best practice:  Diet: NPO Pain/Anxiety/Delirium protocol (if indicated): Propofol VAP protocol (if indicated): Per protocol DVT prophylaxis: SCD GI prophylaxis: PPI Glucose control: SSI Mobility: BR Code Status: FULL Family Communication: wife updated bedside Disposition: ICU  Labs   CBC: Recent Labs  Lab 01/14/2018 2045  WBC 9.2  NEUTROABS 7.0  HGB 14.4  HCT 48.8  MCV 107.7*  PLT 138*    Basic Metabolic Panel: Recent Labs  Lab 01/28/2018 2045 02/03/2018 2100  NA 137  --   K 4.4  --   CL 108  --   CO2 18*  --   GLUCOSE 117*  --   BUN 21  --   CREATININE 1.36* 1.40*  CALCIUM 9.3  --    GFR: Estimated Creatinine Clearance: 55.6 mL/min (A) (by C-G formula based on SCr of 1.4 mg/dL (H)). Recent Labs  Lab 01/13/2018 2045  WBC 9.2    Liver Function Tests: Recent Labs  Lab 01/13/2018 2045  AST 22  ALT 15  ALKPHOS 57  BILITOT 1.9*  PROT 6.7  ALBUMIN 3.7   No results for input(s): LIPASE, AMYLASE in the last 168 hours. No results for input(s): AMMONIA in the last 168 hours.  ABG    Component Value Date/Time   PHART 7.378 08/01/2013 0940   PCO2ART 33.6 (L) 08/01/2013 0940   PO2ART 66.0 (L) 08/01/2013 0940   HCO3 21.1 08/01/2013 0945   TCO2 22 08/01/2013 0945   ACIDBASEDEF 5.0 (H) 08/01/2013 0945   O2SAT 49.0 08/01/2013 0945     Coagulation Profile: Recent Labs  Lab 01/29/2018 2045  INR 2.13    Cardiac Enzymes: No results for input(s): CKTOTAL, CKMB, CKMBINDEX, TROPONINI in the last 168 hours.  HbA1C: Hgb A1c MFr Bld  Date/Time Value Ref Range Status  12/01/2017 10:51 AM 5.1 4.6 - 6.5 % Final    Comment:    Glycemic Control Guidelines for People with Diabetes:Non  Diabetic:  <6%Goal of Therapy: <7%Additional Action Suggested:  >8%   01/29/2017 01:00 PM 4.8 4.8 - 5.6 % Final    Comment:    (NOTE) Pre diabetes:          5.7%-6.4% Diabetes:              >6.4% Glycemic control for   <7.0% adults with diabetes     CBG: Recent Labs  Lab 02/09/2018 2047  GLUCAP 109*    Review of Systems:   Unable, intubated  Past Medical History  He,  has a past medical history of Abnormal liver function (08/23/2010), AICD (automatic cardioverter/defibrillator) present, Anxiety, Arthritis, CAD (coronary artery disease), CHF (congestive heart failure) (HCC) (07/30/2013), Chicken pox (as a child), Chronic combined systolic and diastolic CHF (congestive heart failure) (HCC), Hyperlipidemia, Hypertension, LBBB (left bundle branch block), Low back pain, Measles (as a child), Mumps (as a child), Myocardial infarction (HCC) (2009), Paroxysmal atrial flutter (HCC) (07-2012), Preventative health care (12/10/2014), Tobacco user, and Type II diabetes mellitus (HCC).   Surgical History  Past Surgical History:  Procedure Laterality Date  . ATRIAL FLUTTER ABLATION N/A 08/06/2012   Procedure: ATRIAL FLUTTER ABLATION;  Surgeon: Duke Salvia, MD;  Location: [facility] CATH LAB;  Service: Cardiovascular;  Laterality: N/A;  . CARDIAC CATHETERIZATION  2009  . CARDIOVERSION N/A 07/15/2012   Procedure: CARDIOVERSION;  Surgeon: Vesta Mixer, MD;  Location: [facility] ENDOSCOPY;  Service: Cardiovascular;  Laterality: N/A;  . COLONOSCOPY    . CORONARY ARTERY BYPASS GRAFT  2009   x 5  . IMPLANTABLE CARDIOVERTER DEFIBRILLATOR IMPLANT N/A 11/10/2013   Procedure: SUB Q IMPLANTABLE CARDIOVERTER DEFIBRILLATOR IMPLANT;  Surgeon: Duke Salvia, MD;  Location: [facility] CATH LAB;  Service: Cardiovascular;  Laterality: N/A;  . LEFT HEART CATH AND CORS/GRAFTS ANGIOGRAPHY N/A 08/13/2017   Procedure: LEFT HEART CATH AND CORS/GRAFTS ANGIOGRAPHY;  Surgeon: Tonny Bollman, MD;  Location: [facility] INVASIVE CV LAB;  Service:  Cardiovascular;  Laterality: N/A;  . LEFT HEART CATHETERIZATION WITH CORONARY ANGIOGRAM N/A 08/01/2013   Procedure: LEFT HEART CATHETERIZATION WITH CORONARY ANGIOGRAM;  Surgeon: Kathleene Hazel, MD;  Location: [facility] CATH LAB;  Service: Cardiovascular;  Laterality: N/A;  . POCKET REVISION N/A 12/16/2013   Procedure: POCKET REVISION;  Surgeon: Marinus Maw, MD;  Location: [facility] CATH LAB;  Service: Cardiovascular;  Laterality: N/A;  . SHOULDER SURGERY Left    "sewed up; sports related"  . TEE WITHOUT CARDIOVERSION N/A 07/15/2012   Procedure: TRANSESOPHAGEAL ECHOCARDIOGRAM (TEE);  Surgeon: Vesta Mixer, MD;  Location: [facility] ENDOSCOPY;  Service: Cardiovascular;  Laterality: N/A;  . TONSILLECTOMY       Social History   reports that he quit smoking about 7 years ago. His smoking use included cigarettes. He has a 40.00 pack-year smoking history. He has never used smokeless tobacco. He reports current alcohol use of about 12.0 standard drinks of alcohol per week. He reports that he does not use drugs.   Family History   His family history includes Alzheimer's disease in his mother; Cancer in his father; Drug abuse in his daughter; Heart disease in his father; Heart failure (age of onset: 100) in his father; Hyperlipidemia in his father; Hypertension in his father; Leukemia in his brother. There is no history of Early death, Kidney disease, or Stroke.   Allergies No Known Allergies   Home Medications  Prior to Admission medications   Medication Sig Start Date End Date Taking? Authorizing Provider  albuterol (PROVENTIL HFA;VENTOLIN HFA) 108 (90 BASE) MCG/ACT inhaler Inhale 2 puffs into the lungs every 6 (six) hours as needed for wheezing or shortness of breath. 12/05/14   Bradd Canary, MD  aspirin EC 81 MG EC tablet Take 1 tablet (81 mg total) by mouth daily. Stop taking on 09/13/2017. 08/16/17   [place] Ouch, Lennart Pall, PA-C  atorvastatin (LIPITOR) 80 MG tablet Take 1 tablet (80 mg total) by mouth daily at  6 PM. 08/15/17   Strader, Lennart Pall, PA-C  busPIRone (BUSPAR) 10 MG tablet TAKE 1 TABLET BY MOUTH TWICE A DAY 07/31/17   Bradd Canary, MD  carvedilol (COREG) 12.5 MG tablet TAKE 1 TABLET BY MOUTH TWICE DAILY WITH A MEAL 12/08/16   Duke Salvia, MD  clonazePAM (KLONOPIN) 0.5 MG tablet TAKE 1 TABLET BY MOUTH THREE TIMES A DAY AS NEEDED 04/30/17   Bradd Canary, MD  clopidogrel (PLAVIX) 75 MG tablet Take 1 tablet (75 mg total) by mouth daily. 08/16/17   Strader, Lennart Pall, PA-C  furosemide (LASIX) 40 MG tablet Take 1 tablet (40 mg total)  by mouth daily. 02/03/18   Barrett, Joline Salt, PA-C  glipiZIDE (GLUCOTROL) 5 MG tablet TAKE 0.5 TABLETS (2.5 MG TOTAL) BY MOUTH DAILY BEFORE BREAKFAST. 12/29/17   Bradd Canary, MD  losartan (COZAAR) 50 MG tablet Take 1 tablet (50 mg total) by mouth daily. 08/16/17   [place] Ouch, Lennart Pall, PA-C  metFORMIN (GLUCOPHAGE) 500 MG tablet Take 1 tablet (500 mg total) by mouth 2 (two) times daily with a meal. 08/16/17   Strader, [place] M, PA-C  metFORMIN (GLUCOPHAGE) 500 MG tablet TAKE 1 TABLET BY MOUTH TWICE DAILY WITH A MEAL 11/30/17   Bradd Canary, MD  metFORMIN (GLUCOPHAGE) 500 MG tablet TAKE 1 TABLET BY MOUTH TWICE DAILY WITH A MEAL 01/19/18   Bradd Canary, MD  nitroGLYCERIN (NITROSTAT) 0.4 MG SL tablet Place 1 tablet (0.4 mg total) under the tongue every 5 (five) minutes x 3 doses as needed for chest pain. 08/15/17   Strader, Lennart Pall, PA-C  rivaroxaban (XARELTO) 20 MG TABS tablet TAKE 1 TABLET BY MOUTH EVERY DAY WITH SUPPER 02/01/18   Duke Salvia, MD  spironolactone (ALDACTONE) 25 MG tablet TAKE 1/2 TABLET BY MOUTH DAILY 10/12/17   Barrett, Joline Salt, PA-C     Joneen Roach, AGACNP-BC [facility] Pulmonary/Critical Care Pager [phone] or [phone]  02/05/2018 12:35 AM

## 2018-02-05 NOTE — Anesthesia Postprocedure Evaluation (Signed)
Anesthesia Post Note  Patient: Larry Rangel  Procedure(s) Performed: IR WITH ANESTHESIA (N/A )     Patient location during evaluation: ICU Anesthesia Type: General Level of consciousness: sedated and patient remains intubated per anesthesia plan Pain management: pain level controlled Vital Signs Assessment: post-procedure vital signs reviewed and stable Respiratory status: patient on ventilator - see flowsheet for VS Cardiovascular status: stable Postop Assessment: no apparent nausea or vomiting Anesthetic complications: no    Last Vitals:  Vitals:   02/05/18 0400 02/05/18 0425  BP:    Pulse: 73   Resp: 15   Temp:  36.5 C  SpO2: 100%     Last Pain:  Vitals:   02/05/18 0425  TempSrc: Axillary                 Beryle Lathe

## 2018-02-05 NOTE — Progress Notes (Signed)
  Echocardiogram 2D Echocardiogram has been performed.  Larry Rangel Larry Rangel 02/05/2018, 2:00 PM

## 2018-02-05 NOTE — Progress Notes (Signed)
Chaplain responded to referral from staff.  Chaplain stopped in and daughter was bedside.  "It would be better to wait when my mother is here."  Chaplain will follow-up with visit when wife is present. Lynnell Chad Pager [phone]

## 2018-02-05 NOTE — Progress Notes (Signed)
Chaplain returned to visit wife bedside. Pt's son also present.   Chaplain offered "Service for [employer]" for patient. Chaplain will continue to be present for family. Lynnell Chad  Pager [phone]

## 2018-02-05 NOTE — Progress Notes (Signed)
Referring Physician(s): CODE STROKE- Aroor, Dara Lords  Supervising Physician: Julieanne Cotton  Patient Status:  [facility] - In-pt  Chief Complaint: None  Subjective:  Left MCA M1 segment occlusion s/p attempted mechanical thrombectomy 02-13-2018 by Dr. Corliss Skains. Patient laying in bed intubated and sedated. Accompanied by wife and daughter at bedside. Can spontaneously move left side, RN and family report intermittent movements of RLE, no spontaneous movements of RUE. Right groin incision c/d/i.   Allergies: Patient has no known allergies.  Medications: Prior to Admission medications   Medication Sig Start Date End Date Taking? Authorizing Provider  albuterol (PROVENTIL HFA;VENTOLIN HFA) 108 (90 BASE) MCG/ACT inhaler Inhale 2 puffs into the lungs every 6 (six) hours as needed for wheezing or shortness of breath. 12/05/14   Bradd Canary, MD  aspirin EC 81 MG EC tablet Take 1 tablet (81 mg total) by mouth daily. Stop taking on 09/13/2017. 08/16/17   [place] Ouch, Lennart Pall, PA-C  atorvastatin (LIPITOR) 80 MG tablet Take 1 tablet (80 mg total) by mouth daily at 6 PM. 08/15/17   Strader, Lennart Pall, PA-C  busPIRone (BUSPAR) 10 MG tablet TAKE 1 TABLET BY MOUTH TWICE A DAY 07/31/17   Bradd Canary, MD  carvedilol (COREG) 12.5 MG tablet TAKE 1 TABLET BY MOUTH TWICE DAILY WITH A MEAL 12/08/16   Duke Salvia, MD  clonazePAM (KLONOPIN) 0.5 MG tablet TAKE 1 TABLET BY MOUTH THREE TIMES A DAY AS NEEDED 04/30/17   Bradd Canary, MD  clopidogrel (PLAVIX) 75 MG tablet Take 1 tablet (75 mg total) by mouth daily. 08/16/17   Strader, Lennart Pall, PA-C  furosemide (LASIX) 40 MG tablet Take 1 tablet (40 mg total) by mouth daily. 02/03/18   Barrett, Joline Salt, PA-C  glipiZIDE (GLUCOTROL) 5 MG tablet TAKE 0.5 TABLETS (2.5 MG TOTAL) BY MOUTH DAILY BEFORE BREAKFAST. 12/29/17   Bradd Canary, MD  losartan (COZAAR) 50 MG tablet Take 1 tablet (50 mg total) by mouth daily. 08/16/17   [place] Ouch, Lennart Pall, PA-C    metFORMIN (GLUCOPHAGE) 500 MG tablet Take 1 tablet (500 mg total) by mouth 2 (two) times daily with a meal. 08/16/17   Strader, [place] M, PA-C  metFORMIN (GLUCOPHAGE) 500 MG tablet TAKE 1 TABLET BY MOUTH TWICE DAILY WITH A MEAL 11/30/17   Bradd Canary, MD  metFORMIN (GLUCOPHAGE) 500 MG tablet TAKE 1 TABLET BY MOUTH TWICE DAILY WITH A MEAL 01/19/18   Bradd Canary, MD  nitroGLYCERIN (NITROSTAT) 0.4 MG SL tablet Place 1 tablet (0.4 mg total) under the tongue every 5 (five) minutes x 3 doses as needed for chest pain. 08/15/17   Strader, Lennart Pall, PA-C  rivaroxaban (XARELTO) 20 MG TABS tablet TAKE 1 TABLET BY MOUTH EVERY DAY WITH SUPPER 02/01/18   Duke Salvia, MD  spironolactone (ALDACTONE) 25 MG tablet TAKE 1/2 TABLET BY MOUTH DAILY 10/12/17   Barrett, Joline Salt, PA-C     Vital Signs: BP (!) 107/50   Pulse 70   Temp (!) 97.5 F (36.4 C) (Axillary)   Resp 16   Ht 5\' 10"  (1.778 m)   Wt 194 lb 3.6 oz (88.1 kg)   SpO2 100%   BMI 27.87 kg/m   Physical Exam Vitals signs and nursing note reviewed.  Constitutional:      General: He is not in acute distress.    Appearance: Normal appearance.     Comments: Intubated and sedated.  Pulmonary:     Effort: Pulmonary effort is normal.  No respiratory distress.     Comments: Intubated and sedated. Skin:    General: Skin is warm and dry.     Comments: Right groin incision soft without active bleeding or hematoma.  Neurological:     Comments: Intubated and sedated. Speech and comprehension not assessed. PERRL bilaterally. EOMs not assessed. Visual fields no assessed Unable to assess facial asymmetry due to ET tube. Unable to assess tongue protrusion due to ET tube. Can spontaneously move left side, RN and family report intermittent movements of RLE, no spontaneous movements of RUE. Pronator drift not assessed. Fine motor and coordination not assessed. Gait not assessed. Romberg not assessed. Heel to toe not assessed. Distal pulses 2+  bilaterally.  Psychiatric:     Comments: Intubated and sedated.     Imaging: Ct Angio Head W Or Wo Contrast  Result Date: 02/15/18 CLINICAL DATA:  Initial evaluation for acute right-sided facial droop. EXAM: CT ANGIOGRAPHY HEAD AND NECK TECHNIQUE: Multidetector CT imaging of the head and neck was performed using the standard protocol during bolus administration of intravenous contrast. Multiplanar CT image reconstructions and MIPs were obtained to evaluate the vascular anatomy. Carotid stenosis measurements (when applicable) are obtained utilizing NASCET criteria, using the distal internal carotid diameter as the denominator. Multiphase CT imaging of the brain was performed following IV bolus contrast injection. Subsequent parametric perfusion maps were calculated using RAPID software. CONTRAST:  ISOVUE-370 IOPAMIDOL (ISOVUE-370) INJECTION 76% COMPARISON:  Prior CT from 08/13/2017 FINDINGS: CT HEAD FINDINGS Brain: Subtle evolving hypodensity within the left insula, adjacent left temporal lobe, and overlying supra ganglionic cortical gray matter, consistent with acute ischemic left MCA territory infarct. Sparing of the basal ganglia and internal capsule at this time. No acute intracranial hemorrhage. No mass lesion, midline shift or mass effect. No hydrocephalus. No extra-axial fluid collection. Atrophy with chronic microvascular disease noted. Vascular: Asymmetric hyperdensity within the distal left M1 segment, suspicious for possible large vessel occlusion. Calcified atherosclerosis at the skull base. Skull: Scalp soft tissues within normal limits.  Calvarium intact. Sinuses/Orbits: Globes and orbital soft tissues within normal limits. Right frontal sinus retention cyst. Paranasal sinuses are otherwise largely clear. No mastoid effusion. Other: 5 ASPECTS (Alberta Stroke Program Early CT Score) - Ganglionic level infarction (caudate, lentiform nuclei, internal capsule, insula, M1-M3 cortex): 5 -  Supraganglionic infarction (M4-M6 cortex): 2 Total score (0-10 with 10 being normal): 7 Review of the MIP images confirms the above findings CTA NECK FINDINGS Aortic arch: Visualized aortic arch of normal caliber with normal branch pattern. Moderate atherosclerotic change about the arch and origin of the great vessels without hemodynamically significant stenosis. Visualized subclavian arteries patent. Right carotid system: Right common carotid artery patent from its origin to the bifurcation without stenosis. Scattered calcified plaque about the right bifurcation/proximal right ICA without hemodynamically significant stenosis. Right ICA widely patent distally to the skull base without stenosis, dissection, or occlusion. Left carotid system: Left common carotid artery patent from its origin to the bifurcation without hemodynamically significant stenosis. Scattered mixed plaque about the left bifurcation/proximal left ICA with resultant stenosis of up to 50% by NASCET criteria. Left ICA patent distally without additional stenosis, dissection, or occlusion. Vertebral arteries: Both of the vertebral arteries arise from the subclavian arteries. Focal plaque at the origin of the left vertebral artery with approximate mild-to-moderate stenosis (estimated 30-50%). Additional scattered plaque within the proximal left V2 segment with moderate stenosis (series 7, image 254). Left vertebral artery otherwise widely patent to the skull base. Right vertebral artery  largely occluded within the neck. Scant distal reconstitution at the right V2/V3 segment via collateralization. Irregular attenuated flow seen within the right vert as it courses into the cranial vault. Skeleton: No acute osseous abnormality. No discrete lytic or blastic osseous lesions. Bulky anterior osteophytic spurring noted throughout the cervical spine. Other neck: No other acute soft tissue abnormality within the neck. Upper chest: Layering secretions noted  within the subglottic trachea just above the carina. Small layering bilateral pleural effusions partially visualized. Associated scattered atelectatic changes noted within the right lung. Review of the MIP images confirms the above findings CTA HEAD FINDINGS Anterior circulation: Petrous segments widely patent bilaterally. Heavy calcified plaque within the cavernous/supraclinoid ICAs bilaterally with up to moderate approximate 50% stenosis, right worse than left. ICA termini perfused. Left A1 patent. Right A1 hypoplastic and/or absent. Normal anterior communicating artery. Anterior cerebral arteries patent to their distal aspects without flow-limiting stenosis. Left M1 patent proximally. Abrupt occlusion of the distal left M1 near the left MCA bifurcation, acute in appearance. Little to no collateral flow seen distally within the left MCA distribution. Right M1 patent to its distal aspect. Normal right MCA bifurcation. Distal right MCA branches well perfused. Posterior circulation: Multifocal scattered plaque within the dominant left multifocal stenosis. Patent left PICA. Scant attenuated flow within the right V4 segment as it courses into the cranial vault. Superimposed atheromatous plaque with moderate to severe stenosis within the right V4 segment prior to the takeoff of the right PICA. Right V4 is patent to the vertebrobasilar junction. Right PICA is perfused proximally. Basilar patent to its distal aspect without stenosis. Superior cerebral arteries patent proximally. Left PCA supplied via the basilar. Predominant fetal type origin of the right PCA supplied via a patent right posterior communicating artery. PCAs patent to their distal aspects without hemodynamically significant stenosis. V4 segment with resultant mild Venous sinuses: Grossly patent, although not well evaluated due to arterial timing of the contrast bolus. Anatomic variants: Fetal type right PCA. Delayed phase: Not performed. Review of the MIP  images confirms the above findings IMPRESSION: CT HEAD IMPRESSION 1. Evolving hypodensity involving the left insula and overlying supra ganglionic left cerebral hemisphere, evolving acute left MCA territory infarct. No intracranial hemorrhage. 2. Aspects = 7. 3. Underlying age-related cerebral atrophy with mild chronic small vessel ischemic disease. CTA HEAD AND NECK IMPRESSION 1. Positive study for emergent large vessel occlusion, with occlusion of the distal left M1 segment. Little to no collateral flow seen distally within the left MCA distribution. 2. Approximate 50% atheromatous stenosis at the proximal left ICA. 3. Occluded vertebral artery within the neck, with irregular attenuated distal reconstitution just prior to the skull base. 4. Parotid carotid siphon atherosclerosis with associated moderate diffuse narrowing, right worse than left. 5. Small layering bilateral pleural effusions, partially visualized. These results were communicated to Dr. Laurence SlateAroor At 9:15 pmon 01/06/2020by text page via the [facility] messaging system. Electronically Signed   By: Rise MuBenjamin  McClintock M.D.   On: 01/13/2018 21:53   Ct Head Wo Contrast  Result Date: 02/05/2018 CLINICAL DATA:  Follow up intracranial hemorrhage after intervention. EXAM: CT HEAD WITHOUT CONTRAST TECHNIQUE: Contiguous axial images were obtained from the base of the skull through the vertex without intravenous contrast. COMPARISON:  CT HEAD February 04, 2018 at 2322 hours. FINDINGS: BRAIN: Increased density LEFT sylvian fissure (previously 5 mm in craniocaudad dimension, now 10 mm) with otherwise similar LEFT extra-axial and inter pedicular cistern densities. Near resolution of LEFT extra-axial pneumocephalus. LEFT frontal lobe (series 3,  image 24) more conspicuous LEFT insular ribbon sign and blurring of LEFT frontoparietal gray-white matter junction. VASCULAR: Moderate to severe calcific atherosclerosis carotid bifurcations. SKULL/SOFT TISSUES: No skull fracture.  No significant soft tissue swelling. ORBITS/SINUSES: The included ocular globes and orbital contents are normal.Mild paranasal sinus mucosal thickening. Mastoid air cells are well aerated. OTHER: Life support lines in place. IMPRESSION: 1. Increased hemorrhage LEFT sylvian fissure density superimposed on extra-axial contrast extravasation and blood. 2. Increased conspicuity of LEFT insular and frontoparietal/MCA infarct. New minimal petechial hemorrhage versus contrast staining. Electronically Signed   By: Awilda Metro M.D.   On: 02/05/2018 03:42   Ct Head Wo Contrast  Result Date: 11-Feb-2018 CLINICAL DATA:  Hemorrhage after thrombectomy. EXAM: CT HEAD WITHOUT CONTRAST TECHNIQUE: Contiguous axial images were obtained from the base of the skull through the vertex without intravenous contrast. COMPARISON:  CT HEAD 02/11/2018 at 2051 hours and CT HEAD 11-Feb-2018 at 2245 hours. FINDINGS: BRAIN: LEFT extra-axial varying density within sylvian fissure, prepontine cistern extending to LEFT frontal parietal sulci. Minimal LEFT extra-axial pneumocephalus. No definite intraparenchymal hemorrhage. Limited assessment for acute infarct due to presence of extra-axial density. Old RIGHT caudate head lacunar infarct. No hydrocephalus. Basal cisterns are patent. VASCULAR: Moderate to severe calcific atherosclerosis of the carotid siphons. SKULL: No skull fracture. No significant scalp soft tissue swelling. SINUSES/ORBITS: Mild paranasal sinus mucosal thickening. Mastoid air cells are well aerated.The included ocular globes and orbital contents are non-suspicious. OTHER: Life support lines in place. IMPRESSION: 1. Similar predominately LEFT extra-axial density compatible with contrast extravasation and hemorrhage with minimal extra-axial pneumocephalus. 2. No definite intraparenchymal hemorrhage. Limited assessment for acute infarct. Electronically Signed   By: Awilda Metro M.D.   On: 2018-02-11 23:37   Ct  Angio Neck W Or Wo Contrast  Result Date: 11-Feb-2018 CLINICAL DATA:  Initial evaluation for acute right-sided facial droop. EXAM: CT ANGIOGRAPHY HEAD AND NECK TECHNIQUE: Multidetector CT imaging of the head and neck was performed using the standard protocol during bolus administration of intravenous contrast. Multiplanar CT image reconstructions and MIPs were obtained to evaluate the vascular anatomy. Carotid stenosis measurements (when applicable) are obtained utilizing NASCET criteria, using the distal internal carotid diameter as the denominator. Multiphase CT imaging of the brain was performed following IV bolus contrast injection. Subsequent parametric perfusion maps were calculated using RAPID software. CONTRAST:  ISOVUE-370 IOPAMIDOL (ISOVUE-370) INJECTION 76% COMPARISON:  Prior CT from 08/13/2017 FINDINGS: CT HEAD FINDINGS Brain: Subtle evolving hypodensity within the left insula, adjacent left temporal lobe, and overlying supra ganglionic cortical gray matter, consistent with acute ischemic left MCA territory infarct. Sparing of the basal ganglia and internal capsule at this time. No acute intracranial hemorrhage. No mass lesion, midline shift or mass effect. No hydrocephalus. No extra-axial fluid collection. Atrophy with chronic microvascular disease noted. Vascular: Asymmetric hyperdensity within the distal left M1 segment, suspicious for possible large vessel occlusion. Calcified atherosclerosis at the skull base. Skull: Scalp soft tissues within normal limits.  Calvarium intact. Sinuses/Orbits: Globes and orbital soft tissues within normal limits. Right frontal sinus retention cyst. Paranasal sinuses are otherwise largely clear. No mastoid effusion. Other: 5 ASPECTS (Alberta Stroke Program Early CT Score) - Ganglionic level infarction (caudate, lentiform nuclei, internal capsule, insula, M1-M3 cortex): 5 - Supraganglionic infarction (M4-M6 cortex): 2 Total score (0-10 with 10 being normal): 7  Review of the MIP images confirms the above findings CTA NECK FINDINGS Aortic arch: Visualized aortic arch of normal caliber with normal branch pattern. Moderate  atherosclerotic change about the arch and origin of the great vessels without hemodynamically significant stenosis. Visualized subclavian arteries patent. Right carotid system: Right common carotid artery patent from its origin to the bifurcation without stenosis. Scattered calcified plaque about the right bifurcation/proximal right ICA without hemodynamically significant stenosis. Right ICA widely patent distally to the skull base without stenosis, dissection, or occlusion. Left carotid system: Left common carotid artery patent from its origin to the bifurcation without hemodynamically significant stenosis. Scattered mixed plaque about the left bifurcation/proximal left ICA with resultant stenosis of up to 50% by NASCET criteria. Left ICA patent distally without additional stenosis, dissection, or occlusion. Vertebral arteries: Both of the vertebral arteries arise from the subclavian arteries. Focal plaque at the origin of the left vertebral artery with approximate mild-to-moderate stenosis (estimated 30-50%). Additional scattered plaque within the proximal left V2 segment with moderate stenosis (series 7, image 254). Left vertebral artery otherwise widely patent to the skull base. Right vertebral artery largely occluded within the neck. Scant distal reconstitution at the right V2/V3 segment via collateralization. Irregular attenuated flow seen within the right vert as it courses into the cranial vault. Skeleton: No acute osseous abnormality. No discrete lytic or blastic osseous lesions. Bulky anterior osteophytic spurring noted throughout the cervical spine. Other neck: No other acute soft tissue abnormality within the neck. Upper chest: Layering secretions noted within the subglottic trachea just above the carina. Small layering bilateral pleural  effusions partially visualized. Associated scattered atelectatic changes noted within the right lung. Review of the MIP images confirms the above findings CTA HEAD FINDINGS Anterior circulation: Petrous segments widely patent bilaterally. Heavy calcified plaque within the cavernous/supraclinoid ICAs bilaterally with up to moderate approximate 50% stenosis, right worse than left. ICA termini perfused. Left A1 patent. Right A1 hypoplastic and/or absent. Normal anterior communicating artery. Anterior cerebral arteries patent to their distal aspects without flow-limiting stenosis. Left M1 patent proximally. Abrupt occlusion of the distal left M1 near the left MCA bifurcation, acute in appearance. Little to no collateral flow seen distally within the left MCA distribution. Right M1 patent to its distal aspect. Normal right MCA bifurcation. Distal right MCA branches well perfused. Posterior circulation: Multifocal scattered plaque within the dominant left multifocal stenosis. Patent left PICA. Scant attenuated flow within the right V4 segment as it courses into the cranial vault. Superimposed atheromatous plaque with moderate to severe stenosis within the right V4 segment prior to the takeoff of the right PICA. Right V4 is patent to the vertebrobasilar junction. Right PICA is perfused proximally. Basilar patent to its distal aspect without stenosis. Superior cerebral arteries patent proximally. Left PCA supplied via the basilar. Predominant fetal type origin of the right PCA supplied via a patent right posterior communicating artery. PCAs patent to their distal aspects without hemodynamically significant stenosis. V4 segment with resultant mild Venous sinuses: Grossly patent, although not well evaluated due to arterial timing of the contrast bolus. Anatomic variants: Fetal type right PCA. Delayed phase: Not performed. Review of the MIP images confirms the above findings IMPRESSION: CT HEAD IMPRESSION 1. Evolving  hypodensity involving the left insula and overlying supra ganglionic left cerebral hemisphere, evolving acute left MCA territory infarct. No intracranial hemorrhage. 2. Aspects = 7. 3. Underlying age-related cerebral atrophy with mild chronic small vessel ischemic disease. CTA HEAD AND NECK IMPRESSION 1. Positive study for emergent large vessel occlusion, with occlusion of the distal left M1 segment. Little to no collateral flow seen distally within the left MCA distribution. 2. Approximate 50% atheromatous stenosis  at the proximal left ICA. 3. Occluded vertebral artery within the neck, with irregular attenuated distal reconstitution just prior to the skull base. 4. Parotid carotid siphon atherosclerosis with associated moderate diffuse narrowing, right worse than left. 5. Small layering bilateral pleural effusions, partially visualized. These results were communicated to Dr. Laurence Slate At 9:15 pmon 2020/02/04by text page via the [facility] messaging system. Electronically Signed   By: Rise Mu M.D.   On: 02-16-18 21:53   Dg Chest Port 1 View  Result Date: 02/05/2018 CLINICAL DATA:  Intubation code stroke.  History of CHF. EXAM: PORTABLE CHEST 1 VIEW COMPARISON:  06/01/2017. FINDINGS: Endotracheal tube tip at the thoracic inlet approximately 6.6 cm above the carina. NG tube tip below left hemidiaphragm. AICD noted with lead over the midline again noted. No change in position. Prior CABG. Cardiomegaly. Very mild interstitial prominence noted. No pleural effusion or pneumothorax. IMPRESSION: 1. Endotracheal tube tip at the thoracic inlet approximately 6.6 cm above the carina distal repositioning should be considered. NG tube tip below left hemidiaphragm. 2. AICD noted stable position with lead over the midline. Prior CABG. Stable cardiomegaly. Mild interstitial prominence noted. Mild CHF can not be excluded. Electronically Signed   By: Maisie Fus  Register   On: 02/05/2018 07:23   Ct Head Code Stroke Wo  Contrast  Result Date: 16-Feb-2018 CLINICAL DATA:  Initial evaluation for acute right-sided facial droop. EXAM: CT ANGIOGRAPHY HEAD AND NECK TECHNIQUE: Multidetector CT imaging of the head and neck was performed using the standard protocol during bolus administration of intravenous contrast. Multiplanar CT image reconstructions and MIPs were obtained to evaluate the vascular anatomy. Carotid stenosis measurements (when applicable) are obtained utilizing NASCET criteria, using the distal internal carotid diameter as the denominator. Multiphase CT imaging of the brain was performed following IV bolus contrast injection. Subsequent parametric perfusion maps were calculated using RAPID software. CONTRAST:  ISOVUE-370 IOPAMIDOL (ISOVUE-370) INJECTION 76% COMPARISON:  Prior CT from 08/13/2017 FINDINGS: CT HEAD FINDINGS Brain: Subtle evolving hypodensity within the left insula, adjacent left temporal lobe, and overlying supra ganglionic cortical gray matter, consistent with acute ischemic left MCA territory infarct. Sparing of the basal ganglia and internal capsule at this time. No acute intracranial hemorrhage. No mass lesion, midline shift or mass effect. No hydrocephalus. No extra-axial fluid collection. Atrophy with chronic microvascular disease noted. Vascular: Asymmetric hyperdensity within the distal left M1 segment, suspicious for possible large vessel occlusion. Calcified atherosclerosis at the skull base. Skull: Scalp soft tissues within normal limits.  Calvarium intact. Sinuses/Orbits: Globes and orbital soft tissues within normal limits. Right frontal sinus retention cyst. Paranasal sinuses are otherwise largely clear. No mastoid effusion. Other: 5 ASPECTS (Alberta Stroke Program Early CT Score) - Ganglionic level infarction (caudate, lentiform nuclei, internal capsule, insula, M1-M3 cortex): 5 - Supraganglionic infarction (M4-M6 cortex): 2 Total score (0-10 with 10 being normal): 7 Review of the MIP  images confirms the above findings CTA NECK FINDINGS Aortic arch: Visualized aortic arch of normal caliber with normal branch pattern. Moderate atherosclerotic change about the arch and origin of the great vessels without hemodynamically significant stenosis. Visualized subclavian arteries patent. Right carotid system: Right common carotid artery patent from its origin to the bifurcation without stenosis. Scattered calcified plaque about the right bifurcation/proximal right ICA without hemodynamically significant stenosis. Right ICA widely patent distally to the skull base without stenosis, dissection, or occlusion. Left carotid system: Left common carotid artery patent from its origin to the bifurcation without hemodynamically significant stenosis. Scattered mixed plaque about the  left bifurcation/proximal left ICA with resultant stenosis of up to 50% by NASCET criteria. Left ICA patent distally without additional stenosis, dissection, or occlusion. Vertebral arteries: Both of the vertebral arteries arise from the subclavian arteries. Focal plaque at the origin of the left vertebral artery with approximate mild-to-moderate stenosis (estimated 30-50%). Additional scattered plaque within the proximal left V2 segment with moderate stenosis (series 7, image 254). Left vertebral artery otherwise widely patent to the skull base. Right vertebral artery largely occluded within the neck. Scant distal reconstitution at the right V2/V3 segment via collateralization. Irregular attenuated flow seen within the right vert as it courses into the cranial vault. Skeleton: No acute osseous abnormality. No discrete lytic or blastic osseous lesions. Bulky anterior osteophytic spurring noted throughout the cervical spine. Other neck: No other acute soft tissue abnormality within the neck. Upper chest: Layering secretions noted within the subglottic trachea just above the carina. Small layering bilateral pleural effusions partially  visualized. Associated scattered atelectatic changes noted within the right lung. Review of the MIP images confirms the above findings CTA HEAD FINDINGS Anterior circulation: Petrous segments widely patent bilaterally. Heavy calcified plaque within the cavernous/supraclinoid ICAs bilaterally with up to moderate approximate 50% stenosis, right worse than left. ICA termini perfused. Left A1 patent. Right A1 hypoplastic and/or absent. Normal anterior communicating artery. Anterior cerebral arteries patent to their distal aspects without flow-limiting stenosis. Left M1 patent proximally. Abrupt occlusion of the distal left M1 near the left MCA bifurcation, acute in appearance. Little to no collateral flow seen distally within the left MCA distribution. Right M1 patent to its distal aspect. Normal right MCA bifurcation. Distal right MCA branches well perfused. Posterior circulation: Multifocal scattered plaque within the dominant left multifocal stenosis. Patent left PICA. Scant attenuated flow within the right V4 segment as it courses into the cranial vault. Superimposed atheromatous plaque with moderate to severe stenosis within the right V4 segment prior to the takeoff of the right PICA. Right V4 is patent to the vertebrobasilar junction. Right PICA is perfused proximally. Basilar patent to its distal aspect without stenosis. Superior cerebral arteries patent proximally. Left PCA supplied via the basilar. Predominant fetal type origin of the right PCA supplied via a patent right posterior communicating artery. PCAs patent to their distal aspects without hemodynamically significant stenosis. V4 segment with resultant mild Venous sinuses: Grossly patent, although not well evaluated due to arterial timing of the contrast bolus. Anatomic variants: Fetal type right PCA. Delayed phase: Not performed. Review of the MIP images confirms the above findings IMPRESSION: CT HEAD IMPRESSION 1. Evolving hypodensity involving the  left insula and overlying supra ganglionic left cerebral hemisphere, evolving acute left MCA territory infarct. No intracranial hemorrhage. 2. Aspects = 7. 3. Underlying age-related cerebral atrophy with mild chronic small vessel ischemic disease. CTA HEAD AND NECK IMPRESSION 1. Positive study for emergent large vessel occlusion, with occlusion of the distal left M1 segment. Little to no collateral flow seen distally within the left MCA distribution. 2. Approximate 50% atheromatous stenosis at the proximal left ICA. 3. Occluded vertebral artery within the neck, with irregular attenuated distal reconstitution just prior to the skull base. 4. Parotid carotid siphon atherosclerosis with associated moderate diffuse narrowing, right worse than left. 5. Small layering bilateral pleural effusions, partially visualized. These results were communicated to Dr. Laurence Slate At 9:15 pmon Jan 27, 2020by text page via the [facility] messaging system. Electronically Signed   By: Rise Mu M.D.   On: 2018/02/08 21:53    Labs:  CBC: Recent Labs  08/15/17 0340 12/01/17 1051 2018-10-07 2045 02/05/18 0452  WBC 7.2 8.0 9.2 13.3*  HGB 13.7 15.0 14.4 13.2  HCT 42.4 45.1 48.8 40.8  PLT 128* 148.0* 138* 135*    COAGS: Recent Labs    08/14/17 0357 08/14/17 1158 2018-10-07 2045  INR  --   --  2.13  APTT 32 39* 38*    BMP: Recent Labs    08/14/17 0357 08/15/17 0340 12/01/17 1051 2018-10-07 2045 2018-10-07 2100 02/05/18 0452  NA 141 139 142 137  --  137  K 5.0 4.3 4.0 4.4  --  4.2  CL 106 106 104 108  --  109  CO2 27 26 28  18*  --  19*  GLUCOSE 120* 109* 92 117*  --  194*  BUN 27* 25* 17 21  --  23  CALCIUM 9.4 9.1 10.0 9.3  --  8.8*  CREATININE 1.36* 1.22 1.31 1.36* 1.40* 1.40*  GFRNONAA 52* 59*  --  53*  --  51*  GFRAA 60* >60  --  >60  --  59*    LIVER FUNCTION TESTS: Recent Labs    08/12/17 1644 12/01/17 1051 2018-10-07 2045  BILITOT 3.2* 1.4* 1.9*  AST 90* 17 22  ALT 28 11 15   ALKPHOS  --  59  57  PROT 6.3 6.4 6.7  ALBUMIN  --  3.9 3.7    Assessment and Plan:  Left MCA M1 segment occlusion s/p attempted mechanical thrombectomy 2018-01-25 by Dr. Corliss Skainseveshwar. Patient's condition stable- remains intubated/sedated, can spontaneously move left side, RN and family report intermittent movements of RLE, no spontaneous movements of RUE. Right groin incision stable. Appreciate and agree with neurology management. IR to follow.   Electronically Signed: Elwin MochaAlexandra Keashia Haskins, PA-C 02/05/2018, 11:07 AM   I spent a total of 25 Minutes at the the patient's bedside AND on the patient's hospital floor or unit, greater than 50% of which was counseling/coordinating care for left MCA M1 segment occlusion.

## 2018-02-05 NOTE — Progress Notes (Signed)
OT Cancellation Note  Patient Details Name: GILEAD ONEAL MRN: [id number] DOB: 1950-05-25   Cancelled Treatment:    Reason Eval/Treat Not Completed: Patient not medically ready.  Pt on vent.   Jeani Hawking, OTR/L Acute Rehabilitation Services Pager [phone] Office [phone]   Jeani Hawking M 02/05/2018, 5:39 AM

## 2018-02-05 NOTE — Progress Notes (Signed)
Contacted MD to clarify multiple blood pressure parameter orders. Per Dr Corliss Skains patients Blood pressure goals are to be 110-140. I will be going by the ART line

## 2018-02-05 NOTE — Progress Notes (Addendum)
STROKE TEAM PROGRESS NOTE   INTERVAL HISTORY His wife ad daughter is at the bedside.  Repeat CT this am with stable SAH and LMCA infarct. Not able to anticoagulated for AF given hmg. sheath out.  Will repeat CT in am unless neuro worsening prior to. Extubate if able. No MRI d/t AICD.  Dr. Pearlean Brownie had long discussion with wife and daughter related to expected poor neurologic outcome.The patient's wife confirmed that patient likely missed 2 doses of Xarelto  Vitals:   02/05/18 0700 02/05/18 0800 02/05/18 0835 02/05/18 0841  BP: 101/64 115/70 (!) 107/50   Pulse: 72 70 70   Resp: 16 14 16    Temp:    (!) 97.5 F (36.4 C)  TempSrc:    Axillary  SpO2: 100% 100% 100% 100%  Weight:      Height:        CBC:  Recent Labs  Lab 01/13/2018 2045 02/05/18 0452  WBC 9.2 13.3*  NEUTROABS 7.0 12.4*  HGB 14.4 13.2  HCT 48.8 40.8  MCV 107.7* 102.5*  PLT 138* 135*    Basic Metabolic Panel:  Recent Labs  Lab 02/06/2018 2045 01/25/2018 2100 02/05/18 0452  NA 137  --  137  K 4.4  --  4.2  CL 108  --  109  CO2 18*  --  19*  GLUCOSE 117*  --  194*  BUN 21  --  23  CREATININE 1.36* 1.40* 1.40*  CALCIUM 9.3  --  8.8*   Lipid Panel:     Component Value Date/Time   CHOL 130 02/05/2018 0452   TRIG 148 02/05/2018 0452   TRIG 149 02/05/2018 0452   HDL 32 (L) 02/05/2018 0452   CHOLHDL 4.1 02/05/2018 0452   VLDL 30 02/05/2018 0452   LDLCALC 68 02/05/2018 0452   HgbA1c:  Lab Results  Component Value Date   HGBA1C 5.1 02/05/2018   Urine Drug Screen:     Component Value Date/Time   LABOPIA NONE DETECTED 01/29/2017 2327   COCAINSCRNUR NONE DETECTED 01/29/2017 2327   COCAINSCRNUR NEG 11/12/2012 1648   LABBENZ NONE DETECTED 01/29/2017 2327   LABBENZ NEG 11/12/2012 1648   AMPHETMU NONE DETECTED 01/29/2017 2327   THCU NONE DETECTED 01/29/2017 2327   LABBARB NONE DETECTED 01/29/2017 2327    Alcohol Level     Component Value Date/Time   ETH <10 08/23/2010 1551    IMAGING Ct Angio Head W  Or Wo Contrast  Result Date: 01/20/2018 CLINICAL DATA:  Initial evaluation for acute right-sided facial droop. EXAM: CT ANGIOGRAPHY HEAD AND NECK TECHNIQUE: Multidetector CT imaging of the head and neck was performed using the standard protocol during bolus administration of intravenous contrast. Multiplanar CT image reconstructions and MIPs were obtained to evaluate the vascular anatomy. Carotid stenosis measurements (when applicable) are obtained utilizing NASCET criteria, using the distal internal carotid diameter as the denominator. Multiphase CT imaging of the brain was performed following IV bolus contrast injection. Subsequent parametric perfusion maps were calculated using RAPID software. CONTRAST:  ISOVUE-370 IOPAMIDOL (ISOVUE-370) INJECTION 76% COMPARISON:  Prior CT from 08/13/2017 FINDINGS: CT HEAD FINDINGS Brain: Subtle evolving hypodensity within the left insula, adjacent left temporal lobe, and overlying supra ganglionic cortical gray matter, consistent with acute ischemic left MCA territory infarct. Sparing of the basal ganglia and internal capsule at this time. No acute intracranial hemorrhage. No mass lesion, midline shift or mass effect. No hydrocephalus. No extra-axial fluid collection. Atrophy with chronic microvascular disease noted. Vascular: Asymmetric hyperdensity within the  distal left M1 segment, suspicious for possible large vessel occlusion. Calcified atherosclerosis at the skull base. Skull: Scalp soft tissues within normal limits.  Calvarium intact. Sinuses/Orbits: Globes and orbital soft tissues within normal limits. Right frontal sinus retention cyst. Paranasal sinuses are otherwise largely clear. No mastoid effusion. Other: 5 ASPECTS (Alberta Stroke Program Early CT Score) - Ganglionic level infarction (caudate, lentiform nuclei, internal capsule, insula, M1-M3 cortex): 5 - Supraganglionic infarction (M4-M6 cortex): 2 Total score (0-10 with 10 being normal): 7 Review of the  MIP images confirms the above findings CTA NECK FINDINGS Aortic arch: Visualized aortic arch of normal caliber with normal branch pattern. Moderate atherosclerotic change about the arch and origin of the great vessels without hemodynamically significant stenosis. Visualized subclavian arteries patent. Right carotid system: Right common carotid artery patent from its origin to the bifurcation without stenosis. Scattered calcified plaque about the right bifurcation/proximal right ICA without hemodynamically significant stenosis. Right ICA widely patent distally to the skull base without stenosis, dissection, or occlusion. Left carotid system: Left common carotid artery patent from its origin to the bifurcation without hemodynamically significant stenosis. Scattered mixed plaque about the left bifurcation/proximal left ICA with resultant stenosis of up to 50% by NASCET criteria. Left ICA patent distally without additional stenosis, dissection, or occlusion. Vertebral arteries: Both of the vertebral arteries arise from the subclavian arteries. Focal plaque at the origin of the left vertebral artery with approximate mild-to-moderate stenosis (estimated 30-50%). Additional scattered plaque within the proximal left V2 segment with moderate stenosis (series 7, image 254). Left vertebral artery otherwise widely patent to the skull base. Right vertebral artery largely occluded within the neck. Scant distal reconstitution at the right V2/V3 segment via collateralization. Irregular attenuated flow seen within the right vert as it courses into the cranial vault. Skeleton: No acute osseous abnormality. No discrete lytic or blastic osseous lesions. Bulky anterior osteophytic spurring noted throughout the cervical spine. Other neck: No other acute soft tissue abnormality within the neck. Upper chest: Layering secretions noted within the subglottic trachea just above the carina. Small layering bilateral pleural effusions partially  visualized. Associated scattered atelectatic changes noted within the right lung. Review of the MIP images confirms the above findings CTA HEAD FINDINGS Anterior circulation: Petrous segments widely patent bilaterally. Heavy calcified plaque within the cavernous/supraclinoid ICAs bilaterally with up to moderate approximate 50% stenosis, right worse than left. ICA termini perfused. Left A1 patent. Right A1 hypoplastic and/or absent. Normal anterior communicating artery. Anterior cerebral arteries patent to their distal aspects without flow-limiting stenosis. Left M1 patent proximally. Abrupt occlusion of the distal left M1 near the left MCA bifurcation, acute in appearance. Little to no collateral flow seen distally within the left MCA distribution. Right M1 patent to its distal aspect. Normal right MCA bifurcation. Distal right MCA branches well perfused. Posterior circulation: Multifocal scattered plaque within the dominant left multifocal stenosis. Patent left PICA. Scant attenuated flow within the right V4 segment as it courses into the cranial vault. Superimposed atheromatous plaque with moderate to severe stenosis within the right V4 segment prior to the takeoff of the right PICA. Right V4 is patent to the vertebrobasilar junction. Right PICA is perfused proximally. Basilar patent to its distal aspect without stenosis. Superior cerebral arteries patent proximally. Left PCA supplied via the basilar. Predominant fetal type origin of the right PCA supplied via a patent right posterior communicating artery. PCAs patent to their distal aspects without hemodynamically significant stenosis. V4 segment with resultant mild Venous sinuses: Grossly patent, although not  well evaluated due to arterial timing of the contrast bolus. Anatomic variants: Fetal type right PCA. Delayed phase: Not performed. Review of the MIP images confirms the above findings IMPRESSION: CT HEAD IMPRESSION 1. Evolving hypodensity involving the  left insula and overlying supra ganglionic left cerebral hemisphere, evolving acute left MCA territory infarct. No intracranial hemorrhage. 2. Aspects = 7. 3. Underlying age-related cerebral atrophy with mild chronic small vessel ischemic disease. CTA HEAD AND NECK IMPRESSION 1. Positive study for emergent large vessel occlusion, with occlusion of the distal left M1 segment. Little to no collateral flow seen distally within the left MCA distribution. 2. Approximate 50% atheromatous stenosis at the proximal left ICA. 3. Occluded vertebral artery within the neck, with irregular attenuated distal reconstitution just prior to the skull base. 4. Parotid carotid siphon atherosclerosis with associated moderate diffuse narrowing, right worse than left. 5. Small layering bilateral pleural effusions, partially visualized. These results were communicated to Dr. Laurence Slate At 9:15 pmon 01/21/2020by text page via the [facility] messaging system. Electronically Signed   By: Rise Mu M.D.   On: 01/20/2018 21:53   Ct Head Wo Contrast  Result Date: 02/05/2018 CLINICAL DATA:  Follow up intracranial hemorrhage after intervention. EXAM: CT HEAD WITHOUT CONTRAST TECHNIQUE: Contiguous axial images were obtained from the base of the skull through the vertex without intravenous contrast. COMPARISON:  CT HEAD February 04, 2018 at 2322 hours. FINDINGS: BRAIN: Increased density LEFT sylvian fissure (previously 5 mm in craniocaudad dimension, now 10 mm) with otherwise similar LEFT extra-axial and inter pedicular cistern densities. Near resolution of LEFT extra-axial pneumocephalus. LEFT frontal lobe (series 3, image 24) more conspicuous LEFT insular ribbon sign and blurring of LEFT frontoparietal gray-white matter junction. VASCULAR: Moderate to severe calcific atherosclerosis carotid bifurcations. SKULL/SOFT TISSUES: No skull fracture. No significant soft tissue swelling. ORBITS/SINUSES: The included ocular globes and orbital contents are  normal.Mild paranasal sinus mucosal thickening. Mastoid air cells are well aerated. OTHER: Life support lines in place. IMPRESSION: 1. Increased hemorrhage LEFT sylvian fissure density superimposed on extra-axial contrast extravasation and blood. 2. Increased conspicuity of LEFT insular and frontoparietal/MCA infarct. New minimal petechial hemorrhage versus contrast staining. Electronically Signed   By: Awilda Metro M.D.   On: 02/05/2018 03:42   Ct Head Wo Contrast  Result Date: 02/12/2018 CLINICAL DATA:  Hemorrhage after thrombectomy. EXAM: CT HEAD WITHOUT CONTRAST TECHNIQUE: Contiguous axial images were obtained from the base of the skull through the vertex without intravenous contrast. COMPARISON:  CT HEAD February 04, 2018 at 2051 hours and CT HEAD February 04, 2018 at 2245 hours. FINDINGS: BRAIN: LEFT extra-axial varying density within sylvian fissure, prepontine cistern extending to LEFT frontal parietal sulci. Minimal LEFT extra-axial pneumocephalus. No definite intraparenchymal hemorrhage. Limited assessment for acute infarct due to presence of extra-axial density. Old RIGHT caudate head lacunar infarct. No hydrocephalus. Basal cisterns are patent. VASCULAR: Moderate to severe calcific atherosclerosis of the carotid siphons. SKULL: No skull fracture. No significant scalp soft tissue swelling. SINUSES/ORBITS: Mild paranasal sinus mucosal thickening. Mastoid air cells are well aerated.The included ocular globes and orbital contents are non-suspicious. OTHER: Life support lines in place. IMPRESSION: 1. Similar predominately LEFT extra-axial density compatible with contrast extravasation and hemorrhage with minimal extra-axial pneumocephalus. 2. No definite intraparenchymal hemorrhage. Limited assessment for acute infarct. Electronically Signed   By: Awilda Metro M.D.   On: 01/16/2018 23:37   Ct Angio Neck W Or Wo Contrast  Result Date: 01/16/2018 CLINICAL DATA:  Initial evaluation for acute  right-sided facial droop.  EXAM: CT ANGIOGRAPHY HEAD AND NECK TECHNIQUE: Multidetector CT imaging of the head and neck was performed using the standard protocol during bolus administration of intravenous contrast. Multiplanar CT image reconstructions and MIPs were obtained to evaluate the vascular anatomy. Carotid stenosis measurements (when applicable) are obtained utilizing NASCET criteria, using the distal internal carotid diameter as the denominator. Multiphase CT imaging of the brain was performed following IV bolus contrast injection. Subsequent parametric perfusion maps were calculated using RAPID software. CONTRAST:  ISOVUE-370 IOPAMIDOL (ISOVUE-370) INJECTION 76% COMPARISON:  Prior CT from 08/13/2017 FINDINGS: CT HEAD FINDINGS Brain: Subtle evolving hypodensity within the left insula, adjacent left temporal lobe, and overlying supra ganglionic cortical gray matter, consistent with acute ischemic left MCA territory infarct. Sparing of the basal ganglia and internal capsule at this time. No acute intracranial hemorrhage. No mass lesion, midline shift or mass effect. No hydrocephalus. No extra-axial fluid collection. Atrophy with chronic microvascular disease noted. Vascular: Asymmetric hyperdensity within the distal left M1 segment, suspicious for possible large vessel occlusion. Calcified atherosclerosis at the skull base. Skull: Scalp soft tissues within normal limits.  Calvarium intact. Sinuses/Orbits: Globes and orbital soft tissues within normal limits. Right frontal sinus retention cyst. Paranasal sinuses are otherwise largely clear. No mastoid effusion. Other: 5 ASPECTS (Alberta Stroke Program Early CT Score) - Ganglionic level infarction (caudate, lentiform nuclei, internal capsule, insula, M1-M3 cortex): 5 - Supraganglionic infarction (M4-M6 cortex): 2 Total score (0-10 with 10 being normal): 7 Review of the MIP images confirms the above findings CTA NECK FINDINGS Aortic arch: Visualized aortic  arch of normal caliber with normal branch pattern. Moderate atherosclerotic change about the arch and origin of the great vessels without hemodynamically significant stenosis. Visualized subclavian arteries patent. Right carotid system: Right common carotid artery patent from its origin to the bifurcation without stenosis. Scattered calcified plaque about the right bifurcation/proximal right ICA without hemodynamically significant stenosis. Right ICA widely patent distally to the skull base without stenosis, dissection, or occlusion. Left carotid system: Left common carotid artery patent from its origin to the bifurcation without hemodynamically significant stenosis. Scattered mixed plaque about the left bifurcation/proximal left ICA with resultant stenosis of up to 50% by NASCET criteria. Left ICA patent distally without additional stenosis, dissection, or occlusion. Vertebral arteries: Both of the vertebral arteries arise from the subclavian arteries. Focal plaque at the origin of the left vertebral artery with approximate mild-to-moderate stenosis (estimated 30-50%). Additional scattered plaque within the proximal left V2 segment with moderate stenosis (series 7, image 254). Left vertebral artery otherwise widely patent to the skull base. Right vertebral artery largely occluded within the neck. Scant distal reconstitution at the right V2/V3 segment via collateralization. Irregular attenuated flow seen within the right vert as it courses into the cranial vault. Skeleton: No acute osseous abnormality. No discrete lytic or blastic osseous lesions. Bulky anterior osteophytic spurring noted throughout the cervical spine. Other neck: No other acute soft tissue abnormality within the neck. Upper chest: Layering secretions noted within the subglottic trachea just above the carina. Small layering bilateral pleural effusions partially visualized. Associated scattered atelectatic changes noted within the right lung. Review  of the MIP images confirms the above findings CTA HEAD FINDINGS Anterior circulation: Petrous segments widely patent bilaterally. Heavy calcified plaque within the cavernous/supraclinoid ICAs bilaterally with up to moderate approximate 50% stenosis, right worse than left. ICA termini perfused. Left A1 patent. Right A1 hypoplastic and/or absent. Normal anterior communicating artery. Anterior cerebral arteries patent to their distal aspects without flow-limiting stenosis. Left  M1 patent proximally. Abrupt occlusion of the distal left M1 near the left MCA bifurcation, acute in appearance. Little to no collateral flow seen distally within the left MCA distribution. Right M1 patent to its distal aspect. Normal right MCA bifurcation. Distal right MCA branches well perfused. Posterior circulation: Multifocal scattered plaque within the dominant left multifocal stenosis. Patent left PICA. Scant attenuated flow within the right V4 segment as it courses into the cranial vault. Superimposed atheromatous plaque with moderate to severe stenosis within the right V4 segment prior to the takeoff of the right PICA. Right V4 is patent to the vertebrobasilar junction. Right PICA is perfused proximally. Basilar patent to its distal aspect without stenosis. Superior cerebral arteries patent proximally. Left PCA supplied via the basilar. Predominant fetal type origin of the right PCA supplied via a patent right posterior communicating artery. PCAs patent to their distal aspects without hemodynamically significant stenosis. V4 segment with resultant mild Venous sinuses: Grossly patent, although not well evaluated due to arterial timing of the contrast bolus. Anatomic variants: Fetal type right PCA. Delayed phase: Not performed. Review of the MIP images confirms the above findings IMPRESSION: CT HEAD IMPRESSION 1. Evolving hypodensity involving the left insula and overlying supra ganglionic left cerebral hemisphere, evolving acute left MCA  territory infarct. No intracranial hemorrhage. 2. Aspects = 7. 3. Underlying age-related cerebral atrophy with mild chronic small vessel ischemic disease. CTA HEAD AND NECK IMPRESSION 1. Positive study for emergent large vessel occlusion, with occlusion of the distal left M1 segment. Little to no collateral flow seen distally within the left MCA distribution. 2. Approximate 50% atheromatous stenosis at the proximal left ICA. 3. Occluded vertebral artery within the neck, with irregular attenuated distal reconstitution just prior to the skull base. 4. Parotid carotid siphon atherosclerosis with associated moderate diffuse narrowing, right worse than left. 5. Small layering bilateral pleural effusions, partially visualized. These results were communicated to Dr. Laurence Slate At 9:15 pmon 01/07/2020by text page via the [facility] messaging system. Electronically Signed   By: Rise Mu M.D.   On: 01/24/2018 21:53   Dg Chest Port 1 View  Result Date: 02/05/2018 CLINICAL DATA:  Intubation code stroke.  History of CHF. EXAM: PORTABLE CHEST 1 VIEW COMPARISON:  06/01/2017. FINDINGS: Endotracheal tube tip at the thoracic inlet approximately 6.6 cm above the carina. NG tube tip below left hemidiaphragm. AICD noted with lead over the midline again noted. No change in position. Prior CABG. Cardiomegaly. Very mild interstitial prominence noted. No pleural effusion or pneumothorax. IMPRESSION: 1. Endotracheal tube tip at the thoracic inlet approximately 6.6 cm above the carina distal repositioning should be considered. NG tube tip below left hemidiaphragm. 2. AICD noted stable position with lead over the midline. Prior CABG. Stable cardiomegaly. Mild interstitial prominence noted. Mild CHF can not be excluded. Electronically Signed   By: Maisie Fus  Register   On: 02/05/2018 07:23   Ct Head Code Stroke Wo Contrast  Result Date: 02/01/2018 CLINICAL DATA:  Initial evaluation for acute right-sided facial droop. EXAM: CT ANGIOGRAPHY  HEAD AND NECK TECHNIQUE: Multidetector CT imaging of the head and neck was performed using the standard protocol during bolus administration of intravenous contrast. Multiplanar CT image reconstructions and MIPs were obtained to evaluate the vascular anatomy. Carotid stenosis measurements (when applicable) are obtained utilizing NASCET criteria, using the distal internal carotid diameter as the denominator. Multiphase CT imaging of the brain was performed following IV bolus contrast injection. Subsequent parametric perfusion maps were calculated using RAPID software. CONTRAST:  ISOVUE-370  IOPAMIDOL (ISOVUE-370) INJECTION 76% COMPARISON:  Prior CT from 08/13/2017 FINDINGS: CT HEAD FINDINGS Brain: Subtle evolving hypodensity within the left insula, adjacent left temporal lobe, and overlying supra ganglionic cortical gray matter, consistent with acute ischemic left MCA territory infarct. Sparing of the basal ganglia and internal capsule at this time. No acute intracranial hemorrhage. No mass lesion, midline shift or mass effect. No hydrocephalus. No extra-axial fluid collection. Atrophy with chronic microvascular disease noted. Vascular: Asymmetric hyperdensity within the distal left M1 segment, suspicious for possible large vessel occlusion. Calcified atherosclerosis at the skull base. Skull: Scalp soft tissues within normal limits.  Calvarium intact. Sinuses/Orbits: Globes and orbital soft tissues within normal limits. Right frontal sinus retention cyst. Paranasal sinuses are otherwise largely clear. No mastoid effusion. Other: 5 ASPECTS (Alberta Stroke Program Early CT Score) - Ganglionic level infarction (caudate, lentiform nuclei, internal capsule, insula, M1-M3 cortex): 5 - Supraganglionic infarction (M4-M6 cortex): 2 Total score (0-10 with 10 being normal): 7 Review of the MIP images confirms the above findings CTA NECK FINDINGS Aortic arch: Visualized aortic arch of normal caliber with normal branch  pattern. Moderate atherosclerotic change about the arch and origin of the great vessels without hemodynamically significant stenosis. Visualized subclavian arteries patent. Right carotid system: Right common carotid artery patent from its origin to the bifurcation without stenosis. Scattered calcified plaque about the right bifurcation/proximal right ICA without hemodynamically significant stenosis. Right ICA widely patent distally to the skull base without stenosis, dissection, or occlusion. Left carotid system: Left common carotid artery patent from its origin to the bifurcation without hemodynamically significant stenosis. Scattered mixed plaque about the left bifurcation/proximal left ICA with resultant stenosis of up to 50% by NASCET criteria. Left ICA patent distally without additional stenosis, dissection, or occlusion. Vertebral arteries: Both of the vertebral arteries arise from the subclavian arteries. Focal plaque at the origin of the left vertebral artery with approximate mild-to-moderate stenosis (estimated 30-50%). Additional scattered plaque within the proximal left V2 segment with moderate stenosis (series 7, image 254). Left vertebral artery otherwise widely patent to the skull base. Right vertebral artery largely occluded within the neck. Scant distal reconstitution at the right V2/V3 segment via collateralization. Irregular attenuated flow seen within the right vert as it courses into the cranial vault. Skeleton: No acute osseous abnormality. No discrete lytic or blastic osseous lesions. Bulky anterior osteophytic spurring noted throughout the cervical spine. Other neck: No other acute soft tissue abnormality within the neck. Upper chest: Layering secretions noted within the subglottic trachea just above the carina. Small layering bilateral pleural effusions partially visualized. Associated scattered atelectatic changes noted within the right lung. Review of the MIP images confirms the above  findings CTA HEAD FINDINGS Anterior circulation: Petrous segments widely patent bilaterally. Heavy calcified plaque within the cavernous/supraclinoid ICAs bilaterally with up to moderate approximate 50% stenosis, right worse than left. ICA termini perfused. Left A1 patent. Right A1 hypoplastic and/or absent. Normal anterior communicating artery. Anterior cerebral arteries patent to their distal aspects without flow-limiting stenosis. Left M1 patent proximally. Abrupt occlusion of the distal left M1 near the left MCA bifurcation, acute in appearance. Little to no collateral flow seen distally within the left MCA distribution. Right M1 patent to its distal aspect. Normal right MCA bifurcation. Distal right MCA branches well perfused. Posterior circulation: Multifocal scattered plaque within the dominant left multifocal stenosis. Patent left PICA. Scant attenuated flow within the right V4 segment as it courses into the cranial vault. Superimposed atheromatous plaque with moderate to severe stenosis within  the right V4 segment prior to the takeoff of the right PICA. Right V4 is patent to the vertebrobasilar junction. Right PICA is perfused proximally. Basilar patent to its distal aspect without stenosis. Superior cerebral arteries patent proximally. Left PCA supplied via the basilar. Predominant fetal type origin of the right PCA supplied via a patent right posterior communicating artery. PCAs patent to their distal aspects without hemodynamically significant stenosis. V4 segment with resultant mild Venous sinuses: Grossly patent, although not well evaluated due to arterial timing of the contrast bolus. Anatomic variants: Fetal type right PCA. Delayed phase: Not performed. Review of the MIP images confirms the above findings IMPRESSION: CT HEAD IMPRESSION 1. Evolving hypodensity involving the left insula and overlying supra ganglionic left cerebral hemisphere, evolving acute left MCA territory infarct. No intracranial  hemorrhage. 2. Aspects = 7. 3. Underlying age-related cerebral atrophy with mild chronic small vessel ischemic disease. CTA HEAD AND NECK IMPRESSION 1. Positive study for emergent large vessel occlusion, with occlusion of the distal left M1 segment. Little to no collateral flow seen distally within the left MCA distribution. 2. Approximate 50% atheromatous stenosis at the proximal left ICA. 3. Occluded vertebral artery within the neck, with irregular attenuated distal reconstitution just prior to the skull base. 4. Parotid carotid siphon atherosclerosis with associated moderate diffuse narrowing, right worse than left. 5. Small layering bilateral pleural effusions, partially visualized. These results were communicated to Dr. Laurence Slate At 9:15 pmon 2020-02-03by text page via the [facility] messaging system. Electronically Signed   By: Rise Mu M.D.   On: 15-Feb-2018 21:53   Cerebral angiogram 02-15-2018 Lt MCA M1 occlusion with early recanalization.procedure aborted after extravasation of contrast noted following deployment of  A 4mm x 40 mm solitaire device associated with severe spasm. NO changes noted in BP or HR. Device recaptured into microcath and retrieved . immediate CT brain demonstrates contrast in the Lt perisylvian fissure and Lt temporal cerebral convexity. No mass effect or midline shift noted . No intraparenchymal  hemorrhage noted.  2D echocardiogram 15-Feb-2018 - Left ventricle: The cavity size was moderately dilated. Wall thickness was increased in a pattern of mild LVH. Systolic function was severely reduced. The estimated ejection fraction was in the range of 20% to 25%. Diffuse hypokinesis. - Mitral valve: Calcified annulus. There was mild regurgitation. - Left atrium: The atrium was severely dilated. - Right ventricle: The cavity size was mildly dilated. Systolic function was moderately reduced. - Right atrium: The atrium was moderately dilated. Impressions:  Definity used; severe  global reduction in LV systolic function; mild LVH; 4 chamber enlargement; mild MR; moderate RV dysfunction.   PHYSICAL EXAM Middle-aged Caucasian male who is sedated and intubated. Not in distress. . Afebrile. Head is nontraumatic. Neck is supple without bruit.    Cardiac exam no murmur or gallop. Lungs are clear to auscultation. Distal pulses are well felt. Neurological Exam :  Patient is sedated and intubated. Eyes are closed. His globally aphasic and mute and not following any commands. He has left gaze deviation. Does not move the eyes to the right even with doll's eye movements. Does not blink to threat on either side. Pupils are 3 mm equal reactive. Fundi were not visualized. Moderate right lower facial weakness. Tongue midline. He has a cough and gag. Motor system exam reveals spontaneous left upper and lower extremity movements against gravity. Dense right hemiplegia with trace withdrawal in the right lower extremity and no withdrawal in the right upper extremity to pain. Hypotonia on the right.  Right plantar upgoing left downgoing.  ASSESSMENT/PLAN Larry Rangel is a 68 y.o. male with history of CAD s/p CABG, systolic and diastolic CHF w/ ET 30-35%, AICD, HTN, HLD, AF on xarelto, DM presenting with aphasia and right-sided weakness.   Stroke:  left MCA infarct d/t L M1 occlusion s/p aborted IR d/t hemorrhage at time of device deployment, now w/ cerebral edema, subarachnoid hemorrhage. Infarct  Embolic secondary to known AF in setting of missed Xarelto doses  Code Stroke CT head evolving L insular/MCA territory infarct. Small vessel disease. Atrophy. ASPECTS 7    CTA head & neck ELVO L M1 w/o collaterals. L ICA 50%. Occluded VA in the neck. R>L ICA siphon atherosclerosis. Small layering B pleural effusion  Cerebral angio left MCA M1 occlusion with recanalization aborted after extravasation of contrast after Solitaire deployment for severe spasm  CT head 1/23 similar predominately  large L extra-axial density c/w contrast extravasation. No definite hmg. limited ability to assess infarct  CT head 1/24 increased hgm L sylvian fissure superimposed on extra-axial contrast extravasation and blood. Now able to see L insular and FP/MCA infarct. New petechial hmg vs contrast staining  Repeat CT head in am   2D Echo EF 20 to 25% with diffuse hypokinesis.  Left atrium severely dilated.  Right atrium moderately dilated.  LDL 68  HgbA1c 5.1  SCDs for VTE prophylaxis  aspirin 81 mg daily, clopidogrel 75 mg daily and Xarelto (rivaroxaban) daily prior to admission (though wife thinks he missed 1-2 doses xarelto PTA), now on No antithrombotic given hemorrhage   Therapy recommendations:  pending   Disposition:  pending   Dr. Pearlean BrownieSethi had long discussion w/ wife and dtr r/t severe stroke and expected poor neuro outcome. Family understands. Wants to discuss amongst themselves. For now, aggressive treatment. Discussed with CCM who will readdress with them as well  Cerebral Edema Induced Hypernatremia  For now, Start 3% via PIV  Consulted CCM for central line  Transition 3% to CL once placed  Goal Na 150-155  Check Na q 6h  Acute Respiratory Failure  Intubated  CCM onboard  Atrial Fibrillation  Home anticoagulation:  Xarelto (rivaroxaban) daily , likely missed 1-2 doses prior to admission  For now, no AC given cerebral hemorrhage  Hypertension  Stable . Given hemorrhage, goal SBP < 140  Hyperlipidemia  Home meds:  lipitor 80  Statin on hold given hemorrhage  LDL 68, goal < 70  Continue statin at discharge  Diabetes type II  HgbA1c 5.1, at goal < 7.0  Dysphagia, secondary to acute stroke   N.p.o.  Other Stroke Risk Factors  Advanced age  Former Cigarette smoker, quit 7 years ago  Coronary artery disease s/p CABG  Chronic systolic and diastolic Congestive heart failure w/ EF 30-35%, AiCD  Other Active Problems  Leukocytosis,  WBC13.3  Thrombocytopenia, PLT 135  AKI, CR1.40  Hospital day # 1  Annie MainSharon Biby, MSN, APRN, ANVP-BC, AGPCNP-BC Advanced Practice Stroke Nurse [facility] See Amion for Schedule & Pager information 02/05/2018 10:17 AM  I have personally obtained history,examined this patient, reviewed notes, independently viewed imaging studies, participated in medical decision making and plan of care.ROS completed by me personally and pertinent positives fully documented  I have made any additions or clarifications directly to the above note. Agree with note above.  He presented with global aphasia and right hemiplegia due to left middle cerebral artery occlusion likely from atrial fibrillation and having missed 2 doses of Xarelto.  Mechanical thrombectomy was on unsuccessful and he developed focal subarachnoid hemorrhage and follow-up CT scan shows cytotoxic edema and large MCA infarct. He is at risk for significant worsening of cerebral edema and brain herniation. Recommend start hypertonic saline with serum sodium goal 150-155. Continue mechanical ventilation. Check follow-up CT scan tomorrow morning or earlier if there is neurological worsening. Strict blood pressure control and keep systolic below 140. Prognosis is poor. Long discussion with patient and daughter the bedside and answered questions. Discussed with Dr. Corliss Skains and Dr. Tonia Brooms. This patient is critically ill and at significant risk of neurological worsening, death and care requires constant monitoring of vital signs, hemodynamics,respiratory and cardiac monitoring, extensive review of multiple databases, frequent neurological assessment, discussion with family, other specialists and medical decision making of high complexity.I have made any additions or clarifications directly to the above note.This critical care time does not reflect procedure time, or teaching time or supervisory time of PA/NP/Med Resident etc but could involve care  discussion time.  I spent 40 minutes of neurocritical care time  in the care of  this patient.     Delia Heady, MD Medical Director [facility] Pager: [phone] 02/05/2018 3:48 PM  To contact Stroke Continuity provider, please refer to [email or web address]. After hours, contact General Neurology

## 2018-02-05 NOTE — H&P (Signed)
Chief Complaint:  Aphasia and right side weakness  History obtained from: Patient and Chart  HPI:                                                                                                                                       Larry Rangel is an 68 y.o. male last medical history of coronary artery disease status post CABG, systolic and diastolic congestive heart failure with ejection fraction of 30 to 35%, ICD placement, hypertension, hyperlipidemia and atrial fibrillation on Xarelto, diabetes mellitus presents to the emergency room as a code stroke after his coworkers found him not speaking that started around 7:55 PM while at work.  On arrival patient had left gaze deviation, was nonverbal and weak on the right side.  Stat CT head was obtained which showed early left MCA ischemic changes with a CT aspects of 7.  CT angiogram was performed which showed a left M1 occlusion.  Patient was taken to IR for mechanical thrombectomy.  tPA was not offered because patient is on Xarelto.  Later his wife confirmed that he may have missed this morning's dose and possibly yesterday's dose of Xarelto.  Date last known well: 1.23.20 Time last known well: 7.55 p tPA Given: no, on Xarelto ( missed dose this morning) NIHSS: 22 Baseline MRS 0  Past Medical History:  Diagnosis Date  . Abnormal liver function 08/23/2010  . AICD (automatic cardioverter/defibrillator) present   . Anxiety   . Arthritis    "head to toe" (01/29/2017)  . CAD (coronary artery disease)    a. s/p CABG 2009. b. Cath 07/2013: 3/5 patent grafts (appear to have chronic occ grafts), mod diag/L PL branch, did not appear flow limiting, elevated filling pressures.  . CHF (congestive heart failure) ([place]) 07/30/2013  . Chicken pox as a child  . Chronic combined systolic and diastolic CHF (congestive heart failure) ([place])    a. Echo 6/14: Mild LVH, EF 30-35%, diffuse HK, MAC, mild BAE. b. Drop in EF to 20% by echo 07/2013.  Marland Kitchen  Hyperlipidemia   . Hypertension   . LBBB (left bundle branch block)   . Low back pain   . Measles as a child  . Mumps as a child  . Myocardial infarction ([place]) 2009  . Paroxysmal atrial flutter ([place]) 07-2012   a. s/p ablation by Dr [name] 08-06-2012. b. Recurrence 07/2013.   Marland Kitchen Preventative health care 12/10/2014  . Tobacco user   . Type II diabetes mellitus ([place])     Past Surgical History:  Procedure Laterality Date  . ATRIAL FLUTTER ABLATION N/A 08/06/2012   Procedure: ATRIAL FLUTTER ABLATION;  Surgeon: Deboraha Sprang, MD;  Location: [facility] CATH LAB;  Service: Cardiovascular;  Laterality: N/A;  . CARDIAC CATHETERIZATION  2009  . CARDIOVERSION N/A 07/15/2012   Procedure: CARDIOVERSION;  Surgeon: Thayer Headings, MD;  Location: [facility];  Service:  Cardiovascular;  Laterality: N/A;  . COLONOSCOPY    . CORONARY ARTERY BYPASS GRAFT  2009   x 5  . IMPLANTABLE CARDIOVERTER DEFIBRILLATOR IMPLANT N/A 11/10/2013   Procedure: SUB Q IMPLANTABLE CARDIOVERTER DEFIBRILLATOR IMPLANT;  Surgeon: Deboraha Sprang, MD;  Location: [facility] CATH LAB;  Service: Cardiovascular;  Laterality: N/A;  . LEFT HEART CATH AND CORS/GRAFTS ANGIOGRAPHY N/A 08/13/2017   Procedure: LEFT HEART CATH AND CORS/GRAFTS ANGIOGRAPHY;  Surgeon: Sherren Mocha, MD;  Location: [place] CV LAB;  Service: Cardiovascular;  Laterality: N/A;  . LEFT HEART CATHETERIZATION WITH CORONARY ANGIOGRAM N/A 08/01/2013   Procedure: LEFT HEART CATHETERIZATION WITH CORONARY ANGIOGRAM;  Surgeon: Burnell Blanks, MD;  Location[facility] C[facility] LAB;  Service: Cardiovascular;  Laterality: N/A;  . POCKET REVISION N/A 12/16/2013   Procedure: POCKET REVISION;  Surgeon: Evans Lance, MD;  Location: [facility] CATH LAB;  Service: Cardiovascular;  Laterality: N/A;  . SHOULDER SURGERY Left    "sewed up; sports related"  . TEE WITHOUT CARDIOVERSION N/A 07/15/2012   Procedure: TRANSESOPHAGEAL ECHOCARDIOGRAM (TEE);  Surgeon: Thayer Headings, MD;  Location: [facility] ENDOSCOPY;  Service:  Cardiovascular;  Laterality: N/A;  . TONSILLECTOMY      Family History  Problem Relation Age of Onset  . Alzheimer's disease Mother   . Heart failure Father 65  . Hypertension Father   . Hyperlipidemia Father   . Cancer Father        lung- took half of a young- smoker  . Heart disease Father        MI at 21  . Leukemia Brother   . Drug abuse Daughter        heroine  . Early death Neg Hx   . Kidney disease Neg Hx   . Stroke Neg Hx    Social History:  reports that he quit smoking about 7 years ago. His smoking use included cigarettes. He has a 40.00 pack-year smoking history. He has never used smokeless tobacco. He reports current alcohol use of about 12.0 standard drinks of alcohol per week. He reports that he does not use drugs.  Allergies: No Known Allergies  Medications:                                                                                                                        I reviewed home medications   ROS:  Unable to obtain due to mental status   Examination:                                                                                                      General: Appears well-developed  Psych: Affect appropriate to situation Eyes: No scleral injection HENT: No OP obstrucion Head: Normocephalic.  Cardiovascular: Normal rate and regular rhythm.  Respiratory: Effort normal and breath sounds normal to anterior ascultation GI: Soft.  No distension. There is no tenderness.  Skin: WDI    Neurological Examination Mental Status: Alert, aphasic  (mute), not following any commands. Cranial Nerves: II: Visual fields: right homonymous hemianopsia III,IV, VI: ptosis not present, left gaze deviation crosses midline, pupils equal, round, reactive to light and accommodation VII: right side  Facial droop  Motor: Right : Upper  extremity   4/5    Left:     Upper extremity   5/5  Lower extremity   0/5     Lower extremity   5/5 Tone and bulk:normal tone throughout; no atrophy noted Sensory: Withdraws to pain to noxious stimuli on the left side Deep Tendon Reflexes: 2+ and symmetric throughout Plantars: Right: downgoing   Left: downgoing Cerebellar: No obvious ataxia in the left upper and lower extremities Gait: Unable to perform     Lab Results: Basic Metabolic Panel: Recent Labs  Lab 02/03/2018 03/11/2043 02/06/2018 10-Mar-2098  NA 137  --   K 4.4  --   CL 108  --   CO2 18*  --   GLUCOSE 117*  --   BUN 21  --   CREATININE 1.36* 1.40*  CALCIUM 9.3  --     CBC: Recent Labs  Lab 01/16/2018 11-Mar-2043  WBC 9.2  NEUTROABS 7.0  HGB 14.4  HCT 48.8  MCV 107.7*  PLT 138*    Coagulation Studies: Recent Labs    01/27/2018 11-Mar-2043  LABPROT 23.6*  INR 2.13    Imaging: Ct Angio Head W Or Wo Contrast  Result Date: 02/12/2018 CLINICAL DATA:  Initial evaluation for acute right-sided facial droop. EXAM: CT ANGIOGRAPHY HEAD AND NECK TECHNIQUE: Multidetector CT imaging of the head and neck was performed using the standard protocol during bolus administration of intravenous contrast. Multiplanar CT image reconstructions and MIPs were obtained to evaluate the vascular anatomy. Carotid stenosis measurements (when applicable) are obtained utilizing NASCET criteria, using the distal internal carotid diameter as the denominator. Multiphase CT imaging of the brain was performed following IV bolus contrast injection. Subsequent parametric perfusion maps were calculated using RAPID software. CONTRAST:  167m ISOVUE-370 IOPAMIDOL (ISOVUE-370) INJECTION 76% COMPARISON:  Prior CT from 08/13/2017 FINDINGS: CT HEAD FINDINGS Brain: Subtle evolving hypodensity within the left insula, adjacent left temporal lobe, and overlying supra ganglionic cortical gray matter, consistent with acute ischemic left MCA territory infarct. Sparing of the basal ganglia and  internal capsule at this time. No acute intracranial hemorrhage. No mass lesion, midline shift or mass effect. No hydrocephalus. No extra-axial fluid collection. Atrophy with chronic microvascular disease noted. Vascular: Asymmetric hyperdensity within the distal left M1 segment, suspicious for  possible large vessel occlusion. Calcified atherosclerosis at the skull base. Skull: Scalp soft tissues within normal limits.  Calvarium intact. Sinuses/Orbits: Globes and orbital soft tissues within normal limits. Right frontal sinus retention cyst. Paranasal sinuses are otherwise largely clear. No mastoid effusion. Other: 5 ASPECTS ([place] Stroke Program Early CT Score) - Ganglionic level infarction (caudate, lentiform nuclei, internal capsule, insula, M1-M3 cortex): 5 - Supraganglionic infarction (M4-M6 cortex): 2 Total score (0-10 with 10 being normal): 7 Review of the MIP images confirms the above findings CTA NECK FINDINGS Aortic arch: Visualized aortic arch of normal caliber with normal branch pattern. Moderate atherosclerotic change about the arch and origin of the great vessels without hemodynamically significant stenosis. Visualized subclavian arteries patent. Right carotid system: Right common carotid artery patent from its origin to the bifurcation without stenosis. Scattered calcified plaque about the right bifurcation/proximal right ICA without hemodynamically significant stenosis. Right ICA widely patent distally to the skull base without stenosis, dissection, or occlusion. Left carotid system: Left common carotid artery patent from its origin to the bifurcation without hemodynamically significant stenosis. Scattered mixed plaque about the left bifurcation/proximal left ICA with resultant stenosis of up to 50% by NASCET criteria. Left ICA patent distally without additional stenosis, dissection, or occlusion. Vertebral arteries: Both of the vertebral arteries arise from the subclavian arteries. Focal plaque at  the origin of the left vertebral artery with approximate mild-to-moderate stenosis (estimated 30-50%). Additional scattered plaque within the proximal left V2 segment with moderate stenosis (series 7, image 254). Left vertebral artery otherwise widely patent to the skull base. Right vertebral artery largely occluded within the neck. Scant distal reconstitution at the right V2/V3 segment via collateralization. Irregular attenuated flow seen within the right vert as it courses into the cranial vault. Skeleton: No acute osseous abnormality. No discrete lytic or blastic osseous lesions. Bulky anterior osteophytic spurring noted throughout the cervical spine. Other neck: No other acute soft tissue abnormality within the neck. Upper chest: Layering secretions noted within the subglottic trachea just above the carina. Small layering bilateral pleural effusions partially visualized. Associated scattered atelectatic changes noted within the right lung. Review of the MIP images confirms the above findings CTA HEAD FINDINGS Anterior circulation: Petrous segments widely patent bilaterally. Heavy calcified plaque within the cavernous/supraclinoid ICAs bilaterally with up to moderate approximate 50% stenosis, right worse than left. ICA termini perfused. Left A1 patent. Right A1 hypoplastic and/or absent. Normal anterior communicating artery. Anterior cerebral arteries patent to their distal aspects without flow-limiting stenosis. Left M1 patent proximally. Abrupt occlusion of the distal left M1 near the left MCA bifurcation, acute in appearance. Little to no collateral flow seen distally within the left MCA distribution. Right M1 patent to its distal aspect. Normal right MCA bifurcation. Distal right MCA branches well perfused. Posterior circulation: Multifocal scattered plaque within the dominant left multifocal stenosis. Patent left PICA. Scant attenuated flow within the right V4 segment as it courses into the cranial vault.  Superimposed atheromatous plaque with moderate to severe stenosis within the right V4 segment prior to the takeoff of the right PICA. Right V4 is patent to the vertebrobasilar junction. Right PICA is perfused proximally. Basilar patent to its distal aspect without stenosis. Superior cerebral arteries patent proximally. Left PCA supplied via the basilar. Predominant fetal type origin of the right PCA supplied via a patent right posterior communicating artery. PCAs patent to their distal aspects without hemodynamically significant stenosis. V4 segment with resultant mild Venous sinuses: Grossly patent, although not well evaluated due to arterial timing  of the contrast bolus. Anatomic variants: Fetal type right PCA. Delayed phase: Not performed. Review of the MIP images confirms the above findings IMPRESSION: CT HEAD IMPRESSION 1. Evolving hypodensity involving the left insula and overlying supra ganglionic left cerebral hemisphere, evolving acute left MCA territory infarct. No intracranial hemorrhage. 2. Aspects = 7. 3. Underlying age-related cerebral atrophy with mild chronic small vessel ischemic disease. CTA HEAD AND NECK IMPRESSION 1. Positive study for emergent large vessel occlusion, with occlusion of the distal left M1 segment. Little to no collateral flow seen distally within the left MCA distribution. 2. Approximate 50% atheromatous stenosis at the proximal left ICA. 3. Occluded vertebral artery within the neck, with irregular attenuated distal reconstitution just prior to the skull base. 4. Parotid carotid siphon atherosclerosis with associated moderate diffuse narrowing, right worse than left. 5. Small layering bilateral pleural effusions, partially visualized. These results were communicated to Dr. Lorraine Lax At 9:15 pmon 01/11/2020by text page via the [facility] messaging system. Electronically Signed   By: Jeannine Boga M.D.   On: 01/13/2018 21:53   Ct Head Wo Contrast  Result Date: 01/24/2018 CLINICAL  DATA:  Hemorrhage after thrombectomy. EXAM: CT HEAD WITHOUT CONTRAST TECHNIQUE: Contiguous axial images were obtained from the base of the skull through the vertex without intravenous contrast. COMPARISON:  CT HEAD February 04, 2018 at 2051 hours and CT HEAD February 04, 2018 at 2245 hours. FINDINGS: BRAIN: LEFT extra-axial varying density within sylvian fissure, prepontine cistern extending to LEFT frontal parietal sulci. Minimal LEFT extra-axial pneumocephalus. No definite intraparenchymal hemorrhage. Limited assessment for acute infarct due to presence of extra-axial density. Old RIGHT caudate head lacunar infarct. No hydrocephalus. Basal cisterns are patent. VASCULAR: Moderate to severe calcific atherosclerosis of the carotid siphons. SKULL: No skull fracture. No significant scalp soft tissue swelling. SINUSES/ORBITS: Mild paranasal sinus mucosal thickening. Mastoid air cells are well aerated.The included ocular globes and orbital contents are non-suspicious. OTHER: Life support lines in place. IMPRESSION: 1. Similar predominately LEFT extra-axial density compatible with contrast extravasation and hemorrhage with minimal extra-axial pneumocephalus. 2. No definite intraparenchymal hemorrhage. Limited assessment for acute infarct. Electronically Signed   By: Elon Alas M.D.   On: 01/21/2018 23:37   Ct Angio Neck W Or Wo Contrast  Result Date: 01/31/2018 CLINICAL DATA:  Initial evaluation for acute right-sided facial droop. EXAM: CT ANGIOGRAPHY HEAD AND NECK TECHNIQUE: Multidetector CT imaging of the head and neck was performed using the standard protocol during bolus administration of intravenous contrast. Multiplanar CT image reconstructions and MIPs were obtained to evaluate the vascular anatomy. Carotid stenosis measurements (when applicable) are obtained utilizing NASCET criteria, using the distal internal carotid diameter as the denominator. Multiphase CT imaging of the brain was performed following  IV bolus contrast injection. Subsequent parametric perfusion maps were calculated using RAPID software. CONTRAST:  164m ISOVUE-370 IOPAMIDOL (ISOVUE-370) INJECTION 76% COMPARISON:  Prior CT from 08/13/2017 FINDINGS: CT HEAD FINDINGS Brain: Subtle evolving hypodensity within the left insula, adjacent left temporal lobe, and overlying supra ganglionic cortical gray matter, consistent with acute ischemic left MCA territory infarct. Sparing of the basal ganglia and internal capsule at this time. No acute intracranial hemorrhage. No mass lesion, midline shift or mass effect. No hydrocephalus. No extra-axial fluid collection. Atrophy with chronic microvascular disease noted. Vascular: Asymmetric hyperdensity within the distal left M1 segment, suspicious for possible large vessel occlusion. Calcified atherosclerosis at the skull base. Skull: Scalp soft tissues within normal limits.  Calvarium intact. Sinuses/Orbits: Globes and orbital soft tissues within normal limits.  Right frontal sinus retention cyst. Paranasal sinuses are otherwise largely clear. No mastoid effusion. Other: 5 ASPECTS ([place] Stroke Program Early CT Score) - Ganglionic level infarction (caudate, lentiform nuclei, internal capsule, insula, M1-M3 cortex): 5 - Supraganglionic infarction (M4-M6 cortex): 2 Total score (0-10 with 10 being normal): 7 Review of the MIP images confirms the above findings CTA NECK FINDINGS Aortic arch: Visualized aortic arch of normal caliber with normal branch pattern. Moderate atherosclerotic change about the arch and origin of the great vessels without hemodynamically significant stenosis. Visualized subclavian arteries patent. Right carotid system: Right common carotid artery patent from its origin to the bifurcation without stenosis. Scattered calcified plaque about the right bifurcation/proximal right ICA without hemodynamically significant stenosis. Right ICA widely patent distally to the skull base without stenosis,  dissection, or occlusion. Left carotid system: Left common carotid artery patent from its origin to the bifurcation without hemodynamically significant stenosis. Scattered mixed plaque about the left bifurcation/proximal left ICA with resultant stenosis of up to 50% by NASCET criteria. Left ICA patent distally without additional stenosis, dissection, or occlusion. Vertebral arteries: Both of the vertebral arteries arise from the subclavian arteries. Focal plaque at the origin of the left vertebral artery with approximate mild-to-moderate stenosis (estimated 30-50%). Additional scattered plaque within the proximal left V2 segment with moderate stenosis (series 7, image 254). Left vertebral artery otherwise widely patent to the skull base. Right vertebral artery largely occluded within the neck. Scant distal reconstitution at the right V2/V3 segment via collateralization. Irregular attenuated flow seen within the right vert as it courses into the cranial vault. Skeleton: No acute osseous abnormality. No discrete lytic or blastic osseous lesions. Bulky anterior osteophytic spurring noted throughout the cervical spine. Other neck: No other acute soft tissue abnormality within the neck. Upper chest: Layering secretions noted within the subglottic trachea just above the carina. Small layering bilateral pleural effusions partially visualized. Associated scattered atelectatic changes noted within the right lung. Review of the MIP images confirms the above findings CTA HEAD FINDINGS Anterior circulation: Petrous segments widely patent bilaterally. Heavy calcified plaque within the cavernous/supraclinoid ICAs bilaterally with up to moderate approximate 50% stenosis, right worse than left. ICA termini perfused. Left A1 patent. Right A1 hypoplastic and/or absent. Normal anterior communicating artery. Anterior cerebral arteries patent to their distal aspects without flow-limiting stenosis. Left M1 patent proximally. Abrupt  occlusion of the distal left M1 near the left MCA bifurcation, acute in appearance. Little to no collateral flow seen distally within the left MCA distribution. Right M1 patent to its distal aspect. Normal right MCA bifurcation. Distal right MCA branches well perfused. Posterior circulation: Multifocal scattered plaque within the dominant left multifocal stenosis. Patent left PICA. Scant attenuated flow within the right V4 segment as it courses into the cranial vault. Superimposed atheromatous plaque with moderate to severe stenosis within the right V4 segment prior to the takeoff of the right PICA. Right V4 is patent to the vertebrobasilar junction. Right PICA is perfused proximally. Basilar patent to its distal aspect without stenosis. Superior cerebral arteries patent proximally. Left PCA supplied via the basilar. Predominant fetal type origin of the right PCA supplied via a patent right posterior communicating artery. PCAs patent to their distal aspects without hemodynamically significant stenosis. V4 segment with resultant mild Venous sinuses: Grossly patent, although not well evaluated due to arterial timing of the contrast bolus. Anatomic variants: Fetal type right PCA. Delayed phase: Not performed. Review of the MIP images confirms the above findings IMPRESSION: CT HEAD IMPRESSION 1. Evolving  hypodensity involving the left insula and overlying supra ganglionic left cerebral hemisphere, evolving acute left MCA territory infarct. No intracranial hemorrhage. 2. Aspects = 7. 3. Underlying age-related cerebral atrophy with mild chronic small vessel ischemic disease. CTA HEAD AND NECK IMPRESSION 1. Positive study for emergent large vessel occlusion, with occlusion of the distal left M1 segment. Little to no collateral flow seen distally within the left MCA distribution. 2. Approximate 50% atheromatous stenosis at the proximal left ICA. 3. Occluded vertebral artery within the neck, with irregular attenuated distal  reconstitution just prior to the skull base. 4. Parotid carotid siphon atherosclerosis with associated moderate diffuse narrowing, right worse than left. 5. Small layering bilateral pleural effusions, partially visualized. These results were communicated to Dr. Lorraine Lax At 9:15 pmon 01/22/2020by text page via the [facility] messaging system. Electronically Signed   By: Jeannine Boga M.D.   On: 01/26/2018 21:53   Ct Head Code Stroke Wo Contrast  Result Date: 02/08/2018 CLINICAL DATA:  Initial evaluation for acute right-sided facial droop. EXAM: CT ANGIOGRAPHY HEAD AND NECK TECHNIQUE: Multidetector CT imaging of the head and neck was performed using the standard protocol during bolus administration of intravenous contrast. Multiplanar CT image reconstructions and MIPs were obtained to evaluate the vascular anatomy. Carotid stenosis measurements (when applicable) are obtained utilizing NASCET criteria, using the distal internal carotid diameter as the denominator. Multiphase CT imaging of the brain was performed following IV bolus contrast injection. Subsequent parametric perfusion maps were calculated using RAPID software. CONTRAST:  143m ISOVUE-370 IOPAMIDOL (ISOVUE-370) INJECTION 76% COMPARISON:  Prior CT from 08/13/2017 FINDINGS: CT HEAD FINDINGS Brain: Subtle evolving hypodensity within the left insula, adjacent left temporal lobe, and overlying supra ganglionic cortical gray matter, consistent with acute ischemic left MCA territory infarct. Sparing of the basal ganglia and internal capsule at this time. No acute intracranial hemorrhage. No mass lesion, midline shift or mass effect. No hydrocephalus. No extra-axial fluid collection. Atrophy with chronic microvascular disease noted. Vascular: Asymmetric hyperdensity within the distal left M1 segment, suspicious for possible large vessel occlusion. Calcified atherosclerosis at the skull base. Skull: Scalp soft tissues within normal limits.  Calvarium intact.  Sinuses/Orbits: Globes and orbital soft tissues within normal limits. Right frontal sinus retention cyst. Paranasal sinuses are otherwise largely clear. No mastoid effusion. Other: 5 ASPECTS ([place]roke Program Early CT Score) - Ganglionic level infarction (caudate, lentiform nuclei, internal capsule, insula, M1-M3 cortex): 5 - Supraganglionic infarction (M4-M6 cortex): 2 Total score (0-10 with 10 being normal): 7 Review of the MIP images confirms the above findings CTA NECK FINDINGS Aortic arch: Visualized aortic arch of normal caliber with normal branch pattern. Moderate atherosclerotic change about the arch and origin of the great vessels without hemodynamically significant stenosis. Visualized subclavian arteries patent. Right carotid system: Right common carotid artery patent from its origin to the bifurcation without stenosis. Scattered calcified plaque about the right bifurcation/proximal right ICA without hemodynamically significant stenosis. Right ICA widely patent distally to the skull base without stenosis, dissection, or occlusion. Left carotid system: Left common carotid artery patent from its origin to the bifurcation without hemodynamically significant stenosis. Scattered mixed plaque about the left bifurcation/proximal left ICA with resultant stenosis of up to 50% by NASCET criteria. Left ICA patent distally without additional stenosis, dissection, or occlusion. Vertebral arteries: Both of the vertebral arteries arise from the subclavian arteries. Focal plaque at the origin of the left vertebral artery with approximate mild-to-moderate stenosis (estimated 30-50%). Additional scattered plaque within the proximal left V2 segment with moderate  stenosis (series 7, image 254). Left vertebral artery otherwise widely patent to the skull base. Right vertebral artery largely occluded within the neck. Scant distal reconstitution at the right V2/V3 segment via collateralization. Irregular attenuated flow  seen within the right vert as it courses into the cranial vault. Skeleton: No acute osseous abnormality. No discrete lytic or blastic osseous lesions. Bulky anterior osteophytic spurring noted throughout the cervical spine. Other neck: No other acute soft tissue abnormality within the neck. Upper chest: Layering secretions noted within the subglottic trachea just above the carina. Small layering bilateral pleural effusions partially visualized. Associated scattered atelectatic changes noted within the right lung. Review of the MIP images confirms the above findings CTA HEAD FINDINGS Anterior circulation: Petrous segments widely patent bilaterally. Heavy calcified plaque within the cavernous/supraclinoid ICAs bilaterally with up to moderate approximate 50% stenosis, right worse than left. ICA termini perfused. Left A1 patent. Right A1 hypoplastic and/or absent. Normal anterior communicating artery. Anterior cerebral arteries patent to their distal aspects without flow-limiting stenosis. Left M1 patent proximally. Abrupt occlusion of the distal left M1 near the left MCA bifurcation, acute in appearance. Little to no collateral flow seen distally within the left MCA distribution. Right M1 patent to its distal aspect. Normal right MCA bifurcation. Distal right MCA branches well perfused. Posterior circulation: Multifocal scattered plaque within the dominant left multifocal stenosis. Patent left PICA. Scant attenuated flow within the right V4 segment as it courses into the cranial vault. Superimposed atheromatous plaque with moderate to severe stenosis within the right V4 segment prior to the takeoff of the right PICA. Right V4 is patent to the vertebrobasilar junction. Right PICA is perfused proximally. Basilar patent to its distal aspect without stenosis. Superior cerebral arteries patent proximally. Left PCA supplied via the basilar. Predominant fetal type origin of the right PCA supplied via a patent right posterior  communicating artery. PCAs patent to their distal aspects without hemodynamically significant stenosis. V4 segment with resultant mild Venous sinuses: Grossly patent, although not well evaluated due to arterial timing of the contrast bolus. Anatomic variants: Fetal type right PCA. Delayed phase: Not performed. Review of the MIP images confirms the above findings IMPRESSION: CT HEAD IMPRESSION 1. Evolving hypodensity involving the left insula and overlying supra ganglionic left cerebral hemisphere, evolving acute left MCA territory infarct. No intracranial hemorrhage. 2. Aspects = 7. 3. Underlying age-related cerebral atrophy with mild chronic small vessel ischemic disease. CTA HEAD AND NECK IMPRESSION 1. Positive study for emergent large vessel occlusion, with occlusion of the distal left M1 segment. Little to no collateral flow seen distally within the left MCA distribution. 2. Approximate 50% atheromatous stenosis at the proximal left ICA. 3. Occluded vertebral artery within the neck, with irregular attenuated distal reconstitution just prior to the skull base. 4. Parotid carotid siphon atherosclerosis with associated moderate diffuse narrowing, right worse than left. 5. Small layering bilateral pleural effusions, partially visualized. These results were communicated to Dr. Lorraine Lax At 9:15 pmon 01/16/2020by text page via the [facility] messaging system. Electronically Signed   By: Jeannine Boga M.D.   On: 01/17/2018 21:53     ASSESSMENT AND PLAN   68 y.o. male last medical history of coronary artery disease status post CABG, systolic and diastolic congestive heart failure with ejection fraction of 30 to 35%, ICD placement, hypertension, hyperlipidemia and atrial fibrillation on Xarelto, diabetes mellitus with acute onset left MCA stroke with left M1 occlusion.  Patient underwent mechanical thrombectomy, however unsuccessful.  During procedure there was contrast excavasation procedure  was stopped.  Stat CT  head was obtained which showed early subarachnoid hemorrhage and contrast staining.  Large left MCA stroke status post mechanical thrombectomy with no recanalization. Subarachnoid hemorrhage post mechanical thrombectomy   Repeat CT head 4 hours No antiplatelets/AC Blood pressure goal 448-1 40 systolic  Close neuro checks Echocardiogram PT OT evaluation after 24 hours   Respiratory failure, intubated for procedure - appreciate  PCCM assistance in managing ventilator  Congestive heart failure-gentle IV fluids, will avoid volume overload Coronary artery disease-hold Plavix until hemorrhage is stable Diabetes mellitus-sliding scale insulin, AIC ordered Hypertension: BP goal 856/314 systolic, PRN labetalol if BP >140 SBP   Full Code DVT PPX; SCD      This patient is neurologically critically ill due to left MCA stroke due to left M1 occlusion , status post mechanical thrombectomy with possible arterial injury, subarachnoid hemorrhage.  He is at risk for significant risk of neurological worsening from cerebral edema,  death from brain herniation, heart failure, hemorrhagic conversion, infection, respiratory failure and seizure. This patient's care requires constant monitoring of vital signs, hemodynamics, respiratory and cardiac monitoring, review of multiple databases, neurological assessment, discussion with family, other specialists and medical decision making of high complexity.  I spent 70 minutes of neurocritical time in the care of this patient.     Larry Rangel [facility] Pager Number [phone]

## 2018-02-05 NOTE — Progress Notes (Signed)
Patient will not be going to MRI due to internal Defibrillator. MD contacted and will be going to CT at 0300

## 2018-02-05 NOTE — Progress Notes (Signed)
Initial Nutrition Assessment  DOCUMENTATION CODES:   Not applicable  INTERVENTION:   Initiate Vital High Protein @ 45 ml/hr via OG tube 30 ml Prostat BID  Provides: 1280 kcal, 124 grams protein, and 946 ml free water TF regimen and propofol at current rate providing 1890 total kcal/day (100 % of kcal needs)  NUTRITION DIAGNOSIS:   Inadequate oral intake related to inability to eat as evidenced by NPO status.  GOAL:   Patient will meet greater than or equal to 90% of their needs  MONITOR:   TF tolerance, I & O's  REASON FOR ASSESSMENT:   Consult, Ventilator Enteral/tube feeding initiation and management  ASSESSMENT:   Pt with PMH of CAD s/p CABG, CHF, DM, afib, and HTN admitted with L MCA CVA s/p IR without successful intervention with hemorrhagic conversion post op.    Spoke with RN. Plan to start nutrition today.   Patient is currently intubated on ventilator support MV: 9.5 L/min Temp (24hrs), Avg:97.9 F (36.6 C), Min:97.5 F (36.4 C), Max:98.9 F (37.2 C)  Propofol: 15.86 ml/hr (30 mcg) provides: 418 kcal  Cleviprex: 4 ml/hr (2 mg/hr) provides: 192 kcal  Medications reviewed and include: SSI 3% hypertonic saline Labs reviewed MAP: 88   NUTRITION - FOCUSED PHYSICAL EXAM:  Unable to complete Nutrition-Focused physical exam at this time.   Diet Order:   Diet Order            Diet NPO time specified  Diet effective now              EDUCATION NEEDS:      Skin:  Skin Assessment: Reviewed RN Assessment  Last BM:  unknown  Height:   Ht Readings from Last 1 Encounters:  01/22/2018 5\' 10"  (1.778 m)    Weight:   Wt Readings from Last 1 Encounters:  02/01/2018 88.1 kg    Ideal Body Weight:  75.4 kg  BMI:  Body mass index is 27.87 kg/m.  Estimated Nutritional Needs:   Kcal:  1885  Protein:  105-125 grams  Fluid:  >2 L/day  Kendell Bane RD, LDN, CNSC [phone] Pager [phone] After Hours Pager

## 2018-02-05 NOTE — Progress Notes (Signed)
PT Cancellation Note  Patient Details Name: THELONIOUS OGLESBEE MRN: [id number] DOB: 1950/05/15   Cancelled Treatment:    Reason Eval/Treat Not Completed: Patient not medically ready.  Not stable enough per RN 02/05/2018  Bledsoe Bing, PT Acute Rehabilitation Services [phone]  (pager) [phone]  (office)   Eliseo Gum Atziri Zubiate 02/05/2018, 12:51 PM

## 2018-02-05 NOTE — Progress Notes (Signed)
SLP Cancellation Note  Patient Details Name: Larry Rangel MRN: [id number] DOB: 17-Feb-1950   Cancelled treatment:       Reason Eval/Treat Not Completed: Medical issues which prohibited therapy. Pt intubated last night with plans to extubate today.    Royce Macadamia 02/05/2018, 9:22 AM   Breck Coons Lonell Face.Ed Nurse, children's [phone] Office [phone]

## 2018-02-05 NOTE — Progress Notes (Signed)
PCCM:  Long discussion with wife and daughter at bedside.  They understand that the patient would not want permanent mechanical ventilation.  I also discussed prognosis with the patient's primary neurologist.  After a discussion of goals of care and how the family would like to proceed.  The decision was made to make the patient a DNR with no escalation of care.  If he continues to improve then we will consider one-way extubation in the future.  The family has also requested a visitation from the chaplain as well as involvement of our palliative care team.  We will attempt to arrange this.  In the meantime we have agreed that it would be appropriate to use 3% saline peripherally until the family is able to reconcile these decisions.  I will place orders for change of CODE STATUS to DNR.  This discussion was witnessed by patient's primary bedside nurse.  Josephine Igo, DO [facility] 02/05/2018 11:48 AM

## 2018-02-06 ENCOUNTER — Inpatient Hospital Stay (HOSPITAL_COMMUNITY): Payer: Medicare HMO

## 2018-02-06 ENCOUNTER — Inpatient Hospital Stay: Payer: Self-pay

## 2018-02-06 DIAGNOSIS — I63412 Cerebral infarction due to embolism of left middle cerebral artery: Principal | ICD-10-CM

## 2018-02-06 DIAGNOSIS — I255 Ischemic cardiomyopathy: Secondary | ICD-10-CM

## 2018-02-06 DIAGNOSIS — I482 Chronic atrial fibrillation, unspecified: Secondary | ICD-10-CM

## 2018-02-06 DIAGNOSIS — Z01818 Encounter for other preprocedural examination: Secondary | ICD-10-CM

## 2018-02-06 LAB — SODIUM
SODIUM: 151 mmol/L — AB (ref 135–145)
SODIUM: 156 mmol/L — AB (ref 135–145)
Sodium: 145 mmol/L (ref 135–145)
Sodium: 149 mmol/L — ABNORMAL HIGH (ref 135–145)
Sodium: 155 mmol/L — ABNORMAL HIGH (ref 135–145)

## 2018-02-06 LAB — GLUCOSE, CAPILLARY
Glucose-Capillary: 105 mg/dL — ABNORMAL HIGH (ref 70–99)
Glucose-Capillary: 116 mg/dL — ABNORMAL HIGH (ref 70–99)
Glucose-Capillary: 117 mg/dL — ABNORMAL HIGH (ref 70–99)
Glucose-Capillary: 132 mg/dL — ABNORMAL HIGH (ref 70–99)
Glucose-Capillary: 138 mg/dL — ABNORMAL HIGH (ref 70–99)
Glucose-Capillary: 182 mg/dL — ABNORMAL HIGH (ref 70–99)

## 2018-02-06 LAB — MAGNESIUM
Magnesium: 2.3 mg/dL (ref 1.7–2.4)
Magnesium: 2.5 mg/dL — ABNORMAL HIGH (ref 1.7–2.4)

## 2018-02-06 LAB — PHOSPHORUS
Phosphorus: 2.5 mg/dL (ref 2.5–4.6)
Phosphorus: 3.3 mg/dL (ref 2.5–4.6)

## 2018-02-06 MED ORDER — SODIUM CHLORIDE 0.9% FLUSH
10.0000 mL | Freq: Two times a day (BID) | INTRAVENOUS | Status: DC
  Administered 2018-02-06: 10 mL
  Administered 2018-02-07: 20 mL
  Administered 2018-02-07 – 2018-02-08 (×3): 10 mL
  Administered 2018-02-09: 20 mL

## 2018-02-06 MED ORDER — CARVEDILOL 12.5 MG PO TABS: 12.5000 mg | ORAL_TABLET | Freq: Two times a day (BID) | ORAL | Status: DC

## 2018-02-06 MED ORDER — CARVEDILOL 3.125 MG PO TABS
6.2500 mg | ORAL_TABLET | Freq: Two times a day (BID) | ORAL | Status: DC
  Administered 2018-02-06: 6.25 mg via ORAL
  Filled 2018-02-06: qty 2

## 2018-02-06 MED ORDER — SODIUM CHLORIDE 3 % IV SOLN
INTRAVENOUS | Status: DC
  Filled 2018-02-06 (×7): qty 500

## 2018-02-06 MED ORDER — CARVEDILOL 3.125 MG PO TABS
6.2500 mg | ORAL_TABLET | Freq: Two times a day (BID) | ORAL | Status: DC
  Administered 2018-02-07 – 2018-02-08 (×2): 6.25 mg
  Filled 2018-02-06 (×2): qty 2

## 2018-02-06 MED ORDER — CHLORHEXIDINE GLUCONATE CLOTH 2 % EX PADS
6.0000 | MEDICATED_PAD | Freq: Every day | CUTANEOUS | Status: DC
  Administered 2018-02-07 – 2018-02-08 (×3): 6 via TOPICAL

## 2018-02-06 MED ORDER — SODIUM CHLORIDE 0.9% FLUSH: 10.0000 mL | INTRAVENOUS | Status: DC | PRN

## 2018-02-06 NOTE — Progress Notes (Signed)
OT Cancellation Note  Patient Details Name: DESHANNON DEMARCE MRN: [id number] DOB: 1950-12-15   Cancelled Treatment:    Reason Eval/Treat Not Completed: Patient not medically ready.  Will reattempt.  Jeani Hawking, OTR/L Acute Rehabilitation Services Pager [phone] Office [phone]   Jeani Hawking M 02/06/2018, 10:39 AM

## 2018-02-06 NOTE — Progress Notes (Signed)
PT Cancellation Note  Patient Details Name: Larry Rangel MRN: [id number] DOB: 28-Jun-1950   Cancelled Treatment:    Reason Eval/Treat Not Completed: Patient not medically ready   Fabio Asa 02/06/2018, 10:29 AM

## 2018-02-06 NOTE — Progress Notes (Signed)
NAME:  Larry MonarchRandall L Rangel, MRN:  [id number], DOB:  12-09-1950, LOS: 2 ADMISSION DATE:  01/21/2018, CONSULTATION DATE:  1/23 REFERRING MD:  Dr. Laurence SlateAroor, CHIEF COMPLAINT:  CVA with hemorrhagic conversion.    Brief History   68 year old male admitted with L MCA CVA and taken to IR without successful intervention. Hemorrhagic conversion noted post-op. On vent in ICU.   History of present illness   68 year old male with past medical history as below, which is significant for coronary artery disease status post CABG in 2009, systolic congestive heart failure with a EF of 20 to 25%, type 2 diabetes, paroxysmal atrial fibrillation on Xarelto, and hypertension.  [facility] department on 1/23 as a code stroke.  He was last known well at 7:55 PM while at work.  He had a witnessed onset of dysarthria and EMS was called and initiated code stroke.  Imaging of the brain in the emergency department were consistent with left MCA infarct.  He was taken to IR for attempted mechanical thrombectomy which was unfortunately unsuccessful.  Postoperative CT scan demonstrated hemorrhagic conversion.  He was transferred to the ICU on the ventilator.  PCCM was asked to consult.  Past Medical History   has a past medical history of Abnormal liver function (08/23/2010), AICD (automatic cardioverter/defibrillator) present, Anxiety, Arthritis, CAD (coronary artery disease), CHF (congestive heart failure) (HCC) (07/30/2013), Chicken pox (as a child), Chronic combined systolic and diastolic CHF (congestive heart failure) (HCC), Hyperlipidemia, Hypertension, LBBB (left bundle branch block), Low back pain, Measles (as a child), Mumps (as a child), Myocardial infarction (HCC) (2009), Paroxysmal atrial flutter (HCC) (07-2012), Preventative health care (12/10/2014), Tobacco user, and Type II diabetes mellitus (HCC).   Significant Hospital Events   1/23 admit  Consults:  IR PCCM  Procedures:  ETT 1/23 > Art line  1/23 > IR mechanical thrombectomy attempt 1/23 >  Significant Diagnostic Tests:  CT head 1/23 >>> CTA/P 1/23 > Positive study for emergent large vessel occlusion, with occlusion of the distal left M1 segment. Little to no collateral flow seen distally within the left MCA distribution. Approximate 50% atheromatous stenosis at the proximal left ICA. Occluded vertebral artery within the neck, with irregular attenuated distal reconstitution just prior to the skull base. Parotid carotid siphon atherosclerosis with associated moderate diffuse narrowing, right worse than left. CT head 1/24 > Similar predominately LEFT extra-axial density compatible with contrast extravasation and hemorrhage with minimal extra-axial pneumocephalus. No definite intraparenchymal hemorrhage. CT head 1/25 >> worsening left MCA distribution cytotoxic edema with midline shift now 4 mm, partial clearing contrast in left SAH, no new bleeding  Micro Data:    Antimicrobials:    Interim history/subjective:  No problems reported from overnight Remains on 3% saline via PIV Tolerating pressure support currently Propofol turned off this morning  Objective   Blood pressure (!) 110/59, pulse 84, temperature 98.7 F (37.1 C), temperature source Axillary, resp. rate 16, height 5\' 10"  (1.778 m), weight 92.9 kg, SpO2 100 %.    Vent Mode: PRVC FiO2 (%):  [40 %] 40 % Set Rate:  [16 bmp] 16 bmp Vt Set:  [580 mL] 580 mL PEEP:  [5 cmH20] 5 cmH20 Pressure Support:  [10 cmH20] 10 cmH20 Plateau Pressure:  [16 cmH20-17 cmH20] 17 cmH20   Intake/Output Summary (Last 24 hours) at 02/06/2018 0833 Last data filed at 02/06/2018 0828 Gross per 24 hour  Intake 2480.92 ml  Output 600 ml  Net 1880.92 ml   [employer]  02/05/2018 2051 01/17/2018 2338 02/06/18 0500  Weight: 89.4 kg 88.1 kg 92.9 kg    Examination: General: Ill-appearing adult man, mechanically ventilated HENT: ET tube in place, oropharynx otherwise clear Lungs:  Bilaterally, no wheezes or crackles Cardiovascular: Irregularly irregular, no murmur no edema Abdomen: Soft, nondistended, positive bowel sounds Extremities: No deformities Neuro: Propofol off.  He will grimace to pain, did not open eyes, does have purposeful movement on the left.  Did not move on the right  Resolved Hospital Problem list     Assessment & Plan:   Acute L MCA CVA complicated by hemorrhagic conversion, cytotoxic edema -Appreciate neurology input -SBP goal 110-140 mmHg, Cleviprex in place -Neuro checks -Consider PEG placement if we believe there is efficacy to continued 3% saline  Acute respiratory failure due to CVA -Continue full ventilator support and airway protection -Sedation with propofol -Intermittent chest x-ray   Paroxysmal atrial fibrillation -Off anticoagulation given his SAH  Acute renal insufficiency -Follow urine output and BMP  Chronic HFrEF (LVEF 20-25%) -Lasix, spironolactone, blood pressure regimen on hold -Cleviprex running  DM2 -Sliding scale insulin per protocol -Metformin on hold   Best practice:  Diet: NPO Pain/Anxiety/Delirium protocol (if indicated): Propofol VAP protocol (if indicated): Per protocol DVT prophylaxis: SCD GI prophylaxis: PPI Glucose control: SSI Mobility: BR Code Status: FULL Family Communication: Patient's wife and daughter updated at bedside on 1/25 Disposition: ICU  Labs   CBC: Recent Labs  Lab 01/25/2018 2045 02/05/18 0452  WBC 9.2 13.3*  NEUTROABS 7.0 12.4*  HGB 14.4 13.2  HCT 48.8 40.8  MCV 107.7* 102.5*  PLT 138* 135*    Basic Metabolic Panel: Recent Labs  Lab 01/22/2018 2045 02/11/2018 2100 02/05/18 0452 02/05/18 1008 02/05/18 1545 02/05/18 2208 02/06/18 0408  NA 137  --  137 140 142 145 149*  K 4.4  --  4.2  --   --   --   --   CL 108  --  109  --   --   --   --   CO2 18*  --  19*  --   --   --   --   GLUCOSE 117*  --  194*  --   --   --   --   BUN 21  --  23  --   --   --   --     CREATININE 1.36* 1.40* 1.40*  --   --   --   --   CALCIUM 9.3  --  8.8*  --   --   --   --   MG  --   --   --   --  2.1  --  2.3  PHOS  --   --   --   --  3.7  --  2.5   GFR: Estimated Creatinine Clearance: 57.1 mL/min (A) (by C-G formula based on SCr of 1.4 mg/dL (H)). Recent Labs  Lab 01/21/2018 2045 02/05/18 0452  WBC 9.2 13.3*    Liver Function Tests: Recent Labs  Lab 01/27/2018 2045  AST 22  ALT 15  ALKPHOS 57  BILITOT 1.9*  PROT 6.7  ALBUMIN 3.7   No results for input(s): LIPASE, AMYLASE in the last 168 hours. No results for input(s): AMMONIA in the last 168 hours.  ABG    Component Value Date/Time   PHART 7.335 (L) 01/27/2018 0215   PCO2ART 39.2 02/01/2018 0215   PO2ART 233 (H) 02/01/2018 0215   HCO3 20.3 02/01/2018 0215  TCO2 22 08/01/2013 0945   ACIDBASEDEF 4.5 (H) 02/05/2018 0215   O2SAT 99.4 02/01/2018 0215     Coagulation Profile: Recent Labs  Lab 02/11/2018 2045  INR 2.13    Cardiac Enzymes: No results for input(s): CKTOTAL, CKMB, CKMBINDEX, TROPONINI in the last 168 hours.  HbA1C: Hgb A1c MFr Bld  Date/Time Value Ref Range Status  02/05/2018 04:52 AM 5.1 4.8 - 5.6 % Final    Comment:    (NOTE) Pre diabetes:          5.7%-6.4% Diabetes:              >6.4% Glycemic control for   <7.0% adults with diabetes   12/01/2017 10:51 AM 5.1 4.6 - 6.5 % Final    Comment:    Glycemic Control Guidelines for People with Diabetes:Non Diabetic:  <6%Goal of Therapy: <7%Additional Action Suggested:  >8%     CBG: Recent Labs  Lab 02/05/18 1530 02/05/18 1911 02/05/18 2310 02/06/18 0333 02/06/18 0748  GLUCAP 82 101* 113* 138* 117*    Independent critical care time 35 minutes  Levy Pupa, MD, PhD 02/06/2018, 8:59 AM [facility] [phone] or if no answer [phone]

## 2018-02-06 NOTE — Progress Notes (Signed)
Peripherally Inserted Central Catheter/Midline Placement  The IV Nurse has discussed with the patient and/or persons authorized to consent for the patient, the purpose of this procedure and the potential benefits and risks involved with this procedure.  The benefits include less needle sticks, lab draws from the catheter, and the patient may be discharged home with the catheter. Risks include, but not limited to, infection, bleeding, blood clot (thrombus formation), and puncture of an artery; nerve damage and irregular heartbeat and possibility to perform a PICC exchange if needed/ordered by physician.  Alternatives to this procedure were also discussed.  B[employer] Power PICC patient education guide, fact sheet on infection prevention and patient information card has been provided to patient /or left at bedside. Wife signed consent at bedside    PICC/Midline Placement Documentation  PICC Double Lumen 02/06/18 PICC Left Cephalic 47 cm 0 cm (Active)  Indication for Insertion or Continuance of Line Administration of hyperosmolar/irritating solutions (i.e. TPN, Vancomycin, etc.) 02/06/2018  4:00 PM  Exposed Catheter (cm) 0 cm 02/06/2018  4:00 PM  Site Assessment Clean;Dry;Intact 02/06/2018  4:00 PM  Lumen #1 Status Flushed;Blood return noted;Saline locked 02/06/2018  4:00 PM  Lumen #2 Status Flushed;Blood return noted;Saline locked 02/06/2018  4:00 PM  Dressing Type Transparent 02/06/2018  4:00 PM  Dressing Status Clean;Dry;Intact;Antimicrobial disc in place 02/06/2018  4:00 PM  Line Care Connections checked and tightened 02/06/2018  4:00 PM  Line Adjustment (NICU/IV Team Only) No 02/06/2018  4:00 PM  Dressing Intervention New dressing 02/06/2018  4:00 PM  Dressing Change Due 02/13/18 02/06/2018  4:00 PM       Edwin Cap 02/06/2018, 4:47 PM

## 2018-02-06 NOTE — Progress Notes (Signed)
STROKE TEAM PROGRESS NOTE   INTERVAL HISTORY His wife ad daughter is at the bedside.  Repeat CT this am showed worsening MLS at 24mm, partial clearing of the [facility]. Pt off propofol on exam but intermittent agitation. Still has right hemiplegia and right neglect, not following commands.   Vitals:   02/06/18 0515 02/06/18 0530 02/06/18 0545 02/06/18 0600  BP:      Pulse: 85 83 84 88  Resp: 16 17 18 18   Temp:      TempSrc:      SpO2: 100% 100% 100% 100%  Weight:      Height:        CBC:  Recent Labs  Lab 01/14/2018 2045 02/05/18 0452  WBC 9.2 13.3*  NEUTROABS 7.0 12.4*  HGB 14.4 13.2  HCT 48.8 40.8  MCV 107.7* 102.5*  PLT 138* 135*    Basic Metabolic Panel:  Recent Labs  Lab 02/08/2018 2045 02/07/2018 2100 02/05/18 0452  02/05/18 1545 02/05/18 2208 02/06/18 0408  NA 137  --  137   < > 142 145 149*  K 4.4  --  4.2  --   --   --   --   CL 108  --  109  --   --   --   --   CO2 18*  --  19*  --   --   --   --   GLUCOSE 117*  --  194*  --   --   --   --   BUN 21  --  23  --   --   --   --   CREATININE 1.36* 1.40* 1.40*  --   --   --   --   CALCIUM 9.3  --  8.8*  --   --   --   --   MG  --   --   --   --  2.1  --  2.3  PHOS  --   --   --   --  3.7  --  2.5   < > = values in this interval not displayed.   Lipid Panel:     Component Value Date/Time   CHOL 130 02/05/2018 0452   TRIG 148 02/05/2018 0452   TRIG 149 02/05/2018 0452   HDL 32 (L) 02/05/2018 0452   CHOLHDL 4.1 02/05/2018 0452   VLDL 30 02/05/2018 0452   LDLCALC 68 02/05/2018 0452   HgbA1c:  Lab Results  Component Value Date   HGBA1C 5.1 02/05/2018   Urine Drug Screen:     Component Value Date/Time   LABOPIA NONE DETECTED 01/29/2017 2327   COCAINSCRNUR NONE DETECTED 01/29/2017 2327   COCAINSCRNUR NEG 11/12/2012 1648   LABBENZ NONE DETECTED 01/29/2017 2327   LABBENZ NEG 11/12/2012 1648   AMPHETMU NONE DETECTED 01/29/2017 2327   THCU NONE DETECTED 01/29/2017 2327   LABBARB NONE DETECTED 01/29/2017 2327     Alcohol Level     Component Value Date/Time   ETH <10 08/23/2010 1551    IMAGING  Ct Angio Head W Or Wo Contrast Ct Angio Neck W Or Wo Contrast Ct Head Code Stroke Wo Contrast 01/31/2018 IMPRESSION:   CT HEAD  1. Evolving hypodensity involving the left insula and overlying supra ganglionic left cerebral hemisphere, evolving acute left MCA territory infarct. No intracranial hemorrhage.  2. Aspects = 7  3. Underlying age-related cerebral atrophy with mild chronic small vessel ischemic disease.   CTA HEAD AND NECK 1. Positive  study for emergent large vessel occlusion, with occlusion of the distal left M1 segment. Little to no collateral flow seen distally within the left MCA distribution.  2. Approximate 50% atheromatous stenosis at the proximal left ICA.  3. Occluded vertebral artery within the neck, with irregular attenuated distal reconstitution just prior to the skull base.  4. Parotid carotid siphon atherosclerosis with associated moderate diffuse narrowing, right worse than left.  5. Small layering bilateral pleural effusions, partially visualized.    Ct Head Wo Contrast 02/06/2018 IMPRESSION:  1. Worsening left MCA distribution cytotoxic edema with increased midline shift now measuring 4 mm.  2. Partial clearing of extravasated contrast superimposed on left hemispheric subarachnoid blood. No new site of hemorrhage.    Ct Head Wo Contrast 02/05/2018 IMPRESSION:  1. Increased hemorrhage LEFT sylvian fissure density superimposed on extra-axial contrast extravasation and blood.  2. Increased conspicuity of LEFT insular and frontoparietal/MCA infarct. New minimal petechial hemorrhage versus contrast staining.    Ct Head Wo Contrast 01/25/2018 IMPRESSION:  1. Similar predominately LEFT extra-axial density compatible with contrast extravasation and hemorrhage with minimal extra-axial pneumocephalus.  2. No definite intraparenchymal hemorrhage. Limited assessment for acute  infarct.     Dg Chest Port 1 View 02/05/2018 IMPRESSION:  1. Endotracheal tube tip at the thoracic inlet approximately 6.6 cm above the carina distal repositioning should be considered. NG tube tip below left hemidiaphragm.  2. AICD noted stable position with lead over the midline. Prior CABG. Stable cardiomegaly. Mild interstitial prominence noted. Mild CHF can not be excluded.    Cerebral angiogram 01/30/2018 Lt MCA M1 occlusion with early recanalization.procedure aborted after extravasation of contrast noted following deployment of  A 46mm x 40 mm solitaire device associated with severe spasm. NO changes noted in BP or HR. Device recaptured into microcath and retrieved . immediate CT brain demonstrates contrast in the Lt perisylvian fissure and Lt temporal cerebral convexity. No mass effect or midline shift noted . No intraparenchymal  hemorrhage noted.  2D echocardiogram 02/11/2018 - Left ventricle: The cavity size was moderately dilated. Wall thickness was increased in a pattern of mild LVH. Systolic function was severely reduced. The estimated ejection fraction was in the range of 20% to 25%. Diffuse hypokinesis. - Mitral valve: Calcified annulus. There was mild regurgitation. - Left atrium: The atrium was severely dilated. - Right ventricle: The cavity size was mildly dilated. Systolic function was moderately reduced. - Right atrium: The atrium was moderately dilated. Impressions:  Definity used; severe global reduction in LV systolic function; mild LVH; 4 chamber enlargement; mild MR; moderate RV dysfunction.   PHYSICAL EXAM  Temp:  [97.6 F (36.4 C)-100.2 F (37.9 C)] 98.7 F (37.1 C) (01/25 0800) Pulse Rate:  [63-105] 99 (01/25 1000) Resp:  [14-25] 24 (01/25 1000) BP: (92-150)/(59-94) 133/81 (01/25 1000) SpO2:  [93 %-100 %] 97 % (01/25 1000) Arterial Line BP: (97-155)/(54-78) 145/67 (01/25 1000) FiO2 (%):  [40 %] 40 % (01/25 0813) Weight:  [92.9 kg] 92.9 kg (01/25  0500)  General - Well nourished, well developed, intubated off sedation.  Ophthalmologic - fundi not visualized due to noncooperation.  Cardiovascular - irregularly irregular heart rate and rhythm.  Neuro - intubated off sedation, eyes closed but able to open on voice but difficulty to maintain opening, not following commands. With forced eye opening, eyes in left gaze position, not blinking to visual threat to the right, doll's eyes present, but not tracking, PERRL. Corneal reflex present, gag and cough present. Breathing over the vent.  Facial symmetry not able to test due to ET tube.  Tongue midline in mouth. LUE spontaneous movement against gravity, on pain stimulation, LLE 3/5, RLE and RUE mild withdraw. DTR 1+ and positive babinski on the right. Sensation, coordination and gait not tested.   ASSESSMENT/PLAN Mr. SIRIS GRAUS is a 68 y.o. male with history of CAD s/p CABG, systolic and diastolic CHF w/ ET 30-35%, AICD, HTN, HLD, AF on xarelto and DM presenting with aphasia and right-sided weakness.   Stroke:  left MCA infarct d/t L M1 occlusion s/p aborted IR d/t hemorrhage at time of device deployment, now w/ cerebral edema, subarachnoid hemorrhage. Infarct  Embolic secondary to known AF in setting of missed Xarelto doses  Code Stroke CT head evolving L insular/MCA territory infarct.     CTA head & neck L M1 occlusion w/o collaterals. L ICA 50%. Occluded VA in the neck. R>L ICA siphon atherosclerosis. Small layering B pleural effusion  Cerebral angio left MCA M1 occlusion with recanalization aborted after extravasation of contrast after Solitaire deployment for severe spasm  CT head 1/23 similar predominately large L extra-axial density c/w contrast extravasation. No definite hmg. limited ability to assess infarct  CT head 1/24 increased hgm L sylvian fissure superimposed on extra-axial contrast extravasation and blood. Now able to see L insular and FP/MCA infarct. New petechial hmg  vs contrast staining  CT head 1/25 Worsening left MCA distribution cytotoxic edema with increased midline shift now measuring 4 mm. Partial clearing of extravasated contrast superimposed on left hemispheric subarachnoid blood.  2D Echo EF 20 to 25% with diffuse hypokinesis.  Left atrium severely dilated.  Right atrium moderately dilated.  LDL 68  HgbA1c 5.1  SCDs for VTE prophylaxis  aspirin 81 mg daily, clopidogrel 75 mg daily and Xarelto (rivaroxaban) daily prior to admission (though wife thinks he missed 1-2 doses xarelto PTA), now on No antithrombotic given hemorrhage   Therapy recommendations:  pending   Disposition:  pending   I again had long discussion w/ wife and dtr r/t severe stroke and potential neuro outcome. Family understands. Wants to discuss amongst themselves. Family agree with palliative care discussion for GOC.   Cerebral Edema Induced Hypernatremia  On 3% saline  PICC line requested  CT head showed worsening MLS at 44mm  Continue 3% saline  Goal Na 150-155   Check Na q 6h -> 149->151  Acute Respiratory Failure  Intubated  CCM onboard  Off sedation   Extubate as able   Atrial Fibrillation  Home anticoagulation:  Xarelto (rivaroxaban) daily , likely missed 1-2 doses prior to admission  No AC at this time given cerebral hemorrhage  Rate under control  Resume coreg low dose  CHF with cardiomyopathy  Chronic systolic and diastolic Congestive heart failure w/ baseline EF 30-35%  Status post AICD  EF 20 to 25% this admission  CCM on board  Coreg low-dose  Hypertension  Stable . goal SBP < 160 given ICH . Home meds - coreg, losartan and spironolactone . Resume half dose of coreg now . Off cleviprex  Hyperlipidemia  Home meds:  lipitor 80  Statin on hold given hemorrhage  LDL 68, goal < 70  Continue statin at discharge  Diabetes type II  HgbA1c 5.1, at goal < 7.0  Controlled  SSI  CBG monitoring  Dysphagia,  secondary to acute stroke   N.p.o.  On tube feeding  Other Stroke Risk Factors  Advanced age  Former Cigarette smoker, quit 7 years ago  Coronary artery disease s/p CABG  Other Active Problems  Leukocytosis, WBC 9.2-13.3  Thrombocytopenia, PLT 135  CKD stage II creatinine 1.36-1.40-1.40  Low-grade fever T-max 100.2  Hospital day # 2  This patient is critically ill and at significant risk of neurological worsening, death and care requires constant monitoring of vital signs, hemodynamics,respiratory and cardiac monitoring, extensive review of multiple databases, frequent neurological assessment, discussion with family, other specialists and medical decision making of high complexity.I have made any additions or clarifications directly to the above note.This critical care time does not reflect procedure time, or teaching time or supervisory time of PA/NP/Med Resident etc but could involve care discussion time.  I spent 45 minutes of neurocritical care time  in the care of  this patient. I had long discussion with wife and daughter at bedside, updated pt current condition, treatment plan and potential prognosis. They expressed understanding and appreciation.   Marvel PlanJindong Chamberlain Steinborn, MD PhD Stroke Neurology 02/06/2018 5:32 PM  To contact Stroke Continuity provider, please refer to [email or web address]. After hours, contact General Neurology

## 2018-02-07 ENCOUNTER — Inpatient Hospital Stay (HOSPITAL_COMMUNITY): Payer: Medicare HMO

## 2018-02-07 ENCOUNTER — Other Ambulatory Visit: Payer: Self-pay | Admitting: Family Medicine

## 2018-02-07 DIAGNOSIS — I4821 Permanent atrial fibrillation: Secondary | ICD-10-CM

## 2018-02-07 DIAGNOSIS — J9601 Acute respiratory failure with hypoxia: Secondary | ICD-10-CM

## 2018-02-07 DIAGNOSIS — Z452 Encounter for adjustment and management of vascular access device: Secondary | ICD-10-CM

## 2018-02-07 DIAGNOSIS — Z515 Encounter for palliative care: Secondary | ICD-10-CM

## 2018-02-07 LAB — CBC
HCT: 40 % (ref 39.0–52.0)
Hemoglobin: 12 g/dL — ABNORMAL LOW (ref 13.0–17.0)
MCH: 32.8 pg (ref 26.0–34.0)
MCHC: 30 g/dL (ref 30.0–36.0)
MCV: 109.3 fL — ABNORMAL HIGH (ref 80.0–100.0)
Platelets: 94 10*3/uL — ABNORMAL LOW (ref 150–400)
RBC: 3.66 MIL/uL — ABNORMAL LOW (ref 4.22–5.81)
RDW: 14.5 % (ref 11.5–15.5)
WBC: 7 10*3/uL (ref 4.0–10.5)
nRBC: 0 % (ref 0.0–0.2)

## 2018-02-07 LAB — BASIC METABOLIC PANEL
Anion gap: 7 (ref 5–15)
BUN: 27 mg/dL — ABNORMAL HIGH (ref 8–23)
CO2: 19 mmol/L — ABNORMAL LOW (ref 22–32)
CREATININE: 0.97 mg/dL (ref 0.61–1.24)
Calcium: 8.7 mg/dL — ABNORMAL LOW (ref 8.9–10.3)
Chloride: 128 mmol/L — ABNORMAL HIGH (ref 98–111)
GFR calc non Af Amer: >60 mL/min (ref >60)
Glucose, Bld: 131 mg/dL — ABNORMAL HIGH (ref 70–99)
Potassium: 3.9 mmol/L (ref 3.5–5.1)
Sodium: 154 mmol/L — ABNORMAL HIGH (ref 135–145)

## 2018-02-07 LAB — GLUCOSE, CAPILLARY
Glucose-Capillary: 124 mg/dL — ABNORMAL HIGH (ref 70–99)
Glucose-Capillary: 112 mg/dL — ABNORMAL HIGH (ref 70–99)
Glucose-Capillary: 150 mg/dL — ABNORMAL HIGH (ref 70–99)
Glucose-Capillary: 110 mg/dL — ABNORMAL HIGH (ref 70–99)
Glucose-Capillary: 114 mg/dL — ABNORMAL HIGH (ref 70–99)
Glucose-Capillary: 151 mg/dL — ABNORMAL HIGH (ref 70–99)

## 2018-02-07 LAB — SODIUM
Sodium: 155 mmol/L — ABNORMAL HIGH (ref 135–145)
Sodium: 155 mmol/L — ABNORMAL HIGH (ref 135–145)
Sodium: 157 mmol/L — ABNORMAL HIGH (ref 135–145)

## 2018-02-07 MED ORDER — FUROSEMIDE 10 MG/ML IJ SOLN
20.0000 mg | Freq: Every day | INTRAMUSCULAR | Status: DC
  Administered 2018-02-07 – 2018-02-08 (×2): 20 mg via INTRAVENOUS
  Filled 2018-02-07 (×2): qty 2

## 2018-02-07 NOTE — Progress Notes (Signed)
NAME:  Larry Rangel, MRN:  [id number], DOB:  August 09, 1950, LOS: 3 ADMISSION DATE:  2018/02/26, CONSULTATION DATE:  1/23 REFERRING MD:  Dr. Laurence Slate, CHIEF COMPLAINT:  CVA with hemorrhagic conversion.    Brief History   68 year old male admitted with L MCA CVA and taken to IR without successful intervention. Hemorrhagic conversion noted post-op. On vent in ICU.   History of present illness   68 year old male with past medical history as below, which is significant for coronary artery disease status post CABG in 2009, systolic congestive heart failure with a EF of 20 to 25%, type 2 diabetes, paroxysmal atrial fibrillation on Xarelto, and hypertension.  He presented to [facility] emergency department on 1/23 as a code stroke.  He was last known well at 7:55 PM while at work.  He had a witnessed onset of dysarthria and EMS was called and initiated code stroke.  Imaging of the brain in the emergency department were consistent with left MCA infarct.  He was taken to IR for attempted mechanical thrombectomy which was unfortunately unsuccessful.  Postoperative CT scan demonstrated hemorrhagic conversion.  He was transferred to the ICU on the ventilator.  PCCM was asked to consult.  Past Medical History   has a past medical history of Abnormal liver function (08/23/2010), AICD (automatic cardioverter/defibrillator) present, Anxiety, Arthritis, CAD (coronary artery disease), CHF (congestive heart failure) (HCC) (07/30/2013), Chicken pox (as a child), Chronic combined systolic and diastolic CHF (congestive heart failure) (HCC), Hyperlipidemia, Hypertension, LBBB (left bundle branch block), Low back pain, Measles (as a child), Mumps (as a child), Myocardial infarction (HCC) (2009), Paroxysmal atrial flutter (HCC) (07-2012), Preventative health care (12/10/2014), Tobacco user, and Type II diabetes mellitus (HCC).   Significant Hospital Events   1/23 admit  Consults:  IR PCCM  Procedures:  ETT 1/23 > Art line  1/23 > IR mechanical thrombectomy attempt 1/23 > L PICC 1/25 >>   Significant Diagnostic Tests:  CT head 1/23 >>> CTA/P 1/23 > Positive study for emergent large vessel occlusion, with occlusion of the distal left M1 segment. Little to no collateral flow seen distally within the left MCA distribution. Approximate 50% atheromatous stenosis at the proximal left ICA. Occluded vertebral artery within the neck, with irregular attenuated distal reconstitution just prior to the skull base. Parotid carotid siphon atherosclerosis with associated moderate diffuse narrowing, right worse than left. CT head 1/24 > Similar predominately LEFT extra-axial density compatible with contrast extravasation and hemorrhage with minimal extra-axial pneumocephalus. No definite intraparenchymal hemorrhage. CT head 1/25 >> worsening left MCA distribution cytotoxic edema with midline shift now 4 mm, partial clearing contrast in left SAH, no new bleeding  Micro Data:    Antimicrobials:    Interim history/subjective:  PICC placed to facilitate 3% saline  Objective   Blood pressure 131/69, pulse 66, temperature 98.4 F (36.9 C), temperature source Axillary, resp. rate 17, height 5\' 10"  (1.778 m), weight 93.4 kg, SpO2 100 %.    Vent Mode: CPAP;PSV FiO2 (%):  [40 %] 40 % Set Rate:  [16 bmp] 16 bmp Vt Set:  [580 mL] 580 mL PEEP:  [5 cmH20] 5 cmH20 Pressure Support:  [8 cmH20-10 cmH20] 8 cmH20 Plateau Pressure:  [10 cmH20-20 cmH20] 10 cmH20   Intake/Output Summary (Last 24 hours) at 02/07/2018 3154 Last data filed at 02/07/2018 0800 Gross per 24 hour  Intake 2195.52 ml  Output 775 ml  Net 1420.52 ml   Filed Weights   Feb 26, 2018 2338 02/06/18 0500 02/07/18 0442  Weight: 88.1 kg 92.9 kg 93.4 kg    Examination: General: Acutely ill-appearing man, ventilated, no distress HENT: ET tube in place, oropharynx clear, pupils equal and reactive Lungs: Clear bilaterally Cardiovascular: Irregularly irregular, no murmur,  no peripheral edema Abdomen: Soft, nondistended, positive bowel sounds Extremities: No deformity Neuro: Propofol is off.  He turns his head, has eyes open.  Not clear to me that he is following commands.  He had some spontaneous movement of his left leg, did not move his left arm today (did on 1/25)  Resolved Hospital Problem list   Acute renal failure, normalized 1/26  Assessment & Plan:   Acute L MCA CVA complicated by hemorrhagic conversion, cytotoxic edema -Appreciate neurology management.  Continue to follow for any evidence improvement, guide overall prognosis -SBP goal 100-140 mmHg, Cleviprex has been weaned to off -Neurochecks -PIC in place to continue 3% saline -Neurological prognosis guarded.  Family has requested to talk to palliative care service regarding goals of care.  His wife indicates that they do not believe he would want prolonged care if prognosis for meaningful recovery was poor  Acute respiratory failure due to CVA -Okay for spontaneous breathing but no plans for extubation given airway protection.  Unless there is a significant change neurologically he would likely need tracheostomy to protect airway -Minimize sedation as able, propofol -Follow intermittent chest x-ray  Paroxysmal atrial fibrillation -Off anticoagulation due to his SAH  Acute renal insufficiency, resolved -Follow urine output and BMP  Chronic HFrEF (LVEF 20-25%) -Carvedilol to start 1/26 -Cleviprex is off -Can likely restart home dose Lasix 1/26, 20 mg IV.  Follow sodium closely with this addition  DM2 -Holding metformin -Sliding scale insulin per protocol   Best practice:  Diet: NPO Pain/Anxiety/Delirium protocol (if indicated): Propofol VAP protocol (if indicated): Per protocol DVT prophylaxis: SCD GI prophylaxis: PPI Glucose control: SSI Mobility: BR Code Status: FULL Family Communication: Patient's wife and daughter updated at bedside on 1/26. Palliative Care  consulted Disposition: ICU  Labs   CBC: Recent Labs  Lab 01/21/2018 2045 02/05/18 0452 02/07/18 0439  WBC 9.2 13.3* 7.0  NEUTROABS 7.0 12.4*  --   HGB 14.4 13.2 12.0*  HCT 48.8 40.8 40.0  MCV 107.7* 102.5* 109.3*  PLT 138* 135* 94*    Basic Metabolic Panel: Recent Labs  Lab 01/18/2018 2045 01/19/2018 2100 02/05/18 0452  02/05/18 1545  02/06/18 0408 02/06/18 1056 02/06/18 1745 02/06/18 2230 02/07/18 0439  NA 137  --  137   < > 142   < > 149* 151* 156* 155* 154*  K 4.4  --  4.2  --   --   --   --   --   --   --  3.9  CL 108  --  109  --   --   --   --   --   --   --  128*  CO2 18*  --  19*  --   --   --   --   --   --   --  19*  GLUCOSE 117*  --  194*  --   --   --   --   --   --   --  131*  BUN 21  --  23  --   --   --   --   --   --   --  27*  CREATININE 1.36* 1.40* 1.40*  --   --   --   --   --   --   --  0.97  CALCIUM 9.3  --  8.8*  --   --   --   --   --   --   --  8.7*  MG  --   --   --   --  2.1  --  2.3  --  2.5*  --   --   PHOS  --   --   --   --  3.7  --  2.5  --  3.3  --   --    < > = values in this interval not displayed.   GFR: Estimated Creatinine Clearance: 82.5 mL/min (by C-G formula based on SCr of 0.97 mg/dL). Recent Labs  Lab 03-Jun-2018 2045 02/05/18 0452 02/07/18 0439  WBC 9.2 13.3* 7.0    Liver Function Tests: Recent Labs  Lab 03-Jun-2018 2045  AST 22  ALT 15  ALKPHOS 57  BILITOT 1.9*  PROT 6.7  ALBUMIN 3.7   No results for input(s): LIPASE, AMYLASE in the last 168 hours. No results for input(s): AMMONIA in the last 168 hours.  ABG    Component Value Date/Time   PHART 7.335 (L) 21-May-202020 0215   PCO2ART 39.2 21-May-202020 0215   PO2ART 233 (H) 21-May-202020 0215   HCO3 20.3 21-May-202020 0215   TCO2 22 08/01/2013 0945   ACIDBASEDEF 4.5 (H) 21-May-202020 0215   O2SAT 99.4 21-May-202020 0215     Coagulation Profile: Recent Labs  Lab 03-Jun-2018 2045  INR 2.13    Cardiac Enzymes: No results for input(s): CKTOTAL, CKMB, CKMBINDEX, TROPONINI in  the last 168 hours.  HbA1C: Hgb A1c MFr Bld  Date/Time Value Ref Range Status  02/05/2018 04:52 AM 5.1 4.8 - 5.6 % Final    Comment:    (NOTE) Pre diabetes:          5.7%-6.4% Diabetes:              >6.4% Glycemic control for   <7.0% adults with diabetes   12/01/2017 10:51 AM 5.1 4.6 - 6.5 % Final    Comment:    Glycemic Control Guidelines for People with Diabetes:Non Diabetic:  <6%Goal of Therapy: <7%Additional Action Suggested:  >8%     CBG: Recent Labs  Lab 02/06/18 1527 02/06/18 1949 02/06/18 2332 02/07/18 0345 02/07/18 0748  GLUCAP 105* 116* 132* 124* 112*    Independent critical care time 33 minutes  Levy Pupaobert Dawnita Molner, MD, PhD 02/07/2018, 9:22 AM [facility] [phone] or if no answer [phone]

## 2018-02-07 NOTE — Progress Notes (Signed)
STROKE TEAM PROGRESS NOTE   INTERVAL HISTORY His wife ad daughter is at the bedside.  No significant event overnight.  Patient seemed more calm than yesterday.  Still has global aphasia and right hemiplegia.  Able to open eyes on voice but not follow commands.  On weaning protocol.   Vitals:   02/07/18 1400 02/07/18 1500 02/07/18 1531 02/07/18 1600  BP: 136/81 127/81 127/81 (!) 141/76  Pulse: (!) 55 (!) 46 (!) 51 (!) 57  Resp: 16 16 16 16   Temp:      TempSrc:      SpO2: 100% 100% 100% 100%  Weight:      Height:        CBC:  Recent Labs  Lab 01/25/2018 2045 02/05/18 0452 02/07/18 0439  WBC 9.2 13.3* 7.0  NEUTROABS 7.0 12.4*  --   HGB 14.4 13.2 12.0*  HCT 48.8 40.8 40.0  MCV 107.7* 102.5* 109.3*  PLT 138* 135* 94*    Basic Metabolic Panel:  Recent Labs  Lab 02/05/18 0452  02/06/18 0408  02/06/18 1745  02/07/18 0439 02/07/18 1022  NA 137   < > 149*   < > 156*   < > 154* 155*  K 4.2  --   --   --   --   --  3.9  --   CL 109  --   --   --   --   --  128*  --   CO2 19*  --   --   --   --   --  19*  --   GLUCOSE 194*  --   --   --   --   --  131*  --   BUN 23  --   --   --   --   --  27*  --   CREATININE 1.40*  --   --   --   --   --  0.97  --   CALCIUM 8.8*  --   --   --   --   --  8.7*  --   MG  --    < > 2.3  --  2.5*  --   --   --   PHOS  --    < > 2.5  --  3.3  --   --   --    < > = values in this interval not displayed.   Lipid Panel:     Component Value Date/Time   CHOL 130 02/05/2018 0452   TRIG 148 02/05/2018 0452   TRIG 149 02/05/2018 0452   HDL 32 (L) 02/05/2018 0452   CHOLHDL 4.1 02/05/2018 0452   VLDL 30 02/05/2018 0452   LDLCALC 68 02/05/2018 0452   HgbA1c:  Lab Results  Component Value Date   HGBA1C 5.1 02/05/2018   Urine Drug Screen:     Component Value Date/Time   LABOPIA NONE DETECTED 01/29/2017 2327   COCAINSCRNUR NONE DETECTED 01/29/2017 2327   COCAINSCRNUR NEG 11/12/2012 1648   LABBENZ NONE DETECTED 01/29/2017 2327   LABBENZ NEG  11/12/2012 1648   AMPHETMU NONE DETECTED 01/29/2017 2327   THCU NONE DETECTED 01/29/2017 2327   LABBARB NONE DETECTED 01/29/2017 2327    Alcohol Level     Component Value Date/Time   ETH <10 08/23/2010 1551    IMAGING  Ct Angio Head W Or Wo Contrast Ct Angio Neck W Or Wo Contrast Ct Head Code Stroke Wo Contrast 01/30/2018 IMPRESSION:  CT HEAD  1. Evolving hypodensity involving the left insula and overlying supra ganglionic left cerebral hemisphere, evolving acute left MCA territory infarct. No intracranial hemorrhage.  2. Aspects = 7  3. Underlying age-related cerebral atrophy with mild chronic small vessel ischemic disease.   CTA HEAD AND NECK 1. Positive study for emergent large vessel occlusion, with occlusion of the distal left M1 segment. Little to no collateral flow seen distally within the left MCA distribution.  2. Approximate 50% atheromatous stenosis at the proximal left ICA.  3. Occluded vertebral artery within the neck, with irregular attenuated distal reconstitution just prior to the skull base.  4. Parotid carotid siphon atherosclerosis with associated moderate diffuse narrowing, right worse than left.  5. Small layering bilateral pleural effusions, partially visualized.    Ct Head Wo Contrast 02/06/2018 IMPRESSION:  1. Worsening left MCA distribution cytotoxic edema with increased midline shift now measuring 4 mm.  2. Partial clearing of extravasated contrast superimposed on left hemispheric subarachnoid blood. No new site of hemorrhage.    Ct Head Wo Contrast 02/05/2018 IMPRESSION:  1. Increased hemorrhage LEFT sylvian fissure density superimposed on extra-axial contrast extravasation and blood.  2. Increased conspicuity of LEFT insular and frontoparietal/MCA infarct. New minimal petechial hemorrhage versus contrast staining.    Ct Head Wo Contrast 2018-02-09 IMPRESSION:  1. Similar predominately LEFT extra-axial density compatible with contrast  extravasation and hemorrhage with minimal extra-axial pneumocephalus.  2. No definite intraparenchymal hemorrhage. Limited assessment for acute infarct.     Dg Chest Port 1 View 02/05/2018 IMPRESSION:  1. Endotracheal tube tip at the thoracic inlet approximately 6.6 cm above the carina distal repositioning should be considered. NG tube tip below left hemidiaphragm.  2. AICD noted stable position with lead over the midline. Prior CABG. Stable cardiomegaly. Mild interstitial prominence noted. Mild CHF can not be excluded.    Cerebral angiogram Feb 09, 2018 Lt MCA M1 occlusion with early recanalization.procedure aborted after extravasation of contrast noted following deployment of  A 69mm x 40 mm solitaire device associated with severe spasm. NO changes noted in BP or HR. Device recaptured into microcath and retrieved . immediate CT brain demonstrates contrast in the Lt perisylvian fissure and Lt temporal cerebral convexity. No mass effect or midline shift noted . No intraparenchymal  hemorrhage noted.  2D echocardiogram February 09, 2018 - Left ventricle: The cavity size was moderately dilated. Wall thickness was increased in a pattern of mild LVH. Systolic function was severely reduced. The estimated ejection fraction was in the range of 20% to 25%. Diffuse hypokinesis. - Mitral valve: Calcified annulus. There was mild regurgitation. - Left atrium: The atrium was severely dilated. - Right ventricle: The cavity size was mildly dilated. Systolic function was moderately reduced. - Right atrium: The atrium was moderately dilated. Impressions:  Definity used; severe global reduction in LV systolic function; mild LVH; 4 chamber enlargement; mild MR; moderate RV dysfunction.   PHYSICAL EXAM  Temp:  [97.8 F (36.6 C)-98.9 F (37.2 C)] 97.8 F (36.6 C) (01/26 1200) Pulse Rate:  [46-88] 57 (01/26 1600) Resp:  [15-24] 16 (01/26 1600) BP: (118-165)/(65-95) 141/76 (01/26 1600) SpO2:  [93 %-100 %] 100 %  (01/26 1600) Arterial Line BP: (138-180)/(60-81) 167/73 (01/26 1000) FiO2 (%):  [40 %] 40 % (01/26 1531) Weight:  [93.4 kg] 93.4 kg (01/26 0442)  General - Well nourished, well developed, intubated off sedation.  Ophthalmologic - fundi not visualized due to noncooperation.  Cardiovascular - irregularly irregular heart rate and rhythm.  Neuro - intubated off sedation,  eyes closed but able to open on voice but difficulty to maintain opening, not following commands. With forced eye opening, eyes in left gaze position, not blinking to visual threat to the right, doll's eyes present, but not tracking, PERRL. Corneal reflex present, gag and cough present. Breathing over the vent.  Facial symmetry not able to test due to ET tube.  Tongue midline in mouth. LUE spontaneous movement against gravity, on pain stimulation, LLE 3/5, RLE and RUE mild withdraw. DTR 1+ and positive babinski on the right. Sensation, coordination and gait not tested.   ASSESSMENT/PLAN Mr. TAVARAS NAHAS is a 68 y.o. male with history of CAD s/p CABG, systolic and diastolic CHF w/ ET 30-35%, AICD, HTN, HLD, AF on xarelto and DM presenting with aphasia and right-sided weakness.   Stroke:  left MCA infarct d/t L M1 occlusion s/p aborted IR d/t hemorrhage at time of device deployment, now w/ cerebral edema, subarachnoid hemorrhage. Infarct  Embolic secondary to known AF in setting of missed Xarelto doses  Code Stroke CT head evolving L insular/MCA territory infarct.     CTA head & neck L M1 occlusion w/o collaterals. L ICA 50%. Occluded VA in the neck. R>L ICA siphon atherosclerosis. Small layering B pleural effusion  Cerebral angio left MCA M1 occlusion with recanalization aborted after extravasation of contrast after Solitaire deployment for severe spasm  CT head 1/23 similar predominately large L extra-axial density c/w contrast extravasation. No definite hmg. limited ability to assess infarct  CT head 1/24 increased hgm L  sylvian fissure superimposed on extra-axial contrast extravasation and blood. Now able to see L insular and FP/MCA infarct. New petechial hmg vs contrast staining  CT head 1/25 Worsening left MCA distribution cytotoxic edema with increased midline shift now measuring 4 mm. Partial clearing of extravasated contrast superimposed on left hemispheric subarachnoid blood.  2D Echo EF 20 to 25% with diffuse hypokinesis.  Left atrium severely dilated.  Right atrium moderately dilated.  LDL 68  HgbA1c 5.1  SCDs for VTE prophylaxis  aspirin 81 mg daily, clopidogrel 75 mg daily and Xarelto (rivaroxaban) daily prior to admission (though wife thinks he missed 1-2 doses xarelto PTA), now on No antithrombotic given hemorrhage   Therapy recommendations:    Disposition:  pending   I again had long discussion w/ wife and dtr r/t severe stroke and potential neuro outcome. Family understands. Pending palliative care discussion for GOC.   Cerebral Edema Induced Hypernatremia  On 3% saline  PICC line requested  CT head showed worsening MLS at 54mm  Continue 3% saline  Goal Na 150-155   Check Na q 6h -> 149->151->155  Acute Respiratory Failure  Intubated  CCM onboard  Off sedation   Extubate as able   Atrial Fibrillation  Home anticoagulation:  Xarelto (rivaroxaban) daily , likely missed 1-2 doses prior to admission  No AC at this time given cerebral hemorrhage  Rate under control  Resume coreg low dose  CHF with cardiomyopathy  Chronic systolic and diastolic Congestive heart failure w/ baseline EF 30-35%  Status post AICD  EF 20 to 25% this admission  CCM on board  Coreg low-dose  Hypertension  Stable . goal SBP < 160 given ICH . Home meds - coreg, losartan and spironolactone . Resume half dose of coreg now . Off cleviprex  Hyperlipidemia  Home meds:  lipitor 80  Statin on hold given hemorrhage  LDL 68, goal < 70   Diabetes type II  HgbA1c 5.1, at goal <  7.0  Controlled  SSI  CBG monitoring  Dysphagia, secondary to acute stroke   N.p.o.  On tube feeding  Other Stroke Risk Factors  Advanced age  Former Cigarette smoker, quit 7 years ago  Coronary artery disease s/p CABG  Other Active Problems  Leukocytosis, WBC 9.2-13.3  Thrombocytopenia, PLT 135  CKD stage II creatinine 1.36-1.40-1.40  Low-grade fever T-max 100.2  Hospital day # 3  This patient is critically ill and at significant risk of neurological worsening, death and care requires constant monitoring of vital signs, hemodynamics,respiratory and cardiac monitoring, extensive review of multiple databases, frequent neurological assessment, discussion with family, other specialists and medical decision making of high complexity.I have made any additions or clarifications directly to the above note.This critical care time does not reflect procedure time, or teaching time or supervisory time of PA/NP/Med Resident etc but could involve care discussion time.  I spent 35 minutes of neurocritical care time  in the care of  this patient. I had long discussion with wife and daughter at bedside, updated pt current condition, treatment plan and potential prognosis. They expressed understanding and appreciation.   Marvel Plan, MD PhD Stroke Neurology 02/07/2018 6:45 PM  To contact Stroke Continuity provider, please refer to [email or web address]. After hours, contact General Neurology

## 2018-02-07 NOTE — Consult Note (Signed)
Consultation Note Date: 02/07/2018   Patient Name: Larry Rangel  DOB: Dec 09, 1950  MRN: [id number]  Age / Sex: 68 y.o., male  PCP: Mosie Lukes, MD Referring Physician: Rosalin Hawking, MD  Reason for Consultation: Establishing goals of care, Non pain symptom management, Pain control, Psychosocial/spiritual support, Terminal Care and Withdrawal of life-sustaining treatment  HPI/Patient Profile: 68 y.o. male  with past medical history of combined congestive heart failure with an EF of 30 to 35%, AICD, history of MI, coronary artery disease status post CABG 2009, hypertension, hyperlipidemia, atrial fib on anticoagulation, diabetes, admitted on 02/12/2018 with large left MCA stroke.  tPA was not given secondary to anti coagulation on board.  Patient went to IR for thrombectomy but this was not successful. He has since developed subarachnoid hemorrhage, left hemispheric edema with increased mass-effect, now with 4 mm right ward shift.  Repeat echocardiogram shows now further reduction in EF to 20 to 25%  Consulted for goals of care and potential withdrawal of life support.   Clinical Assessment and Goals of Care: Patient seen, chart reviewed.  Met with patient's wife, Larry Rangel and daughter Larry Rangel.  Both Larry Rangel and Levada Dy have expressed that their father/husband would not want to live in a debilitated state.  Per Lorenda Hatchet, CCM as well as neurology, neurosurgery has prepared them that due to the severity of the stroke, patient will be bedbound and dependent for all care and would require a tracheostomy as well as a PEG tube.  All family is in agreement the patient would not want these  He is a DNR.  Family has agreed to deactivate AICD today.  Plan is to liberate patient from life support on 02/08/2018.  Patient spouse, Larry Rangel is his healthcare proxy.  Her numbers are: [phone] cell phone; home  phone [phone] Larry Rangel, daughter, [phone]  Plan is for palliative medicine provider to call Mrs. Wooding in the morning and schedule a time to begin withdrawal of life support.  Patient's wife son and daughter wish to be present  I did discuss with family that potentially patient could die  soon after ventilator support is withdrawn or that he could have days.  Introduced broadly residential hospice as an option at that point.  Patient's mother passed away at [facility] and daughter Levada Dy, is familiar with that option should it be required    SUMMARY OF RECOMMENDATIONS   DNR Plan to discontinue life support on 02/08/2018.  Palliative medicine provider to call Mrs. Safranek and schedule a time in the morning Order written to deactivate AICD on 02/07/2018 Prepared family for tube feedings to be stopped as well as education on medications that are utilized to help ensure respiratory comfort as ventilator is withdrawn Code Status/Advance Care Planning:  DNR    Symptom Management:   Acute respiratory failure/shortness of breath: Discussed the use of opioids and managing discontinuation of ventilators to ensure respiratory comfort.  Patient's kidney function is good and morphine would be a good option.  Discussed initiating a morphine drip prior to removing ventilator to ensure respiratory comfort and family was in agreement  Anxiety/agitation: Reviewed medications and management of associated symptoms.  Discussed continuous infusion of Versed to ensure comfort  Secretions: Discussed use of Robinul  Palliative Prophylaxis:   Aspiration, Bowel Regimen, Delirium Protocol, Eye Care, Frequent Pain Assessment, Oral Care and Turn Reposition  Additional Recommendations (Limitations, Scope, Preferences):  Moving towards full comfort care on 02/08/2018  Psycho-social/Spiritual:   Desire for further Chaplaincy support:no  Additional Recommendations: Grief/Bereavement  Support  Prognosis:  Hours - Days once ventilator is removed  Disposition: Hospital death versus residential hospice      Primary Diagnoses: Present on Admission: . Middle cerebral artery embolism, left   I have reviewed the medical record, interviewed the patient and family, and examined the patient. The following aspects are pertinent.  Past Medical History:  Diagnosis Date  . Abnormal liver function 08/23/2010  . AICD (automatic cardioverter/defibrillator) present   . Anxiety   . Arthritis    "head to toe" (01/29/2017)  . CAD (coronary artery disease)    a. s/p CABG 2009. b. Cath 07/2013: 3/5 patent grafts (appear to have chronic occ grafts), mod diag/L PL branch, did not appear flow limiting, elevated filling pressures.  . CHF (congestive heart failure) ([place]) 07/30/2013  . Chicken pox as a child  . Chronic combined systolic and diastolic CHF (congestive heart failure) ([place])    a. Echo 6/14: Mild LVH, EF 30-35%, diffuse HK, MAC, mild BAE. b. Drop in EF to 20% by echo 07/2013.  Marland Kitchen Hyperlipidemia   . Hypertension   . LBBB (left bundle branch block)   . Low back pain   . Measles as a child  . Mumps as a child  . Myocardial infarction ([place]) 2009  . Paroxysmal atrial flutter ([place]) 07-2012   a. s/p ablation by Dr [name] 08-06-2012. b. Recurrence 07/2013.   Marland Kitchen Preventative health care 12/10/2014  . Tobacco user   . Type II diabetes mellitus ([place])    Social History   Socioeconomic History  . Marital status: Married    Spouse name: Not on file  . Number of children: Not on file  . Years of education: Not on file  . Highest education level: Not on file  Occupational History  . Not on file  Social Needs  . Financial resource strain: Not on file  . Food insecurity:    Worry: Not on file    Inability: Not on file  . Transportation needs:    Medical: Not on file    Non-medical: Not on file  Tobacco Use  . Smoking status: Former Smoker    Packs/day: 1.00    Years: 40.00     Pack years: 40.00    Types: Cigarettes    Last attempt to quit: 02/01/2011    Years since quitting: 7.0  . Smokeless tobacco: Never Used  Substance and Sexual Activity  . Alcohol use: Yes    Alcohol/week: 12.0 standard drinks    Types: 12 Glasses of wine per week  . Drug use: No  . Sexual activity: Yes    Birth control/protection: Coitus interruptus  Lifestyle  . Physical activity:    Days per week: Not on file    Minutes per session: Not on file  . Stress: Not on file  Relationships  . Social connections:    Talks on phone: Not on file    Gets together: Not on file    Attends  religious service: Not on file    Active member of club or organization: Not on file    Attends meetings of clubs or organizations: Not on file    Relationship status: Not on file  Other Topics Concern  . 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   He has been married four times previously, the first  For 71yr and has two children by the marriage which are grown, the second time for 11 yrs and has 2 by that marriage.   Wife has custody in [place]   The third marriage was 4 1/2 yrs and most recent marriage was the past eight weeks and he is currently estranged from that person.   Family History  Problem Relation Age of Onset  . Alzheimer's disease Mother   . Heart failure Father 638 . Hypertension Father   . Hyperlipidemia Father   . Cancer Father        lung- took half of a young- smoker  . Heart disease Father        MI at 568 . Leukemia Brother   . Drug abuse Daughter        heroine  . Early death Neg Hx   . Kidney disease Neg Hx   . Stroke Neg Hx    Scheduled Meds: . carvedilol  6.25 mg Per Tube BID WC  . chlorhexidine gluconate (MEDLINE KIT)  15 mL Mouth Rinse BID  . Chlorhexidine Gluconate Cloth  6 each Topical Daily  . feeding supplement  (PRO-STAT SUGAR FREE 64)  30 mL Per Tube BID  . furosemide  20 mg Intravenous Daily  . insulin aspart  0-15 Units Subcutaneous Q4H  . mouth rinse  15 mL Mouth Rinse 10 times per day  . multivitamin  15 mL Per Tube Daily  . mupirocin ointment   Nasal BID  . pantoprazole (PROTONIX) IV  40 mg Intravenous Q24H  . sodium chloride flush  10-40 mL Intracatheter Q12H   Continuous Infusions: . clevidipine Stopped (02/06/18 1310)  . feeding supplement (VITAL HIGH PROTEIN) 1,000 mL (02/06/18 1737)  . propofol (DIPRIVAN) infusion Stopped (02/07/18 0823)  . sodium chloride (hypertonic) Stopped (02/06/18 2032)   PRN Meds:.acetaminophen **OR** acetaminophen (TYLENOL) oral liquid 160 mg/5 mL **OR** acetaminophen, fentaNYL (SUBLIMAZE) injection, fentaNYL (SUBLIMAZE) injection, labetalol, midazolam, midazolam, senna-docusate, sodium chloride flush Medications Prior to Admission:  Prior to Admission medications   Medication Sig Start Date End Date Taking? Authorizing Provider  busPIRone (BUSPAR) 10 MG tablet TAKE 1 TABLET BY MOUTH TWICE A DAY Patient taking differently: Take 10 mg by mouth 2 (two) times daily.  07/31/17  Yes BMosie Lukes MD  carvedilol (COREG) 12.5 MG tablet TAKE 1 TABLET BY MOUTH TWICE DAILY WITH A MEAL Patient taking differently: Take 12.5 mg by mouth 2 (two) times daily with a meal.  02/05/18  Yes KDeboraha Sprang MD  furosemide (LASIX) 40 MG tablet Take 1 tablet (40 mg total) by mouth daily. 02/03/18  Yes Barrett, REvelene Croon PA-C  glipiZIDE (GLUCOTROL) 5 MG tablet TAKE 0.5 TABLETS (2.5 MG TOTAL) BY MOUTH DAILY BEFORE BREAKFAST. 12/29/17  Yes BMosie Lukes MD  metFORMIN (GLUCOPHAGE) 500 MG tablet Take 1 tablet (500 mg total) by mouth 2 (two) times daily with a meal. 08/16/17  Yes Strader, BT[place]M, PA-C  nitroGLYCERIN (NITROSTAT) 0.4 MG  SL tablet Place 1 tablet (0.4 mg total) under the tongue every 5 (five) minutes x 3 doses as needed for chest pain. 08/15/17  Yes Strader, Bradley,  PA-C  rivaroxaban (XARELTO) 20 MG TABS tablet TAKE 1 TABLET BY MOUTH EVERY DAY WITH SUPPER Patient taking differently: Take 20 mg by mouth daily with supper.  02/01/18  Yes Deboraha Sprang, MD  spironolactone (ALDACTONE) 25 MG tablet TAKE 1/2 TABLET BY MOUTH DAILY Patient taking differently: Take 12.5 mg by mouth daily.  10/12/17  Yes Barrett, Evelene Croon, PA-C  albuterol (PROVENTIL HFA;VENTOLIN HFA) 108 (90 BASE) MCG/ACT inhaler Inhale 2 puffs into the lungs every 6 (six) hours as needed for wheezing or shortness of breath. Patient not taking: Reported on 02/05/2018 12/05/14   Mosie Lukes, MD  aspirin EC 81 MG EC tablet Take 1 tablet (81 mg total) by mouth daily. Stop taking on 09/13/2017. Patient not taking: Reported on 02/05/2018 08/16/17   Ahmed Prima, Fransisco Hertz, PA-C  atorvastatin (LIPITOR) 80 MG tablet Take 1 tablet (80 mg total) by mouth daily at 6 PM. 08/15/17   Strader, [place] M, PA-C  clonazePAM (KLONOPIN) 0.5 MG tablet TAKE 1 TABLET BY MOUTH THREE TIMES A DAY AS NEEDED Patient taking differently: Take 0.5 mg by mouth 3 (three) times daily as needed for anxiety.  04/30/17   Mosie Lukes, MD  clopidogrel (PLAVIX) 75 MG tablet Take 1 tablet (75 mg total) by mouth daily. 08/16/17   Strader, Fransisco Hertz, PA-C  losartan (COZAAR) 50 MG tablet Take 1 tablet (50 mg total) by mouth daily. 08/16/17   Strader, Fransisco Hertz, PA-C   No Known Allergies Review of Systems  Unable to perform ROS: Intubated    Physical Exam Constitutional:      Appearance: He is normal weight. He is ill-appearing.  HENT:     Head: Normocephalic and atraumatic.  Cardiovascular:     Rate and Rhythm: Normal rate.  Pulmonary:     Comments: On ventilator Genitourinary:    Comments: Foley Musculoskeletal:     Comments: Complete right-sided neglect and paresis  Skin:    General: Skin is warm and dry.  Neurological:     Comments: Complete right-sided neglect with paresis  Psychiatric:     Comments: Intermittent agitation  requiring propofol but currently off sedation and calm     Vital Signs: BP (!) 142/74   Pulse 65   Temp 97.8 F (36.6 C) (Axillary)   Resp 16   Ht 5' 10" (1.778 m)   Wt 93.4 kg   SpO2 100%   BMI 29.54 kg/m  Pain Scale: CPOT       SpO2: SpO2: 100 % O2 Device:SpO2: 100 % O2 Flow Rate: .   IO: Intake/output summary:   Intake/Output Summary (Last 24 hours) at 02/07/2018 1333 Last data filed at 02/07/2018 1200 Gross per 24 hour  Intake 1603.81 ml  Output 1675 ml  Net -71.19 ml    LBM: Last BM Date: (pta) Baseline Weight: Weight: 89.4 kg Most recent weight: Weight: 93.4 kg     Palliative Assessment/Data:   Flowsheet Rows     Most Recent Value  Intake Tab  Referral Department  Critical care  Unit at Time of Referral  ICU  Palliative Care Primary Diagnosis  Neurology  Date Notified  02/06/18  Reason for referral  Clarify Goals of Care, End of Life Care Assistance, Psychosocial or Spiritual support  Date of Admission  02/09/2018  Date first seen by Palliative  Care  02/07/18  # of days Palliative referral response time  1 Day(s)  # of days IP prior to Palliative referral  2  Clinical Assessment  Palliative Performance Scale Score  20%  Pain Max last 24 hours  Not able to report  Pain Min Last 24 hours  Not able to report  Dyspnea Max Last 24 Hours  Not able to report  Dyspnea Min Last 24 hours  Not able to report  Nausea Max Last 24 Hours  Not able to report  Nausea Min Last 24 Hours  Not able to report  Anxiety Max Last 24 Hours  Not able to report  Anxiety Min Last 24 Hours  Not able to report  Other Max Last 24 Hours  Not able to report  Psychosocial & Spiritual Assessment  Palliative Care Outcomes  Patient/Family meeting held?  Yes  Who was at the meeting?  wife Vanessa Ralphs  Patient/Family wishes: Interventions discontinued/not started   Hemodialysis, Trach, PEG, Tube feedings/TPN  Palliative Care follow-up planned  Yes, Facility      Time In:  1200 Time Out: 1315 Time Total: 75 min Greater than 50%  of this time was spent counseling and coordinating care related to the above assessment and plan.  Signed by: Dory Horn, NP   Please contact Palliative Medicine Team phone at [phone] for questions and concerns.  For individual provider: See Shea Evans

## 2018-02-08 DIAGNOSIS — J9601 Acute respiratory failure with hypoxia: Secondary | ICD-10-CM

## 2018-02-08 DIAGNOSIS — Z7189 Other specified counseling: Secondary | ICD-10-CM

## 2018-02-08 DIAGNOSIS — Z515 Encounter for palliative care: Secondary | ICD-10-CM

## 2018-02-08 LAB — BASIC METABOLIC PANEL
ANION GAP: 3 — AB (ref 5–15)
BUN: 34 mg/dL — ABNORMAL HIGH (ref 8–23)
CO2: 24 mmol/L (ref 22–32)
Calcium: 9 mg/dL (ref 8.9–10.3)
Chloride: 130 mmol/L — ABNORMAL HIGH (ref 98–111)
Creatinine, Ser: 1.06 mg/dL (ref 0.61–1.24)
GFR calc Af Amer: >60 mL/min (ref >60)
GFR calc non Af Amer: >60 mL/min (ref >60)
Glucose, Bld: 124 mg/dL — ABNORMAL HIGH (ref 70–99)
Potassium: 3.6 mmol/L (ref 3.5–5.1)
Sodium: 157 mmol/L — ABNORMAL HIGH (ref 135–145)

## 2018-02-08 LAB — CBC
HCT: 40.7 % (ref 39.0–52.0)
Hemoglobin: 12.2 g/dL — ABNORMAL LOW (ref 13.0–17.0)
MCH: 33.2 pg (ref 26.0–34.0)
MCHC: 30 g/dL (ref 30.0–36.0)
MCV: 110.6 fL — ABNORMAL HIGH (ref 80.0–100.0)
NRBC: 0 % (ref 0.0–0.2)
PLATELETS: 105 10*3/uL — AB (ref 150–400)
RBC: 3.68 MIL/uL — ABNORMAL LOW (ref 4.22–5.81)
RDW: 14.1 % (ref 11.5–15.5)
WBC: 6.8 10*3/uL (ref 4.0–10.5)

## 2018-02-08 LAB — MAGNESIUM: Magnesium: 2.4 mg/dL (ref 1.7–2.4)

## 2018-02-08 LAB — GLUCOSE, CAPILLARY
Glucose-Capillary: 108 mg/dL — ABNORMAL HIGH (ref 70–99)
Glucose-Capillary: 126 mg/dL — ABNORMAL HIGH (ref 70–99)

## 2018-02-08 MED ORDER — MIDAZOLAM 50MG/50ML (1MG/ML) PREMIX INFUSION
0.5000 mg/h | INTRAVENOUS | Status: DC
  Administered 2018-02-08 – 2018-02-09 (×2): 0.5 mg/h via INTRAVENOUS
  Filled 2018-02-08 (×2): qty 50

## 2018-02-08 MED ORDER — HALOPERIDOL LACTATE 5 MG/ML IJ SOLN: 0.5000 mg | INTRAMUSCULAR | Status: DC | PRN

## 2018-02-08 MED ORDER — GLYCOPYRROLATE 0.2 MG/ML IJ SOLN: 0.2000 mg | INTRAMUSCULAR | Status: DC | PRN

## 2018-02-08 MED ORDER — GLYCOPYRROLATE 0.2 MG/ML IJ SOLN
0.2000 mg | INTRAMUSCULAR | Status: DC | PRN
  Administered 2018-02-08 – 2018-02-09 (×3): 0.2 mg via INTRAVENOUS
  Filled 2018-02-08 (×3): qty 1

## 2018-02-08 MED ORDER — HALOPERIDOL 0.5 MG PO TABS
0.5000 mg | ORAL_TABLET | ORAL | Status: DC | PRN
  Filled 2018-02-08: qty 1

## 2018-02-08 MED ORDER — BIOTENE DRY MOUTH MT LIQD: 15.0000 mL | OROMUCOSAL | Status: DC | PRN

## 2018-02-08 MED ORDER — DIPHENHYDRAMINE HCL 50 MG/ML IJ SOLN: 25.0000 mg | INTRAMUSCULAR | Status: DC | PRN

## 2018-02-08 MED ORDER — MORPHINE BOLUS VIA INFUSION
2.0000 mg | INTRAVENOUS | Status: DC | PRN
  Filled 2018-02-08: qty 2

## 2018-02-08 MED ORDER — MIDAZOLAM BOLUS VIA INFUSION
2.0000 mg | INTRAVENOUS | Status: DC | PRN
  Filled 2018-02-08: qty 2

## 2018-02-08 MED ORDER — HALOPERIDOL LACTATE 2 MG/ML PO CONC
0.5000 mg | ORAL | Status: DC | PRN
  Filled 2018-02-08: qty 0.3

## 2018-02-08 MED ORDER — ONDANSETRON 4 MG PO TBDP: 4.0000 mg | ORAL_TABLET | Freq: Four times a day (QID) | ORAL | Status: DC | PRN

## 2018-02-08 MED ORDER — MORPHINE 100MG IN NS 100ML (1MG/ML) PREMIX INFUSION
4.0000 mg/h | INTRAVENOUS | Status: DC
  Administered 2018-02-08: 2 mg/h via INTRAVENOUS
  Administered 2018-02-09: 4 mg/h via INTRAVENOUS
  Filled 2018-02-08 (×2): qty 100

## 2018-02-08 MED ORDER — POLYVINYL ALCOHOL 1.4 % OP SOLN
1.0000 [drp] | Freq: Four times a day (QID) | OPHTHALMIC | Status: DC | PRN
  Filled 2018-02-08: qty 15

## 2018-02-08 MED ORDER — GLYCOPYRROLATE 1 MG PO TABS
1.0000 mg | ORAL_TABLET | ORAL | Status: DC | PRN
  Filled 2018-02-08: qty 1

## 2018-02-08 MED ORDER — ONDANSETRON HCL 4 MG/2ML IJ SOLN: 4.0000 mg | Freq: Four times a day (QID) | INTRAMUSCULAR | Status: DC | PRN

## 2018-02-08 NOTE — Progress Notes (Signed)
   02/08/18 1100  Clinical Encounter Type  Visited With Patient and family together;Health care provider;Family  Visit Type Initial;Psychological support;Spiritual support;Patient actively dying  Referral From Family;Palliative care team  Spiritual Encounters  Spiritual Needs Prayer;Emotional;Grief support  Stress Factors  Patient Stress Factors Not reviewed  Family Stress Factors Loss of control;Major life changes;Family relationships   Responded to consult from Palliative APP for terminal extubation.    Met pt's wife and spoke w/ her in consult rm to discuss her spiritual concerns.  Prayed w/ her.  All but nursing staff exited room for a time to allow change to a different type of catheter.  Ck'd w/ family again.  When all returned to room for extubation w/ med team, prayed w/ family around pt's bed, remained for a bit for add'l support, then left to give them privacy.  Had let family know to pg chaplain if they desired chaplain return.    Myra Gianotti resident, [phone]

## 2018-02-08 NOTE — Progress Notes (Signed)
NAME:  Larry Rangel, MRN:  [id number], DOB:  [birth year], LOS: 4 ADMISSION DATE:  Feb 20, 2018, CONSULTATION DATE:  1/23 REFERRING MD:  [place], CHIEF COMPLAINT:  CVA   Brief History   68 y/o male admitted with L MCA CVA on 1/23, went to IR for intervention, had hemorrhagic conversion after procedure.    Past Medical History  CAD, Systolic heart failure, AICD in place, HTN, Hyperlipidemia, Atrial fibrillation, DM2  Significant Hospital Events   1/23 admission  Consults:  IR PCCM  Procedures:  ETT 1/23 >  Arterial line 1/23 >  IR mechanical thrombectomy attempt 1/23 >  L PICC 1/25 >   Significant Diagnostic Tests:  CT head 1/23 >>> CTA/P 1/23 > Positive study for emergent large vessel occlusion, with occlusion of the distal left M1 segment. Little to no collateral flow seen distally within the left MCA distribution. Approximate 50% atheromatous stenosis at the proximal left ICA. Occluded vertebral artery within the neck, with irregular attenuated distal reconstitution just prior to the skull base. Parotid carotid siphon atherosclerosis with associated moderate diffuse narrowing, right worse than left. CT head 1/24 > Similar predominately LEFT extra-axial density compatible with contrast extravasation and hemorrhage with minimal extra-axial pneumocephalus. No definite intraparenchymal hemorrhage. CT head 1/25 >> worsening left MCA distribution cytotoxic edema with midline shift now 4 mm, partial clearing contrast in left SAH, no new bleeding  Micro Data:    Antimicrobials:     Interim history/subjective:   Plan to withdraw care on 1/27  Objective   Blood pressure 135/84, pulse 80, temperature 98.9 F (37.2 C), temperature source Axillary, resp. rate 16, height 5\' 10"  (1.778 m), weight 93.6 kg, SpO2 100 %.    Vent Mode: PRVC FiO2 (%):  [40 %] 40 % Set Rate:  [16 bmp] 16 bmp Vt Set:  [580 mL] 580 mL PEEP:  [5 cmH20] 5 cmH20 Pressure Support:  [8 cmH20] 8 cmH20 Plateau  Pressure:  [13 cmH20-19 cmH20] 18 cmH20   Intake/Output Summary (Last 24 hours) at 02/08/2018 1053 Last data filed at 02/08/2018 0700 Gross per 24 hour  Intake 1199.32 ml  Output 1735 ml  Net -535.68 ml   Filed Weights   02/06/18 0500 02/07/18 0442 02/08/18 0512  Weight: 92.9 kg 93.4 kg 93.6 kg    Examination:  General:  In bed on vent HENT: NCAT ETT in place PULM: CTA B, vent supported breathing CV: RRR, no mgr GI: BS+, soft, nontender MSK: normal bulk and tone Neuro: sedated on vent    Resolved Hospital Problem list   AKI  Assessment & Plan:  Acute L MCA CVA complicated by hemorrhagic conversion, cytotoxic edema > plan withdrawal of care  Acute respiratory failure with hypoxemia > plan one way extubation > mouth care after extubation > morphine infusion for comfort  Paroxysmal Atrial Fib > plan to have ICD cut off prior to extubation, coordinating with EP/device rep now  Chronic systolic heart failure > d/c home meds > cmofort measures  DM2 > stop accuchecks/glucose  Discussed with neurology and palliative medicine, appreciate palliative support   Best practice:  Family: updated family bedside, they are in agreement with plan of care Code: DNR  Labs   CBC: Recent Labs  Lab February 20, 2018 2045 02/05/18 0452 02/07/18 0439 02/08/18 0508  WBC 9.2 13.3* 7.0 6.8  NEUTROABS 7.0 12.4*  --   --   HGB 14.4 13.2 12.0* 12.2*  HCT 48.8 40.8 40.0 40.7  MCV 107.7* 102.5* 109.3* 110.6*  PLT 138*  135* 94* 105*    Basic Metabolic Panel: Recent Labs  Lab 07/18/2018 2045 07/18/2018 2100 02/05/18 0452  02/05/18 1545  02/06/18 0408  02/06/18 1745  02/07/18 0439 02/07/18 1022 02/07/18 1719 02/07/18 2203 02/08/18 0508  NA 137  --  137   < > 142   < > 149*   < > 156*   < > 154* 155* 155* 157* 157*  K 4.4  --  4.2  --   --   --   --   --   --   --  3.9  --   --   --  3.6  CL 108  --  109  --   --   --   --   --   --   --  128*  --   --   --  130*  CO2 18*  --  19*   --   --   --   --   --   --   --  19*  --   --   --  24  GLUCOSE 117*  --  194*  --   --   --   --   --   --   --  131*  --   --   --  124*  BUN 21  --  23  --   --   --   --   --   --   --  27*  --   --   --  34*  CREATININE 1.36* 1.40* 1.40*  --   --   --   --   --   --   --  0.97  --   --   --  1.06  CALCIUM 9.3  --  8.8*  --   --   --   --   --   --   --  8.7*  --   --   --  9.0  MG  --   --   --   --  2.1  --  2.3  --  2.5*  --   --   --   --   --  2.4  PHOS  --   --   --   --  3.7  --  2.5  --  3.3  --   --   --   --   --   --    < > = values in this interval not displayed.   GFR: Estimated Creatinine Clearance: 75.5 mL/min (by C-G formula based on SCr of 1.06 mg/dL). Recent Labs  Lab 07/18/2018 2045 02/05/18 0452 02/07/18 0439 02/08/18 0508  WBC 9.2 13.3* 7.0 6.8    Liver Function Tests: Recent Labs  Lab 07/18/2018 2045  AST 22  ALT 15  ALKPHOS 57  BILITOT 1.9*  PROT 6.7  ALBUMIN 3.7   No results for input(s): LIPASE, AMYLASE in the last 168 hours. No results for input(s): AMMONIA in the last 168 hours.  ABG    Component Value Date/Time   PHART 7.335 (L) 07/05/202020 0215   PCO2ART 39.2 07/05/202020 0215   PO2ART 233 (H) 07/05/202020 0215   HCO3 20.3 07/05/202020 0215   TCO2 22 08/01/2013 0945   ACIDBASEDEF 4.5 (H) 07/05/202020 0215   O2SAT 99.4 07/05/202020 0215     Coagulation Profile: Recent Labs  Lab 07/18/2018 2045  INR 2.13    Cardiac Enzymes: No results for input(s): CKTOTAL, CKMB,  CKMBINDEX, TROPONINI in the last 168 hours.  HbA1C: Hgb A1c MFr Bld  Date/Time Value Ref Range Status  02/05/2018 04:52 AM 5.1 4.8 - 5.6 % Final    Comment:    (NOTE) Pre diabetes:          5.7%-6.4% Diabetes:              >6.4% Glycemic control for   <7.0% adults with diabetes   12/01/2017 10:51 AM 5.1 4.6 - 6.5 % Final    Comment:    Glycemic Control Guidelines for People with Diabetes:Non Diabetic:  <6%Goal of Therapy: <7%Additional Action Suggested:  >8%      CBG: Recent Labs  Lab 02/07/18 1538 02/07/18 1920 02/07/18 2321 02/08/18 0332 02/08/18 0753  GLUCAP 110* 114* 151* 108* 126*     Critical care time: 30 minutes      Heber Weston, MD  PCCM Pager: [phone] Cell: [phone] If no response, call [phone]

## 2018-02-08 NOTE — Progress Notes (Signed)
PT Cancellation Note  Patient Details Name: Larry Rangel MRN: [id number] DOB: 10-23-1950   Cancelled Treatment:    Reason Eval/Treat Not Completed: Patient not medically ready(Noted Palliative cares note.  Plan is for one way extubation today, and transition to comfort/Hospice)   Shelden Raborn B Giancarlos Berendt 02/08/2018, 6:59 AM  Delaney Meigs, PT Acute Rehabilitation Services Pager: [phone] Office: [phone]

## 2018-02-08 NOTE — Progress Notes (Signed)
eLink Physician-Brief Progress Note Patient Name: Larry Rangel DOB: May 28, 1950 MRN: [id number]   Date of Service  02/08/2018  HPI/Events of Note  Request to transfer patient to palliative care bed.   eICU Interventions  Will transfer patient to palliative care bed.      Intervention Category Major Interventions: Other:  [name] 02/08/2018, 9:55 PM

## 2018-02-08 NOTE — Progress Notes (Signed)
Nutrition Brief Note  Chart reviewed. Pt now transitioning to comfort care.  No further nutrition interventions warranted at this time.  Please re-consult as needed.   Taris Galindo RD, LDN, CNSC [phone] Pager [phone] After Hours Pager    

## 2018-02-08 NOTE — Progress Notes (Signed)
STROKE TEAM PROGRESS NOTE   INTERVAL HISTORY His daughter and other family members are at the bedside.  No significant event overnight.  Palliative care involved and family would like comfort care measures.   Vitals:   02/08/18 0822 02/08/18 0900 02/08/18 1000 02/08/18 1100  BP: (!) 143/84 (!) 145/88 135/84 136/71  Pulse: 69 72 80 78  Resp:  16 16 16   Temp: 98.9 F (37.2 C)     TempSrc: Axillary     SpO2:  100% 100% 100%  Weight:      Height:        CBC:  Recent Labs  Lab 01/19/2018 2045 02/05/18 0452 02/07/18 0439 02/08/18 0508  WBC 9.2 13.3* 7.0 6.8  NEUTROABS 7.0 12.4*  --   --   HGB 14.4 13.2 12.0* 12.2*  HCT 48.8 40.8 40.0 40.7  MCV 107.7* 102.5* 109.3* 110.6*  PLT 138* 135* 94* 105*    Basic Metabolic Panel:  Recent Labs  Lab 02/06/18 0408  02/06/18 1745  02/07/18 0439  02/07/18 2203 02/08/18 0508  NA 149*   < > 156*   < > 154*   < > 157* 157*  K  --   --   --   --  3.9  --   --  3.6  CL  --   --   --   --  128*  --   --  130*  CO2  --   --   --   --  19*  --   --  24  GLUCOSE  --   --   --   --  131*  --   --  124*  BUN  --   --   --   --  27*  --   --  34*  CREATININE  --   --   --   --  0.97  --   --  1.06  CALCIUM  --   --   --   --  8.7*  --   --  9.0  MG 2.3  --  2.5*  --   --   --   --  2.4  PHOS 2.5  --  3.3  --   --   --   --   --    < > = values in this interval not displayed.   Lipid Panel:     Component Value Date/Time   CHOL 130 02/05/2018 0452   TRIG 148 02/05/2018 0452   TRIG 149 02/05/2018 0452   HDL 32 (L) 02/05/2018 0452   CHOLHDL 4.1 02/05/2018 0452   VLDL 30 02/05/2018 0452   LDLCALC 68 02/05/2018 0452   HgbA1c:  Lab Results  Component Value Date   HGBA1C 5.1 02/05/2018   Urine Drug Screen:     Component Value Date/Time   LABOPIA NONE DETECTED 01/29/2017 2327   COCAINSCRNUR NONE DETECTED 01/29/2017 2327   COCAINSCRNUR NEG 11/12/2012 1648   LABBENZ NONE DETECTED 01/29/2017 2327   LABBENZ NEG 11/12/2012 1648   AMPHETMU  NONE DETECTED 01/29/2017 2327   THCU NONE DETECTED 01/29/2017 2327   LABBARB NONE DETECTED 01/29/2017 2327    Alcohol Level     Component Value Date/Time   ETH <10 08/23/2010 1551    IMAGING  Ct Angio Head W Or Wo Contrast Ct Angio Neck W Or Wo Contrast Ct Head Code Stroke Wo Contrast 01/19/2018 IMPRESSION:   CT HEAD  1. Evolving hypodensity involving the left insula  and overlying supra ganglionic left cerebral hemisphere, evolving acute left MCA territory infarct. No intracranial hemorrhage.  2. Aspects = 7  3. Underlying age-related cerebral atrophy with mild chronic small vessel ischemic disease.   CTA HEAD AND NECK 1. Positive study for emergent large vessel occlusion, with occlusion of the distal left M1 segment. Little to no collateral flow seen distally within the left MCA distribution.  2. Approximate 50% atheromatous stenosis at the proximal left ICA.  3. Occluded vertebral artery within the neck, with irregular attenuated distal reconstitution just prior to the skull base.  4. Parotid carotid siphon atherosclerosis with associated moderate diffuse narrowing, right worse than left.  5. Small layering bilateral pleural effusions, partially visualized.    Ct Head Wo Contrast 02/06/2018 IMPRESSION:  1. Worsening left MCA distribution cytotoxic edema with increased midline shift now measuring 4 mm.  2. Partial clearing of extravasated contrast superimposed on left hemispheric subarachnoid blood. No new site of hemorrhage.    Ct Head Wo Contrast 02/05/2018 IMPRESSION:  1. Increased hemorrhage LEFT sylvian fissure density superimposed on extra-axial contrast extravasation and blood.  2. Increased conspicuity of LEFT insular and frontoparietal/MCA infarct. New minimal petechial hemorrhage versus contrast staining.    Ct Head Wo Contrast 03-03-2018 IMPRESSION:  1. Similar predominately LEFT extra-axial density compatible with contrast extravasation and hemorrhage with  minimal extra-axial pneumocephalus.  2. No definite intraparenchymal hemorrhage. Limited assessment for acute infarct.     Dg Chest Port 1 View 02/05/2018 IMPRESSION:  1. Endotracheal tube tip at the thoracic inlet approximately 6.6 cm above the carina distal repositioning should be considered. NG tube tip below left hemidiaphragm.  2. AICD noted stable position with lead over the midline. Prior CABG. Stable cardiomegaly. Mild interstitial prominence noted. Mild CHF can not be excluded.    Cerebral angiogram 03-Mar-2018 Lt MCA M1 occlusion with early recanalization.procedure aborted after extravasation of contrast noted following deployment of  A 68mm x 40 mm solitaire device associated with severe spasm. NO changes noted in BP or HR. Device recaptured into microcath and retrieved . immediate CT brain demonstrates contrast in the Lt perisylvian fissure and Lt temporal cerebral convexity. No mass effect or midline shift noted . No intraparenchymal  hemorrhage noted.  2D echocardiogram 03-03-2018 - Left ventricle: The cavity size was moderately dilated. Wall thickness was increased in a pattern of mild LVH. Systolic function was severely reduced. The estimated ejection fraction was in the range of 20% to 25%. Diffuse hypokinesis. - Mitral valve: Calcified annulus. There was mild regurgitation. - Left atrium: The atrium was severely dilated. - Right ventricle: The cavity size was mildly dilated. Systolic function was moderately reduced. - Right atrium: The atrium was moderately dilated. Impressions:  Definity used; severe global reduction in LV systolic function; mild LVH; 4 chamber enlargement; mild MR; moderate RV dysfunction.   PHYSICAL EXAM  Temp:  [97.3 F (36.3 C)-99.6 F (37.6 C)] 98.9 F (37.2 C) (01/27 0822) Pulse Rate:  [46-80] 78 (01/27 1100) Resp:  [15-18] 16 (01/27 1100) BP: (117-160)/(64-88) 136/71 (01/27 1100) SpO2:  [100 %] 100 % (01/27 1100) FiO2 (%):  [40 %] 40 %  (01/27 0822) Weight:  [93.6 kg] 93.6 kg (01/27 0512)  General - Well nourished, well developed, intubated off sedation.  Ophthalmologic - fundi not visualized due to noncooperation.  Cardiovascular - irregularly irregular heart rate and rhythm.  Neuro - intubated off sedation, eyes closed but able to open on voice but difficulty to maintain opening, not following commands. With forced  eye opening, eyes in left gaze position, not blinking to visual threat to the right, doll's eyes present, but not tracking, PERRL. Corneal reflex present, gag and cough present. Breathing over the vent.  Facial symmetry not able to test due to ET tube.  Tongue midline in mouth. LUE spontaneous movement against gravity, on pain stimulation, LLE 3/5, RLE and RUE mild withdraw. DTR 1+ and positive babinski on the right. Sensation, coordination and gait not tested.   ASSESSMENT/PLAN Larry Rangel is a 68 y.o. male with history of CAD s/p CABG, systolic and diastolic CHF w/ ET 30-35%, AICD, HTN, HLD, AF on xarelto and DM presenting with aphasia and right-sided weakness.   Stroke:  left MCA infarct d/t L M1 occlusion s/p aborted IR d/t hemorrhage at time of device deployment, now w/ cerebral edema, subarachnoid hemorrhage. Infarct  Embolic secondary to known AF in setting of missed Xarelto doses  Code Stroke CT head evolving L insular/MCA territory infarct.     CTA head & neck L M1 occlusion w/o collaterals. L ICA 50%. Occluded VA in the neck. R>L ICA siphon atherosclerosis. Small layering B pleural effusion  Cerebral angio left MCA M1 occlusion with recanalization aborted after extravasation of contrast after Solitaire deployment for severe spasm  CT head 1/23 similar predominately large L extra-axial density c/w contrast extravasation. No definite hmg. limited ability to assess infarct  CT head 1/24 increased hgm L sylvian fissure superimposed on extra-axial contrast extravasation and blood. Now able to see  L insular and FP/MCA infarct. New petechial hmg vs contrast staining  CT head 1/25 Worsening left MCA distribution cytotoxic edema with increased midline shift now measuring 4 mm. Partial clearing of extravasated contrast superimposed on left hemispheric subarachnoid blood.  2D Echo EF 20 to 25% with diffuse hypokinesis.  Left atrium severely dilated.  Right atrium moderately dilated.  LDL 68  HgbA1c 5.1  SCDs for VTE prophylaxis  aspirin 81 mg daily, clopidogrel 75 mg daily and Xarelto (rivaroxaban) daily prior to admission (though wife thinks he missed 1-2 doses xarelto PTA), now on No antithrombotic given hemorrhage   Disposition:  Palliative care involved, appreciate help. Family requested comfort care given poor prognosis   Cerebral Edema Induced Hypernatremia  Was on 3% saline  PICC line requested  CT head showed worsening MLS at 105mm  Off 3% saline given comfort care  Check Na q 6h -> 149->151->155->157  Acute Respiratory Failure  Terminally extubated for comfort care  CCM onboard  Atrial Fibrillation  Home anticoagulation:  Xarelto (rivaroxaban) daily , likely missed 1-2 doses prior to admission  No AC at this time given cerebral hemorrhage  Resumed coreg low dose  Rate under control  CHF with cardiomyopathy  Chronic systolic and diastolic Congestive heart failure w/ baseline EF 30-35%  Status post AICD  EF 20 to 25% this admission  CCM on board  Coreg low-dose  Comfort care measures  Hypertension  Stable . Home meds - coreg, losartan and spironolactone . Resume half dose of coreg now . Off cleviprex  Hyperlipidemia  Home meds:  lipitor 80  Statin on hold given hemorrhage  LDL 68, goal < 70  Diabetes type II  HgbA1c 5.1, at goal < 7.0  Controlled  Dysphagia, secondary to acute stroke   N.p.o.  Off tube feeding for comfort care  Other Stroke Risk Factors  Advanced age  Former Cigarette smoker, quit 7 years ago  Coronary  artery disease s/p CABG  Other Active Problems  Leukocytosis, WBC 9.2-13.3-6.8  Thrombocytopenia, PLT 135->105  CKD stage II creatinine 1.36-1.40-1.40-1.06  Low-grade fever [facility]-max 100.2->afebrile  Hospital day # 4  Marvel PlanJindong Alain Deschene, MD PhD Stroke Neurology 02/08/2018 12:49 PM  To contact Stroke Continuity provider, please refer to [email or web address]. After hours, contact General Neurology

## 2018-02-08 NOTE — Progress Notes (Signed)
Daily Progress Note   Patient Name: Larry Rangel       Date: 02/08/2018 DOB: 07-23-1950  Age: 68 y.o. MRN#: [id number] Attending Physician: Rosalin Hawking, MD Primary Care Physician: Mosie Lukes, MD Admit Date: 01/31/2018  Reason for Consultation/Follow-up: Establishing goals of care  Subjective: Met with patient's spouse and children. Answered their questions related to transition to comfort care and extubation. Plan for extubation today after comfort medications are started. Gave emotional support to family. Family requests prayer prior to extubation. AICD has not been deactivated.  Addendum: Palliative provider remained at bedside as patient was extubated to room air without complication. Now resting comfortably with family at bedside. Continue current infusions.  Review of Systems  Unable to perform ROS: Intubated    Length of Stay: 4  Current Medications: Scheduled Meds:  . chlorhexidine gluconate (MEDLINE KIT)  15 mL Mouth Rinse BID  . Chlorhexidine Gluconate Cloth  6 each Topical Daily  . furosemide  20 mg Intravenous Daily  . mouth rinse  15 mL Mouth Rinse 10 times per day  . mupirocin ointment   Nasal BID  . pantoprazole (PROTONIX) IV  40 mg Intravenous Q24H  . sodium chloride flush  10-40 mL Intracatheter Q12H    Continuous Infusions: . midazolam    . morphine    . propofol (DIPRIVAN) infusion 30 mcg/kg/min (02/08/18 0700)    PRN Meds: acetaminophen **OR** acetaminophen (TYLENOL) oral liquid 160 mg/5 mL **OR** acetaminophen, antiseptic oral rinse, diphenhydrAMINE, glycopyrrolate **OR** glycopyrrolate **OR** glycopyrrolate, haloperidol **OR** haloperidol **OR** haloperidol lactate, midazolam, morphine, ondansetron **OR** ondansetron (ZOFRAN) IV, polyvinyl alcohol,  sodium chloride flush  Physical Exam Vitals signs and nursing note reviewed.  Cardiovascular:     Rate and Rhythm: Normal rate.  Pulmonary:     Comments: Intubated with full support Skin:    General: Skin is warm and dry.  Neurological:     Comments: Does not open eyes to voice, some purposeful movements of R upper extremity             Vital Signs: BP 135/84   Pulse 80   Temp 98.9 F (37.2 C) (Axillary)   Resp 16   Ht _0  (1.778 m)   Wt 93.6 kg   SpO2 100%   BMI 29.61 kg/m  SpO2: SpO2: 100 % O2  Device: O2 Device: Ventilator O2 Flow Rate:    Intake/output summary:   Intake/Output Summary (Last 24 hours) at 02/08/2018 1020 Last data filed at 02/08/2018 0700 Gross per 24 hour  Intake 1199.32 ml  Output 1735 ml  Net -535.68 ml   LBM: Last BM Date: (pta) Baseline Weight: Weight: 89.4 kg Most recent weight: Weight: 93.6 kg       Palliative Assessment/Data: PPS: 10%    Flowsheet Rows     Most Recent Value  Intake Tab  Referral Department  Critical care  Unit at Time of Referral  ICU  Palliative Care Primary Diagnosis  Neurology  Date Notified  02/06/18  Reason for referral  Clarify Goals of Care, End of [place], Psychosocial or Spiritual support  Date of Admission  02/03/2018  Date first seen by Palliative Care  02/07/18  # of days Palliative referral response time  1 Day(s)  # of days IP prior to Palliative referral  2  Clinical Assessment  Palliative Performance Scale Score  20%  Pain Max last 24 hours  Not able to report  Pain Min Last 24 hours  Not able to report  Dyspnea Max Last 24 Hours  Not able to report  Dyspnea Min Last 24 hours  Not able to report  Nausea Max Last 24 Hours  Not able to report  Nausea Min Last 24 Hours  Not able to report  Anxiety Max Last 24 Hours  Not able to report  Anxiety Min Last 24 Hours  Not able to report  Other Max Last 24 Hours  Not able to report  Psychosocial & Spiritual Assessment  Palliative Care  Outcomes  Patient/Family meeting held?  Yes  Who was at the meeting?  wife Vanessa Ralphs  Patient/Family wishes: Interventions discontinued/not started   Hemodialysis, Trach, PEG, Tube feedings/TPN  Palliative Care follow-up planned  Yes, Facility      Patient Active Problem List   Diagnosis Date Noted  . Palliative care by specialist   . Acute respiratory failure with hypoxia ([place])   . Intracranial hemorrhage ([place])   . Acute respiratory insufficiency   . Acute ischemic left MCA stroke ([place]) 01/16/2018  . Middle cerebral artery embolism, left 01/27/2018  . ACS (acute coronary syndrome) ([place]) 08/13/2017  . NSTEMI (non-ST elevated myocardial infarction) ([place])   . Atrial fibrillation, currently in sinus rhythm   . Chronic combined systolic and diastolic heart failure ([place]) 06/01/2017  . S/P CABG (coronary artery bypass graft)   . Longstanding persistent atrial fibrillation   . Memory loss 05/22/2017  . Acute on chronic combined systolic (congestive) and diastolic (congestive) heart failure ([place]) 01/29/2017  . Pulmonary emphysema ([place]) 04/17/2015  . Goals of care, counseling/discussion 12/10/2014  . Hematoma of implantable cardioverter-defibrillator (ICD) pocket 12/16/2013  . Ischemic cardiomyopathy 11/10/2013  . LBBB (left bundle branch block) 08/15/2013  . CAD (coronary artery disease)   . Paroxysmal atrial fibrillation ([place]) 08/02/2013  . Demand ischemia ([place]) 08/02/2013  . CHF (congestive heart failure) ([place]) 07/30/2013  . Medicare welcome exam 06/12/2013  . Microalbuminuria 11/13/2012  . Anxiety state 11/13/2012  . OSA (obstructive sleep apnea) 07/22/2012  . Diabetes mellitus type 2 in obese ([place]) 03/17/2011  . Abnormal liver function 08/23/2010  . Essential hypertension 10/04/2009  . Hyperlipidemia 09/10/2009    Palliative Care Assessment & Plan   Patient Profile: 68 y.o. male  with past medical history of combined congestive heart failure with an EF of 30 to 35%,  AICD, history of MI, coronary artery disease status post CABG 2009, hypertension, hyperlipidemia, atrial fib on anticoagulation, diabetes, admitted on 01/22/2018 with large left MCA stroke.  tPA was not given secondary to anti coagulation on board.  Patient went to IR for thrombectomy but this was not successful. He has since developed subarachnoid hemorrhage, left hemispheric edema with increased mass-effect, now with 4 mm right ward shift.  Repeat echocardiogram shows now further reduction in EF to 20 to 25%  Consulted for goals of care and potential withdrawal of life support.   Assessment/Recommendations/Plan   Large MCA Stroke, subarachnoid hemorrhage with edema, 50m shift, respiratory failure on ventilator- plan to transition to full comfort with one way extubation  Morphine 226mhr continuous infusion with 107m9molus q10 min  Versed continuous infusion .5mg31mth 107mg 20mus  Continue propofol at current rate  Robinul for secretions  Chaplain consult  Extubation with anticipated hospital death  Magnet to AICD until [employer] rep arrives to deactivate device   Goals of Care and Additional Recommendations:  Limitations on Scope of Treatment: Full Comfort Care  Code Status:  DNR  Prognosis:   Hours - Days  Discharge Planning:  Anticipated Hospital Death  Care plan was discussed with patient's spouse and patient's RN- Lauren.  Thank you for allowing the Palliative Medicine Team to assist in the care of this patient.   Time In: 1000 1200 Time Out: 1130 1235 Total Time 155 minutes Prolonged Time Billed yes      Greater than 50%  of this time was spent counseling and coordinating care related to the above assessment and plan.  KasieMariana KaufmanP-C Palliative Medicine   Please contact Palliative Medicine Team phone at [phone] and concerns.

## 2018-02-08 NOTE — Progress Notes (Signed)
OT Cancellation Note  Patient Details Name: Larry Rangel MRN: [id number] DOB: 17-May-1950   Cancelled Treatment:    Reason Eval/Treat Not Completed: Medical issues which prohibited therapy.  Noted Palliative cares note.  Plan is for one way extubation today, and transition to comfort/Hospice.  OT will sign off.  Jeani Hawking, OTR/L Acute Rehabilitation Services Pager [phone] Office [phone]   Jeani Hawking M 02/08/2018, 5:42 AM

## 2018-02-08 NOTE — Progress Notes (Signed)
Patient terminally extubated per MD order. Family and RN at bedside. RT will continue to monitor. 

## 2018-02-08 NOTE — Progress Notes (Signed)
ICD turned off per Lauren and Dr. Roda Shutters.  [facility] [employer] [phone]

## 2018-02-09 DIAGNOSIS — I639 Cerebral infarction, unspecified: Secondary | ICD-10-CM

## 2018-02-09 MED ORDER — GLYCOPYRROLATE 0.2 MG/ML IJ SOLN
0.2000 mg | INTRAMUSCULAR | Status: DC
  Administered 2018-02-09 – 2018-02-10 (×4): 0.2 mg via INTRAVENOUS
  Filled 2018-02-09 (×4): qty 1

## 2018-02-09 MED ORDER — SCOPOLAMINE 1 MG/3DAYS TD PT72
1.0000 | MEDICATED_PATCH | TRANSDERMAL | Status: DC
  Administered 2018-02-09: 1.5 mg via TRANSDERMAL
  Filled 2018-02-09: qty 1

## 2018-02-09 NOTE — Progress Notes (Signed)
                                                                                                                                                                                                         Daily Progress Note   Patient Name: Larry Rangel       Date: 02/09/2018 DOB: 11/05/1950  Age: 67 y.o. MRN#: [id number] Attending Physician: Xu, Jindong, MD Primary Care Physician: Blyth, Stacey A, MD Admit Date: 01/14/2018  Reason for Consultation/Follow-up: Establishing goals of care  Subjective: Patient in bed, does not respond to voice or touch. RR increased, audible terminal secretions. No morphine boluses given. Directed RN to give robinul and and morphine bolus. Noted fever overnight and this morning. Patient has nasal cannula in place with O2 at 2L without an order for this in place.  Review of Systems  Unable to perform ROS: Patient unresponsive    Length of Stay: 5  Current Medications: Scheduled Meds:  . chlorhexidine gluconate (MEDLINE KIT)  15 mL Mouth Rinse BID  . Chlorhexidine Gluconate Cloth  6 each Topical Daily  . glycopyrrolate  0.2 mg Intravenous Q4H  . mouth rinse  15 mL Mouth Rinse 10 times per day  . pantoprazole (PROTONIX) IV  40 mg Intravenous Q24H  . scopolamine  1 patch Transdermal Q72H  . sodium chloride flush  10-40 mL Intracatheter Q12H    Continuous Infusions: . midazolam 0.5 mg/hr (02/09/18 0312)  . morphine 2 mg/hr (02/09/18 0000)    PRN Meds: acetaminophen **OR** acetaminophen (TYLENOL) oral liquid 160 mg/5 mL **OR** acetaminophen, antiseptic oral rinse, diphenhydrAMINE, haloperidol **OR** haloperidol **OR** haloperidol lactate, midazolam, morphine, ondansetron **OR** ondansetron (ZOFRAN) IV, polyvinyl alcohol, sodium chloride flush  Physical Exam Vitals signs and nursing note reviewed.  Constitutional:      Appearance: He is ill-appearing.  Cardiovascular:     Rate and Rhythm: Tachycardia present.  Pulmonary:     Comments: Increased  effort, terminal secretions            Vital Signs: BP 119/82 (BP Location: Right Arm)   Pulse (!) 110   Temp (!) 102.1 F (38.9 C) (Axillary)   Resp (!) 21   Ht 5' 10" (1.778 m)   Wt 93.6 kg   SpO2 (!) 88%   BMI 29.61 kg/m  SpO2: SpO2: (!) 88 % O2 Device: O2 Device: Room Air O2 Flow Rate:    Intake/output summary:   Intake/Output Summary (Last 24 hours) at 02/09/2018 1011 Last data filed at 02/09/2018 0600 Gross per 24 hour  Intake 136.64 ml    Output 1200 ml  Net -1063.36 ml   LBM: Last BM Date: (pta) Baseline Weight: Weight: 89.4 kg Most recent weight: Weight: 93.6 kg       Palliative Assessment/Data: PPS: 10%   Flowsheet Rows     Most Recent Value  Intake Tab  Referral Department  Critical care  Unit at Time of Referral  ICU  Palliative Care Primary Diagnosis  Neurology  Date Notified  02/06/18  Reason for referral  Clarify Goals of Care, End of Life Care Assistance, Psychosocial or Spiritual support  Date of Admission  01/30/2018  Date first seen by Palliative Care  02/07/18  # of days Palliative referral response time  1 Day(s)  # of days IP prior to Palliative referral  2  Clinical Assessment  Palliative Performance Scale Score  20%  Pain Max last 24 hours  Not able to report  Pain Min Last 24 hours  Not able to report  Dyspnea Max Last 24 Hours  Not able to report  Dyspnea Min Last 24 hours  Not able to report  Nausea Max Last 24 Hours  Not able to report  Nausea Min Last 24 Hours  Not able to report  Anxiety Max Last 24 Hours  Not able to report  Anxiety Min Last 24 Hours  Not able to report  Other Max Last 24 Hours  Not able to report  Psychosocial & Spiritual Assessment  Palliative Care Outcomes  Patient/Family meeting held?  Yes  Who was at the meeting?  wife Maria, dtr Angela  Patient/Family wishes: Interventions discontinued/not started   Hemodialysis, Trach, PEG, Tube feedings/TPN  Palliative Care follow-up planned  Yes, Facility       Patient Active Problem List   Diagnosis Date Noted  . Terminal care   . Advanced care planning/counseling discussion   . Palliative care by specialist   . Acute respiratory failure with hypoxia (HCC)   . Intracranial hemorrhage (HCC)   . Acute respiratory insufficiency   . Acute ischemic left MCA stroke (HCC) 01/18/2018  . Middle cerebral artery embolism, left 02/01/2018  . ACS (acute coronary syndrome) (HCC) 08/13/2017  . NSTEMI (non-ST elevated myocardial infarction) (HCC)   . Atrial fibrillation, currently in sinus rhythm   . Chronic combined systolic and diastolic heart failure (HCC) 06/01/2017  . S/P CABG (coronary artery bypass graft)   . Longstanding persistent atrial fibrillation   . Memory loss 05/22/2017  . Acute on chronic combined systolic (congestive) and diastolic (congestive) heart failure (HCC) 01/29/2017  . Pulmonary emphysema (HCC) 04/17/2015  . Goals of care, counseling/discussion 12/10/2014  . Hematoma of implantable cardioverter-defibrillator (ICD) pocket 12/16/2013  . Ischemic cardiomyopathy 11/10/2013  . LBBB (left bundle branch block) 08/15/2013  . CAD (coronary artery disease)   . Paroxysmal atrial fibrillation (HCC) 08/02/2013  . Demand ischemia (HCC) 08/02/2013  . CHF (congestive heart failure) (HCC) 07/30/2013  . Medicare welcome exam 06/12/2013  . Microalbuminuria 11/13/2012  . Anxiety state 11/13/2012  . OSA (obstructive sleep apnea) 07/22/2012  . Diabetes mellitus type 2 in obese (HCC) 03/17/2011  . Abnormal liver function 08/23/2010  . Essential hypertension 10/04/2009  . Hyperlipidemia 09/10/2009    Palliative Care Assessment & Plan   Patient Profile: 67 y.o.malewith past medical history of combined congestive heart failure with an EF of 30 to 35%, AICD, history of MI, coronary artery disease status post CABG 2009, hypertension, hyperlipidemia, atrial fib on anticoagulation, diabetes,admitted on 01/22/2020with large left MCA stroke.  tPA was not given secondary   to anticoagulation on board. Patient went to IR for thrombectomy but this was not successful. He has sincedevelopedsubarachnoid hemorrhage, left hemispheric edema with increased mass-effect, now with 4 mm right ward shift.Repeat echocardiogram shows now further reduction in EF to 20 to 25%  Consulted for goals of care and potential withdrawal of life support.   Assessment/Recommendations/Plan   Patient is actively dying  Increased respiratory effort and discomfort  Increase morphine to 86m/hr  Increased terminal secretions:   Add scopolamine patch  Schedule robinul .[id number]   Not stable for transfer  Continue full comfort measures only  Remove nasal cannula- use morphine and versed for signs of discomfort related to air hunger at end of life- do not check O2 saturation, do not replace nasal cannula  Goals of Care and Additional Recommendations:  Limitations on Scope of Treatment: Full Comfort Care  Code Status:  DNR  Prognosis:   Hours - Days  Discharge Planning:  Anticipated Hospital Death  Care plan was discussed with patient's son.  Thank you for allowing the Palliative Medicine Team to assist in the care of this patient.   Time In: 0945 Time Out: 1020 Total Time 35 minutes Prolonged Time Billed no      Greater than 50%  of this time was spent counseling and coordinating care related to the above assessment and plan.  KaMariana KaufmanAGNP-C Palliative Medicine   Please contact Palliative Medicine Team phone at [phone] questions and concerns.

## 2018-02-09 NOTE — Progress Notes (Signed)
STROKE TEAM PROGRESS NOTE   INTERVAL HISTORY His daughter is at the bedside. Pt in comfort care now, palliative care following, increased morphine drip due to labored breath.   Vitals:   02/08/18 2300 02/09/18 0000 02/09/18 0125 02/09/18 0806  BP:   (!) 155/104 119/82  Pulse: 89 96 (!) 101 (!) 110  Resp: 16  16 (!) 21  Temp:   (!) 100.7 F (38.2 C) (!) 102.1 F (38.9 C)  TempSrc:   Oral Axillary  SpO2: 95% (!) 83% (!) 89% (!) 88%  Weight:      Height:        CBC:  Recent Labs  Lab February 14, 2018 2045 02/05/18 0452 02/07/18 0439 02/08/18 0508  WBC 9.2 13.3* 7.0 6.8  NEUTROABS 7.0 12.4*  --   --   HGB 14.4 13.2 12.0* 12.2*  HCT 48.8 40.8 40.0 40.7  MCV 107.7* 102.5* 109.3* 110.6*  PLT 138* 135* 94* 105*    Basic Metabolic Panel:  Recent Labs  Lab 02/06/18 0408  02/06/18 1745  02/07/18 0439  02/07/18 2203 02/08/18 0508  NA 149*   < > 156*   < > 154*   < > 157* 157*  K  --   --   --   --  3.9  --   --  3.6  CL  --   --   --   --  128*  --   --  130*  CO2  --   --   --   --  19*  --   --  24  GLUCOSE  --   --   --   --  131*  --   --  124*  BUN  --   --   --   --  27*  --   --  34*  CREATININE  --   --   --   --  0.97  --   --  1.06  CALCIUM  --   --   --   --  8.7*  --   --  9.0  MG 2.3  --  2.5*  --   --   --   --  2.4  PHOS 2.5  --  3.3  --   --   --   --   --    < > = values in this interval not displayed.   Lipid Panel:     Component Value Date/Time   CHOL 130 02/05/2018 0452   TRIG 148 02/05/2018 0452   TRIG 149 02/05/2018 0452   HDL 32 (L) 02/05/2018 0452   CHOLHDL 4.1 02/05/2018 0452   VLDL 30 02/05/2018 0452   LDLCALC 68 02/05/2018 0452   HgbA1c:  Lab Results  Component Value Date   HGBA1C 5.1 02/05/2018   Urine Drug Screen:     Component Value Date/Time   LABOPIA NONE DETECTED 01/29/2017 2327   COCAINSCRNUR NONE DETECTED 01/29/2017 2327   COCAINSCRNUR NEG 11/12/2012 1648   LABBENZ NONE DETECTED 01/29/2017 2327   LABBENZ NEG 11/12/2012 1648    AMPHETMU NONE DETECTED 01/29/2017 2327   THCU NONE DETECTED 01/29/2017 2327   LABBARB NONE DETECTED 01/29/2017 2327    Alcohol Level     Component Value Date/Time   ETH <10 08/23/2010 1551    IMAGING  Ct Angio Head W Or Wo Contrast Ct Angio Neck W Or Wo Contrast Ct Head Code Stroke Wo Contrast 2018-02-14 IMPRESSION:   CT HEAD  1. Evolving  hypodensity involving the left insula and overlying supra ganglionic left cerebral hemisphere, evolving acute left MCA territory infarct. No intracranial hemorrhage.  2. Aspects = 7  3. Underlying age-related cerebral atrophy with mild chronic small vessel ischemic disease.   CTA HEAD AND NECK 1. Positive study for emergent large vessel occlusion, with occlusion of the distal left M1 segment. Little to no collateral flow seen distally within the left MCA distribution.  2. Approximate 50% atheromatous stenosis at the proximal left ICA.  3. Occluded vertebral artery within the neck, with irregular attenuated distal reconstitution just prior to the skull base.  4. Parotid carotid siphon atherosclerosis with associated moderate diffuse narrowing, right worse than left.  5. Small layering bilateral pleural effusions, partially visualized.    Ct Head Wo Contrast 02/06/2018 IMPRESSION:  1. Worsening left MCA distribution cytotoxic edema with increased midline shift now measuring 4 mm.  2. Partial clearing of extravasated contrast superimposed on left hemispheric subarachnoid blood. No new site of hemorrhage.    Ct Head Wo Contrast 02/05/2018 IMPRESSION:  1. Increased hemorrhage LEFT sylvian fissure density superimposed on extra-axial contrast extravasation and blood.  2. Increased conspicuity of LEFT insular and frontoparietal/MCA infarct. New minimal petechial hemorrhage versus contrast staining.    Ct Head Wo Contrast 02/17/2018 IMPRESSION:  1. Similar predominately LEFT extra-axial density compatible with contrast extravasation and  hemorrhage with minimal extra-axial pneumocephalus.  2. No definite intraparenchymal hemorrhage. Limited assessment for acute infarct.     Dg Chest Port 1 View 02/05/2018 IMPRESSION:  1. Endotracheal tube tip at the thoracic inlet approximately 6.6 cm above the carina distal repositioning should be considered. NG tube tip below left hemidiaphragm.  2. AICD noted stable position with lead over the midline. Prior CABG. Stable cardiomegaly. Mild interstitial prominence noted. Mild CHF can not be excluded.    Cerebral angiogram 02-17-18 Lt MCA M1 occlusion with early recanalization.procedure aborted after extravasation of contrast noted following deployment of  A 66mm x 40 mm solitaire device associated with severe spasm. NO changes noted in BP or HR. Device recaptured into microcath and retrieved . immediate CT brain demonstrates contrast in the Lt perisylvian fissure and Lt temporal cerebral convexity. No mass effect or midline shift noted . No intraparenchymal  hemorrhage noted.  2D echocardiogram 2018/02/17 - Left ventricle: The cavity size was moderately dilated. Wall thickness was increased in a pattern of mild LVH. Systolic function was severely reduced. The estimated ejection fraction was in the range of 20% to 25%. Diffuse hypokinesis. - Mitral valve: Calcified annulus. There was mild regurgitation. - Left atrium: The atrium was severely dilated. - Right ventricle: The cavity size was mildly dilated. Systolic function was moderately reduced. - Right atrium: The atrium was moderately dilated. Impressions:  Definity used; severe global reduction in LV systolic function; mild LVH; 4 chamber enlargement; mild MR; moderate RV dysfunction.   PHYSICAL EXAM  Temp:  [100.7 F (38.2 C)-102.1 F (38.9 C)] 102.1 F (38.9 C) (01/28 0806) Pulse Rate:  [73-110] 110 (01/28 0806) Resp:  [7-21] 21 (01/28 0806) BP: (119-155)/(82-104) 119/82 (01/28 0806) SpO2:  [83 %-99 %] 88 % (01/28  0806)  General - Well nourished, well developed, mildly labored breath.  Ophthalmologic - fundi not visualized due to noncooperation.  Cardiovascular - irregularly irregular heart rate and rhythm.  Neuro - limited exam due to comfort care measures. His eyes closed, not responsive to voice stimulation. Right facial droop. No spontaneous movement in all extremities. Mildly labored breath with pharyngeal secretions.  ASSESSMENT/PLAN Larry Rangel is a 68 y.o. male with history of CAD s/p CABG, systolic and diastolic CHF w/ ET 30-35%, AICD, HTN, HLD, AF on xarelto and DM presenting with aphasia and right-sided weakness.   Stroke:  left MCA infarct d/t L M1 occlusion s/p aborted IR d/t hemorrhage at time of device deployment, now w/ cerebral edema, subarachnoid hemorrhage. Infarct  Embolic secondary to known AF in setting of missed Xarelto doses  Code Stroke CT head evolving L insular/MCA territory infarct.     CTA head & neck L M1 occlusion w/o collaterals. L ICA 50%. Occluded VA in the neck. R>L ICA siphon atherosclerosis. Small layering B pleural effusion  Cerebral angio left MCA M1 occlusion with recanalization aborted after extravasation of contrast after Solitaire deployment for severe spasm  CT head 1/23 similar predominately large L extra-axial density c/w contrast extravasation. No definite hmg. limited ability to assess infarct  CT head 1/24 increased hgm L sylvian fissure superimposed on extra-axial contrast extravasation and blood. Now able to see L insular and FP/MCA infarct. New petechial hmg vs contrast staining  CT head 1/25 Worsening left MCA distribution cytotoxic edema with increased midline shift now measuring 4 mm. Partial clearing of extravasated contrast superimposed on left hemispheric subarachnoid blood.  2D Echo EF 20 to 25% with diffuse hypokinesis.  Left atrium severely dilated.  Right atrium moderately dilated.  LDL 68  HgbA1c 5.1  SCDs for VTE  prophylaxis  aspirin 81 mg daily, clopidogrel 75 mg daily and Xarelto (rivaroxaban) daily prior to admission (though wife thinks he missed 1-2 doses xarelto PTA), now on No antithrombotic given hemorrhage   Disposition:  Full comfort care now   Cerebral Edema Induced Hypernatremia  Was on 3% saline  PICC line requested  CT head showed worsening MLS at 45mm  Off 3% saline given comfort care  Check Na q 6h -> 149->151->155->157  Acute Respiratory Failure  Terminally extubated for comfort care  CCM onboard  Atrial Fibrillation  Home anticoagulation:  Xarelto (rivaroxaban) daily , likely missed 1-2 doses prior to admission  No AC at this time given cerebral hemorrhage  Resumed coreg low dose  Rate under control  CHF with cardiomyopathy  Chronic systolic and diastolic Congestive heart failure w/ baseline EF 30-35%  Status post AICD  EF 20 to 25% this admission  CCM on board  Coreg low-dose  Comfort care measures  Hypertension  Stable . Home meds - coreg, losartan and spironolactone . Resume half dose of coreg now . Off cleviprex  Hyperlipidemia  Home meds:  lipitor 80  Statin on hold given hemorrhage  LDL 68, goal < 70  Diabetes type II  HgbA1c 5.1, at goal < 7.0  Controlled  Dysphagia, secondary to acute stroke   N.p.o.  Off tube feeding for comfort care  Other Stroke Risk Factors  Advanced age  Former Cigarette smoker, quit 7 years ago  Coronary artery disease s/p CABG  Other Active Problems  Leukocytosis, WBC 9.2-13.3-6.8  Thrombocytopenia, PLT 135->105  CKD stage II creatinine 1.36-1.40-1.40-1.06  Low-grade fever T-max 100.2->afebrile  Hospital day # 5  Marvel Plan, MD PhD Stroke Neurology 02/09/2018 3:25 PM  To contact Stroke Continuity provider, please refer to [email or web address]. After hours, contact General Neurology

## 2018-02-10 ENCOUNTER — Encounter (HOSPITAL_COMMUNITY): Payer: Self-pay | Admitting: Interventional Radiology

## 2018-02-13 NOTE — Death Summary Note (Signed)
DEATH SUMMARY   Patient Details  Name: Larry Rangel MRN: [id number] DOB: 1950-09-08  Admission/Discharge Information   Admit Date:  02-25-18  Date of Death: Date of Death: 03-Mar-2018  Time of Death: Time of Death: 0250  Length of Stay: 6  Referring Physician: Bradd Canary, MD   Reason(s) for Hospitalization  stroke  Diagnoses  Preliminary cause of death:   left MCA infarct d/t L M1 occlusion s/p aborted IR d/t hemorrhage at time of device deployment, associated with cerebral edema and subarachnoid hemorrhage.   Secondary Diagnoses (including complications and co-morbidities):    Intracranial hemorrhage (HCC)   Cerebral edema   afib   CHF with cardiomyopathy   HTN   HLD   DM   Dysphagia   Leukocytosis   Thrombocytopenia   CKD stage II   fever   Palliative care by specialist   Acute respiratory failure with hypoxia [facility])   Brief Hospital Course (including significant findings, care, treatment, and services provided and events leading to death)   Larry Rangel is a 68 y.o. male with history of CAD s/p CABG, systolic and diastolic CHF w/ ET 30-35%, AICD, HTN, HLD, AF on xarelto and DM presenting with aphasia and right-sided weakness.   Stroke:  left MCA infarct d/t L M1 occlusion s/p aborted IR d/t hemorrhage at time of device deployment, now w/ cerebral edema, subarachnoid hemorrhage. Infarct  Embolic secondary to known AF in setting of missed Xarelto doses  Code Stroke CT head evolving L insular/MCA territory infarct.     CTA head & neck L M1 occlusion w/o collaterals. L ICA 50%. Occluded VA in the neck. R>L ICA siphon atherosclerosis. Small layering B pleural effusion  Cerebral angio left MCA M1 occlusion with recanalization aborted after extravasation of contrast after [employer] deployment for severe spasm  CT head 02/25/22 similar predominately large L extra-axial density c/w contrast extravasation. No definite hmg. limited ability to assess  infarct  CT head 1/24 increased hgm L sylvian fissure superimposed on extra-axial contrast extravasation and blood. Now able to see L insular and FP/MCA infarct. New petechial hmg vs contrast staining  CT head 1/25 Worsening left MCA distribution cytotoxic edema with increased midline shift now measuring 4 mm. Partial clearing of extravasated contrast superimposed on left hemispheric subarachnoid blood.  2D Echo EF 20 to 25% with diffuse hypokinesis.  Left atrium severely dilated.  Right atrium moderately dilated.  LDL 68  HgbA1c 5.1  aspirin 81 mg daily, clopidogrel 75 mg daily and Xarelto (rivaroxaban) daily prior to admission (though wife thinks he missed 1-2 doses xarelto PTA), now on No antithrombotic given hemorrhage and comfort care  Disposition:  Full comfort care, pt passed away on 2018/03/03 0250  Cerebral Edema Induced Hypernatremia  Was on 3% saline  PICC line requested  CT head showed worsening MLS at 4mm  Off 3% saline given comfort care  Na 149->151->155->157  Acute Respiratory Failure  Terminally extubated for comfort care  CCM onboard  Atrial Fibrillation  Home anticoagulation:  Xarelto (rivaroxaban) daily , likely missed 1-2 doses prior to admission  No AC at this time given cerebral hemorrhage  Rate under control  CHF with cardiomyopathy  Chronic systolic and diastolic Congestive heart failure w/ baseline EF 30-35%  Status post AICD  EF 20 to 25% this admission  CCM on board  Comfort care measures  Hypertension  Stable  Home meds - coreg, losartan and spironolactone  Off cleviprex  Hyperlipidemia  Home meds:  lipitor 80  Statin on hold given hemorrhage  LDL 68, goal < 70  Diabetes type II  HgbA1c 5.1, at goal < 7.0  Controlled  Dysphagia, secondary to acute stroke   N.p.o.  Off tube feeding for comfort care  Other Stroke Risk Factors  Advanced age  Former Cigarette smoker, quit 7 years ago  Coronary  artery disease s/p CABG  Other Active Problems  Leukocytosis, WBC 9.2-13.3-6.8  Thrombocytopenia, PLT 135->105  CKD stage II creatinine 1.36-1.40-1.40-1.06  Low-grade fever T-max 100.2->afebrile   Pertinent Labs and Studies  Significant Diagnostic Studies Ct Angio Head W Or Wo Contrast  Result Date: 02/18/18 CLINICAL DATA:  Initial evaluation for acute right-sided facial droop. EXAM: CT ANGIOGRAPHY HEAD AND NECK TECHNIQUE: Multidetector CT imaging of the head and neck was performed using the standard protocol during bolus administration of intravenous contrast. Multiplanar CT image reconstructions and MIPs were obtained to evaluate the vascular anatomy. Carotid stenosis measurements (when applicable) are obtained utilizing NASCET criteria, using the distal internal carotid diameter as the denominator. Multiphase CT imaging of the brain was performed following IV bolus contrast injection. Subsequent parametric perfusion maps were calculated using RAPID software. CONTRAST:  ISOVUE-370 IOPAMIDOL (ISOVUE-370) INJECTION 76% COMPARISON:  Prior CT from 08/13/2017 FINDINGS: CT HEAD FINDINGS Brain: Subtle evolving hypodensity within the left insula, adjacent left temporal lobe, and overlying supra ganglionic cortical gray matter, consistent with acute ischemic left MCA territory infarct. Sparing of the basal ganglia and internal capsule at this time. No acute intracranial hemorrhage. No mass lesion, midline shift or mass effect. No hydrocephalus. No extra-axial fluid collection. Atrophy with chronic microvascular disease noted. Vascular: Asymmetric hyperdensity within the distal left M1 segment, suspicious for possible large vessel occlusion. Calcified atherosclerosis at the skull base. Skull: Scalp soft tissues within normal limits.  Calvarium intact. Sinuses/Orbits: Globes and orbital soft tissues within normal limits. Right frontal sinus retention cyst. Paranasal sinuses are otherwise largely  clear. No mastoid effusion. Other: 5 ASPECTS (Alberta Stroke Program Early CT Score) - Ganglionic level infarction (caudate, lentiform nuclei, internal capsule, insula, M1-M3 cortex): 5 - Supraganglionic infarction (M4-M6 cortex): 2 Total score (0-10 with 10 being normal): 7 Review of the MIP images confirms the above findings CTA NECK FINDINGS Aortic arch: Visualized aortic arch of normal caliber with normal branch pattern. Moderate atherosclerotic change about the arch and origin of the great vessels without hemodynamically significant stenosis. Visualized subclavian arteries patent. Right carotid system: Right common carotid artery patent from its origin to the bifurcation without stenosis. Scattered calcified plaque about the right bifurcation/proximal right ICA without hemodynamically significant stenosis. Right ICA widely patent distally to the skull base without stenosis, dissection, or occlusion. Left carotid system: Left common carotid artery patent from its origin to the bifurcation without hemodynamically significant stenosis. Scattered mixed plaque about the left bifurcation/proximal left ICA with resultant stenosis of up to 50% by NASCET criteria. Left ICA patent distally without additional stenosis, dissection, or occlusion. Vertebral arteries: Both of the vertebral arteries arise from the subclavian arteries. Focal plaque at the origin of the left vertebral artery with approximate mild-to-moderate stenosis (estimated 30-50%). Additional scattered plaque within the proximal left V2 segment with moderate stenosis (series 7, image 254). Left vertebral artery otherwise widely patent to the skull base. Right vertebral artery largely occluded within the neck. Scant distal reconstitution at the right V2/V3 segment via collateralization. Irregular attenuated flow seen within the right vert as it courses into the cranial vault. Skeleton: No acute osseous abnormality. No discrete  lytic or blastic osseous  lesions. Bulky anterior osteophytic spurring noted throughout the cervical spine. Other neck: No other acute soft tissue abnormality within the neck. Upper chest: Layering secretions noted within the subglottic trachea just above the carina. Small layering bilateral pleural effusions partially visualized. Associated scattered atelectatic changes noted within the right lung. Review of the MIP images confirms the above findings CTA HEAD FINDINGS Anterior circulation: Petrous segments widely patent bilaterally. Heavy calcified plaque within the cavernous/supraclinoid ICAs bilaterally with up to moderate approximate 50% stenosis, right worse than left. ICA termini perfused. Left A1 patent. Right A1 hypoplastic and/or absent. Normal anterior communicating artery. Anterior cerebral arteries patent to their distal aspects without flow-limiting stenosis. Left M1 patent proximally. Abrupt occlusion of the distal left M1 near the left MCA bifurcation, acute in appearance. Little to no collateral flow seen distally within the left MCA distribution. Right M1 patent to its distal aspect. Normal right MCA bifurcation. Distal right MCA branches well perfused. Posterior circulation: Multifocal scattered plaque within the dominant left multifocal stenosis. Patent left PICA. Scant attenuated flow within the right V4 segment as it courses into the cranial vault. Superimposed atheromatous plaque with moderate to severe stenosis within the right V4 segment prior to the takeoff of the right PICA. Right V4 is patent to the vertebrobasilar junction. Right PICA is perfused proximally. Basilar patent to its distal aspect without stenosis. Superior cerebral arteries patent proximally. Left PCA supplied via the basilar. Predominant fetal type origin of the right PCA supplied via a patent right posterior communicating artery. PCAs patent to their distal aspects without hemodynamically significant stenosis. V4 segment with resultant mild Venous  sinuses: Grossly patent, although not well evaluated due to arterial timing of the contrast bolus. Anatomic variants: Fetal type right PCA. Delayed phase: Not performed. Review of the MIP images confirms the above findings IMPRESSION: CT HEAD IMPRESSION 1. Evolving hypodensity involving the left insula and overlying supra ganglionic left cerebral hemisphere, evolving acute left MCA territory infarct. No intracranial hemorrhage. 2. Aspects = 7. 3. Underlying age-related cerebral atrophy with mild chronic small vessel ischemic disease. CTA HEAD AND NECK IMPRESSION 1. Positive study for emergent large vessel occlusion, with occlusion of the distal left M1 segment. Little to no collateral flow seen distally within the left MCA distribution. 2. Approximate 50% atheromatous stenosis at the proximal left ICA. 3. Occluded vertebral artery within the neck, with irregular attenuated distal reconstitution just prior to the skull base. 4. Parotid carotid siphon atherosclerosis with associated moderate diffuse narrowing, right worse than left. 5. Small layering bilateral pleural effusions, partially visualized. These results were communicated to Dr. Laurence Slate At 9:15 pmon 01/25/2020by text page via the [facility] messaging system. Electronically Signed   By: Rise Mu M.D.   On: 01/26/2018 21:53   Ct Head Wo Contrast  Result Date: 02/06/2018 CLINICAL DATA:  Stroke follow-up EXAM: CT HEAD WITHOUT CONTRAST TECHNIQUE: Contiguous axial images were obtained from the base of the skull through the vertex without intravenous contrast. COMPARISON:  Head CT 02/05/2018 FINDINGS: Brain: Partial clearing of contrast extravasation. Continued evolution of cytotoxic edema pattern within the left hemisphere with increased mass effect on the left lateral ventricle. There is now 4 mm of rightward midline shift. No intraparenchymal hemorrhage. Vascular: No abnormal hyperdensity of the major intracranial arteries or dural venous sinuses. No  intracranial atherosclerosis. Skull: The visualized skull base, calvarium and extracranial soft tissues are normal. Sinuses/Orbits: No fluid levels or advanced mucosal thickening of the visualized paranasal sinuses. No mastoid or  middle ear effusion. The orbits are normal. IMPRESSION: 1. Worsening left MCA distribution cytotoxic edema with increased midline shift now measuring 4 mm. 2. Partial clearing of extravasated contrast superimposed on left hemispheric subarachnoid blood. No new site of hemorrhage. Electronically Signed   By: Deatra Robinson M.D.   On: 02/06/2018 03:22   Ct Head Wo Contrast  Result Date: 02/05/2018 CLINICAL DATA:  Follow up intracranial hemorrhage after intervention. EXAM: CT HEAD WITHOUT CONTRAST TECHNIQUE: Contiguous axial images were obtained from the base of the skull through the vertex without intravenous contrast. COMPARISON:  CT HEAD 2018/02/06 at 2322 hours. FINDINGS: BRAIN: Increased density LEFT sylvian fissure (previously 5 mm in craniocaudad dimension, now 10 mm) with otherwise similar LEFT extra-axial and inter pedicular cistern densities. Near resolution of LEFT extra-axial pneumocephalus. LEFT frontal lobe (series 3, image 24) more conspicuous LEFT insular ribbon sign and blurring of LEFT frontoparietal gray-white matter junction. VASCULAR: Moderate to severe calcific atherosclerosis carotid bifurcations. SKULL/SOFT TISSUES: No skull fracture. No significant soft tissue swelling. ORBITS/SINUSES: The included ocular globes and orbital contents are normal.Mild paranasal sinus mucosal thickening. Mastoid air cells are well aerated. OTHER: Life support lines in place. IMPRESSION: 1. Increased hemorrhage LEFT sylvian fissure density superimposed on extra-axial contrast extravasation and blood. 2. Increased conspicuity of LEFT insular and frontoparietal/MCA infarct. New minimal petechial hemorrhage versus contrast staining. Electronically Signed   By: Awilda Metro M.D.    On: 02/05/2018 03:42   Ct Head Wo Contrast  Result Date: Feb 06, 2018 CLINICAL DATA:  Hemorrhage after thrombectomy. EXAM: CT HEAD WITHOUT CONTRAST TECHNIQUE: Contiguous axial images were obtained from the base of the skull through the vertex without intravenous contrast. COMPARISON:  CT HEAD 02/06/2018 at 2051 hours and CT HEAD 2018-02-06 at 2245 hours. FINDINGS: BRAIN: LEFT extra-axial varying density within sylvian fissure, prepontine cistern extending to LEFT frontal parietal sulci. Minimal LEFT extra-axial pneumocephalus. No definite intraparenchymal hemorrhage. Limited assessment for acute infarct due to presence of extra-axial density. Old RIGHT caudate head lacunar infarct. No hydrocephalus. Basal cisterns are patent. VASCULAR: Moderate to severe calcific atherosclerosis of the carotid siphons. SKULL: No skull fracture. No significant scalp soft tissue swelling. SINUSES/ORBITS: Mild paranasal sinus mucosal thickening. Mastoid air cells are well aerated.The included ocular globes and orbital contents are non-suspicious. OTHER: Life support lines in place. IMPRESSION: 1. Similar predominately LEFT extra-axial density compatible with contrast extravasation and hemorrhage with minimal extra-axial pneumocephalus. 2. No definite intraparenchymal hemorrhage. Limited assessment for acute infarct. Electronically Signed   By: Awilda Metro M.D.   On: 06-Feb-2018 23:37   Ct Angio Neck W Or Wo Contrast  Result Date: 02-06-18 CLINICAL DATA:  Initial evaluation for acute right-sided facial droop. EXAM: CT ANGIOGRAPHY HEAD AND NECK TECHNIQUE: Multidetector CT imaging of the head and neck was performed using the standard protocol during bolus administration of intravenous contrast. Multiplanar CT image reconstructions and MIPs were obtained to evaluate the vascular anatomy. Carotid stenosis measurements (when applicable) are obtained utilizing NASCET criteria, using the distal internal carotid  diameter as the denominator. Multiphase CT imaging of the brain was performed following IV bolus contrast injection. Subsequent parametric perfusion maps were calculated using RAPID software. CONTRAST:  ISOVUE-370 IOPAMIDOL (ISOVUE-370) INJECTION 76% COMPARISON:  Prior CT from 08/13/2017 FINDINGS: CT HEAD FINDINGS Brain: Subtle evolving hypodensity within the left insula, adjacent left temporal lobe, and overlying supra ganglionic cortical gray matter, consistent with acute ischemic left MCA territory infarct. Sparing of the basal ganglia and internal capsule  at this time. No acute intracranial hemorrhage. No mass lesion, midline shift or mass effect. No hydrocephalus. No extra-axial fluid collection. Atrophy with chronic microvascular disease noted. Vascular: Asymmetric hyperdensity within the distal left M1 segment, suspicious for possible large vessel occlusion. Calcified atherosclerosis at the skull base. Skull: Scalp soft tissues within normal limits.  Calvarium intact. Sinuses/Orbits: Globes and orbital soft tissues within normal limits. Right frontal sinus retention cyst. Paranasal sinuses are otherwise largely clear. No mastoid effusion. Other: 5 ASPECTS (Alberta Stroke Program Early CT Score) - Ganglionic level infarction (caudate, lentiform nuclei, internal capsule, insula, M1-M3 cortex): 5 - Supraganglionic infarction (M4-M6 cortex): 2 Total score (0-10 with 10 being normal): 7 Review of the MIP images confirms the above findings CTA NECK FINDINGS Aortic arch: Visualized aortic arch of normal caliber with normal branch pattern. Moderate atherosclerotic change about the arch and origin of the great vessels without hemodynamically significant stenosis. Visualized subclavian arteries patent. Right carotid system: Right common carotid artery patent from its origin to the bifurcation without stenosis. Scattered calcified plaque about the right bifurcation/proximal right ICA without hemodynamically  significant stenosis. Right ICA widely patent distally to the skull base without stenosis, dissection, or occlusion. Left carotid system: Left common carotid artery patent from its origin to the bifurcation without hemodynamically significant stenosis. Scattered mixed plaque about the left bifurcation/proximal left ICA with resultant stenosis of up to 50% by NASCET criteria. Left ICA patent distally without additional stenosis, dissection, or occlusion. Vertebral arteries: Both of the vertebral arteries arise from the subclavian arteries. Focal plaque at the origin of the left vertebral artery with approximate mild-to-moderate stenosis (estimated 30-50%). Additional scattered plaque within the proximal left V2 segment with moderate stenosis (series 7, image 254). Left vertebral artery otherwise widely patent to the skull base. Right vertebral artery largely occluded within the neck. Scant distal reconstitution at the right V2/V3 segment via collateralization. Irregular attenuated flow seen within the right vert as it courses into the cranial vault. Skeleton: No acute osseous abnormality. No discrete lytic or blastic osseous lesions. Bulky anterior osteophytic spurring noted throughout the cervical spine. Other neck: No other acute soft tissue abnormality within the neck. Upper chest: Layering secretions noted within the subglottic trachea just above the carina. Small layering bilateral pleural effusions partially visualized. Associated scattered atelectatic changes noted within the right lung. Review of the MIP images confirms the above findings CTA HEAD FINDINGS Anterior circulation: Petrous segments widely patent bilaterally. Heavy calcified plaque within the cavernous/supraclinoid ICAs bilaterally with up to moderate approximate 50% stenosis, right worse than left. ICA termini perfused. Left A1 patent. Right A1 hypoplastic and/or absent. Normal anterior communicating artery. Anterior cerebral arteries patent to  their distal aspects without flow-limiting stenosis. Left M1 patent proximally. Abrupt occlusion of the distal left M1 near the left MCA bifurcation, acute in appearance. Little to no collateral flow seen distally within the left MCA distribution. Right M1 patent to its distal aspect. Normal right MCA bifurcation. Distal right MCA branches well perfused. Posterior circulation: Multifocal scattered plaque within the dominant left multifocal stenosis. Patent left PICA. Scant attenuated flow within the right V4 segment as it courses into the cranial vault. Superimposed atheromatous plaque with moderate to severe stenosis within the right V4 segment prior to the takeoff of the right PICA. Right V4 is patent to the vertebrobasilar junction. Right PICA is perfused proximally. Basilar patent to its distal aspect without stenosis. Superior cerebral arteries patent proximally. Left PCA supplied via the basilar. Predominant fetal type origin of  the right PCA supplied via a patent right posterior communicating artery. PCAs patent to their distal aspects without hemodynamically significant stenosis. V4 segment with resultant mild Venous sinuses: Grossly patent, although not well evaluated due to arterial timing of the contrast bolus. Anatomic variants: Fetal type right PCA. Delayed phase: Not performed. Review of the MIP images confirms the above findings IMPRESSION: CT HEAD IMPRESSION 1. Evolving hypodensity involving the left insula and overlying supra ganglionic left cerebral hemisphere, evolving acute left MCA territory infarct. No intracranial hemorrhage. 2. Aspects = 7. 3. Underlying age-related cerebral atrophy with mild chronic small vessel ischemic disease. CTA HEAD AND NECK IMPRESSION 1. Positive study for emergent large vessel occlusion, with occlusion of the distal left M1 segment. Little to no collateral flow seen distally within the left MCA distribution. 2. Approximate 50% atheromatous stenosis at the proximal  left ICA. 3. Occluded vertebral artery within the neck, with irregular attenuated distal reconstitution just prior to the skull base. 4. Parotid carotid siphon atherosclerosis with associated moderate diffuse narrowing, right worse than left. 5. Small layering bilateral pleural effusions, partially visualized. These results were communicated to Dr. Laurence Slate At 9:15 pmon 01/02/2020by text page via the [facility] messaging system. Electronically Signed   By: Rise Mu M.D.   On: 01/21/2018 21:53   Ir Ct Head Ltd  Result Date: 2018/03/09 INDICATION: New onset of left gaze deviation, and right-sided weakness associated with aphasia. Occluded left middle cerebral artery distal M1 segment. EXAM: 1. EMERGENT LARGE VESSEL OCCLUSION THROMBOLYSIS (anterior CIRCULATION) COMPARISON:  CTA of the head and neck of 02/09/2018. MEDICATIONS: Ancef 2 g IV antibiotic was administered within 1 hour of the procedure. ANESTHESIA/SEDATION: General anesthesia. CONTRAST:  Isovue 300 approximately 40 mL. FLUOROSCOPY TIME:  Fluoroscopy Time: 21 minutes 0 seconds (105 mGy). COMPLICATIONS: None immediate. TECHNIQUE: Following a full explanation of the procedure along with the potential associated complications, an informed witnessed consent was obtained from the patient's spouse. The risks of intracranial hemorrhage of 10%, worsening neurological deficit, ventilator dependency, death and inability to revascularize were all reviewed in detail with the patient's spouse. The patient was then put under general anesthesia by the Department of Anesthesiology at [facility]. The right groin was prepped and draped in the usual sterile fashion. Thereafter using modified Seldinger technique, transfemoral access into the right common femoral artery was obtained without difficulty. Over a 0.035 inch guidewire a 5 French Pinnacle sheath was inserted. Through this, and also over a 0.035 inch guidewire a 5 [place] JB 1 catheter was advanced to the  aortic arch region and selectively positioned in the left common carotid artery. FINDINGS: The left common carotid arteriogram demonstrates the origin of the left external carotid artery to be widely patent. The branches are also intact. The left internal carotid artery at the bulb to the cranial skull base demonstrates wide patency. The petrous, cavernous and supraclinoid segments are widely patent. The left anterior cerebral artery opacifies into the capillary and venous phases. There is cross-filling via the anterior communicating artery of the right anterior cerebral artery A2 segment and distally. The left middle cerebral artery demonstrates occlusion in the distal M1 segment with angiographic evidence of early recanalization of the probable inferior division. A large hypoperfused and a perfused area is seen involving the left middle cerebral artery territory especially the subcortical areas. PROCEDURE: The diagnostic JB 1 catheter in the left common carotid artery was then exchanged over a 0.035 inch 300 cm Rosen exchange guidewire for an 8 [place] 55 cm  Brite tip neurovascular sheath using biplane roadmap technique and constant fluoroscopic guidance. Good aspiration was obtained from the hub of the neurovascular sheath. This was then connected to continuous heparinized saline infusion. Over the [employer] exchange guidewire, an 8 [place]French 85 cm FlowGate balloon guide catheter which had been prepped with 50% contrast and 50% heparinized saline infusion was advanced and positioned in the proximal left internal carotid artery. The guidewire was removed. Good aspiration was obtained from the hub of the [facility]. A gentle control arteriogram performed through the [facility] demonstrated continued occlusion of the left middle cerebral artery distal M1 segment. At this time, a combination of a 021 Trevo ProVue microcatheter inside of a 6 French 132 cm Catalyst guide catheter was advanced over a  0.014 inch Softip Synchro micro guidewire to the supraclinoid left ICA. With the micro guidewire leading with a J-tip configuration to avoid dissections or inducing spasm, access was obtained with the microcatheter into the left M1 segment. Thereafter, using a torque device, the micro guidewire was then gently manipulated through the occluded inferior division. There was moderate resistance encountered to the advancement of the micro guidewire. Eventually access was obtained distal to this through what appeared to be hard clot with possible associated atherosclerotic plaque. This was then followed by the microcatheter. A gentle control arteriogram performed through the microcatheter following removal of the micro guidewire demonstrated severe spasm with delayed exit of contrast in the distal distribution of the vessel. The microcatheter was then gently retrieved slightly more proximally until blood could be aspirated. The microcatheter was then connected to continuous heparinized saline infusion. A 5 mm x 33 mm Embotrap retrieval device was then advanced to the distal end of the microcatheter using biplane roadmap technique and constant fluoroscopic guidance. Once there, the distal 2/3 of the retrieval were then deployed by loosening the O ring on the delivery micro catheter. The microcatheter was then gently retrieved proximally. A very gentle control arteriogram performed through the 6 [place] Catalyst guide catheter in the left internal carotid artery supraclinoid segment demonstrated severe spasm in the inferior division of the left middle cerebral artery. Also noted was mild extravasation of contrast at the distal end of this vessel and the M3 M4 regions. The patient's blood pressure and heart rate remained stable. It was decided to recapture the retrieval device by advancing the microcatheter. This was done successfully. The combination once in the microcatheter was retrieved and removed. A gentle control  arteriogram performed through the Catalyst guide catheter in the left internal carotid artery demonstrated low free extravasation. This is probably due to severe spasm of the inferior division proximal to the previously noted extravasation. At this time, the 6 [place] Catalyst guide catheter was retrieved and removed. The [facility] guide [facility] was maintained in the left common carotid artery. A Dyna CT angiogram performed on the table demonstrated a mixture of cortical base subarachnoid contrast with probable hemorrhage extending from the perisylvian fissure and extending superiorly and posteriorly overlying the temporal lobe. Again no mass-effect or midline shift was seen. The ventricles appeared symmetrical and unchanged. It was decided to abort the revascularization procedure. In discussion with the referring neurologist, it was decided to obtain a CT scan in the CT scanner itself. Further management in regards to reversal of the Xarelto which the patient apparently was on will be considered. Hemodynamically and neurologically the patient remained stable. The patient was left intubated prior to being transferred to the CT scanner for CT scan of  the brain. IMPRESSION: Status post attempted endovascular revascularization of occluded left middle cerebral artery distally with 1 pass using the Solitaire FR 4 mm x 40 mm retrieval device aborted on account of intraprocedural extravasation of contrast as mentioned above. PLAN: Patient transferred to the CT scanner and then subsequently to the ICU to continue with further management. Also it was decided to keep the patient intubated and maintain the pressures between 110 mmHg systolic, and 140 mmHg systolic. Electronically Signed   By: Julieanne CottonSanjeev  Deveshwar M.D.   On: 02/05/2018 15:41   Dg Chest Port 1 View  Result Date: 02/07/2018 CLINICAL DATA:  Acute respiratory failure with hypoxia. PICC line. On ventilator. EXAM: PORTABLE CHEST 1 VIEW COMPARISON:  Chest x-rays  dated 02/06/2018 and 02/05/2018. FINDINGS: Stable cardiomegaly. Endotracheal tube is stable in position with tip at the level of the clavicles. LEFT-sided PICC line appears well positioned with tip at the level of the lower SVC/cavoatrial junction. Enteric tube passes below the diaphragm. Lungs are clear. No pneumothorax seen. IMPRESSION: 1. LEFT-sided PICC line appears well positioned with tip at the level of the lower SVC/cavoatrial junction. 2. Remainder of the support apparatus is stable in position. 3. Lungs are clear.  No pneumothorax. Electronically Signed   By: Bary RichardStan  Abram M.D.   On: 02/07/2018 07:22   Dg Chest Port 1 View  Result Date: 02/06/2018 CLINICAL DATA:  PICC line placement. EXAM: PORTABLE CHEST 1 VIEW COMPARISON:  02/05/2018 FINDINGS: Left-sided PICC line terminates in the expected location of the proximal right atrium. Endotracheal tube terminates 6.6 cm above the carina, unchanged from the prior radiograph. There remainder of the support apparatus is stable. Enlarged cardiac silhouette. There is no evidence of focal airspace consolidation, pleural effusion or pneumothorax. Osseous structures are without acute abnormality. Soft tissues are grossly normal. IMPRESSION: PICC line terminates in the expected location of the proximal right atrium. Endotracheal tube terminates 6.6 cm above the carina, unchanged from the prior radiograph. Advancement by 2-3 cm may be considered. Enlarged cardiac silhouette. No evidence of pulmonary edema. Electronically Signed   By: Ted Mcalpineobrinka  Dimitrova M.D.   On: 02/06/2018 18:53   Dg Chest Port 1 View  Result Date: 02/05/2018 CLINICAL DATA:  Intubation code stroke.  History of CHF. EXAM: PORTABLE CHEST 1 VIEW COMPARISON:  06/01/2017. FINDINGS: Endotracheal tube tip at the thoracic inlet approximately 6.6 cm above the carina. NG tube tip below left hemidiaphragm. AICD noted with lead over the midline again noted. No change in position. Prior CABG. Cardiomegaly.  Very mild interstitial prominence noted. No pleural effusion or pneumothorax. IMPRESSION: 1. Endotracheal tube tip at the thoracic inlet approximately 6.6 cm above the carina distal repositioning should be considered. NG tube tip below left hemidiaphragm. 2. AICD noted stable position with lead over the midline. Prior CABG. Stable cardiomegaly. Mild interstitial prominence noted. Mild CHF can not be excluded. Electronically Signed   By: Maisie Fushomas  Register   On: 02/05/2018 07:23   Ir Percutaneous Art Thrombectomy/infusion Intracranial Inc Diag Angio  Result Date: 10/25/2018 INDICATION: New onset of left gaze deviation, and right-sided weakness associated with aphasia. Occluded left middle cerebral artery distal M1 segment. EXAM: 1. EMERGENT LARGE VESSEL OCCLUSION THROMBOLYSIS (anterior CIRCULATION) COMPARISON:  CTA of the head and neck of 01/24/2018. MEDICATIONS: Ancef 2 g IV antibiotic was administered within 1 hour of the procedure. ANESTHESIA/SEDATION: General anesthesia. CONTRAST:  Isovue 300 approximately 40 mL. FLUOROSCOPY TIME:  Fluoroscopy Time: 21 minutes 0 seconds (105 mGy). COMPLICATIONS: None immediate. TECHNIQUE: Following a full  explanation of the procedure along with the potential associated complications, an informed witnessed consent was obtained from the patient's spouse. The risks of intracranial hemorrhage of 10%, worsening neurological deficit, ventilator dependency, death and inability to revascularize were all reviewed in detail with the patient's spouse. The patient was then put under general anesthesia by the Department of Anesthesiology at [facility]. The right groin was prepped and draped in the usual sterile fashion. Thereafter using modified Seldinger technique, transfemoral access into the right common femoral artery was obtained without difficulty. Over a 0.035 inch guidewire a 5 French Pinnacle sheath was inserted. Through this, and also over a 0.035 inch guidewire a 5  [place] JB 1 catheter was advanced to the aortic arch region and selectively positioned in the left common carotid artery. FINDINGS: The left common carotid arteriogram demonstrates the origin of the left external carotid artery to be widely patent. The branches are also intact. The left internal carotid artery at the bulb to the cranial skull base demonstrates wide patency. The petrous, cavernous and supraclinoid segments are widely patent. The left anterior cerebral artery opacifies into the capillary and venous phases. There is cross-filling via the anterior communicating artery of the right anterior cerebral artery A2 segment and distally. The left middle cerebral artery demonstrates occlusion in the distal M1 segment with angiographic evidence of early recanalization of the probable inferior division. A large hypoperfused and a perfused area is seen involving the left middle cerebral artery territory especially the subcortical areas. PROCEDURE: The diagnostic JB 1 catheter in the left common carotid artery was then exchanged over a 0.035 inch 300 cm Rosen exchange guidewire for an 8 French 55 cm Brite tip neurovascular sheath using biplane roadmap technique and constant fluoroscopic guidance. Good aspiration was obtained from the hub of the neurovascular sheath. This was then connected to continuous heparinized saline infusion. Over the [employer] guidewire, an 8 [place] 85 cm FlowGate balloon guide catheter which had been prepped with 50% contrast and 50% heparinized saline infusion was advanced and positioned in the proximal left internal carotid artery. The guidewire was removed. Good aspiration was obtained from the hub of the [facility] guide catheter. A gentle control arteriogram performed through the [facility] guide catheter demonstrated continued occlusion of the left middle cerebral artery distal M1 segment. At this time, a combination of a 021 Trevo ProVue microcatheter inside of a 6 French 132 cm  Catalyst guide catheter was advanced over a 0.014 inch Softip Synchro micro guidewire to the supraclinoid left ICA. With the micro guidewire leading with a J-tip configuration to avoid dissections or inducing spasm, access was obtained with the microcatheter into the left M1 segment. Thereafter, using a torque device, the micro guidewire was then gently manipulated through the occluded inferior division. There was moderate resistance encountered to the advancement of the micro guidewire. Eventually access was obtained distal to this through what appeared to be hard clot with possible associated atherosclerotic plaque. This was then followed by the microcatheter. A gentle control arteriogram performed through the microcatheter following removal of the micro guidewire demonstrated severe spasm with delayed exit of contrast in the distal distribution of the vessel. The microcatheter was then gently retrieved slightly more proximally until blood could be aspirated. The microcatheter was then connected to continuous heparinized saline infusion. A 5 mm x 33 mm Embotrap retrieval device was then advanced to the distal end of the microcatheter using biplane roadmap technique and constant fluoroscopic guidance. Once there, the distal 2/3 of the  retrieval were then deployed by loosening the O ring on the delivery micro catheter. The microcatheter was then gently retrieved proximally. A very gentle control arteriogram performed through the 6 [place] Catalyst guide catheter in the left internal carotid artery supraclinoid segment demonstrated severe spasm in the inferior division of the left middle cerebral artery. Also noted was mild extravasation of contrast at the distal end of this vessel and the M3 M4 regions. The patient's blood pressure and heart rate remained stable. It was decided to recapture the retrieval device by advancing the microcatheter. This was done successfully. The combination once in the microcatheter was  retrieved and removed. A gentle control arteriogram performed through the Catalyst guide catheter in the left internal carotid artery demonstrated low free extravasation. This is probably due to severe spasm of the inferior division proximal to the previously noted extravasation. At this time, the 6 [place] Catalyst guide catheter was retrieved and removed. The [facility] guide catheter was maintained in the left common carotid artery. A Dyna CT angiogram performed on the table demonstrated a mixture of cortical base subarachnoid contrast with probable hemorrhage extending from the perisylvian fissure and extending superiorly and posteriorly overlying the temporal lobe. Again no mass-effect or midline shift was seen. The ventricles appeared symmetrical and unchanged. It was decided to abort the revascularization procedure. In discussion with the referring neurologist, it was decided to obtain a CT scan in the CT scanner itself. Further management in regards to reversal of the Xarelto which the patient apparently was on will be considered. Hemodynamically and neurologically the patient remained stable. The patient was left intubated prior to being transferred to the CT scanner for CT scan of the brain. IMPRESSION: Status post attempted endovascular revascularization of occluded left middle cerebral artery distally with 1 pass using the Solitaire FR 4 mm x 40 mm retrieval device aborted on account of intraprocedural extravasation of contrast as mentioned above. PLAN: Patient transferred to the CT scanner and then subsequently to the ICU to continue with further management. Also it was decided to keep the patient intubated and maintain the pressures between 110 mmHg systolic, and 140 mmHg systolic. Electronically Signed   By: Julieanne Cotton M.D.   On: 02/05/2018 15:41   Ct Head Code Stroke Wo Contrast  Result Date: 01/30/2018 CLINICAL DATA:  Initial evaluation for acute right-sided facial droop. EXAM: CT  ANGIOGRAPHY HEAD AND NECK TECHNIQUE: Multidetector CT imaging of the head and neck was performed using the standard protocol during bolus administration of intravenous contrast. Multiplanar CT image reconstructions and MIPs were obtained to evaluate the vascular anatomy. Carotid stenosis measurements (when applicable) are obtained utilizing NASCET criteria, using the distal internal carotid diameter as the denominator. Multiphase CT imaging of the brain was performed following IV bolus contrast injection. Subsequent parametric perfusion maps were calculated using RAPID software. CONTRAST:  ISOVUE-370 IOPAMIDOL (ISOVUE-370) INJECTION 76% COMPARISON:  Prior CT from 08/13/2017 FINDINGS: CT HEAD FINDINGS Brain: Subtle evolving hypodensity within the left insula, adjacent left temporal lobe, and overlying supra ganglionic cortical gray matter, consistent with acute ischemic left MCA territory infarct. Sparing of the basal ganglia and internal capsule at this time. No acute intracranial hemorrhage. No mass lesion, midline shift or mass effect. No hydrocephalus. No extra-axial fluid collection. Atrophy with chronic microvascular disease noted. Vascular: Asymmetric hyperdensity within the distal left M1 segment, suspicious for possible large vessel occlusion. Calcified atherosclerosis at the skull base. Skull: Scalp soft tissues within normal limits.  Calvarium intact. Sinuses/Orbits: Globes and orbital  soft tissues within normal limits. Right frontal sinus retention cyst. Paranasal sinuses are otherwise largely clear. No mastoid effusion. Other: 5 ASPECTS (Alberta Stroke Program Early CT Score) - Ganglionic level infarction (caudate, lentiform nuclei, internal capsule, insula, M1-M3 cortex): 5 - Supraganglionic infarction (M4-M6 cortex): 2 Total score (0-10 with 10 being normal): 7 Review of the MIP images confirms the above findings CTA NECK FINDINGS Aortic arch: Visualized aortic arch of normal caliber with normal  branch pattern. Moderate atherosclerotic change about the arch and origin of the great vessels without hemodynamically significant stenosis. Visualized subclavian arteries patent. Right carotid system: Right common carotid artery patent from its origin to the bifurcation without stenosis. Scattered calcified plaque about the right bifurcation/proximal right ICA without hemodynamically significant stenosis. Right ICA widely patent distally to the skull base without stenosis, dissection, or occlusion. Left carotid system: Left common carotid artery patent from its origin to the bifurcation without hemodynamically significant stenosis. Scattered mixed plaque about the left bifurcation/proximal left ICA with resultant stenosis of up to 50% by NASCET criteria. Left ICA patent distally without additional stenosis, dissection, or occlusion. Vertebral arteries: Both of the vertebral arteries arise from the subclavian arteries. Focal plaque at the origin of the left vertebral artery with approximate mild-to-moderate stenosis (estimated 30-50%). Additional scattered plaque within the proximal left V2 segment with moderate stenosis (series 7, image 254). Left vertebral artery otherwise widely patent to the skull base. Right vertebral artery largely occluded within the neck. Scant distal reconstitution at the right V2/V3 segment via collateralization. Irregular attenuated flow seen within the right vert as it courses into the cranial vault. Skeleton: No acute osseous abnormality. No discrete lytic or blastic osseous lesions. Bulky anterior osteophytic spurring noted throughout the cervical spine. Other neck: No other acute soft tissue abnormality within the neck. Upper chest: Layering secretions noted within the subglottic trachea just above the carina. Small layering bilateral pleural effusions partially visualized. Associated scattered atelectatic changes noted within the right lung. Review of the MIP images confirms the  above findings CTA HEAD FINDINGS Anterior circulation: Petrous segments widely patent bilaterally. Heavy calcified plaque within the cavernous/supraclinoid ICAs bilaterally with up to moderate approximate 50% stenosis, right worse than left. ICA termini perfused. Left A1 patent. Right A1 hypoplastic and/or absent. Normal anterior communicating artery. Anterior cerebral arteries patent to their distal aspects without flow-limiting stenosis. Left M1 patent proximally. Abrupt occlusion of the distal left M1 near the left MCA bifurcation, acute in appearance. Little to no collateral flow seen distally within the left MCA distribution. Right M1 patent to its distal aspect. Normal right MCA bifurcation. Distal right MCA branches well perfused. Posterior circulation: Multifocal scattered plaque within the dominant left multifocal stenosis. Patent left PICA. Scant attenuated flow within the right V4 segment as it courses into the cranial vault. Superimposed atheromatous plaque with moderate to severe stenosis within the right V4 segment prior to the takeoff of the right PICA. Right V4 is patent to the vertebrobasilar junction. Right PICA is perfused proximally. Basilar patent to its distal aspect without stenosis. Superior cerebral arteries patent proximally. Left PCA supplied via the basilar. Predominant fetal type origin of the right PCA supplied via a patent right posterior communicating artery. PCAs patent to their distal aspects without hemodynamically significant stenosis. V4 segment with resultant mild Venous sinuses: Grossly patent, although not well evaluated due to arterial timing of the contrast bolus. Anatomic variants: Fetal type right PCA. Delayed phase: Not performed. Review of the MIP images confirms the above findings IMPRESSION:  CT HEAD IMPRESSION 1. Evolving hypodensity involving the left insula and overlying supra ganglionic left cerebral hemisphere, evolving acute left MCA territory infarct. No  intracranial hemorrhage. 2. Aspects = 7. 3. Underlying age-related cerebral atrophy with mild chronic small vessel ischemic disease. CTA HEAD AND NECK IMPRESSION 1. Positive study for emergent large vessel occlusion, with occlusion of the distal left M1 segment. Little to no collateral flow seen distally within the left MCA distribution. 2. Approximate 50% atheromatous stenosis at the proximal left ICA. 3. Occluded vertebral artery within the neck, with irregular attenuated distal reconstitution just prior to the skull base. 4. Parotid carotid siphon atherosclerosis with associated moderate diffuse narrowing, right worse than left. 5. Small layering bilateral pleural effusions, partially visualized. These results were communicated to Dr. Laurence Slate At 9:15 pmon Feb 03, 2020by text page via the [facility] messaging system. Electronically Signed   By: Rise Mu M.D.   On: 2018-02-15 21:53   [place] Ekg Site Rite  Result Date: 02/06/2018 If Site Rite image not attached, placement could not be confirmed due to current cardiac rhythm.   Microbiology Recent Results (from the past 240 hour(s))  MRSA PCR Screening     Status: Abnormal   Collection Time: 02/15/2018 11:35 PM  Result Value Ref Range Status   MRSA by PCR POSITIVE (A) NEGATIVE Final    Comment:        The GeneXpert MRSA Assay (FDA approved for NASAL specimens only), is one component of a comprehensive MRSA colonization surveillance program. It is not intended to diagnose MRSA infection nor to guide or monitor treatment for MRSA infections. RESULT CALLED TO, READ BACK BY AND VERIFIED WITHSharen Heck RN [id number] 02/05/2018 A BROWNING Performed at [facility], [address] [place], [place] [address]     Lab Basic Metabolic Panel: Recent Labs  Lab 2018/02/15 2045 February 15, 2018 2100 02/05/18 0452  02/05/18 1545  02/06/18 0408  02/06/18 1745  02/07/18 0439 02/07/18 1022 02/07/18 1719 02/07/18 2203 02/08/18 0508  NA 137  --  137   < >  142   < > 149*   < > 156*   < > 154* 155* 155* 157* 157*  K 4.4  --  4.2  --   --   --   --   --   --   --  3.9  --   --   --  3.6  CL 108  --  109  --   --   --   --   --   --   --  128*  --   --   --  130*  CO2 18*  --  19*  --   --   --   --   --   --   --  19*  --   --   --  24  GLUCOSE 117*  --  194*  --   --   --   --   --   --   --  131*  --   --   --  124*  BUN 21  --  23  --   --   --   --   --   --   --  27*  --   --   --  34*  CREATININE 1.36* 1.40* 1.40*  --   --   --   --   --   --   --  0.97  --   --   --  1.06  CALCIUM 9.3  --  8.8*  --   --   --   --   --   --   --  8.7*  --   --   --  9.0  MG  --   --   --   --  2.1  --  2.3  --  2.5*  --   --   --   --   --  2.4  PHOS  --   --   --   --  3.7  --  2.5  --  3.3  --   --   --   --   --   --    < > = values in this interval not displayed.   Liver Function Tests: Recent Labs  Lab 01/25/2018 2045  AST 22  ALT 15  ALKPHOS 57  BILITOT 1.9*  PROT 6.7  ALBUMIN 3.7   No results for input(s): LIPASE, AMYLASE in the last 168 hours. No results for input(s): AMMONIA in the last 168 hours. CBC: Recent Labs  Lab 01/31/2018 2045 02/05/18 0452 02/07/18 0439 02/08/18 0508  WBC 9.2 13.3* 7.0 6.8  NEUTROABS 7.0 12.4*  --   --   HGB 14.4 13.2 12.0* 12.2*  HCT 48.8 40.8 40.0 40.7  MCV 107.7* 102.5* 109.3* 110.6*  PLT 138* 135* 94* 105*   Cardiac Enzymes: No results for input(s): CKTOTAL, CKMB, CKMBINDEX, TROPONINI in the last 168 hours. Sepsis Labs: Recent Labs  Lab 01/25/2018 2045 02/05/18 0452 02/07/18 0439 02/08/18 0508  WBC 9.2 13.3* 7.0 6.8    Procedures/Operations   Thrombectomy   Marvel Plan March 09, 2018, 3:17 PM

## 2018-02-13 DEATH — deceased
